# Patient Record
Sex: Male | Born: 1955 | Race: White | Hispanic: No | Marital: Married | Smoking: Former smoker
Health system: Southern US, Community
[De-identification: ages and names within clinical notes are randomized; demographics above are authoritative.]

## PROBLEM LIST (undated history)

## (undated) DIAGNOSIS — G4730 Sleep apnea, unspecified: Secondary | ICD-10-CM

## (undated) DIAGNOSIS — I10 Essential (primary) hypertension: Secondary | ICD-10-CM

## (undated) DIAGNOSIS — I2699 Other pulmonary embolism without acute cor pulmonale: Secondary | ICD-10-CM

## (undated) DIAGNOSIS — N289 Disorder of kidney and ureter, unspecified: Secondary | ICD-10-CM

## (undated) DIAGNOSIS — E785 Hyperlipidemia, unspecified: Secondary | ICD-10-CM

## (undated) DIAGNOSIS — K635 Polyp of colon: Secondary | ICD-10-CM

## (undated) DIAGNOSIS — I639 Cerebral infarction, unspecified: Secondary | ICD-10-CM

## (undated) DIAGNOSIS — I314 Cardiac tamponade: Secondary | ICD-10-CM

## (undated) DIAGNOSIS — I4510 Unspecified right bundle-branch block: Secondary | ICD-10-CM

## (undated) DIAGNOSIS — I7100 Dissection of unspecified site of aorta: Secondary | ICD-10-CM

## (undated) DIAGNOSIS — R739 Hyperglycemia, unspecified: Secondary | ICD-10-CM

## (undated) HISTORY — DX: Polyp of colon: K63.5

## (undated) HISTORY — PX: KNEE SURGERY: SHX244

## (undated) HISTORY — PX: COLONOSCOPY: SHX174

## (undated) HISTORY — DX: Cardiac tamponade: I31.4

## (undated) HISTORY — DX: Sleep apnea, unspecified: G47.30

## (undated) HISTORY — DX: Disorder of kidney and ureter, unspecified: N28.9

## (undated) HISTORY — DX: Essential (primary) hypertension: I10

## (undated) HISTORY — PX: POLYPECTOMY: SHX149

## (undated) HISTORY — PX: WISDOM TOOTH EXTRACTION: SHX21

## (undated) HISTORY — PX: CARPAL TUNNEL RELEASE: SHX101

## (undated) HISTORY — DX: Hyperglycemia, unspecified: R73.9

## (undated) HISTORY — DX: Hyperlipidemia, unspecified: E78.5

## (undated) HISTORY — DX: Cerebral infarction, unspecified: I63.9

---

## 1979-02-06 HISTORY — PX: LIGAMENT REPAIR: SHX5444

## 1998-04-29 ENCOUNTER — Other Ambulatory Visit: Admission: RE | Admit: 1998-04-29 | Discharge: 1998-04-29 | Payer: Self-pay | Admitting: Urology

## 1998-10-25 ENCOUNTER — Encounter: Payer: Self-pay | Admitting: Family Medicine

## 1998-10-25 LAB — CONVERTED CEMR LAB: Blood Glucose, Fasting: 104 mg/dL

## 2001-02-14 ENCOUNTER — Encounter: Payer: Self-pay | Admitting: Family Medicine

## 2001-02-14 LAB — CONVERTED CEMR LAB: TSH: 1.16 microintl units/mL

## 2002-02-05 HISTORY — PX: CARPAL TUNNEL RELEASE: SHX101

## 2002-08-14 ENCOUNTER — Encounter: Payer: Self-pay | Admitting: Family Medicine

## 2003-08-20 ENCOUNTER — Encounter: Payer: Self-pay | Admitting: Family Medicine

## 2003-08-21 ENCOUNTER — Encounter: Payer: Self-pay | Admitting: Family Medicine

## 2005-02-06 ENCOUNTER — Ambulatory Visit: Payer: Self-pay | Admitting: Family Medicine

## 2005-02-06 LAB — CONVERTED CEMR LAB
Blood Glucose, Fasting: 119 mg/dL
RBC count: 4.95 10*6/uL
WBC, blood: 7.9 10*3/uL

## 2005-02-09 ENCOUNTER — Ambulatory Visit: Payer: Self-pay | Admitting: Family Medicine

## 2007-02-03 ENCOUNTER — Ambulatory Visit: Payer: Self-pay | Admitting: Family Medicine

## 2007-03-28 ENCOUNTER — Ambulatory Visit: Payer: Self-pay | Admitting: Family Medicine

## 2007-03-28 LAB — CONVERTED CEMR LAB
Calcium: 9.5 mg/dL (ref 8.4–10.5)
Chloride: 104 meq/L (ref 96–112)
Creatinine, Ser: 1.2 mg/dL (ref 0.4–1.5)
GFR calc non Af Amer: 68 mL/min
Glucose, Bld: 98 mg/dL (ref 70–99)

## 2007-03-31 LAB — CONVERTED CEMR LAB
BUN: 13 mg/dL (ref 6–23)
CO2: 31 meq/L (ref 19–32)
GFR calc Af Amer: 83 mL/min
GFR calc non Af Amer: 68 mL/min
Glucose, Bld: 98 mg/dL (ref 70–99)
LDL Cholesterol: 102 mg/dL — ABNORMAL HIGH (ref 0–99)
Potassium: 4.6 meq/L (ref 3.5–5.1)
Total CHOL/HDL Ratio: 3.4
Triglycerides: 95 mg/dL (ref 0–149)

## 2007-04-01 ENCOUNTER — Ambulatory Visit: Payer: Self-pay | Admitting: Family Medicine

## 2007-05-01 ENCOUNTER — Telehealth: Payer: Self-pay | Admitting: Family Medicine

## 2007-06-09 ENCOUNTER — Ambulatory Visit: Payer: Self-pay | Admitting: Family Medicine

## 2007-06-09 LAB — CONVERTED CEMR LAB
ALT: 38 units/L (ref 0–53)
AST: 35 units/L (ref 0–37)
Bilirubin, Direct: 0.1 mg/dL (ref 0.0–0.3)
CO2: 30 meq/L (ref 19–32)
Chloride: 105 meq/L (ref 96–112)
Sodium: 139 meq/L (ref 135–145)
Total Bilirubin: 1.2 mg/dL (ref 0.3–1.2)
Total CHOL/HDL Ratio: 3.5
Triglycerides: 77 mg/dL (ref 0–149)

## 2007-06-11 ENCOUNTER — Ambulatory Visit: Payer: Self-pay | Admitting: Family Medicine

## 2007-06-11 ENCOUNTER — Encounter: Payer: Self-pay | Admitting: Family Medicine

## 2007-06-11 DIAGNOSIS — E785 Hyperlipidemia, unspecified: Secondary | ICD-10-CM

## 2007-06-11 DIAGNOSIS — L408 Other psoriasis: Secondary | ICD-10-CM | POA: Insufficient documentation

## 2007-06-11 DIAGNOSIS — R739 Hyperglycemia, unspecified: Secondary | ICD-10-CM

## 2007-06-11 DIAGNOSIS — I119 Hypertensive heart disease without heart failure: Secondary | ICD-10-CM

## 2008-03-29 ENCOUNTER — Ambulatory Visit: Payer: Self-pay | Admitting: Family Medicine

## 2008-05-11 ENCOUNTER — Ambulatory Visit: Payer: Self-pay | Admitting: Family Medicine

## 2008-07-06 ENCOUNTER — Ambulatory Visit: Payer: Self-pay | Admitting: Family Medicine

## 2008-07-06 LAB — CONVERTED CEMR LAB
BUN: 11 mg/dL (ref 6–23)
CO2: 30 meq/L (ref 19–32)
Chloride: 108 meq/L (ref 96–112)
Glucose, Bld: 118 mg/dL — ABNORMAL HIGH (ref 70–99)
Hgb A1c MFr Bld: 5.6 % (ref 4.6–6.5)
Potassium: 4.2 meq/L (ref 3.5–5.1)

## 2008-07-08 ENCOUNTER — Ambulatory Visit: Payer: Self-pay | Admitting: Family Medicine

## 2008-07-08 ENCOUNTER — Telehealth: Payer: Self-pay | Admitting: Family Medicine

## 2008-10-06 ENCOUNTER — Ambulatory Visit: Payer: Self-pay | Admitting: Family Medicine

## 2008-10-06 LAB — CONVERTED CEMR LAB
BUN: 13 mg/dL (ref 6–23)
Bilirubin, Direct: 0.1 mg/dL (ref 0.0–0.3)
Calcium: 9.3 mg/dL (ref 8.4–10.5)
Creatinine, Ser: 1.3 mg/dL (ref 0.4–1.5)
HDL: 43.1 mg/dL (ref >39.00)
LDL Cholesterol: 124 mg/dL — ABNORMAL HIGH (ref 0–99)
Microalb Creat Ratio: 8.5 mg/g (ref 0.0–30.0)
Microalb, Ur: 1.2 mg/dL (ref 0.0–1.9)
PSA: 0.74 ng/mL (ref 0.10–4.00)
TSH: 1.26 microintl units/mL (ref 0.35–5.50)
Total Bilirubin: 0.9 mg/dL (ref 0.3–1.2)
Total CHOL/HDL Ratio: 4
Total Protein: 7.1 g/dL (ref 6.0–8.3)
Triglycerides: 96 mg/dL (ref 0.0–149.0)

## 2008-10-12 ENCOUNTER — Ambulatory Visit: Payer: Self-pay | Admitting: Family Medicine

## 2009-04-12 ENCOUNTER — Ambulatory Visit: Payer: Self-pay | Admitting: Family Medicine

## 2009-09-13 ENCOUNTER — Encounter (INDEPENDENT_AMBULATORY_CARE_PROVIDER_SITE_OTHER): Payer: Self-pay | Admitting: *Deleted

## 2009-12-20 ENCOUNTER — Encounter: Payer: Self-pay | Admitting: Family Medicine

## 2010-03-07 NOTE — Letter (Signed)
Summary: Nadara Eaton letter  [facility]  [address] [place], [place] [address]   Phone: [phone]  Fax: [phone]       09/13/2009 MRN: [id number]  [facility] [address] [place], [place]  [address]  Dear Mr. Carnella [facility] [place], and [facility] announce the retirement of Arta Silence, M.D., from full-time practice at the [facility] office effective August 04, 2009 and his plans of returning part-time.  It is important to Dr. Hetty Ely and to our practice that you understand that [facility] has seven physicians in our office for your health care needs.  We will continue to offer the same exceptional care that you have today.    Dr. Hetty Ely has spoken to many of you about his plans for retirement and returning part-time in the fall.   We will continue to work with you through the transition to schedule appointments for you in the office and meet the high standards that [facility] is committed to.   Again, it is with great pleasure that we share the news that Dr. Hetty Ely will return to [facility] in October of 2011 with a reduced schedule.    If you have any questions, or would like to request an appointment with one of our physicians, please call us at [phone] and press the option for Scheduling an appointment.  We take pleasure in providing you with excellent patient care and look forward to seeing you at your next office visit.  Our [facility] Physicians are:  Tillman Abide, M.D. Laurita Quint, M.D. Roxy Manns, M.D. Kerby Nora, M.D. Hannah Beat, M.D. Ruthe Mannan, M.D. We proudly welcomed Raechel Ache, M.D. and Eustaquio Boyden, M.D. to the practice in July/August 2011.  Sincerely,  [facility]

## 2010-03-07 NOTE — Miscellaneous (Signed)
Summary: Flu vaccine   Clinical Lists Changes  Observations: Added new observation of FLU VAX: Historical (12/16/2009 8:48)      Influenza Immunization History:    Influenza # 1:  Historical (12/16/2009) Received form from [employer], [address] [place], [place]

## 2010-03-20 ENCOUNTER — Other Ambulatory Visit (INDEPENDENT_AMBULATORY_CARE_PROVIDER_SITE_OTHER): Payer: Self-pay

## 2010-03-20 ENCOUNTER — Encounter: Payer: Self-pay | Admitting: Family Medicine

## 2010-03-20 ENCOUNTER — Other Ambulatory Visit: Payer: Self-pay | Admitting: Family Medicine

## 2010-03-20 ENCOUNTER — Encounter (INDEPENDENT_AMBULATORY_CARE_PROVIDER_SITE_OTHER): Payer: Self-pay | Admitting: *Deleted

## 2010-03-20 DIAGNOSIS — Z125 Encounter for screening for malignant neoplasm of prostate: Secondary | ICD-10-CM

## 2010-03-20 DIAGNOSIS — R7309 Other abnormal glucose: Secondary | ICD-10-CM

## 2010-03-20 DIAGNOSIS — E785 Hyperlipidemia, unspecified: Secondary | ICD-10-CM

## 2010-03-20 DIAGNOSIS — I10 Essential (primary) hypertension: Secondary | ICD-10-CM

## 2010-03-20 DIAGNOSIS — M1710 Unilateral primary osteoarthritis, unspecified knee: Secondary | ICD-10-CM

## 2010-03-20 DIAGNOSIS — L408 Other psoriasis: Secondary | ICD-10-CM

## 2010-03-20 DIAGNOSIS — Z0000 Encounter for general adult medical examination without abnormal findings: Secondary | ICD-10-CM

## 2010-03-20 LAB — BASIC METABOLIC PANEL
Calcium: 9.7 mg/dL (ref 8.4–10.5)
Creatinine, Ser: 1.2 mg/dL (ref 0.4–1.5)
GFR: 68.78 mL/min (ref >60.00)
Glucose, Bld: 113 mg/dL — ABNORMAL HIGH (ref 70–99)
Sodium: 139 mEq/L (ref 135–145)

## 2010-03-20 LAB — MICROALBUMIN / CREATININE URINE RATIO: Microalb, Ur: 8 mg/dL — ABNORMAL HIGH (ref 0.0–1.9)

## 2010-03-20 LAB — LIPID PANEL
HDL: 50.1 mg/dL (ref >39.00)
LDL Cholesterol: 127 mg/dL — ABNORMAL HIGH (ref 0–99)
Total CHOL/HDL Ratio: 4
Triglycerides: 106 mg/dL (ref 0.0–149.0)
VLDL: 21.2 mg/dL (ref 0.0–40.0)

## 2010-03-20 LAB — CBC WITH DIFFERENTIAL/PLATELET
Basophils Absolute: 0 10*3/uL (ref 0.0–0.1)
Eosinophils Absolute: 0.2 10*3/uL (ref 0.0–0.7)
Hemoglobin: 14.6 g/dL (ref 13.0–17.0)
Lymphocytes Relative: 24 % (ref 12.0–46.0)
MCHC: 34.7 g/dL (ref 30.0–36.0)
Monocytes Relative: 7.2 % (ref 3.0–12.0)
Neutro Abs: 4.3 10*3/uL (ref 1.4–7.7)
Neutrophils Relative %: 64.8 % (ref 43.0–77.0)
Platelets: 191 10*3/uL (ref 150.0–400.0)
RDW: 13.1 % (ref 11.5–14.6)

## 2010-03-20 LAB — HEPATIC FUNCTION PANEL
AST: 22 U/L (ref 0–37)
Alkaline Phosphatase: 54 U/L (ref 39–117)
Bilirubin, Direct: 0.1 mg/dL (ref 0.0–0.3)
Total Bilirubin: 0.7 mg/dL (ref 0.3–1.2)

## 2010-03-21 LAB — CONVERTED CEMR LAB: Vit D, 25-Hydroxy: 34 ng/mL (ref 30–89)

## 2010-03-22 ENCOUNTER — Encounter: Payer: Self-pay | Admitting: Family Medicine

## 2010-03-22 ENCOUNTER — Encounter (INDEPENDENT_AMBULATORY_CARE_PROVIDER_SITE_OTHER): Payer: Managed Care, Other (non HMO) | Admitting: Family Medicine

## 2010-03-22 ENCOUNTER — Encounter (INDEPENDENT_AMBULATORY_CARE_PROVIDER_SITE_OTHER): Payer: Self-pay | Admitting: *Deleted

## 2010-03-22 DIAGNOSIS — Z0000 Encounter for general adult medical examination without abnormal findings: Secondary | ICD-10-CM

## 2010-03-29 NOTE — Assessment & Plan Note (Signed)
Summary: CPX/ CLE   Vital Signs:  Patient profile:   55 year old male Weight:      230.75 pounds BMI:     36.00 Temp:     98.8 degrees F oral Pulse rate:   64 / minute Pulse rhythm:   regular BP sitting:   144 / 84  (left arm) Cuff size:   large  Vitals Entered By: Sydell Axon LPN (March 22, 2010 11:11 AM) CC: 30 minute checkup, wants to discuss getting a colonoscopy, Headache   History of Present Illness: Pt here for Comp Exam, feels well with no complaint.  Libido dropping off. He is good in the AM, not so good at night. his brother was recently diagnosed with polyps of the colon. He started weight watcher's at the bgiining of the year and had lost weight but gained it back. He also has heartburn once or twice a week, especially with bending over. He takes Co Q 10, Fishoil and red yeast rice. The day before his bloodwork, he had had poirk 2 days in a row.    Preventive Screening-Counseling & Management  Alcohol-Tobacco     Alcohol drinks/day: 1 average but typically every 2-3 days.     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 1 Tablet  Twice A Day By Mouth 2)  Amlodipine Besylate 10 Mg Tabs (Amlodipine Besylate) .... Take 1/2 Tablet By Mouth At Night 3)  Diprolene 0.05 % Oint (Betamethasone Dipropionate Aug) .... Apply To Area Two Times A Day As Needed For Psoriasis  Current Medications (verified): 1)  Metoprolol Tartrate 100 Mg Tabs (Metoprolol Tartrate) .Marland Kitchen.. 1 Tablet Twice A Day By Mouth 2)  Amlodipine Besylate 10 Mg Tabs (Amlodipine Besylate) .... Take 1/2 Tablet By Mouth At Night 3)  Diprolene 0.05 % Oint (Betamethasone Dipropionate Aug) .... Apply To Area Two Times A Day As Needed For Psoriasis 4)  Cialis 10 Mg Tabs (Tadalafil) .... One Tab By Mouth Max of Once A Day For Erectile Dyfunction  Allergies: No Known Drug Allergies  Family History: Father dec 67 Leukemia AML CABG HTN Mother A 58  Parkinsons Brother A 54 HTN CABG Polyps Brother A 51  HTN Brother A 48 HTN Sister A 46 Meniere's CV: +PGF DECEASED FROM MI // FATHER CABG ANGIOPLASTY HBP: + FATHER, BROTHER, SELF DM : NEGATIVE GOUT/ARTHRITIS  PROSTATE CANCER: +LEUKEMIA FATHER BREAST/OVARIAN/UTERINE CANCER: NEGATIVE COLON CANCER: None, polyps in brother DEPRESSION:NEGATIVE ETOH/DRUG ABUSE: NEGATIVE OTHER : +STROKE PGM  Social History: Reviewed history from 06/11/2007 and no changes required.  Occupation:[employer] Married lives with wife  2 stepchildren Does Patient Exercise:  no  Review of Systems General:  Denies chills, fatigue, fever, sweats, weakness, and weight loss. Eyes:  Denies blurring, discharge, and eye pain. ENT:  Denies decreased hearing, ear discharge, earache, and ringing in ears. CV:  Complains of swelling of feet; denies chest pain or discomfort, fainting, fatigue, palpitations, shortness of breath with exertion, and swelling of hands; occas, better since cutting back on Amlodipine.Marland Kitchen Resp:  Denies cough, shortness of breath, and wheezing. GI:  Complains of indigestion; denies abdominal pain, bloody stools, change in bowel  habits, constipation, dark tarry stools, diarrhea, loss of appetite, nausea, vomiting, vomiting blood, and yellowish skin color; occas, per HPI. GU:  Denies discharge, dysuria, nocturia, and urinary frequency. MS:  Complains of joint pain; denies low back pain, muscle aches, cramps, and stiffness. Derm:  Denies dryness, itching, and rash; psoriasis better.. Neuro:  Denies numbness, poor balance, tingling, and tremors.  Physical Exam  General:  Well-developed,well-nourished,in no acute distress; alert,appropriate and cooperative throughout examination, mildly obese. Head:  Normocephalic and atraumatic without obvious abnormalities. No apparent alopecia, but has male pattern  balding. Eyes:  Conjunctiva clear bilaterally.  Ears:  External ear exam shows no significant lesions or deformities.  Otoscopic examination reveals clear canals, tympanic membranes are intact bilaterally without bulging, retraction, inflammation or discharge. Hearing is grossly normal bilaterally. Nose:  External nasal examination shows no deformity or inflammation. Nasal mucosa are pink and moist without lesions or exudates. Mouth:  Oral mucosa and oropharynx without lesions or exudates.  Teeth in good repair. Neck:  No deformities, masses, or tenderness noted. Chest Wall:  No deformities, masses, tenderness or gynecomastia noted. Breasts:  No masses or gynecomastia noted Lungs:  Normal respiratory effort, chest expands symmetrically. Lungs are clear to auscultation, no crackles or wheezes. Heart:  Normal rate and regular rhythm. S1 and S2 normal without gallop, murmur, click, rub or other extra sounds. Abdomen:  Bowel sounds positive,abdomen soft and non-tender without masses, organomegaly or hernias noted. Rectal:  No external abnormalities noted. Normal sphincter tone. No rectal masses or tenderness. G neg. Genitalia:  Testes bilaterally descended without nodularity, tenderness or masses. No scrotal masses or lesions. No  penis lesions or urethral discharge. Prostate:  Prostate gland firm and smooth, no enlargement, nodularity, tenderness, mass, asymmetry or induration. 10 gms. Msk:  No deformity or scoliosis noted of thoracic or lumbar spine.   Pulses:  R and L carotid,radial,femoral,dorsalis pedis and posterior tibial pulses are full and equal bilaterally Extremities:  No clubbing, cyanosis, edema, or deformity noted with normal full range of motion of all joints. Neurologic:  No cranial nerve deficits noted. Station and gait are normal. Sensory, motor and coordinative functions appear intact. Skin:  Intact without suspicious lesions or rashes Cervical Nodes:  No lymphadenopathy noted Inguinal Nodes:  No significant adenopathy Psych:  Cognition and judgment appear intact. Alert and cooperative with normal attention span and concentration. No apparent delusions, illusions, hallucinations   Impression & Recommendations:  Problem # 1:  HEALTH MAINTENANCE EXAM (ICD-V70.0) Assessment Comment Only  Orders: Gastroenterology Referral (GI)  Reviewed preventive care protocols, scheduled due services, and updated immunizations.  Problem # 2:  SPECIAL SCREENING MALIGNANT NEOPLASM OF PROSTATE (ICD-V76.44) Assessment: Unchanged Stable PSA and exam.  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  Problem # 4:  HYPERGLYCEMIA (ICD-790.29) Assessment: Unchanged Slightly higher but still controlled. Discussed avoiding sweets and carbs to avoid DM.  Weight loss will prob normalize if enough lost. Labs Reviewed: Creat: 1.2 (03/20/2010)     Problem # 5:  HYPERLIPIDEMIA (ICD-272.4) Assessment: Unchanged Stable. Labs Reviewed: SGOT: 22 (03/20/2010)   SGPT: 38 (03/20/2010)   HDL:50.10 (03/20/2010), 43.10 (10/06/2008)  LDL:127 (03/20/2010), 124 (10/06/2008)  Chol:198 (03/20/2010), 186 (10/06/2008)  Trig:106.0 (03/20/2010), 96.0 (10/06/2008)  Problem # 6:  PSORIASIS  (ICD-696.1) Assessment: Improved Better with Fish Oil.  Problem # 7:  ESSENTIAL HYPERTENSION, BENIGN (ICD-401.1) Assessment: Deteriorated Slightly high today....weight loss will normalize. See me in 3 mos if not successful at losing weight for BP check. His updated medication list for this problem includes:    Metoprolol Tartrate 100 Mg Tabs (Metoprolol tartrate) .Marland Kitchen... 1 tablet twice a day by mouth    Amlodipine Besylate 10 Mg Tabs (Amlodipine besylate) .Marland Kitchen... Take 1/2 tablet by mouth at night  BP today: 144/84 Prior BP: 130/78 (10/12/2008)  Labs Reviewed: K+: 4.4 (03/20/2010) Creat: : 1.2 (03/20/2010)   Chol: 198 (03/20/2010)   HDL: 50.10 (03/20/2010)   LDL: 127 (03/20/2010)   TG: 106.0 (03/20/2010)  Complete Medication List: 1)  Metoprolol Tartrate 100 Mg Tabs (Metoprolol tartrate) .Marland Kitchen.. 1 tablet twice a day by mouth 2)  Amlodipine Besylate 10 Mg Tabs (Amlodipine besylate) .... Take 1/2 tablet by mouth at night 3)  Diprolene 0.05 % Oint (Betamethasone dipropionate aug) .... Apply to area two times a day as needed for psoriasis 4)  Cialis 10 Mg Tabs (Tadalafil) .... One tab by mouth max of once a day for erectile dyfunction  Patient Instructions: 1)  Refer for colonoscopy. 2)  Use jogging in a jug.  3)  RTC for BP check if not successful at losing weight. Prescriptions: CIALIS 10 MG TABS (TADALAFIL) one tab by mouth max of once a day for erectile dyfunction  #30 x 0   Entered and Authorized by:   Shaune Leeks MD   Signed by:   Shaune Leeks MD on 03/22/2010   Method used:   Electronically to        [facility]  [address] # [id number]* (retail)       [address]       [place], [place]  [address]       Ph: [phone] or [phone]       Fax: [phone]   RxID:   [id number]    Orders Added: 1)  Gastroenterology Referral [GI] 2)  Est. Patient 40-64 years [id number]

## 2010-03-29 NOTE — Letter (Signed)
Summary: Pre Visit Letter Revised  [facility]  [address] [place], [place] [address]   Phone: [phone]  Fax: [phone]        03/22/2010 MRN: [id number] [facility] [address] [place], [place]  [address]  [place]             Procedure Date:  May 04, 2010   dir col-Dr Dalene Carrow to the Gastroenterology Division at [facility].    You are scheduled to see a nurse for your pre-procedure visit on April 21, 2010 at 3:30pm on the 3rd floor at [facility], [address]  We ask that you try to arrive at our office 15 minutes prior to your appointment time to allow for check-in.  Please take a minute to review the attached form.  If you answer "Yes" to one or more of the questions on the first page, we ask that you call the person listed at your earliest opportunity.  If you answer "No" to all of the questions, please complete the rest of the form and bring it to your appointment.    Your nurse visit will consist of discussing your medical and surgical history, your immediate family medical history, and your medications.   If you are unable to list all of your medications on the form, please bring the medication bottles to your appointment and we will list them.  We will need to be aware of both prescribed and over the counter drugs.  We will need to know exact dosage information as well.    Please be prepared to read and sign documents such as consent forms, a financial agreement, and acknowledgement forms.  If necessary, and with your consent, a friend or relative is welcome to sit-in on the nurse visit with you.  Please bring your insurance card so that we may make a copy of it.  If your insurance requires a referral to see a specialist, please bring your referral form from your primary care physician.  No co-pay is required for this nurse visit.     If you cannot keep your appointment, please call [phone] to cancel or reschedule  prior to your appointment date.  This allows [place] the opportunity to schedule an appointment for another patient in need of care.    Thank you for choosing [facility] for your medical needs.  We appreciate the opportunity to care for you.  Please visit [place] at our website  to learn more about our practice.  Sincerely, The Gastroenterology Division

## 2010-04-19 ENCOUNTER — Encounter: Payer: Self-pay | Admitting: *Deleted

## 2010-04-21 ENCOUNTER — Encounter: Payer: Self-pay | Admitting: Internal Medicine

## 2010-04-25 NOTE — Miscellaneous (Signed)
Summary: [facility]  Clinical Lists Changes  Medications: Added new medication of MOVIPREP 100 GM  SOLR (PEG-KCL-NACL-NASULF-NA ASC-C) As per prep instructions. - Signed Rx of MOVIPREP 100 GM  SOLR (PEG-KCL-NACL-NASULF-NA ASC-C) As per prep instructions.;  #1 x 0;  Signed;  Entered by: Ezra Sites RN;  Authorized by: Hart Carwin MD;  Method used: Electronically to [facility]  [address] # [id number]*, [address] [place], [place], [place]  [address], Ph: [phone] or [phone], Fax: [phone] Observations: Added new observation of NKA: T (04/21/2010 15:20)    Prescriptions: MOVIPREP 100 GM  SOLR (PEG-KCL-NACL-NASULF-NA ASC-C) As per prep instructions.  #1 x 0   Entered by:   Ezra Sites RN   Authorized by:   Hart Carwin MD   Signed by:   Ezra Sites RN on 04/21/2010   Method used:   Electronically to        [facility] [address] # [id number]* (retail)       [address]       [place], [place]  [address]       Ph: [phone] or [phone]       Fax: [phone]   RxID:   [id number]

## 2010-04-25 NOTE — Letter (Signed)
Summary: [facility] Instructions  [facility]  [address] [place], [place] [address]   Phone: [phone]  Fax: [phone]       Gabriel Tran    1955/08/11    MRN: [id number]        Procedure Day /Date: Thursday 05/04/10     Arrival Time: 10:00 am      Procedure Time:  11:00 am     Location of Procedure:                    _X_  [facility] (4th Floor)  PREPARATION FOR COLONOSCOPY WITH MOVIPREP   Starting 5 days prior to your procedure _Saturday 04/29/10 _ do not eat nuts, seeds, popcorn, corn, beans, peas,  salads, or any raw vegetables.  Do not take any fiber supplements (e.g. Metamucil, Citrucel, and Benefiber).  THE DAY BEFORE YOUR PROCEDURE         DATE: _3/28/12  DAY: _Wednesday  _  1.  Drink clear liquids the entire day-NO SOLID FOOD  2.  Do not drink anything colored red or purple.  Avoid juices with pulp.  No orange juice.  3.  Drink at least 64 oz. (8 glasses) of fluid/clear liquids during the day to prevent dehydration and help the prep work efficiently.  CLEAR LIQUIDS INCLUDE: Water Jello Ice Popsicles Tea (sugar ok, no milk/cream) Powdered fruit flavored drinks Coffee (sugar ok, no milk/cream) Gatorade Juice: apple, white grape, white cranberry  Lemonade Clear bullion, consomm, broth Carbonated beverages (any kind) Strained chicken noodle soup Hard Candy                             4.  In the morning, mix first dose of MoviPrep solution:    Empty 1 Pouch A and 1 Pouch B into the disposable container    Add lukewarm drinking water to the top line of the container. Mix to dissolve    Refrigerate (mixed solution should be used within 24 hrs)  5.  Begin drinking the prep at 5:00 p.m. The MoviPrep container is divided by 4 marks.   Every 15 minutes drink the solution down to the next mark (approximately 8 oz) until the full liter is complete.   6.  Follow completed prep with 16 oz of clear liquid of your choice  (Nothing red or purple).  Continue to drink clear liquids until bedtime.  7.  Before going to bed, mix second dose of MoviPrep solution:    Empty 1 Pouch A and 1 Pouch B into the disposable container    Add lukewarm drinking water to the top line of the container. Mix to dissolve    Refrigerate  THE DAY OF YOUR PROCEDURE      DATE: _3/29/12  _ DAY: _Thursday  _  Beginning at _6:00  a.m. (5 hours before procedure):         1. Every 15 minutes, drink the solution down to the next mark (approx 8 oz) until the full liter is complete.  2. Follow completed prep with 16 oz. of clear liquid of your choice.    3. You may drink clear liquids until 9:00 am  (2 HOURS BEFORE PROCEDURE).   MEDICATION INSTRUCTIONS  Unless otherwise instructed, you should take regular prescription medications with a small sip of water   as early as possible the morning of your procedure.  OTHER INSTRUCTIONS  You will need a responsible adult at least 53 years of age to accompany you and drive you home.   This person must remain in the waiting room during your procedure.  Wear loose fitting clothing that is easily removed.  Leave jewelry and other valuables at home.  However, you may wish to bring a book to read or  an iPod/MP3 player to listen to music as you wait for your procedure to start.  Remove all body piercing jewelry and leave at home.  Total time from sign-in until discharge is approximately 2-3 hours.  You should go home directly after your procedure and rest.  You can resume normal activities the  day after your procedure.  The day of your procedure you should not:   Drive   Make legal decisions   Operate machinery   Drink alcohol   Return to work  You will receive specific instructions about eating, activities and medications before you leave.    The above instructions have been reviewed and explained to me by   Ezra Sites RN  April 21, 2010 3:43 PM     I  fully understand and can verbalize these instructions _____________________________ Date _________

## 2010-05-03 ENCOUNTER — Encounter: Payer: Self-pay | Admitting: Internal Medicine

## 2010-05-04 ENCOUNTER — Ambulatory Visit (AMBULATORY_SURGERY_CENTER): Payer: Managed Care, Other (non HMO) | Admitting: Internal Medicine

## 2010-05-04 ENCOUNTER — Encounter: Payer: Self-pay | Admitting: Internal Medicine

## 2010-05-04 VITALS — BP 148/96 | HR 56 | Temp 98.0°F | Resp 18 | Ht 67.0 in | Wt 222.0 lb

## 2010-05-04 DIAGNOSIS — K5730 Diverticulosis of large intestine without perforation or abscess without bleeding: Secondary | ICD-10-CM

## 2010-05-04 DIAGNOSIS — Z1211 Encounter for screening for malignant neoplasm of colon: Secondary | ICD-10-CM

## 2010-05-04 DIAGNOSIS — D126 Benign neoplasm of colon, unspecified: Secondary | ICD-10-CM

## 2010-05-04 NOTE — Progress Notes (Signed)
Pressure applied to abdomen to reach cecum.  

## 2010-05-04 NOTE — Patient Instructions (Addendum)
Please review Patient Discharge instruction Sheet and Patient teaching(green sheet). Dr.Brodie removed 8 polyps,you will receive results in mail in about 2-3 weeks. Please read over patient education sheet given on colon polyps. Mild diverticulosis seen, read over education sheet given and also High fiber diet handout. Resume your daily medications. No aspirin,advil or arthritis medications for 2 weeks per Dr.Brodie.

## 2010-05-05 ENCOUNTER — Telehealth: Payer: Self-pay | Admitting: *Deleted

## 2010-05-05 NOTE — Telephone Encounter (Signed)

## 2010-05-09 ENCOUNTER — Encounter: Payer: Self-pay | Admitting: Internal Medicine

## 2010-05-09 NOTE — Procedures (Signed)
Summary: Colonoscopy  Patient: [name] Note: All result statuses are Final unless otherwise noted.  Tests: (1) Colonoscopy (COL)   COL Colonoscopy           DONE     [facility]     [address]     [place], [place]  [address]          COLONOSCOPY PROCEDURE REPORT          PATIENT:  Gabriel Tran, Gabriel Tran  MR#:  [id number]     BIRTHDATE:  1955-07-09, 54 yrs. old  GENDER:  male     ENDOSCOPIST:  Hedwig Morton. Juanda Chance, MD     REF. BY:  Laurita Quint, M.D.     PROCEDURE DATE:  05/04/2010     PROCEDURE:  Colonoscopy 19147     ASA CLASS:  Class I     INDICATIONS:  Routine Risk Screening     MEDICATIONS:   Versed 8 mg, Fentanyl 75 mcg          DESCRIPTION OF PROCEDURE:   After the risks benefits and     alternatives of the procedure were thoroughly explained, informed     consent was obtained.  Digital rectal exam was performed and     revealed no rectal masses.   The [id number] endoscope was     introduced through the anus and advanced to the cecum, which was     identified by both the appendix and ileocecal valve, without     limitations.  The quality of the prep was excellent, using     MoviPrep.  The instrument was then slowly withdrawn as the colon     was fully examined.     <<PROCEDUREIMAGES>>          FINDINGS:  There were multiple polyps identified and removed.     throughout the colon. about 8 polyps ranges 3mm to 8 mm The polyps     were removed using cold biopsy forceps. Polyps were snared without     cautery. Retrieval was successful. snare polyp Polyp was snared,     then cauterized with monopolar cautery. Retrieval was successful     (see image3, image4, image5, image8, image10, image11, image12,     image15, image14, and image13). snare polyp  Mild diverticulosis     was found in the descending colon (see image2 and image1).  This     was otherwise a normal examination of the colon (see image6 and     image7).   Retroflexed views in the rectum  revealed no     abnormalities.    The scope was then withdrawn from the patient     and the procedure completed.          COMPLICATIONS:  None     ENDOSCOPIC IMPRESSION:     1) Polyps, multiple throughout the colon     2) Mild diverticulosis in the descending colon     3) Otherwise normal examination     s/p multiple polypectomies     RECOMMENDATIONS:     1) Await pathology results     2) High fiber diet.     REPEAT EXAM:  In 3 - 5 year(s) for.  recall will depend on polyp     pathology          ______________________________     Hedwig Morton. Juanda Chance, MD          CC:  n.     eSIGNED:   Hedwig Morton. Mckayla Mulcahey at 05/04/2010 11:46 AM          Jacqlyn Larsen, [id number]  Note: An exclamation mark (!) indicates a result that was not dispersed into the flowsheet. Document Creation Date: 05/04/2010 11:47 AM _______________________________________________________________________  (1) Order result status: Final Collection or observation date-time: 05/04/2010 11:33 Requested date-time:  Receipt date-time:  Reported date-time:  Referring Physician:   Ordering Physician: Lina Sar [phone]) Specimen Source:  Source: Launa Grill Order Number: [id number] Lab site:

## 2011-02-05 ENCOUNTER — Other Ambulatory Visit: Payer: Self-pay | Admitting: Family Medicine

## 2011-02-06 DIAGNOSIS — I314 Cardiac tamponade: Secondary | ICD-10-CM

## 2011-02-06 HISTORY — DX: Cardiac tamponade: I31.4

## 2011-05-11 ENCOUNTER — Other Ambulatory Visit: Payer: Self-pay | Admitting: Family Medicine

## 2011-05-14 ENCOUNTER — Other Ambulatory Visit: Payer: Self-pay | Admitting: *Deleted

## 2011-05-14 NOTE — Telephone Encounter (Signed)
Pt is needing Amlodipine he is completely out of those and he is needing Metroplalol. He uses [facility] on [address]

## 2011-05-14 NOTE — Telephone Encounter (Signed)
Patient has been warned with the last two refills that he needs to make an appointment.  Please advise.

## 2011-05-15 MED ORDER — AMLODIPINE BESYLATE 10 MG PO TABS: 5.0000 mg | ORAL_TABLET | Freq: Every day | ORAL | Status: DC

## 2011-05-15 MED ORDER — METOPROLOL TARTRATE 100 MG PO TABS: ORAL_TABLET | ORAL | Status: DC

## 2011-05-15 NOTE — Telephone Encounter (Signed)
Sent. Needs to keep OV next month.

## 2011-06-18 ENCOUNTER — Other Ambulatory Visit: Payer: Managed Care, Other (non HMO)

## 2011-06-20 ENCOUNTER — Encounter: Payer: Managed Care, Other (non HMO) | Admitting: Family Medicine

## 2011-08-10 ENCOUNTER — Other Ambulatory Visit: Payer: Self-pay

## 2011-08-10 ENCOUNTER — Other Ambulatory Visit: Payer: Self-pay | Admitting: Family Medicine

## 2011-08-10 NOTE — Telephone Encounter (Signed)
Pt left v/m verifying metoprolol and amlodipine were refilled to [facility] [address]. Left pt v.m had been done. Spoke with [facility] and pt has already picked up med.

## 2011-08-27 ENCOUNTER — Other Ambulatory Visit: Payer: Self-pay | Admitting: Family Medicine

## 2011-08-27 DIAGNOSIS — R739 Hyperglycemia, unspecified: Secondary | ICD-10-CM

## 2011-08-27 DIAGNOSIS — Z125 Encounter for screening for malignant neoplasm of prostate: Secondary | ICD-10-CM

## 2011-08-31 ENCOUNTER — Other Ambulatory Visit (INDEPENDENT_AMBULATORY_CARE_PROVIDER_SITE_OTHER): Payer: Managed Care, Other (non HMO)

## 2011-08-31 DIAGNOSIS — R739 Hyperglycemia, unspecified: Secondary | ICD-10-CM

## 2011-08-31 DIAGNOSIS — Z125 Encounter for screening for malignant neoplasm of prostate: Secondary | ICD-10-CM

## 2011-08-31 DIAGNOSIS — R7309 Other abnormal glucose: Secondary | ICD-10-CM

## 2011-08-31 LAB — COMPREHENSIVE METABOLIC PANEL
ALT: 29 U/L (ref 0–53)
AST: 26 U/L (ref 0–37)
Creatinine, Ser: 1.2 mg/dL (ref 0.4–1.5)
Sodium: 137 mEq/L (ref 135–145)
Total Bilirubin: 0.8 mg/dL (ref 0.3–1.2)
Total Protein: 7 g/dL (ref 6.0–8.3)

## 2011-08-31 LAB — LIPID PANEL
Total CHOL/HDL Ratio: 4
VLDL: 15.4 mg/dL (ref 0.0–40.0)

## 2011-08-31 LAB — MICROALBUMIN / CREATININE URINE RATIO
Creatinine,U: 152.5 mg/dL
Microalb Creat Ratio: 1.8 mg/g (ref 0.0–30.0)

## 2011-08-31 LAB — LDL CHOLESTEROL, DIRECT: Direct LDL: 142.8 mg/dL

## 2011-09-06 ENCOUNTER — Encounter: Payer: Self-pay | Admitting: Family Medicine

## 2011-09-06 ENCOUNTER — Ambulatory Visit (INDEPENDENT_AMBULATORY_CARE_PROVIDER_SITE_OTHER): Payer: Managed Care, Other (non HMO) | Admitting: Family Medicine

## 2011-09-06 VITALS — BP 130/86 | HR 56 | Temp 99.0°F | Wt 237.0 lb

## 2011-09-06 DIAGNOSIS — I10 Essential (primary) hypertension: Secondary | ICD-10-CM

## 2011-09-06 DIAGNOSIS — Z0000 Encounter for general adult medical examination without abnormal findings: Secondary | ICD-10-CM

## 2011-09-06 DIAGNOSIS — R7309 Other abnormal glucose: Secondary | ICD-10-CM

## 2011-09-06 DIAGNOSIS — L408 Other psoriasis: Secondary | ICD-10-CM

## 2011-09-06 DIAGNOSIS — E785 Hyperlipidemia, unspecified: Secondary | ICD-10-CM

## 2011-09-06 DIAGNOSIS — M549 Dorsalgia, unspecified: Secondary | ICD-10-CM

## 2011-09-06 MED ORDER — METOPROLOL TARTRATE 100 MG PO TABS: 100.0000 mg | ORAL_TABLET | Freq: Two times a day (BID) | ORAL | Status: DC

## 2011-09-06 MED ORDER — IBUPROFEN 800 MG PO TABS: 800.0000 mg | ORAL_TABLET | Freq: Three times a day (TID) | ORAL | Status: AC | PRN

## 2011-09-06 MED ORDER — AMLODIPINE BESYLATE 5 MG PO TABS: 5.0000 mg | ORAL_TABLET | Freq: Every day | ORAL | Status: DC

## 2011-09-06 MED ORDER — BETAMETHASONE DIPROPIONATE AUG 0.05 % EX OINT: TOPICAL_OINTMENT | Freq: Two times a day (BID) | CUTANEOUS | Status: DC

## 2011-09-06 NOTE — Progress Notes (Signed)
CPE- See plan.  Routine anticipatory guidance given to patient.  See health maintenance. Flu shot done yearly.   Tetanus 2007 Colonoscopy done in 2012 per Dr. Juanda Chance.   PSA wnl.   Advance directive.  D/w pt.  He doesn't have a living will.  Encouraged.    Occ back pain after yard work, had used ibuprofen prev, would liek refill.   Hypertension:    Using medication without problems or lightheadedness: yes Chest pain with exertion: no Edema:no Short of breath:no  Psoriasis, controlled with diprolene ointment.   Hyperglycemia.  126 on recheck check.  Drinks beer.  Weight is up from last CPE.  Sedentary job with irregular schedule.  D/w pt about DM2 education through ADA website.    PMH and SH reviewed  Meds, vitals, and allergies reviewed.   ROS: See HPI.  Otherwise negative.    GEN: nad, alert and oriented HEENT: mucous membranes moist NECK: supple w/o LA CV: rrr. PULM: ctab, no inc wob ABD: soft, +bs EXT: no edema SKIN: no acute rash but mild psoriatic changes noted on the shins

## 2011-09-06 NOTE — Patient Instructions (Addendum)
Check your sugar for a few mornings, fasting.  Send me the results.   Look at the diabetes.org and start reading about type 2 diabetes.   I would get a flu shot each fall.   Cut back on beer and exercise more.  Take care.

## 2011-09-07 DIAGNOSIS — M549 Dorsalgia, unspecified: Secondary | ICD-10-CM | POA: Insufficient documentation

## 2011-09-07 DIAGNOSIS — Z0000 Encounter for general adult medical examination without abnormal findings: Secondary | ICD-10-CM | POA: Insufficient documentation

## 2011-09-07 NOTE — Assessment & Plan Note (Signed)
Needs to lose weight, exercise, cut out beer. This is up to him to proceed with daily effort. He is aware of that.

## 2011-09-07 NOTE — Assessment & Plan Note (Signed)
Routine anticipatory guidance given to patient. See health maintenance.  Flu shot done yearly.  Tetanus 2007  Colonoscopy done in 2012 per Dr. Juanda Chance.  PSA wnl.  Advance directive. D/w pt. He doesn't have a living will. Encouraged.

## 2011-09-07 NOTE — Assessment & Plan Note (Signed)
None today, can use ibuprofen sparingly with NSAID cautions.

## 2011-09-07 NOTE — Assessment & Plan Note (Signed)
Continue current meds.  Needs to lose weight, exercise, cut out beer. This is up to him to proceed with daily effort. He is aware of that.

## 2011-09-07 NOTE — Assessment & Plan Note (Signed)
He likely has DM2 or is very close to developing DM2.  D/w pt in detail.  Needs to lose weight, exercise, cut out beer.  This is up to him to proceed with daily effort.  He is aware of that.  Recheck sugar at home, wife has meter.  Notify me of readings.

## 2011-09-07 NOTE — Assessment & Plan Note (Signed)
Continue diprolene prn with topical steroid cautions.

## 2011-10-29 ENCOUNTER — Inpatient Hospital Stay (HOSPITAL_COMMUNITY)
Admission: EM | Admit: 2011-10-29 | Discharge: 2011-11-16 | DRG: 216 | Disposition: A | Discharge status: Rehabilitation Facility | Payer: PRIVATE HEALTH INSURANCE | Attending: Cardiothoracic Surgery | Admitting: Cardiothoracic Surgery

## 2011-10-29 ENCOUNTER — Inpatient Hospital Stay (HOSPITAL_COMMUNITY): Payer: PRIVATE HEALTH INSURANCE

## 2011-10-29 ENCOUNTER — Other Ambulatory Visit: Payer: Self-pay

## 2011-10-29 ENCOUNTER — Emergency Department (HOSPITAL_COMMUNITY): Payer: PRIVATE HEALTH INSURANCE

## 2011-10-29 ENCOUNTER — Encounter (HOSPITAL_COMMUNITY): Payer: Self-pay | Admitting: *Deleted

## 2011-10-29 DIAGNOSIS — E119 Type 2 diabetes mellitus without complications: Secondary | ICD-10-CM | POA: Diagnosis present

## 2011-10-29 DIAGNOSIS — I10 Essential (primary) hypertension: Secondary | ICD-10-CM | POA: Diagnosis present

## 2011-10-29 DIAGNOSIS — R570 Cardiogenic shock: Secondary | ICD-10-CM | POA: Diagnosis not present

## 2011-10-29 DIAGNOSIS — I7101 Dissection of thoracic aorta: Principal | ICD-10-CM | POA: Diagnosis present

## 2011-10-29 DIAGNOSIS — I2699 Other pulmonary embolism without acute cor pulmonale: Secondary | ICD-10-CM | POA: Diagnosis not present

## 2011-10-29 DIAGNOSIS — J90 Pleural effusion, not elsewhere classified: Secondary | ICD-10-CM | POA: Diagnosis not present

## 2011-10-29 DIAGNOSIS — D696 Thrombocytopenia, unspecified: Secondary | ICD-10-CM | POA: Diagnosis present

## 2011-10-29 DIAGNOSIS — I4891 Unspecified atrial fibrillation: Secondary | ICD-10-CM

## 2011-10-29 DIAGNOSIS — I129 Hypertensive chronic kidney disease with stage 1 through stage 4 chronic kidney disease, or unspecified chronic kidney disease: Secondary | ICD-10-CM | POA: Diagnosis present

## 2011-10-29 DIAGNOSIS — Z6837 Body mass index (BMI) 37.0-37.9, adult: Secondary | ICD-10-CM

## 2011-10-29 DIAGNOSIS — E872 Acidosis, unspecified: Secondary | ICD-10-CM | POA: Diagnosis not present

## 2011-10-29 DIAGNOSIS — T50995A Adverse effect of other drugs, medicaments and biological substances, initial encounter: Secondary | ICD-10-CM | POA: Diagnosis not present

## 2011-10-29 DIAGNOSIS — N170 Acute kidney failure with tubular necrosis: Secondary | ICD-10-CM | POA: Diagnosis not present

## 2011-10-29 DIAGNOSIS — Z8249 Family history of ischemic heart disease and other diseases of the circulatory system: Secondary | ICD-10-CM

## 2011-10-29 DIAGNOSIS — J155 Pneumonia due to Escherichia coli: Secondary | ICD-10-CM | POA: Diagnosis not present

## 2011-10-29 DIAGNOSIS — E669 Obesity, unspecified: Secondary | ICD-10-CM | POA: Diagnosis present

## 2011-10-29 DIAGNOSIS — R51 Headache: Secondary | ICD-10-CM

## 2011-10-29 DIAGNOSIS — J9819 Other pulmonary collapse: Secondary | ICD-10-CM | POA: Diagnosis not present

## 2011-10-29 DIAGNOSIS — I498 Other specified cardiac arrhythmias: Secondary | ICD-10-CM | POA: Diagnosis present

## 2011-10-29 DIAGNOSIS — Z87891 Personal history of nicotine dependence: Secondary | ICD-10-CM

## 2011-10-29 DIAGNOSIS — I469 Cardiac arrest, cause unspecified: Secondary | ICD-10-CM | POA: Diagnosis not present

## 2011-10-29 DIAGNOSIS — E8779 Other fluid overload: Secondary | ICD-10-CM | POA: Diagnosis not present

## 2011-10-29 DIAGNOSIS — Z952 Presence of prosthetic heart valve: Secondary | ICD-10-CM

## 2011-10-29 DIAGNOSIS — I7100 Dissection of unspecified site of aorta: Secondary | ICD-10-CM

## 2011-10-29 DIAGNOSIS — R0902 Hypoxemia: Secondary | ICD-10-CM

## 2011-10-29 DIAGNOSIS — I4510 Unspecified right bundle-branch block: Secondary | ICD-10-CM | POA: Diagnosis present

## 2011-10-29 DIAGNOSIS — I519 Heart disease, unspecified: Secondary | ICD-10-CM | POA: Diagnosis not present

## 2011-10-29 DIAGNOSIS — N182 Chronic kidney disease, stage 2 (mild): Secondary | ICD-10-CM | POA: Diagnosis present

## 2011-10-29 DIAGNOSIS — J988 Other specified respiratory disorders: Secondary | ICD-10-CM | POA: Diagnosis not present

## 2011-10-29 DIAGNOSIS — I71019 Dissection of thoracic aorta, unspecified: Principal | ICD-10-CM | POA: Diagnosis present

## 2011-10-29 DIAGNOSIS — I517 Cardiomegaly: Secondary | ICD-10-CM | POA: Diagnosis present

## 2011-10-29 DIAGNOSIS — I314 Cardiac tamponade: Secondary | ICD-10-CM | POA: Diagnosis not present

## 2011-10-29 DIAGNOSIS — D72829 Elevated white blood cell count, unspecified: Secondary | ICD-10-CM

## 2011-10-29 DIAGNOSIS — I4892 Unspecified atrial flutter: Secondary | ICD-10-CM | POA: Diagnosis not present

## 2011-10-29 DIAGNOSIS — Z79899 Other long term (current) drug therapy: Secondary | ICD-10-CM

## 2011-10-29 DIAGNOSIS — E785 Hyperlipidemia, unspecified: Secondary | ICD-10-CM | POA: Diagnosis present

## 2011-10-29 DIAGNOSIS — J9600 Acute respiratory failure, unspecified whether with hypoxia or hypercapnia: Secondary | ICD-10-CM

## 2011-10-29 DIAGNOSIS — S2220XA Unspecified fracture of sternum, initial encounter for closed fracture: Secondary | ICD-10-CM | POA: Diagnosis not present

## 2011-10-29 DIAGNOSIS — R079 Chest pain, unspecified: Secondary | ICD-10-CM

## 2011-10-29 DIAGNOSIS — R578 Other shock: Secondary | ICD-10-CM | POA: Diagnosis not present

## 2011-10-29 DIAGNOSIS — D638 Anemia in other chronic diseases classified elsewhere: Secondary | ICD-10-CM | POA: Diagnosis present

## 2011-10-29 DIAGNOSIS — Y832 Surgical operation with anastomosis, bypass or graft as the cause of abnormal reaction of the patient, or of later complication, without mention of misadventure at the time of the procedure: Secondary | ICD-10-CM | POA: Diagnosis not present

## 2011-10-29 DIAGNOSIS — D62 Acute posthemorrhagic anemia: Secondary | ICD-10-CM | POA: Diagnosis not present

## 2011-10-29 DIAGNOSIS — N058 Unspecified nephritic syndrome with other morphologic changes: Secondary | ICD-10-CM | POA: Diagnosis not present

## 2011-10-29 HISTORY — DX: Unspecified right bundle-branch block: I45.10

## 2011-10-29 LAB — URINALYSIS, ROUTINE W REFLEX MICROSCOPIC
Bilirubin Urine: NEGATIVE
Ketones, ur: NEGATIVE mg/dL
Leukocytes, UA: NEGATIVE
Nitrite: NEGATIVE
Protein, ur: NEGATIVE mg/dL
pH: 5.5 (ref 5.0–8.0)

## 2011-10-29 LAB — BASIC METABOLIC PANEL
BUN: 14 mg/dL (ref 6–23)
Calcium: 10.9 mg/dL — ABNORMAL HIGH (ref 8.4–10.5)
Chloride: 99 mEq/L (ref 96–112)
Creatinine, Ser: 1.13 mg/dL (ref 0.50–1.35)
GFR calc Af Amer: 83 mL/min — ABNORMAL LOW (ref >90)
GFR calc non Af Amer: 72 mL/min — ABNORMAL LOW (ref >90)

## 2011-10-29 LAB — CBC WITH DIFFERENTIAL/PLATELET
Basophils Absolute: 0 10*3/uL (ref 0.0–0.1)
Basophils Relative: 0 % (ref 0–1)
Eosinophils Absolute: 0.1 10*3/uL (ref 0.0–0.7)
HCT: 43.6 % (ref 39.0–52.0)
MCH: 31 pg (ref 26.0–34.0)
MCHC: 34.9 g/dL (ref 30.0–36.0)
Monocytes Absolute: 0.7 10*3/uL (ref 0.1–1.0)
Neutro Abs: 10.7 10*3/uL — ABNORMAL HIGH (ref 1.7–7.7)
RDW: 12.8 % (ref 11.5–15.5)

## 2011-10-29 LAB — RAPID URINE DRUG SCREEN, HOSP PERFORMED
Amphetamines: NOT DETECTED
Barbiturates: POSITIVE — AB
Benzodiazepines: NOT DETECTED
Cocaine: NOT DETECTED

## 2011-10-29 LAB — PROTIME-INR: INR: 0.96 (ref 0.00–1.49)

## 2011-10-29 LAB — TROPONIN I
Troponin I: <0.3 ng/mL (ref <0.30)
Troponin I: <0.3 ng/mL (ref <0.30)

## 2011-10-29 MED ORDER — ONDANSETRON HCL 4 MG/2ML IJ SOLN: 4.0000 mg | Freq: Four times a day (QID) | INTRAMUSCULAR | Status: DC | PRN

## 2011-10-29 MED ORDER — OXYCODONE HCL 5 MG PO TABS
5.0000 mg | ORAL_TABLET | ORAL | Status: DC | PRN
  Administered 2011-10-29 – 2011-10-30 (×3): 5 mg via ORAL
  Filled 2011-10-29 (×3): qty 1

## 2011-10-29 MED ORDER — HYDRALAZINE HCL 20 MG/ML IJ SOLN
10.0000 mg | INTRAMUSCULAR | Status: DC | PRN
  Filled 2011-10-29: qty 0.5
  Filled 2011-10-29: qty 1

## 2011-10-29 MED ORDER — NITROGLYCERIN 0.4 MG SL SUBL
0.4000 mg | SUBLINGUAL_TABLET | SUBLINGUAL | Status: DC | PRN
  Administered 2011-10-29 (×2): 0.4 mg via SUBLINGUAL

## 2011-10-29 MED ORDER — DOCUSATE SODIUM 100 MG PO CAPS
100.0000 mg | ORAL_CAPSULE | Freq: Two times a day (BID) | ORAL | Status: DC
  Filled 2011-10-29 (×2): qty 1

## 2011-10-29 MED ORDER — ASPIRIN EC 81 MG PO TBEC
81.0000 mg | DELAYED_RELEASE_TABLET | Freq: Every day | ORAL | Status: DC
  Filled 2011-10-29 (×2): qty 1

## 2011-10-29 MED ORDER — GI COCKTAIL ~~LOC~~
30.0000 mL | Freq: Once | ORAL | Status: DC
  Filled 2011-10-29: qty 30

## 2011-10-29 MED ORDER — ONDANSETRON HCL 4 MG PO TABS: 4.0000 mg | ORAL_TABLET | Freq: Four times a day (QID) | ORAL | Status: DC | PRN

## 2011-10-29 MED ORDER — ACETAMINOPHEN 325 MG PO TABS
650.0000 mg | ORAL_TABLET | Freq: Once | ORAL | Status: AC
  Administered 2011-10-29: 650 mg via ORAL
  Filled 2011-10-29: qty 2

## 2011-10-29 MED ORDER — ENOXAPARIN SODIUM 40 MG/0.4ML ~~LOC~~ SOLN
40.0000 mg | SUBCUTANEOUS | Status: DC
  Administered 2011-10-29: 40 mg via SUBCUTANEOUS
  Filled 2011-10-29 (×2): qty 0.4

## 2011-10-29 MED ORDER — GI COCKTAIL ~~LOC~~
30.0000 mL | Freq: Once | ORAL | Status: AC
  Administered 2011-10-29: 30 mL via ORAL

## 2011-10-29 MED ORDER — ACETAMINOPHEN 325 MG PO TABS
650.0000 mg | ORAL_TABLET | Freq: Four times a day (QID) | ORAL | Status: DC | PRN
  Administered 2011-10-29: 650 mg via ORAL
  Filled 2011-10-29: qty 2

## 2011-10-29 MED ORDER — ACETAMINOPHEN 650 MG RE SUPP
650.0000 mg | Freq: Four times a day (QID) | RECTAL | Status: DC | PRN
  Administered 2011-10-31: 650 mg via RECTAL

## 2011-10-29 NOTE — H&P (Signed)
[facility] History and Physical  Gabriel Tran ZOX:[id number] DOB: 24-Aug-1955 DOA: 10/29/2011   PCP: Crawford Givens, MD   Chief Complaint: Chest pain  HPI:  56 year old male woke up out of a nap around noontime this morning and experienced chest discomfort radiating to the neck. The patient denied any dizziness or shortness of breath. 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 His blood pressure was checked at that time and it was noted to be 230/110. He was chest port by ambulance to the emergency department. In route, the patient received aspirin and nitroglycerin with some relief of his chest discomfort. In the emergency department, the patient received some antacids which gave him pain relief. He is currently pain-free. Without any further intervention, his blood pressure in the emergency department was 163/68. However, his systolic blood pressure has been as high as 186 in the emergency department. He denies any shortness of breath, dizziness, palpitations, nausea, vomiting, fevers, chills, orthopnea, PND, dysuria, hematuria. Patient states that he did have some diaphoresis.  Of note, the patient states that his father had coronary disease in his early 29s. His brother had a coronary artery bypass graft at age 10. The patient does not take aspirin everyday. He states that he has been faithful to his metoprolol tartrate and amlodipine. The patient had a stress test in 2001 which was negative. He states that he last saw his primary care provider in August 2013 at which time his blood pressure is 130/90. He drinks 10-12 beers per week. He denies any illegal drug use. He quit smoking approximately 10 years ago after smoking 20 pack years. Assessment/Plan: Hypertensive urgency -Check a.m. renin, aldosterone -Continue metoprolol and amlodipine -Patient does not want increased dose of amlodipine due to peripheral edema -Add ACE inhibitor if renal function remains stable -Hydralazine when  necessary systolic blood pressure >185 -Urine toxicology -TSH Atypical chest pain -Occurred at rest when he woke up -Serial troponins, EKG -Consult cardiology in the morning if negative -Last stress test in 2001 negative Right bundle branch block -Patient states that he has known about this since 1996 CKD stage II Dyslipidemia -Check morning lipids Leukocytosis -Check UA  Active Problems:  * No active hospital problems. *        Past Medical History  Diagnosis Date  . Hypertension   . Hyperglycemia    Past Surgical History  Procedure Date  . Knee surgery     LEFT 1981  . Carpal tunnel release     2004 RIGHT  . Wisdom tooth extraction    Social History:  reports that he quit smoking about 21 years ago. His smoking use included Cigarettes. He does not have any smokeless tobacco history on file. He reports that he drinks alcohol. He reports that he does not use illicit drugs.  No Known Allergies  Family History  Problem Relation Age of Onset  . Parkinsonism Mother   . Leukemia Father   . Cancer Father     AML at 37  . Colon polyps Brother   . Kidney disease Brother   . Heart disease Brother   . Prostate cancer Neg Hx   . Colon cancer Neg Hx     Prior to Admission medications   Medication Sig Start Date End Date Taking? Authorizing Provider  amLODipine (NORVASC) 5 MG tablet Take 1 tablet (5 mg total) by mouth daily. 09/06/11  Yes Joaquim Nam, MD  metoprolol (LOPRESSOR) 100 MG tablet Take 1 tablet (100 mg total) by mouth  2 (two) times daily. 09/06/11  Yes Joaquim Nam, MD    Review of Systems:  Constitutional:  No weight loss, night sweats, Fevers, chills, fatigue.  Head&Eyes: No headache.  No vision loss.  No eye pain or scotoma ENT:  No Difficulty swallowing,Tooth/dental problems,Sore throat,  No ear ache, post nasal drip,  Cardio-vascular:  Orthopnea, PND, swelling in lower extremities,  dizziness, palpitations  GI:  No heartburn, indigestion,  abdominal pain, nausea, vomiting, diarrhea,loss of appetite, hematochezia, melena Resp:  No shortness of breath with exertion or at rest. No excess mucus, no productive cough, No non-productive cough, No coughing up of blood.No change in color of mucus.No wheezing.No chest wall deformity  Skin:  no rash or lesions.  GU:  no dysuria, change in color of urine, no urgency or frequency. No flank pain.  Musculoskeletal:  No joint pain or swelling. No decreased range of motion. No back pain.  Psych:  No change in mood or affect. No depression or anxiety. Neurologic: No headache, no dysesthesia, no focal weakness, no vision loss. No syncope  Physical Exam: Filed Vitals:   10/29/11 1409 10/29/11 1901  BP: 171/78 170/74  Pulse: 49 54  Temp: 98.9 F (37.2 C)   TempSrc: Oral   Resp: 14 18  SpO2: 99% 100%   General:  A&O x 3, NAD, nontoxic, pleasant/cooperative Head/Eye: No conjunctival hemorrhage, no icterus, [place]/AT, No nystagmus ENT:  No icterus,  No thrush, good dentition, no pharyngeal exudate Neck:  No masses, no lymphadenpathy, no bruits CV:  RRR, no rub, no gallop, no S3 Lung:  CTAB, good air movement, no wheeze, no rhonchi Abdomen: soft/NT, +BS, nondistended, no peritoneal signs Ext: No cyanosis, No rashes, No petechiae, No lymphangitis, No edema Neuro: CNII-XII intact, strength 4/5 in bilateral upper and lower extremities, no dysmetria  Labs on Admission:  Basic Metabolic Panel:  Lab 10/29/11 1610  NA 137  K 4.7  CL 99  CO2 29  GLUCOSE 129*  BUN 14  CREATININE 1.13  CALCIUM 10.9*  MG --  PHOS --   Liver Function Tests: No results found for this basename: AST:5,ALT:5,ALKPHOS:5,BILITOT:5,PROT:5,ALBUMIN:5 in the last 168 hours No results found for this basename: LIPASE:5,AMYLASE:5 in the last 168 hours No results found for this basename: AMMONIA:5 in the last 168 hours CBC:  Lab 10/29/11 1601  WBC 13.1*  NEUTROABS 10.7*  HGB 15.2  HCT 43.6  MCV 88.8  PLT 189    Cardiac Enzymes:  Lab 10/29/11 1601  CKTOTAL --  CKMB --  CKMBINDEX --  TROPONINI <0.30   BNP: No components found with this basename: POCBNP:5 CBG: No results found for this basename: GLUCAP:5 in the last 168 hours  Radiological Exams on Admission: Dg Chest 2 View  10/29/2011  *RADIOLOGY REPORT*  Clinical Data: Chest pain.  CHEST - 2 VIEW  Comparison: None.  Findings: Trachea is midline.  Heart is at the upper limits of normal in size.  Lungs are low in volume but clear.  Pleural thickening bilaterally may be due to extrapleural fat.  No pleural fluid.  Lower thoracic or upper lumbar compression fracture is age determinate.  IMPRESSION:  1.  No acute findings the chest. 2.  Lower thoracic or upper lumbar compression fracture is age indeterminate.   Original Report Authenticated By: Reyes Ivan, M.D.     EKG: Independently reviewed. RBBB    Time spend:70 minutes Code Status: full Family Communication: Wife at bedside   Konor Noren, DO  [facility] Pager  [phone]  If 7PM-7AM, please contact night-coverage [email or web address] Password [facility] 10/29/2011, 7:08 PM

## 2011-10-29 NOTE — ED Notes (Signed)
Ordered diet tray 

## 2011-10-29 NOTE — ED Provider Notes (Signed)
History     CSN: [id number]  Arrival date & time 10/29/11  1403   First MD Initiated Contact with Patient 10/29/11 1405      Chief Complaint  Patient presents with  . Chest Pain   In addition to what is listed below. Patient is a 56 year old male with past medical history significant for high blood pressure who presents to emergency department with chest pain. Patient states that around noon he woke up from a nap and stood up from the couch. After doing so he had sudden onset of pain in his chest radiating up into his head. Patient denies associated symptoms. Pain was described as 6/10 in severity and dull in quality. Patient reports taking combination of aspirin, TUMS, and nitroglycerin. Status post treatment patient now only complaints with minor headache.    (Consider location/radiation/quality/duration/timing/severity/associated sxs/prior treatment) Patient is a 56 y.o. male presenting with chest pain. The history is provided by the patient and medical records. No language interpreter was used.  Chest Pain The chest pain began 3 - 5 hours ago. Chest pain occurs constantly. The chest pain is improving. At its most intense, the pain is at 6/10. The pain is currently at 1/10. The severity of the pain is mild. The quality of the pain is described as dull. The pain radiates to the left neck and right neck. Pertinent negatives for primary symptoms include no fever, no syncope, no shortness of breath, no palpitations, no abdominal pain, no nausea, no vomiting and no dizziness.  Pertinent negatives for associated symptoms include no claudication and no diaphoresis. He tried nitroglycerin, aspirin and antacids for the symptoms. Risk factors include obesity, alcohol intake, male gender and smoking/tobacco exposure.  His past medical history is significant for hypertension.  Pertinent negatives for past medical history include no CAD.  His family medical history is significant for CAD in family.    Procedure history is positive for echocardiogram (>10 years ago) and exercise treadmill test (>10 years ago).     Past Medical History  Diagnosis Date  . Hypertension   . Hyperglycemia     Past Surgical History  Procedure Date  . Knee surgery     LEFT 1981  . Carpal tunnel release     2004 RIGHT  . Wisdom tooth extraction     Family History  Problem Relation Age of Onset  . Parkinsonism Mother   . Leukemia Father   . Cancer Father     AML at 44  . Colon polyps Brother   . Kidney disease Brother   . Heart disease Brother   . Prostate cancer Neg Hx   . Colon cancer Neg Hx     History  Substance Use Topics  . Smoking status: Former Smoker    Types: Cigarettes    Quit date: 02/05/1990  . Smokeless tobacco: Not on file  . Alcohol Use: 0.0 oz/week     10-12 beer a week on average      Review of Systems  Constitutional: Negative for fever and diaphoresis.  Respiratory: Negative for shortness of breath.   Cardiovascular: Positive for chest pain. Negative for palpitations, claudication and syncope.  Gastrointestinal: Negative for nausea, vomiting and abdominal pain.  Neurological: Negative for dizziness.  All other systems reviewed and are negative.    Allergies  Review of patient's allergies indicates no known allergies.  Home Medications   Current Outpatient Rx  Name Route Sig Dispense Refill  . AMLODIPINE BESYLATE 5 MG PO TABS Oral Take  1 tablet (5 mg total) by mouth daily. 90 tablet 3  . METOPROLOL TARTRATE 100 MG PO TABS Oral Take 1 tablet (100 mg total) by mouth 2 (two) times daily. 180 tablet 3    BP 171/78  Pulse 49  Temp 98.9 F (37.2 C) (Oral)  Resp 14  SpO2 99%  Physical Exam  Nursing note and vitals reviewed. Constitutional: He is oriented to person, place, and time. He appears well-developed and well-nourished. No distress.  HENT:  Head: Normocephalic and atraumatic.  Right Ear: External ear normal.  Left Ear: External ear normal.   Nose: Nose normal.  Mouth/Throat: Oropharynx is clear and moist. No oropharyngeal exudate.  Eyes: Conjunctivae normal and EOM are normal.  Neck: Normal range of motion. Neck supple.  Cardiovascular: Regular rhythm and intact distal pulses.  Bradycardia present.   Pulmonary/Chest: Effort normal and breath sounds normal. No respiratory distress. He has no wheezes. He has no rales. He exhibits no tenderness.  Abdominal: Soft. Bowel sounds are normal.  Musculoskeletal: Normal range of motion. He exhibits no edema and no tenderness.  Neurological: He is alert and oriented to person, place, and time. He has normal reflexes. He displays normal reflexes. No cranial nerve deficit. He exhibits normal muscle tone. Coordination normal.  Skin: Skin is warm and dry.  Psychiatric: He has a normal mood and affect.    ED Course  Procedures (including critical care time)  Labs Reviewed  CBC WITH DIFFERENTIAL - Abnormal; Notable for the following:    WBC 13.1 (*)     Neutrophils Relative 82 (*)     Neutro Abs 10.7 (*)     All other components within normal limits  BASIC METABOLIC PANEL - Abnormal; Notable for the following:    Glucose, Bld 129 (*)     Calcium 10.9 (*)     GFR calc non Af Amer 72 (*)     GFR calc Af Amer 83 (*)     All other components within normal limits  TROPONIN I   Dg Chest 2 View  10/29/2011  *RADIOLOGY REPORT*  Clinical Data: Chest pain.  CHEST - 2 VIEW  Comparison: None.  Findings: Trachea is midline.  Heart is at the upper limits of normal in size.  Lungs are low in volume but clear.  Pleural thickening bilaterally may be due to extrapleural fat.  No pleural fluid.  Lower thoracic or upper lumbar compression fracture is age determinate.  IMPRESSION:  1.  No acute findings the chest. 2.  Lower thoracic or upper lumbar compression fracture is age indeterminate.   Original Report Authenticated By: Reyes Ivan, M.D.      Date: 10/29/2011  Rate: 54  Rhythm: sinus  bradycardia  QRS Axis: normal  Intervals: normal  ST/T Wave abnormalities: nonspecific ST changes  Conduction Disutrbances:right bundle branch block  Narrative Interpretation:   Old EKG Reviewed: none available     1. Chest pain   2. Headache   3. Hypertension   4. Family history of heart attack       MDM    Patient is a 56 year old male with past medical history significant for hypertension, hyperglycemia, as well as positive family history for heart disease who presents emergency department with complaints of chest pain. Prior to arrival blood pressure obtained by EMS personnel showed reading of 230/110. Upon arrival in the emergency department patient was noted to be afebrile and vital signs remarkable for initial blood pressure of 171/78. On exam patient with  normal heart-lung sounds, no peripheral edema, no JVD and otherwise as above. EKG showed sinus bradycardia, right bundle branch block, and nonspecific ST changes. No prior EKGs available. Due to concern for possible ACS, chest x-ray and labs were obtained. Chest x-ray and troponin within normal limits.  For additional rule out of ACS, patient felt to need admission.  Patient admitted without acute events.          Johnney Ou, MD 10/29/11 [phone]

## 2011-10-29 NOTE — ED Notes (Signed)
Paged hospitalist regarding chest/neck pain

## 2011-10-29 NOTE — ED Notes (Signed)
Patient transported to X-ray 

## 2011-10-29 NOTE — ED Notes (Signed)
Pt is here after waking up from nap and having chest pain to throat, neck, and back.  Pt with bp 230/110.  Pt complained of headache.  Pt had one nitro by ems and then took his own asa 650mg .  Pt is now pain free.

## 2011-10-30 ENCOUNTER — Inpatient Hospital Stay (HOSPITAL_COMMUNITY): Payer: PRIVATE HEALTH INSURANCE

## 2011-10-30 ENCOUNTER — Encounter (HOSPITAL_COMMUNITY): Payer: Self-pay | Admitting: Anesthesiology

## 2011-10-30 ENCOUNTER — Encounter (HOSPITAL_COMMUNITY)
Admission: EM | Disposition: A | Discharge status: Other Acute Inpatient Hospital | Payer: Self-pay | Source: Home / Self Care | Attending: Cardiothoracic Surgery

## 2011-10-30 ENCOUNTER — Other Ambulatory Visit: Payer: Self-pay | Admitting: *Deleted

## 2011-10-30 ENCOUNTER — Encounter (HOSPITAL_COMMUNITY): Payer: Self-pay | Admitting: Physician Assistant

## 2011-10-30 ENCOUNTER — Inpatient Hospital Stay (HOSPITAL_COMMUNITY): Payer: PRIVATE HEALTH INSURANCE | Admitting: Anesthesiology

## 2011-10-30 DIAGNOSIS — I7100 Dissection of unspecified site of aorta: Secondary | ICD-10-CM

## 2011-10-30 DIAGNOSIS — I2510 Atherosclerotic heart disease of native coronary artery without angina pectoris: Secondary | ICD-10-CM

## 2011-10-30 DIAGNOSIS — R079 Chest pain, unspecified: Secondary | ICD-10-CM

## 2011-10-30 DIAGNOSIS — I4510 Unspecified right bundle-branch block: Secondary | ICD-10-CM

## 2011-10-30 HISTORY — PX: THORACIC AORTIC ANEURYSM REPAIR: SHX799

## 2011-10-30 HISTORY — PX: LEFT HEART CATHETERIZATION WITH CORONARY ANGIOGRAM: SHX5451

## 2011-10-30 LAB — POCT I-STAT 3, ART BLOOD GAS (G3+)
O2 Saturation: 100 %
O2 Saturation: 100 %
O2 Saturation: 100 %
O2 Saturation: 92 %
Patient temperature: 35.4
TCO2: 27 mmol/L (ref 0–100)
TCO2: 28 mmol/L (ref 0–100)
pCO2 arterial: 44.6 mmHg (ref 35.0–45.0)
pCO2 arterial: 70.3 mmHg (ref 35.0–45.0)
pCO2 arterial: 44.1 mmHg (ref 35.0–45.0)
pCO2 arterial: 47.1 mmHg — ABNORMAL HIGH (ref 35.0–45.0)
pH, Arterial: 7.382 (ref 7.350–7.450)
pH, Arterial: 7.126 — CL (ref 7.350–7.450)
pH, Arterial: 7.316 — ABNORMAL LOW (ref 7.350–7.450)
pO2, Arterial: 432 mmHg — ABNORMAL HIGH (ref 80.0–100.0)
pO2, Arterial: 61 mmHg — ABNORMAL LOW (ref 80.0–100.0)

## 2011-10-30 LAB — POCT I-STAT 4, (NA,K, GLUC, HGB,HCT)
Glucose, Bld: 118 mg/dL — ABNORMAL HIGH (ref 70–99)
Glucose, Bld: 116 mg/dL — ABNORMAL HIGH (ref 70–99)
Glucose, Bld: 194 mg/dL — ABNORMAL HIGH (ref 70–99)
Glucose, Bld: 197 mg/dL — ABNORMAL HIGH (ref 70–99)
Glucose, Bld: 162 mg/dL — ABNORMAL HIGH (ref 70–99)
HCT: 26 % — ABNORMAL LOW (ref 39.0–52.0)
HCT: 27 % — ABNORMAL LOW (ref 39.0–52.0)
HCT: 28 % — ABNORMAL LOW (ref 39.0–52.0)
HCT: 28 % — ABNORMAL LOW (ref 39.0–52.0)
HCT: 31 % — ABNORMAL LOW (ref 39.0–52.0)
HCT: 34 % — ABNORMAL LOW (ref 39.0–52.0)
Hemoglobin: 10.9 g/dL — ABNORMAL LOW (ref 13.0–17.0)
Hemoglobin: 10.9 g/dL — ABNORMAL LOW (ref 13.0–17.0)
Hemoglobin: 9.5 g/dL — ABNORMAL LOW (ref 13.0–17.0)
Hemoglobin: 10.5 g/dL — ABNORMAL LOW (ref 13.0–17.0)
Potassium: 4 mEq/L (ref 3.5–5.1)
Potassium: 4.9 mEq/L (ref 3.5–5.1)
Potassium: 3.3 mEq/L — ABNORMAL LOW (ref 3.5–5.1)
Potassium: 3.4 mEq/L — ABNORMAL LOW (ref 3.5–5.1)
Sodium: 135 mEq/L (ref 135–145)
Sodium: 132 mEq/L — ABNORMAL LOW (ref 135–145)
Sodium: 132 mEq/L — ABNORMAL LOW (ref 135–145)
Sodium: 141 mEq/L (ref 135–145)

## 2011-10-30 LAB — PROTIME-INR: Prothrombin Time: 17.4 seconds — ABNORMAL HIGH (ref 11.6–15.2)

## 2011-10-30 LAB — CBC
MCH: 31 pg (ref 26.0–34.0)
MCHC: 35.3 g/dL (ref 30.0–36.0)
MCV: 87.9 fL (ref 78.0–100.0)
WBC: 20.5 10*3/uL — ABNORMAL HIGH (ref 4.0–10.5)

## 2011-10-30 LAB — LIPID PANEL
HDL: 53 mg/dL (ref >39)
LDL Cholesterol: 117 mg/dL — ABNORMAL HIGH (ref 0–99)
Total CHOL/HDL Ratio: 3.6 RATIO

## 2011-10-30 LAB — COMPREHENSIVE METABOLIC PANEL
BUN: 13 mg/dL (ref 6–23)
CO2: 28 mEq/L (ref 19–32)
Calcium: 9.7 mg/dL (ref 8.4–10.5)
Chloride: 100 mEq/L (ref 96–112)
Creatinine, Ser: 1.12 mg/dL (ref 0.50–1.35)
GFR calc Af Amer: 84 mL/min — ABNORMAL LOW (ref >90)
GFR calc non Af Amer: 73 mL/min — ABNORMAL LOW (ref >90)
Glucose, Bld: 111 mg/dL — ABNORMAL HIGH (ref 70–99)
Total Bilirubin: 0.7 mg/dL (ref 0.3–1.2)

## 2011-10-30 LAB — HEMOGLOBIN AND HEMATOCRIT, BLOOD
HCT: 28.1 % — ABNORMAL LOW (ref 39.0–52.0)
Hemoglobin: 9.9 g/dL — ABNORMAL LOW (ref 13.0–17.0)

## 2011-10-30 LAB — HEMOGLOBIN A1C: Mean Plasma Glucose: 123 mg/dL — ABNORMAL HIGH (ref <117)

## 2011-10-30 LAB — PLATELET COUNT: Platelets: 86 10*3/uL — ABNORMAL LOW (ref 150–400)

## 2011-10-30 SURGERY — REPAIR, ANEURYSM, AORTA, THORACIC, ASCENDING
Anesthesia: General | Site: Chest | Wound class: Clean

## 2011-10-30 SURGERY — LEFT HEART CATHETERIZATION WITH CORONARY ANGIOGRAM
Anesthesia: LOCAL

## 2011-10-30 MED ORDER — PANTOPRAZOLE SODIUM 40 MG PO TBEC
40.0000 mg | DELAYED_RELEASE_TABLET | Freq: Every day | ORAL | Status: DC
  Administered 2011-11-01 – 2011-11-03 (×3): 40 mg via ORAL
  Filled 2011-10-30 (×3): qty 1

## 2011-10-30 MED ORDER — METOPROLOL TARTRATE 12.5 MG HALF TABLET
12.5000 mg | ORAL_TABLET | Freq: Two times a day (BID) | ORAL | Status: DC
  Filled 2011-10-30 (×3): qty 1

## 2011-10-30 MED ORDER — SODIUM CHLORIDE 0.9 % IV SOLN
INTRAVENOUS | Status: AC
  Administered 2011-10-30: 1 [IU]/h via INTRAVENOUS
  Filled 2011-10-30: qty 1

## 2011-10-30 MED ORDER — VANCOMYCIN HCL 1000 MG IV SOLR
1000.0000 mg | INTRAVENOUS | Status: DC
  Filled 2011-10-30: qty 1000

## 2011-10-30 MED ORDER — SUCCINYLCHOLINE CHLORIDE 20 MG/ML IJ SOLN
INTRAMUSCULAR | Status: DC | PRN
  Administered 2011-10-30: 100 mg via INTRAVENOUS

## 2011-10-30 MED ORDER — EPINEPHRINE HCL 1 MG/ML IJ SOLN
0.5000 ug/min | INTRAVENOUS | Status: DC
  Filled 2011-10-30: qty 4

## 2011-10-30 MED ORDER — DOPAMINE-DEXTROSE 3.2-5 MG/ML-% IV SOLN
2.0000 ug/kg/min | INTRAVENOUS | Status: DC
  Filled 2011-10-30: qty 250

## 2011-10-30 MED ORDER — LIDOCAINE HCL (CARDIAC) 20 MG/ML IV SOLN
INTRAVENOUS | Status: DC | PRN
  Administered 2011-10-30: 80 mg via INTRAVENOUS

## 2011-10-30 MED ORDER — LACTATED RINGERS IV SOLN: 500.0000 mL | Freq: Once | INTRAVENOUS | Status: AC | PRN

## 2011-10-30 MED ORDER — HEPARIN SODIUM (PORCINE) 1000 UNIT/ML IJ SOLN
INTRAMUSCULAR | Status: DC | PRN
  Administered 2011-10-30: 10000 [IU] via INTRAVENOUS
  Administered 2011-10-30: 23000 [IU] via INTRAVENOUS
  Administered 2011-10-30: 5000 [IU] via INTRAVENOUS

## 2011-10-30 MED ORDER — NITROGLYCERIN IN D5W 200-5 MCG/ML-% IV SOLN: 0.0000 ug/min | INTRAVENOUS | Status: DC

## 2011-10-30 MED ORDER — AMLODIPINE BESYLATE 5 MG PO TABS
5.0000 mg | ORAL_TABLET | Freq: Every day | ORAL | Status: DC
  Administered 2011-11-01 – 2011-11-02 (×2): 5 mg via ORAL
  Filled 2011-10-30 (×5): qty 1

## 2011-10-30 MED ORDER — DEXMEDETOMIDINE HCL IN NACL 400 MCG/100ML IV SOLN
0.1000 ug/kg/h | INTRAVENOUS | Status: AC
  Administered 2011-10-30: 0.2 ug/kg/h via INTRAVENOUS
  Filled 2011-10-30: qty 100

## 2011-10-30 MED ORDER — METOPROLOL TARTRATE 1 MG/ML IV SOLN: 2.5000 mg | INTRAVENOUS | Status: DC | PRN

## 2011-10-30 MED ORDER — ETOMIDATE 2 MG/ML IV SOLN
INTRAVENOUS | Status: DC | PRN
  Administered 2011-10-30: 20 mg via INTRAVENOUS

## 2011-10-30 MED ORDER — CHLORHEXIDINE GLUCONATE 4 % EX LIQD
60.0000 mL | Freq: Once | CUTANEOUS | Status: DC
  Filled 2011-10-30: qty 60

## 2011-10-30 MED ORDER — ASPIRIN EC 325 MG PO TBEC
325.0000 mg | DELAYED_RELEASE_TABLET | Freq: Every day | ORAL | Status: DC
  Administered 2011-10-31 – 2011-11-02 (×3): 325 mg via ORAL
  Filled 2011-10-30 (×4): qty 1

## 2011-10-30 MED ORDER — POTASSIUM CHLORIDE 10 MEQ/50ML IV SOLN
10.0000 meq | INTRAVENOUS | Status: AC
  Administered 2011-10-31 (×3): 10 meq via INTRAVENOUS

## 2011-10-30 MED ORDER — SODIUM CHLORIDE 0.9 % IV SOLN
INTRAVENOUS | Status: DC
  Filled 2011-10-30 (×2): qty 40

## 2011-10-30 MED ORDER — PHENYLEPHRINE HCL 10 MG/ML IJ SOLN
0.0000 ug/min | INTRAVENOUS | Status: DC
  Administered 2011-11-04: 15 ug/min via INTRAVENOUS
  Filled 2011-10-30: qty 2

## 2011-10-30 MED ORDER — IOHEXOL 350 MG/ML SOLN
100.0000 mL | Freq: Once | INTRAVENOUS | Status: AC | PRN
  Administered 2011-10-30: 100 mL via INTRAVENOUS

## 2011-10-30 MED ORDER — METOPROLOL TARTRATE 25 MG PO TABS
25.0000 mg | ORAL_TABLET | Freq: Two times a day (BID) | ORAL | Status: DC
  Filled 2011-10-30 (×2): qty 1

## 2011-10-30 MED ORDER — LIDOCAINE HCL (PF) 1 % IJ SOLN
INTRAMUSCULAR | Status: AC
  Filled 2011-10-30: qty 30

## 2011-10-30 MED ORDER — SODIUM CHLORIDE 0.9 % IV SOLN: 250.0000 mL | INTRAVENOUS | Status: DC | PRN

## 2011-10-30 MED ORDER — MIDAZOLAM HCL 5 MG/5ML IJ SOLN
INTRAMUSCULAR | Status: DC | PRN
  Administered 2011-10-30: 4 mg via INTRAVENOUS
  Administered 2011-10-30: 6 mg via INTRAVENOUS

## 2011-10-30 MED ORDER — METOPROLOL TARTRATE 12.5 MG HALF TABLET
12.5000 mg | ORAL_TABLET | Freq: Once | ORAL | Status: DC
  Filled 2011-10-30: qty 1

## 2011-10-30 MED ORDER — SODIUM CHLORIDE 0.9 % IJ SOLN: 3.0000 mL | INTRAMUSCULAR | Status: DC | PRN

## 2011-10-30 MED ORDER — METOPROLOL TARTRATE 25 MG/10 ML ORAL SUSPENSION
12.5000 mg | Freq: Two times a day (BID) | ORAL | Status: DC
  Filled 2011-10-30 (×3): qty 5

## 2011-10-30 MED ORDER — FENTANYL CITRATE 0.05 MG/ML IJ SOLN
INTRAMUSCULAR | Status: AC
  Filled 2011-10-30: qty 2

## 2011-10-30 MED ORDER — ASPIRIN 81 MG PO CHEW: 324.0000 mg | CHEWABLE_TABLET | Freq: Every day | ORAL | Status: DC

## 2011-10-30 MED ORDER — LACTATED RINGERS IV SOLN
INTRAVENOUS | Status: DC | PRN
  Administered 2011-10-30 (×2): via INTRAVENOUS

## 2011-10-30 MED ORDER — HEMOSTATIC AGENTS (NO CHARGE) OPTIME
TOPICAL | Status: DC | PRN
  Administered 2011-10-30 (×3): 1 via TOPICAL

## 2011-10-30 MED ORDER — ACETAMINOPHEN 160 MG/5ML PO SOLN: 975.0000 mg | Freq: Four times a day (QID) | ORAL | Status: DC

## 2011-10-30 MED ORDER — SODIUM CHLORIDE 0.9 % IV SOLN: 250.0000 mL | INTRAVENOUS | Status: DC

## 2011-10-30 MED ORDER — ASPIRIN 81 MG PO CHEW
CHEWABLE_TABLET | ORAL | Status: AC
  Filled 2011-10-30: qty 4

## 2011-10-30 MED ORDER — ACETAMINOPHEN 650 MG RE SUPP: 650.0000 mg | RECTAL | Status: AC

## 2011-10-30 MED ORDER — FENTANYL CITRATE 0.05 MG/ML IJ SOLN
INTRAMUSCULAR | Status: DC | PRN
  Administered 2011-10-30: 150 ug via INTRAVENOUS
  Administered 2011-10-30: 250 ug via INTRAVENOUS
  Administered 2011-10-30: 50 ug via INTRAVENOUS
  Administered 2011-10-30: 250 ug via INTRAVENOUS
  Administered 2011-10-30: 50 ug via INTRAVENOUS
  Administered 2011-10-30: 150 ug via INTRAVENOUS
  Administered 2011-10-30: 100 ug via INTRAVENOUS
  Administered 2011-10-30 (×5): 250 ug via INTRAVENOUS

## 2011-10-30 MED ORDER — VECURONIUM BROMIDE 10 MG IV SOLR
INTRAVENOUS | Status: DC | PRN
  Administered 2011-10-30 (×3): 10 mg via INTRAVENOUS

## 2011-10-30 MED ORDER — SODIUM CHLORIDE 0.9 % IV SOLN
INTRAVENOUS | Status: AC
  Administered 2011-10-30: 70 mL/h via INTRAVENOUS
  Filled 2011-10-30: qty 40

## 2011-10-30 MED ORDER — DEXMEDETOMIDINE HCL IN NACL 200 MCG/50ML IV SOLN
0.1000 ug/kg/h | INTRAVENOUS | Status: DC
  Administered 2011-10-31 (×3): 0.7 ug/kg/h via INTRAVENOUS
  Filled 2011-10-30 (×3): qty 50

## 2011-10-30 MED ORDER — METHYLPREDNISOLONE SODIUM SUCC 125 MG IJ SOLR
INTRAMUSCULAR | Status: DC | PRN
  Administered 2011-10-30: 125 mg via INTRAVENOUS

## 2011-10-30 MED ORDER — LACTATED RINGERS IV SOLN
INTRAVENOUS | Status: DC | PRN
  Administered 2011-10-30: 13:00:00 via INTRAVENOUS

## 2011-10-30 MED ORDER — BISACODYL 5 MG PO TBEC
10.0000 mg | DELAYED_RELEASE_TABLET | Freq: Every day | ORAL | Status: DC
  Administered 2011-10-31 – 2011-11-03 (×4): 10 mg via ORAL
  Filled 2011-10-30 (×4): qty 2

## 2011-10-30 MED ORDER — HEPARIN SODIUM (PORCINE) 1000 UNIT/ML IJ SOLN
INTRAMUSCULAR | Status: AC
  Filled 2011-10-30: qty 1

## 2011-10-30 MED ORDER — DOPAMINE-DEXTROSE 3.2-5 MG/ML-% IV SOLN
INTRAVENOUS | Status: DC | PRN
  Administered 2011-10-30: 2.5 ug/kg/min via INTRAVENOUS

## 2011-10-30 MED ORDER — ASPIRIN EC 81 MG PO TBEC: 81.0000 mg | DELAYED_RELEASE_TABLET | Freq: Every day | ORAL | Status: DC

## 2011-10-30 MED ORDER — HEMOSTATIC AGENTS (NO CHARGE) OPTIME
TOPICAL | Status: DC | PRN
  Administered 2011-10-30: 1 via TOPICAL

## 2011-10-30 MED ORDER — ACETAMINOPHEN 500 MG PO TABS
1000.0000 mg | ORAL_TABLET | Freq: Four times a day (QID) | ORAL | Status: DC
  Filled 2011-10-30 (×4): qty 2

## 2011-10-30 MED ORDER — PLASMA-LYTE 148 IV SOLN
INTRAVENOUS | Status: DC
  Filled 2011-10-30: qty 2.5

## 2011-10-30 MED ORDER — VERAPAMIL HCL 2.5 MG/ML IV SOLN
INTRAVENOUS | Status: AC
  Filled 2011-10-30: qty 2

## 2011-10-30 MED ORDER — DOCUSATE SODIUM 100 MG PO CAPS
200.0000 mg | ORAL_CAPSULE | Freq: Every day | ORAL | Status: DC
  Administered 2011-10-31 – 2011-11-15 (×9): 200 mg via ORAL
  Filled 2011-10-30 (×9): qty 2

## 2011-10-30 MED ORDER — LACTATED RINGERS IV SOLN
INTRAVENOUS | Status: DC | PRN
  Administered 2011-10-30 (×2): via INTRAVENOUS

## 2011-10-30 MED ORDER — VANCOMYCIN HCL IN DEXTROSE 1-5 GM/200ML-% IV SOLN
1000.0000 mg | Freq: Two times a day (BID) | INTRAVENOUS | Status: AC
  Administered 2011-10-31 – 2011-11-01 (×3): 1000 mg via INTRAVENOUS
  Filled 2011-10-30 (×3): qty 200

## 2011-10-30 MED ORDER — NITROGLYCERIN IN D5W 200-5 MCG/ML-% IV SOLN
2.0000 ug/min | INTRAVENOUS | Status: AC
  Administered 2011-10-30: 3 ug/min via INTRAVENOUS
  Filled 2011-10-30: qty 250

## 2011-10-30 MED ORDER — MORPHINE SULFATE 2 MG/ML IJ SOLN: 1.0000 mg | INTRAMUSCULAR | Status: DC | PRN

## 2011-10-30 MED ORDER — LISINOPRIL 10 MG PO TABS
10.0000 mg | ORAL_TABLET | Freq: Every day | ORAL | Status: DC
  Administered 2011-11-01 – 2011-11-02 (×2): 10 mg via ORAL
  Filled 2011-10-30 (×5): qty 1

## 2011-10-30 MED ORDER — ALBUMIN HUMAN 5 % IV SOLN
INTRAVENOUS | Status: DC | PRN
  Administered 2011-10-30 (×2): via INTRAVENOUS

## 2011-10-30 MED ORDER — SODIUM CHLORIDE 0.9 % IR SOLN
Status: DC | PRN
  Administered 2011-10-30: 16:00:00

## 2011-10-30 MED ORDER — ALBUMIN HUMAN 5 % IV SOLN: 250.0000 mL | INTRAVENOUS | Status: AC | PRN

## 2011-10-30 MED ORDER — ONDANSETRON HCL 4 MG/2ML IJ SOLN
4.0000 mg | Freq: Four times a day (QID) | INTRAMUSCULAR | Status: DC | PRN
  Administered 2011-10-31 – 2011-11-09 (×2): 4 mg via INTRAVENOUS
  Filled 2011-10-30 (×2): qty 2

## 2011-10-30 MED ORDER — MORPHINE SULFATE 2 MG/ML IJ SOLN
INTRAMUSCULAR | Status: AC
  Filled 2011-10-30: qty 1

## 2011-10-30 MED ORDER — PHENYLEPHRINE HCL 10 MG/ML IJ SOLN
30.0000 ug/min | INTRAMUSCULAR | Status: DC
  Filled 2011-10-30: qty 2

## 2011-10-30 MED ORDER — FUROSEMIDE 10 MG/ML IJ SOLN
10.0000 mg | Freq: Once | INTRAMUSCULAR | Status: DC
  Filled 2011-10-30: qty 1

## 2011-10-30 MED ORDER — LACTATED RINGERS IV SOLN: INTRAVENOUS | Status: DC

## 2011-10-30 MED ORDER — SODIUM CHLORIDE 0.9 % IV SOLN
INTRAVENOUS | Status: DC
  Administered 2011-11-01 – 2011-11-06 (×3): via INTRAVENOUS

## 2011-10-30 MED ORDER — TEMAZEPAM 15 MG PO CAPS: 15.0000 mg | ORAL_CAPSULE | Freq: Once | ORAL | Status: DC | PRN

## 2011-10-30 MED ORDER — MAGNESIUM SULFATE 50 % IJ SOLN
40.0000 meq | INTRAMUSCULAR | Status: DC
  Filled 2011-10-30: qty 10

## 2011-10-30 MED ORDER — MORPHINE SULFATE 2 MG/ML IJ SOLN
2.0000 mg | INTRAMUSCULAR | Status: DC | PRN
  Administered 2011-10-31 (×3): 4 mg via INTRAVENOUS
  Filled 2011-10-30 (×3): qty 2

## 2011-10-30 MED ORDER — POTASSIUM CHLORIDE 2 MEQ/ML IV SOLN
80.0000 meq | INTRAVENOUS | Status: DC
  Filled 2011-10-30: qty 40

## 2011-10-30 MED ORDER — DEXTROSE 5 % IV SOLN
750.0000 mg | INTRAVENOUS | Status: DC
  Filled 2011-10-30 (×2): qty 750

## 2011-10-30 MED ORDER — BISACODYL 5 MG PO TBEC
5.0000 mg | DELAYED_RELEASE_TABLET | Freq: Once | ORAL | Status: DC
  Filled 2011-10-30: qty 1

## 2011-10-30 MED ORDER — INSULIN REGULAR BOLUS VIA INFUSION
0.0000 [IU] | Freq: Three times a day (TID) | INTRAVENOUS | Status: DC
  Filled 2011-10-30: qty 10

## 2011-10-30 MED ORDER — THROMBIN 5000 UNITS EX SOLR
CUTANEOUS | Status: DC | PRN
  Administered 2011-10-30: 5000 [IU] via TOPICAL

## 2011-10-30 MED ORDER — PROTAMINE SULFATE 10 MG/ML IV SOLN
INTRAVENOUS | Status: DC | PRN
  Administered 2011-10-30: 350 mg via INTRAVENOUS

## 2011-10-30 MED ORDER — FUROSEMIDE 10 MG/ML IJ SOLN
INTRAMUSCULAR | Status: DC | PRN
  Administered 2011-10-30: 10 mg via INTRAMUSCULAR

## 2011-10-30 MED ORDER — HEPARIN (PORCINE) IN NACL 2-0.9 UNIT/ML-% IJ SOLN
INTRAMUSCULAR | Status: AC
  Filled 2011-10-30: qty 1500

## 2011-10-30 MED ORDER — DEXMEDETOMIDINE HCL IN NACL 200 MCG/50ML IV SOLN
0.4000 ug/kg/h | INTRAVENOUS | Status: DC
  Filled 2011-10-30: qty 50

## 2011-10-30 MED ORDER — DEXTROSE 5 % IV SOLN
1.5000 g | INTRAVENOUS | Status: AC
  Administered 2011-10-30: 1.5 g via INTRAVENOUS
  Administered 2011-10-30: .75 g via INTRAVENOUS
  Filled 2011-10-30: qty 1.5

## 2011-10-30 MED ORDER — PHENYLEPHRINE HCL 10 MG/ML IJ SOLN
10.0000 mg | INTRAVENOUS | Status: DC | PRN
  Administered 2011-10-30: 10 ug/min via INTRAVENOUS

## 2011-10-30 MED ORDER — MIDAZOLAM HCL 2 MG/2ML IJ SOLN
2.0000 mg | INTRAMUSCULAR | Status: DC | PRN
  Administered 2011-10-31 (×3): 2 mg via INTRAVENOUS
  Filled 2011-10-30 (×3): qty 2

## 2011-10-30 MED ORDER — MAGNESIUM SULFATE 40 MG/ML IJ SOLN
4.0000 g | Freq: Once | INTRAMUSCULAR | Status: AC
  Administered 2011-10-31: 4 g via INTRAVENOUS
  Filled 2011-10-30: qty 100

## 2011-10-30 MED ORDER — BISACODYL 10 MG RE SUPP: 10.0000 mg | Freq: Every day | RECTAL | Status: DC

## 2011-10-30 MED ORDER — METOPROLOL TARTRATE 1 MG/ML IV SOLN
INTRAVENOUS | Status: AC
  Filled 2011-10-30: qty 5

## 2011-10-30 MED ORDER — SODIUM CHLORIDE 0.45 % IV SOLN
INTRAVENOUS | Status: DC
  Administered 2011-10-31 – 2011-11-03 (×2): via INTRAVENOUS
  Administered 2011-11-03: 20 mL/h via INTRAVENOUS

## 2011-10-30 MED ORDER — DOPAMINE-DEXTROSE 3.2-5 MG/ML-% IV SOLN
0.0000 ug/kg/min | INTRAVENOUS | Status: DC
  Administered 2011-11-01: 3 ug/kg/min via INTRAVENOUS
  Filled 2011-10-30: qty 250

## 2011-10-30 MED ORDER — SODIUM CHLORIDE 0.9 % IV SOLN: INTRAVENOUS | Status: DC

## 2011-10-30 MED ORDER — MIDAZOLAM HCL 2 MG/2ML IJ SOLN
INTRAMUSCULAR | Status: AC
  Filled 2011-10-30: qty 2

## 2011-10-30 MED ORDER — OXYCODONE HCL 5 MG PO TABS
5.0000 mg | ORAL_TABLET | ORAL | Status: DC | PRN
  Administered 2011-10-31 – 2011-11-03 (×10): 10 mg via ORAL
  Filled 2011-10-30 (×10): qty 2

## 2011-10-30 MED ORDER — MORPHINE SULFATE 2 MG/ML IJ SOLN
2.0000 mg | INTRAMUSCULAR | Status: DC | PRN
  Administered 2011-10-30: 2 mg via INTRAVENOUS

## 2011-10-30 MED ORDER — NITROGLYCERIN 0.2 MG/ML ON CALL CATH LAB
INTRAVENOUS | Status: AC
  Filled 2011-10-30: qty 1

## 2011-10-30 MED ORDER — SODIUM BICARBONATE 8.4 % IV SOLN
INTRAVENOUS | Status: DC | PRN
  Administered 2011-10-30: 25 mL via INTRAVENOUS

## 2011-10-30 MED ORDER — SODIUM CHLORIDE 0.9 % IV SOLN
INTRAVENOUS | Status: DC
  Administered 2011-10-31: 05:00:00 via INTRAVENOUS
  Filled 2011-10-30: qty 1

## 2011-10-30 MED ORDER — MICROFIBRILLAR COLL HEMOSTAT EX PADS
MEDICATED_PAD | CUTANEOUS | Status: DC | PRN
  Administered 2011-10-30: 1 via TOPICAL

## 2011-10-30 MED ORDER — VANCOMYCIN HCL IN DEXTROSE 1-5 GM/200ML-% IV SOLN
1000.0000 mg | Freq: Once | INTRAVENOUS | Status: DC
  Filled 2011-10-30: qty 200

## 2011-10-30 MED ORDER — DEXTROSE 5 % IV SOLN
1.5000 g | Freq: Two times a day (BID) | INTRAVENOUS | Status: AC
  Administered 2011-10-31 – 2011-11-01 (×4): 1.5 g via INTRAVENOUS
  Filled 2011-10-30 (×5): qty 1.5

## 2011-10-30 MED ORDER — THROMBIN 5000 UNITS EX SOLR
CUTANEOUS | Status: AC
  Filled 2011-10-30: qty 5000

## 2011-10-30 MED ORDER — SODIUM CHLORIDE 0.9 % IJ SOLN
OROMUCOSAL | Status: DC | PRN
  Administered 2011-10-30 (×3): via TOPICAL

## 2011-10-30 MED ORDER — FAMOTIDINE IN NACL 20-0.9 MG/50ML-% IV SOLN
20.0000 mg | Freq: Two times a day (BID) | INTRAVENOUS | Status: AC
  Administered 2011-10-31: 20 mg via INTRAVENOUS

## 2011-10-30 MED ORDER — ASPIRIN 81 MG PO CHEW
324.0000 mg | CHEWABLE_TABLET | Freq: Once | ORAL | Status: AC
  Administered 2011-10-30: 324 mg via ORAL

## 2011-10-30 MED ORDER — ACETAMINOPHEN 160 MG/5ML PO SOLN
650.0000 mg | ORAL | Status: AC
  Administered 2011-10-31: 650 mg

## 2011-10-30 MED ORDER — VANCOMYCIN HCL 1000 MG IV SOLR
1250.0000 mg | INTRAVENOUS | Status: AC
  Administered 2011-10-30: 1250 mg via INTRAVENOUS
  Filled 2011-10-30: qty 1250

## 2011-10-30 MED ORDER — ESMOLOL HCL 10 MG/ML IV SOLN
INTRAVENOUS | Status: DC | PRN
  Administered 2011-10-30: 10 mg via INTRAVENOUS

## 2011-10-30 MED ORDER — SODIUM CHLORIDE 0.9 % IJ SOLN
3.0000 mL | Freq: Two times a day (BID) | INTRAMUSCULAR | Status: DC
  Administered 2011-10-31 – 2011-11-02 (×6): 3 mL via INTRAVENOUS

## 2011-10-30 MED ORDER — SODIUM CHLORIDE 0.9 % IJ SOLN: 3.0000 mL | Freq: Two times a day (BID) | INTRAMUSCULAR | Status: DC

## 2011-10-30 SURGICAL SUPPLY — 126 items
ADAPTER CARDIO PERF ANTE/RETRO (ADAPTER) ×3 IMPLANT
ADH SRG 12 PREFL SYR 3 SPRDR (MISCELLANEOUS) ×2
ADPR PRFSN 84XANTGRD RTRGD (ADAPTER) ×1
APL SKNCLS STERI-STRIP NONHPOA (GAUZE/BANDAGES/DRESSINGS)
BAG DECANTER FOR FLEXI CONT (MISCELLANEOUS) ×3 IMPLANT
BENZOIN TINCTURE PRP APPL 2/3 (GAUZE/BANDAGES/DRESSINGS) IMPLANT
BLADE STERNUM SYSTEM 6 (BLADE) ×3 IMPLANT
BLADE SURG 15 STRL LF DISP TIS (BLADE) ×1 IMPLANT
BLADE SURG 15 STRL SS (BLADE) ×3
BLADE SURG ROTATE 9660 (MISCELLANEOUS) IMPLANT
CANISTER SUCTION 2500CC (MISCELLANEOUS) ×3 IMPLANT
CANN PRFSN .5XCNCT 15X34-48 (MISCELLANEOUS)
CANNULA ARTERIAL 007325 (MISCELLANEOUS) IMPLANT
CANNULA ARTERIAL 14F 007324 (MISCELLANEOUS) IMPLANT
CANNULA ARTERIAL 18F 007308 (MISCELLANEOUS) IMPLANT
CANNULA ARTERIAL 20F L7309 (MISCELLANEOUS) IMPLANT
CANNULA ARTERIAL 22F 007310 (MISCELLANEOUS) IMPLANT
CANNULA ARTERIAL 24F 007311 (MISCELLANEOUS) IMPLANT
CANNULA GUNDRY RCSP 15FR (MISCELLANEOUS) ×8 IMPLANT
CANNULA OPTISITE PERFUSION 18F (CANNULA) ×2 IMPLANT
CANNULA PRFSN .5XCNCT 15X34-48 (MISCELLANEOUS) IMPLANT
CANNULA VEN 2 STAGE (MISCELLANEOUS)
CATH FOLEY 2WAY SLVR 30CC 20FR (CATHETERS) ×2 IMPLANT
CATH FOLEY 2WAY SLVR 30CC 24FR (CATHETERS) ×2 IMPLANT
CATH RETROPLEGIA CORONARY 14FR (CATHETERS) ×3 IMPLANT
CATH THORACIC 28FR (CATHETERS) IMPLANT
CATH THORACIC 36FR RT ANG (CATHETERS) ×3 IMPLANT
CAUTERY EYE LOW TEMP 1300F FIN (OPHTHALMIC RELATED) ×5 IMPLANT
CLIP TI MEDIUM 6 (CLIP) ×3 IMPLANT
CLIP TI WIDE RED SMALL 24 (CLIP) IMPLANT
CLIP TI WIDE RED SMALL 6 (CLIP) IMPLANT
CLOSURE WOUND 1/2 X4 (GAUZE/BANDAGES/DRESSINGS)
CLOTH BEACON ORANGE TIMEOUT ST (SAFETY) ×3 IMPLANT
CONN 1/2X1/2X1/2  BEN (MISCELLANEOUS)
CONN 1/2X1/2X1/2 BEN (MISCELLANEOUS) IMPLANT
CONN 1/4X1/4 STERILE (MISCELLANEOUS) ×2 IMPLANT
CONN 3/8X3/8 GISH STERILE (MISCELLANEOUS) IMPLANT
CONN ST 1/4X3/8  BEN (MISCELLANEOUS) ×4
CONN ST 1/4X3/8 BEN (MISCELLANEOUS) IMPLANT
CONN Y 3/8X3/8X3/8  BEN (MISCELLANEOUS)
CONN Y 3/8X3/8X3/8 BEN (MISCELLANEOUS) IMPLANT
CONT SPECI 4OZ STER CLIK (MISCELLANEOUS) ×2 IMPLANT
CRADLE DONUT ADULT HEAD (MISCELLANEOUS) IMPLANT
DRSG COVADERM 4X14 (GAUZE/BANDAGES/DRESSINGS) ×3 IMPLANT
ELECT CAUTERY BLADE 6.4 (BLADE) ×2 IMPLANT
ELECT REM PT RETURN 9FT ADLT (ELECTROSURGICAL) ×6
ELECTRODE REM PT RTRN 9FT ADLT (ELECTROSURGICAL) ×2 IMPLANT
FELT TEFLON 6X6 (MISCELLANEOUS) ×2 IMPLANT
GAUZE XEROFORM 1X8 LF (GAUZE/BANDAGES/DRESSINGS) ×2 IMPLANT
GLOVE BIO SURGEON STRL SZ 6 (GLOVE) ×2 IMPLANT
GLOVE BIO SURGEON STRL SZ 6.5 (GLOVE) IMPLANT
GLOVE BIO SURGEON STRL SZ7 (GLOVE) IMPLANT
GLOVE BIO SURGEON STRL SZ7.5 (GLOVE) IMPLANT
GLOVE BIO SURGEONS STRL SZ 6.5 (GLOVE)
GLOVE ORTHO TXT STRL SZ7.5 (GLOVE) ×6 IMPLANT
GOWN STRL NON-REIN LRG LVL3 (GOWN DISPOSABLE) ×12 IMPLANT
GRAFT CV 30X10STRG TUBE (Vascular Products) IMPLANT
GRAFT HEMASHIELD 10MM (Vascular Products) ×3 IMPLANT
GRAFT HEMASHIELD 8MM (Vascular Products) ×3 IMPLANT
GRAFT VASC STRG 30X8KNIT (Vascular Products) IMPLANT
GRAFT WOVEN D/V 30DX30L (Vascular Products) ×2 IMPLANT
HEMOSTAT POWDER SURGIFOAM 1G (HEMOSTASIS) ×2 IMPLANT
HEMOSTAT SURGICEL 2X14 (HEMOSTASIS) IMPLANT
HEMOSTAT SURGICEL 2X4 FIBR (HEMOSTASIS) ×2 IMPLANT
INSERT FOGARTY SM (MISCELLANEOUS) ×3 IMPLANT
INSERT FOGARTY XLG (MISCELLANEOUS) ×2 IMPLANT
KIT BASIN OR (CUSTOM PROCEDURE TRAY) ×3 IMPLANT
KIT PAIN CUSTOM (MISCELLANEOUS) ×3 IMPLANT
KIT ROOM TURNOVER OR (KITS) ×3 IMPLANT
KIT SUCTION CATH 14FR (SUCTIONS) ×3 IMPLANT
LINE VENT (MISCELLANEOUS) ×2 IMPLANT
NEEDLE AORTIC AIR ASPIRATING (NEEDLE) IMPLANT
NS IRRIG 1000ML POUR BTL (IV SOLUTION) ×12 IMPLANT
PACK OPEN HEART (CUSTOM PROCEDURE TRAY) ×3 IMPLANT
PAD ARMBOARD 7.5X6 YLW CONV (MISCELLANEOUS) ×6 IMPLANT
PAD DEFIB STAT PADZ MULTI (MISCELLANEOUS) ×2 IMPLANT
PEDIATRIC SUCKERS (MISCELLANEOUS) ×2 IMPLANT
SEALANT SURG COSEAL 8ML (VASCULAR PRODUCTS) ×6 IMPLANT
SET CARDIOPLEGIA MPS 5001102 (MISCELLANEOUS) ×2 IMPLANT
SPONGE GAUZE 4X4 12PLY (GAUZE/BANDAGES/DRESSINGS) ×4 IMPLANT
SPONGE LAP 18X18 X RAY DECT (DISPOSABLE) IMPLANT
SPONGE LAP 4X18 X RAY DECT (DISPOSABLE) IMPLANT
STAPLER VISISTAT 35W (STAPLE) ×5 IMPLANT
STOPCOCK 4 WAY LG BORE MALE ST (IV SETS) IMPLANT
STRIP CLOSURE SKIN 1/2X4 (GAUZE/BANDAGES/DRESSINGS) IMPLANT
SUCKER INTRACARDIAC WEIGHTED (SUCKER) ×2 IMPLANT
SURGIFLO TRUKIT (HEMOSTASIS) ×2 IMPLANT
SUT MNCRL AB 3-0 PS2 18 (SUTURE) IMPLANT
SUT PROLENE 3 0 RB 1 (SUTURE) ×7 IMPLANT
SUT PROLENE 3 0 SH 1 (SUTURE) ×14 IMPLANT
SUT PROLENE 3 0 SH DA (SUTURE) ×3 IMPLANT
SUT PROLENE 4 0 RB 1 (SUTURE) ×51
SUT PROLENE 4 0 SH DA (SUTURE) ×24 IMPLANT
SUT PROLENE 4-0 RB1 .5 CRCL 36 (SUTURE) IMPLANT
SUT PROLENE 5 0 C 1 36 (SUTURE) ×24 IMPLANT
SUT PROLENE 6 0 C 1 30 (SUTURE) ×22 IMPLANT
SUT PROLENE 6 0 CC (SUTURE) IMPLANT
SUT PROLENE 7 0 BV 1 (SUTURE) IMPLANT
SUT PROLENE 7 0 BV1 MDA (SUTURE) IMPLANT
SUT SILK 1 TIES 10X30 (SUTURE) ×2 IMPLANT
SUT SILK 2 0 SH CR/8 (SUTURE) ×2 IMPLANT
SUT STEEL STERNAL CCS#1 18IN (SUTURE) IMPLANT
SUT STEEL SZ 6 DBL 3X14 BALL (SUTURE) IMPLANT
SUT VIC AB 1 CT1 18XCR BRD 8 (SUTURE) IMPLANT
SUT VIC AB 1 CT1 8-18 (SUTURE)
SUT VIC AB 1 CTX 18 (SUTURE) ×8 IMPLANT
SUT VIC AB 1 CTX 27 (SUTURE) ×8 IMPLANT
SUT VIC AB 2-0 CT1 27 (SUTURE)
SUT VIC AB 2-0 CT1 TAPERPNT 27 (SUTURE) IMPLANT
SUT VIC AB 2-0 CTX 36 (SUTURE) ×5 IMPLANT
SUT VIC AB 3-0 SH 27 (SUTURE)
SUT VIC AB 3-0 SH 27X BRD (SUTURE) IMPLANT
SUT VIC AB 3-0 X1 27 (SUTURE) ×6 IMPLANT
SUT VICRYL 4-0 PS2 18IN ABS (SUTURE) IMPLANT
SUTURE E-PAK OPEN HEART (SUTURE) ×3 IMPLANT
SYR 10ML KIT SKIN ADHESIVE (MISCELLANEOUS) ×5 IMPLANT
SYR 30ML LL (SYRINGE) ×2 IMPLANT
SYSTEM SAHARA CHEST DRAIN ATS (WOUND CARE) ×3 IMPLANT
TAPE CLOTH SURG 4X10 WHT LF (GAUZE/BANDAGES/DRESSINGS) ×2 IMPLANT
TOWEL OR 17X24 6PK STRL BLUE (TOWEL DISPOSABLE) ×3 IMPLANT
TOWEL OR 17X26 10 PK STRL BLUE (TOWEL DISPOSABLE) ×3 IMPLANT
TRAY CATH LUMEN 1 20CM STRL (SET/KITS/TRAYS/PACK) ×2 IMPLANT
TUBE CONNECTING 12'X1/4 (SUCTIONS) ×1
TUBE CONNECTING 12X1/4 (SUCTIONS) ×1 IMPLANT
WATER STERILE IRR 1000ML POUR (IV SOLUTION) ×6 IMPLANT
YANKAUER SUCT BULB TIP NO VENT (SUCTIONS) ×6 IMPLANT

## 2011-10-30 NOTE — Progress Notes (Signed)
TCTS  Pt examined in cath lab Type A dissection, chst pain yesterday Plan Emerg repair after CTA to confirm dx procedure d/w patient and family,.

## 2011-10-30 NOTE — Preoperative (Signed)
Beta Blockers   Reason not to administer Beta Blockers:Not Applicable 

## 2011-10-30 NOTE — Interval H&P Note (Signed)
History and Physical Interval Note:  10/30/2011 10:35 AM See full consult note this am by Dr. Patty Sermons.  Gabriel Tran  has presented today for cardiac cath with the diagnosis of chest pain/unstable angina.  The various methods of treatment have been discussed with the patient and family. After consideration of risks, benefits and other options for treatment, the patient has consented to  Procedure(s) (LRB) with comments: LEFT HEART CATHETERIZATION WITH CORONARY ANGIOGRAM (N/A) as a surgical intervention .  The patient's history has been reviewed, patient examined, no change in status, stable for surgery.  I have reviewed the patient's chart and labs.  Questions were answered to the patient's satisfaction.     MCALHANY,CHRISTOPHER

## 2011-10-30 NOTE — CV Procedure (Signed)
    Cardiac Catheterization Operative Report  Gabriel Tran [id number] 9/24/201311:18 AM Crawford Givens, MD  Procedure Performed:  1. Left Heart Catheterization 2. Selective Coronary Angiography 3. Aortic root angiogram  Operator: Verne Carrow, MD  Arterial access site:  Right radial artery.   Indication:  Chest pain in a 56 yo male with history of HTN, pre-diabetes, former tobacco abuse, strong family history of CAD.                                      Procedure Details: The risks, benefits, complications, treatment options, and expected outcomes were discussed with the patient. The patient and/or family concurred with the proposed plan, giving informed consent. The patient was brought to the cath lab after IV hydration was begun and oral premedication was given. The patient was further sedated with Versed and Fentanyl. The right wrist was assessed with an Allens test which was positive. The right wrist was prepped and draped in a sterile fashion. 1% lidocaine was used for local anesthesia. Using the modified Seldinger access technique, a 6 French sheath was placed in the right radial artery. 3 mg Verapamil was given through the sheath. 5000 units IV heparin was given. A JL3.5 catheter was used to engage the left main system. Selective angiography was performed. I was unable to engage the RCA. I then performed an aortic root angiogram. The patient appeared to have a dissection of the aortic root.  The sheath was removed from the right radial artery and a Terumo hemostasis band was applied at the arteriotomy site on the right wrist.    There were no immediate complications. The patient was taken to radiology for STAT CT scan to further assess the aortic root.   Hemodynamic Findings: Central aortic pressure: 153/77  Angiographic Findings:  Left main: No evidence of disease.   Left Anterior Descending Artery: Large caliber vessel that courses to the apex. There are two  moderate sized diagonal branches. There are no obstructive lesions noted.   Circumflex Artery: Large caliber vessel that terminates into a marginal branch. No obstructive disease noted.   Ramus Intermediate: Moderate sized vessel with no disease noted.   Right Coronary Artery: Unable to engage.  Aortic root Angiogram: Angiographic evidence of linear defect in aortic root c/w a dissection.   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 No disease noted in the left coronary system.  3. Unable to engage the RCA  Recommendations: Will obtain STAT CTA thoracic aorta to visualize the entire arch and make further plans regarding the possible dissection. CT surgery has been called for surgical opinion.        Complications:  None. The patient tolerated the procedure well.

## 2011-10-30 NOTE — Consult Note (Signed)
CARDIOLOGY CONSULT NOTE  Patient ID: Gabriel Tran, MRN: [id number], DOB/AGE: [birth year] 56 y.o. Admit date: 10/29/2011   Date of Consult: 10/30/2011 Primary Physician: Gabriel Givens, MD Primary Cardiologist: Gabriel Tran  Chief Complaint: chest pain Reason for Consult: chest pain  HPI: Gabriel Tran is a 56 y/o M with no prior cardiac hx but a hx of HTN, pre-diabetes, prior tobacco use, and significant family hx of CAD who presented to [facility] with complaints of CP. He woke up from a nap yesterday and felt the need to burp, then noticed he had dull, achy throat pain that radiated to his jaw and down substernally to his chest, as well as his back. He took 2 ASA and 2 Tums that gave some relief but did not stop the pain. He went to the firehouse where his BP was 210/100 and he was sweating. EMS was called. He was given 1 SL NTG initially with no relief (but did bring BP down). Subsequent NTG in the ER did help bring the pain down, but gave him a headache. The relief from NTG only lasted about 20 minutes at a time. Oxycodone also helped as well as GI cocktail but the pain came back this morning. CE's are negative x 3. WBC was 13k yesterday, afebrile. CMET unremarkable except for glucose of 111. CXR shows heart size upper limits of normal, no acute findings, appears to be normal mediastinal contours.   EKG this AM shows RBBB (chronic for pt) with some diffuse TWI this AM that was not apparent on yesterday's tracing. He feels that the chest pain is aggravated by laying down, and finds some relief in pushing on his breastbone and getting up to move around. He still has pain this morning even with IV morphine. He denies increased pain with inspiration or particular movement. He denies any nausea, vomiting or syncope. This pain is different than his heartburn. He also endorses recent gradual increase in SOB with activity - he was able to chainsaw wood a few days ago but did so with very heavy  breathing. He is not tachycardic, hypoxic or tachypnic and denies any unilateral leg swelling or redness.   Past Medical History  Diagnosis Date  . Hypertension   . Hyperglycemia     a. A1C 5.9 in Sept 2013.  Marland Kitchen RBBB       Most Recent Cardiac Studies: None   Surgical History:  Past Surgical History  Procedure Date  . Knee surgery     LEFT 1981  . Carpal tunnel release     2004 RIGHT  . Wisdom tooth extraction      Home Meds: Prior to Admission medications   Medication Sig Start Date End Date Taking? Authorizing Provider  amLODipine (NORVASC) 5 MG tablet Take 1 tablet (5 mg total) by mouth daily. 09/06/11  Yes Gabriel Nam, MD  metoprolol (LOPRESSOR) 100 MG tablet Take 1 tablet (100 mg total) by mouth 2 (two) times daily. 09/06/11  Yes Gabriel Nam, MD    Inpatient Medications:    . acetaminophen  650 mg Oral Once  . amLODipine  5 mg Oral Daily  . aspirin EC  81 mg Oral Daily  . docusate sodium  100 mg Oral BID  . enoxaparin (LOVENOX) injection  40 mg Subcutaneous Q24H  . gi cocktail  30 mL Oral Once  . lisinopril  10 mg Oral Daily  . DISCONTD: gi cocktail  30 mL Oral Once    Allergies:  No Known Allergies  History   Social History  . Marital Status: Married    Spouse Name: N/A    Number of Children: N/A  . Years of Education: N/A   Occupational History  . Not on file.   Social History Main Topics  . Smoking status: Former Smoker    Types: Cigarettes    Quit date: 02/05/1990  . Smokeless tobacco: Not on file   Comment: Smoked for 20 years.  . Alcohol Use: 0.0 oz/week     10-12 beer a week on average  . Drug Use: No  . Sexually Active: Not on file   Other Topics Concern  . Not on file   Social History Narrative   Works at [employer], some overseas travel, some [employer] travelMarried 2000, 2 adult step kids      Family History  Problem Relation Age of Onset  . Parkinsonism Mother   . Leukemia Father     AML at 73  . Colon polyps Brother   .  Kidney disease Brother   . Heart disease Brother     CABG at age 25.  . Prostate cancer Neg Hx   . Colon cancer Neg Hx   . Heart disease Father     Angioplasty in his 70s, CABG age 36.     Review of Systems: General: negative for chills, fever, night sweats or weight changes.  Cardiovascular: see above Dermatological: negative for rash Respiratory: negative for cough or wheezing Urologic: negative for hematuria Abdominal: negative for nausea, vomiting, diarrhea, bright red blood per rectum, melena, or hematemesis Neurologic: negative for visual changes, syncope. Rare dizziness lasting less than a second. All other systems reviewed and are otherwise negative except as noted above.  Labs:  [facility] 10/30/11 0255 10/29/11 2117 10/29/11 1601  CKTOTAL -- -- --  CKMB -- -- --  TROPONINI <0.30 <0.30 <0.30   Lab Results  Component Value Date   WBC 13.1* 10/29/2011   HGB 15.2 10/29/2011   HCT 43.6 10/29/2011   MCV 88.8 10/29/2011   PLT 189 10/29/2011    Lab 10/30/11 0255  NA 138  K 4.0  CL 100  CO2 28  BUN 13  CREATININE 1.12  CALCIUM 9.7  PROT 7.1  BILITOT 0.7  ALKPHOS 56  ALT 28  AST 18  GLUCOSE 111*   Lab Results  Component Value Date   CHOL 191 10/30/2011   HDL 53 10/30/2011   LDLCALC 117* 10/30/2011   TRIG 106 10/30/2011    Radiology/Studies:  Dg Chest 2 View  10/29/2011  *RADIOLOGY REPORT*  Clinical Data: Chest pain.  CHEST - 2 VIEW  Comparison: None.  Findings: Trachea is midline.  Heart is at the upper limits of normal in size.  Lungs are low in volume but clear.  Pleural thickening bilaterally may be due to extrapleural fat.  No pleural fluid.  Lower thoracic or upper lumbar compression fracture is age determinate.  IMPRESSION:  1.  No acute findings the chest. 2.  Lower thoracic or upper lumbar compression fracture is age indeterminate.   Original Report Authenticated By: Gabriel Tran, M.D.    Ct Head Wo Contrast  10/29/2011  *RADIOLOGY REPORT*  Clinical Data:  56 year old male with headache, hypertension, systolic blood pressure 230 mmHg.  CT HEAD WITHOUT CONTRAST  Technique:  Contiguous axial images were obtained from the base of the skull through the vertex without contrast.  Comparison: None.  Findings: Visualized paranasal sinuses and mastoids are clear. Visualized  orbits and scalp soft tissues are within normal limits. No acute osseous abnormality identified.  Prominence of the lateral ventricles, but the temporal horns remain diminutive.  No evidence of transependymal edema.  Other ventricles are within normal limits. No midline shift, mass effect, or evidence of mass lesion.  No evidence of cortically based acute infarction identified.  Wallace Cullens- white matter differentiation is within normal limits throughout the brain.  No acute intracranial hemorrhage identified.  No suspicious intracranial vascular hyperdensity.  IMPRESSION: Negative noncontrast CT appearance of the brain; prominence of the lateral ventricles likely is normal anatomic variation.   Original Report Authenticated By: Harley Hallmark, M.D.    EKG:  9/23: sinus bradycardia 54bpm, 1st degree AV block, RBBB, LVH with QRS widening (148), inferior infarct age undetermined, TW I in III noted 9/24: sinus rhythm 68bpm, 1st degree AV block, RBBB, LVH with QRS widening (154), inferior infarct age undetermined, TWI inversion noted in III, avF, V2-V6  Physical Exam: Blood pressure 153/63, pulse 63, temperature 98 F (36.7 C), temperature source Oral, resp. rate 18, height 5\' 8"  (1.727 m), weight 238 lb 14.4 oz (108.364 kg), SpO2 96.00%. General: Well developed, well nourished WM in no acute distress. Head: Normocephalic, atraumatic, sclera non-icteric, no xanthomas, nares are without discharge.  Neck: L carotid bruit noted. R neck without bruit. JVD not elevated. Lungs: Clear bilaterally to auscultation without wheezes, rales, or rhonchi. Breathing is unlabored. Heart: RRR with S1 S2. +SEM RUSB. No rubs  or gallops appreciated. Abdomen: Soft, non-tender, non-distended with normoactive bowel sounds. No hepatomegaly. No rebound/guarding. No obvious abdominal masses. Msk:  Strength and tone appear normal for age. Extremities: No clubbing or cyanosis. No edema.  Distal pedal pulses are 2+ and equal bilaterally - upper and lower. Neuro: Alert and oriented X 3. No facial asymmetry. No focal deficit. Moves all extremities spontaneously. Psych:  Responds to questions appropriately with a normal affect.   Assessment and Plan:  Mr. Neuman is a 56 y/o M with hx of HTN, prior tobacco abuse, hyperglycemia, family hx of CAD who presents with complaints of CP and DOE. CE's negative, but EKG appears changed with ongoing pain.  1. Chest pain/DOE concerning for unstable angina. Less likely PE given that he is not tachycardic, hypoxic. EKG changes noted with diffuse TWI this AM. Will rx 324mg  ASA this AM, agree with 81mg  daily thereafter. Restart BB at lower dose given bradycardia. Plan cath urgently. Will hold off heparin given that he will be going to cath shortly. If no CAD, consider eval for dissection. 2. HTN, accelerated - BP improved but still high. Primary team has added lisinopril. Add back lower dose BB. 3. Systolic murmur ?AS - recommend 2D echocardiogram. 4. L carotid bruit - unsure if just from radiation from SEM. Will need carotid dopplers at some point. 5. EtOH use - [2-4 beers 2-3x/week] - educated regarding the importance on cutting down particularly with his comorbid HTN 6. Obesity with BMI 36.4  Signed, Dayna Dunn PA-C 10/30/2011, 9:12 AM  This middle-aged man with multiple risk factors for CAD including very strong family history presents with stuttering chest pain only transiently relieved by SL NTG but results in severe headache.  His story is concerning for unstable angina.  EKG today shows Gabriel lateral wall T wave inversions.  Enzymes negative. Exam shows soft basilar systolic ejection  murmur and soft left carotid bruit. Lungs clear and his peripheral pulses are equal and symmetrical. Will proceed with urgent cardiac cath because of continuing  chest pain. Agree with assessment and plan as noted above.

## 2011-10-30 NOTE — Consult Note (Signed)
[address]            [place] [address]          [phone]       Gabriel Tran [facility] Medical Record #[id number] Date of Birth: 10-18-1955  No ref. provider found Crawford Givens, MD  Chief Complaint:    Chief Complaint  Patient presents with  . Chest Pain    History of Present Illness:    56 year old obese hypertensive Caucasian male with past history of smoking presented to the emergency department 24 hours ago with chest pain, headache and hypertension with blood pressure 210 systolic. Head CT negative. Cardiac enzymes negative. Chest x-ray no active disease. Chest pain persisted overnight. Cardiac catheterization recommended by cardiology. This was performed via right radial artery and demonstrated patent left main and left system. Right coronary could not be engaged. Root shot demonstrated intimalFlap. 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   Current Activity/ Functional Status: Normal functional status   Past Medical History  Diagnosis Date  . Hypertension   . Hyperglycemia     a. A1C 5.9 in Sept 2013.  Marland Kitchen RBBB     Past Surgical History  Procedure Date  . Knee surgery     LEFT 1981  . Carpal tunnel release     2004 RIGHT  . Wisdom tooth extraction     History  Smoking status  . Former Smoker  . Types: Cigarettes  . Quit date: 02/05/1990  Smokeless tobacco  . Not on file  Comment: Smoked for 20 years.    History  Alcohol Use  . 0.0 oz/week    10-12 beer a week on average    History   Social History  . Marital Status: Married    Spouse Name: N/A    Number of Children: N/A  . Years of Education: N/A   Occupational History  . Not on file.   Social History Main Topics  . Smoking status: Former Smoker    Types: Cigarettes    Quit date: 02/05/1990  . Smokeless tobacco: Not on file   Comment: Smoked for 20 years.  . Alcohol Use:  0.0 oz/week     10-12 beer a week on average  . Drug Use: No  . Sexually Active: Not on file   Other Topics Concern  . Not on file   Social History Narrative   Works at [employer], some overseas travel, some [employer] travelMarried 2000, 2 adult step kids     No Known Allergies  Current Facility-Administered Medications  Medication Dose Route Frequency Provider Last Rate Last Dose  . 0.9 %  sodium chloride infusion  250 mL Intravenous PRN Dayna N Dunn, PA      . acetaminophen (TYLENOL) tablet 650 mg  650 mg Oral Q6H PRN Catarina Hartshorn, MD   650 mg at 10/29/11 2243   Or  . acetaminophen (TYLENOL) suppository 650 mg  650 mg Rectal Q6H PRN Catarina Hartshorn, MD      . acetaminophen (TYLENOL) tablet 650 mg  650 mg Oral Once Gwyneth Sprout, MD   650 mg at 10/29/11 1558  . aminocaproic acid (AMICAR) 10 g in sodium chloride 0.9 % 100 mL infusion   Intravenous To OR Kerin Perna, MD      . amLODipine (NORVASC) tablet 5 mg  5 mg Oral Daily Catarina Hartshorn, MD      . aspirin 81 MG chewable tablet           . aspirin chewable tablet 324 mg  324 mg Oral Once [employer], PA   324 mg at 10/30/11 0951  . aspirin EC tablet 81 mg  81 mg Oral Daily Dayna N Dunn, PA      . bisacodyl (DULCOLAX) EC tablet 5 mg  5 mg Oral Once Kerin Perna, MD      . cefUROXime (ZINACEF) 1.5 g in dextrose 5 % 50 mL IVPB  1.5 g Intravenous To OR Kerin Perna, MD      . cefUROXime (ZINACEF) 750 mg in dextrose 5 % 50 mL IVPB  750 mg Intravenous To OR Kerin Perna, MD      . chlorhexidine (HIBICLENS) 4 % liquid 4 application  60 mL Topical Once Kerin Perna, MD      . chlorhexidine (HIBICLENS) 4 % liquid 4 application  60 mL Topical Once Kerin Perna, MD      . dexmedetomidine (PRECEDEX) 400 mcg / 100 mL infusion  0.1-0.7 mcg/kg/hr Intravenous To OR Kerin Perna, MD      . docusate sodium (COLACE) capsule 100 mg  100 mg Oral BID Catarina Hartshorn, MD      . DOPamine (INTROPIN) 800 mg in dextrose 5 % 250 mL infusion  2-20  mcg/kg/min Intravenous To OR Kerin Perna, MD      . EPINEPHrine (ADRENALIN) 4,000 mcg in dextrose 5 % 250 mL infusion  0.5-20 mcg/min Intravenous To OR Kerin Perna, MD      . fentaNYL (SUBLIMAZE) 0.05 MG/ML injection           . fentaNYL (SUBLIMAZE) 0.05 MG/ML injection           . gi cocktail (Maalox,Lidocaine,Donnatal)  30 mL Oral Once Gwyneth Sprout, MD   30 mL at 10/29/11 1719  . heparin 1000 UNIT/ML injection           . heparin 2,500 Units, papaverine 30 mg in electrolyte-148 (PLASMALYTE-148) 500 mL irrigation   Irrigation To OR Kerin Perna, MD      . heparin 2-0.9 UNIT/ML-% infusion           . hydrALAZINE (APRESOLINE) injection 10 mg  10 mg Intravenous Q4H PRN Catarina Hartshorn, MD      . insulin regular (NOVOLIN R,HUMULIN R) 1 Units/mL in sodium chloride 0.9 % 100 mL infusion   Intravenous To OR Kerin Perna, MD      . iohexol (OMNIPAQUE) 350 MG/ML injection 100 mL  100 mL Intravenous Once PRN Medication Radiologist, MD   100 mL at 10/30/11 1200  . lidocaine (XYLOCAINE) 1 % injection           . lisinopril (PRINIVIL,ZESTRIL) tablet 10 mg  10 mg Oral Daily David Tat, MD      . magnesium sulfate (IV Push/IM) injection 40 mEq  40 mEq Other To OR Kerin Perna, MD      . metoprolol (LOPRESSOR) 1 MG/ML injection           . metoprolol tartrate (LOPRESSOR) tablet 12.5 mg  12.5 mg Oral Once Kerin Perna, MD      . metoprolol tartrate (LOPRESSOR) tablet 25 mg  25 mg Oral BID Dayna N Dunn, PA      . midazolam (VERSED) 2 MG/2ML injection           .  morphine 2 MG/ML injection 2 mg  2 mg Intravenous Q4H PRN Catarina Hartshorn, MD   2 mg at 10/30/11 0920  . morphine 2 MG/ML injection           . nitroGLYCERIN (NITROSTAT) SL tablet 0.4 mg  0.4 mg Sublingual Q5 Min x 3 PRN Catarina Hartshorn, MD   0.4 mg at 10/29/11 2015  . nitroGLYCERIN (NTG ON-CALL) 0.2 mg/mL injection           . nitroGLYCERIN 0.2 mg/mL in dextrose 5 % infusion  2-200 mcg/min Intravenous To OR Kerin Perna, MD      . ondansetron  [facility]) tablet 4 mg  4 mg Oral Q6H PRN Catarina Hartshorn, MD       Or  . ondansetron [facility]) injection 4 mg  4 mg Intravenous Q6H PRN Catarina Hartshorn, MD      . oxyCODONE (Oxy IR/ROXICODONE) immediate release tablet 5 mg  5 mg Oral Q4H PRN Catarina Hartshorn, MD   5 mg at 10/30/11 0836  . phenylephrine (NEO-SYNEPHRINE) 20,000 mcg in dextrose 5 % 250 mL infusion  30-200 mcg/min Intravenous To OR Kerin Perna, MD      . potassium chloride injection 80 mEq  80 mEq Other To OR Kerin Perna, MD      . sodium chloride 0.9 % injection 3 mL  3 mL Intravenous Q12H Dayna N Dunn, PA      . sodium chloride 0.9 % injection 3 mL  3 mL Intravenous PRN Dayna N Dunn, PA      . temazepam (RESTORIL) capsule 15 mg  15 mg Oral Once PRN Kerin Perna, MD      . vancomycin (VANCOCIN) 1,250 mg in sodium chloride 0.9 % 250 mL IVPB  1,250 mg Intravenous To OR Kerin Perna, MD      . verapamil (ISOPTIN) 2.5 MG/ML injection           . DISCONTD: aspirin EC tablet 81 mg  81 mg Oral Daily Catarina Hartshorn, MD      . DISCONTD: enoxaparin (LOVENOX) injection 40 mg  40 mg Subcutaneous Q24H Catarina Hartshorn, MD   40 mg at 10/29/11 2134  . DISCONTD: gi cocktail (Maalox,Lidocaine,Donnatal)  30 mL Oral Once Johnney Ou, MD         Family History  Problem Relation Age of Onset  . Parkinsonism Mother   . Leukemia Father     AML at 79  . Colon polyps Brother   . Kidney disease Brother   . Heart disease Brother     CABG at age 50.  . Prostate cancer Neg Hx   . Colon cancer Neg Hx   . Heart disease Father     Angioplasty in his 69s, CABG age 23.     Review of Systems:  history of hypertension and pre-diabetes    Cardiac Review of Systems: Y or N  Chest Pain [ Y.   ]  Resting SOB [n   ]n Exertional SOB  [n  ]  Orthopnea [ n ]   Pedal Edema [ n  ]    Palpitations [ n ] Syncope  [  ]n   Presyncope [n   ]  General Review of Systems: [Y] = yes [  ]=no Constitional: recent weight change [n  ]; anorexia [  ]; fatigue [  ]; nausea [  ]; night sweats [   ]; fever [  n]; or chills [  ];  Dental: poor dentition[  ]; Last Dentist visit: > 1 yr  Eye : blurred vision [  ]; diplopia [   ]; vision changes [  ];  Amaurosis fugax[  ]; Resp: cough [  ];  wheezing[  ];  hemoptysis[n  ]; shortness of breath[  ]; paroxysmal nocturnal dyspnea[  ]; dyspnea on exertion[  ]; or orthopnea[  ];  GI:  gallstones[  ], vomiting[  ];  dysphagia[  ]; melena[  ];  hematochezia [  ]; heartburn[  ];   Hx of  Colonoscopy[  ]; GU: kidney stones [  ]; hematuria[  ];   dysuria [  ];  nocturia[  ];  history of     obstruction [  ];                 Skin: rash, swelling[  ];, hair loss[  ];  peripheral edema[  ];  or itching[  ]; Musculosketetal: myalgias[  ];  joint swelling[  ];  joint erythema[  ];  joint pain[  ];  back pain[  ];  Heme/Lymph: bruising[  ];  bleeding[  ];  anemia[  ];  Neuro: TIA[  ];  headaches[y  ];  stroke[n  ];  vertigo[  ];  seizures[  ];   paresthesias[  ];  difficulty walking[  ];  Psych:depression[  ]; anxiety[  ];  Endocrine: diabetes[  ];  thyroid dysfunction[  ];  Immunizations: Flu [  ]; Pneumococcal[  ];  Other:  Physical Exam: BP 153/63  Pulse 72  Temp 98 F (36.7 C) (Oral)  Resp 18  Ht 5\' 8"  (1.727 m)  Wt 238 lb 14.4 oz (108.364 kg)  BMI 36.32 kg/m2  SpO2 96%  General-responsive but anxious in the cardiac cath lab  HEENT normocephalic dentition good   neck bilateral carotid pulses without mass or adenopathy Thorax no deformity or tenderness, breath sounds clear Cardiac regular rhythm without murmur Abdomen soft obese without pulsatile mass Extremities positive pulses no edema , right wrist with compressive band from radial artery cath site Neurologic oriented no focal motor deficit   Diagnostic Studies & Laboratory data:   Cardiac cath, head CT, CTA of the thoracic aorta reviewed  Recent  Radiology Findings:   Dg Chest 2 View  10/29/2011  *RADIOLOGY REPORT*  Clinical Data: Chest pain.  CHEST - 2 VIEW  Comparison: None.  Findings: Trachea is midline.  Heart is at the upper limits of normal in size.  Lungs are low in volume but clear.  Pleural thickening bilaterally may be due to extrapleural fat.  No pleural fluid.  Lower thoracic or upper lumbar compression fracture is age determinate.  IMPRESSION:  1.  No acute findings the chest. 2.  Lower thoracic or upper lumbar compression fracture is age indeterminate.   Original Report Authenticated By: Reyes Ivan, M.D.    Ct Head Wo Contrast  10/29/2011  *RADIOLOGY REPORT*  Clinical Data: 56 year old male with headache, hypertension, systolic blood pressure 230 mmHg.  CT HEAD WITHOUT CONTRAST  Technique:  Contiguous axial images were obtained from the base of the skull through the vertex without contrast.  Comparison: None.  Findings: Visualized paranasal sinuses and mastoids are clear. Visualized orbits and scalp soft tissues are within normal limits. No acute osseous abnormality identified.  Prominence of the lateral ventricles, but the temporal horns remain diminutive.  No evidence of transependymal edema.  Other ventricles are within normal limits. No midline shift, mass effect, or evidence  of mass lesion.  No evidence of cortically based acute infarction identified.  Wallace Cullens- white matter differentiation is within normal limits throughout the brain.  No acute intracranial hemorrhage identified.  No suspicious intracranial vascular hyperdensity.  IMPRESSION: Negative noncontrast CT appearance of the brain; prominence of the lateral ventricles likely is normal anatomic variation.   Original Report Authenticated By: Harley Hallmark, M.D.       Recent Lab Findings: Lab Results  Component Value Date   WBC 13.1* 10/29/2011   HGB 15.2 10/29/2011   HCT 43.6 10/29/2011   PLT 189 10/29/2011   GLUCOSE 111* 10/30/2011   CHOL 191 10/30/2011   TRIG 106  10/30/2011   HDL 53 10/30/2011   LDLDIRECT 142.8 08/31/2011   LDLCALC 117* 10/30/2011   ALT 28 10/30/2011   AST 18 10/30/2011   NA 138 10/30/2011   K 4.0 10/30/2011   CL 100 10/30/2011   CREATININE 1.12 10/30/2011   BUN 13 10/30/2011   CO2 28 10/30/2011   TSH 1.148 10/29/2011   INR 0.96 10/29/2011   HGBA1C 5.9* 10/29/2011      Assessment / Plan:      Ascending thoracic dissection- Type A Will proceed with emergency repair. Procedure risks and benefits discussed with patient and family. They understand the risk of stroke leading death MI with surgery in the extremely high mortality rate without surgery. They agree to proceed with emergency surgical repair.

## 2011-10-30 NOTE — Anesthesia Preprocedure Evaluation (Addendum)
Anesthesia Evaluation  Patient identified by MRN, date of birth, ID band Patient awake    Reviewed: Allergy & Precautions, H&P , NPO status , Patient's Chart, lab work & pertinent test results, reviewed documented beta blocker date and time   Airway Mallampati: II TM Distance: >3 FB Neck ROM: Full    Dental  (+) Teeth Intact and Dental Advisory Given   Pulmonary former smoker,          Cardiovascular hypertension, Pt. on medications and Pt. on home beta blockers + dysrhythmias     Neuro/Psych    GI/Hepatic   Endo/Other  Pre diabetes  Renal/GU      Musculoskeletal   Abdominal   Peds  Hematology   Anesthesia Other Findings   Reproductive/Obstetrics                          Anesthesia Physical Anesthesia Plan  ASA: IV and Emergent  Anesthesia Plan: General   Post-op Pain Management:    Induction: Intravenous and Rapid sequence  Airway Management Planned: Oral ETT  Additional Equipment: Arterial line, TEE, PA Cath, CVP, Ultrasound Guidance Line Placement and 3D TEE  Intra-op Plan:   Post-operative Plan: Post-operative intubation/ventilation  Informed Consent: I have reviewed the patients History and Physical, chart, labs and discussed the procedure including the risks, benefits and alternatives for the proposed anesthesia with the patient or authorized representative who has indicated his/her understanding and acceptance.   Dental advisory given  Plan Discussed with: CRNA, Anesthesiologist and Surgeon  Anesthesia Plan Comments:         Anesthesia Quick Evaluation

## 2011-10-30 NOTE — Transfer of Care (Signed)
Immediate Anesthesia Transfer of Care Note  Patient: Gabriel Tran  Procedure(s) Performed: Procedure(s) (LRB) with comments: THORACIC ASCENDING ANEURYSM REPAIR (AAA) (N/A) - Ascending aortic dissection repair, arch reconstruction, aortic valve repair  Patient Location: SICU  Anesthesia Type: General  Level of Consciousness: sedated and unresponsive  Airway & Oxygen Therapy: Patient remains intubated per anesthesia plan  Post-op Assessment: Report given to PACU RN and Post -op Vital signs reviewed and stable  Post vital signs: Reviewed  Complications: No apparent anesthesia complications

## 2011-10-30 NOTE — Progress Notes (Addendum)
[facility] PROGRESS NOTE  ALISTAR MCENERY MWU:[id number] DOB: Aug 02, 1955 DOA: 10/29/2011 PCP: Crawford Givens, MD  Assessment/Plan: Hypertensive urgency  -better controlled, but still not optimal -Hold metoprolol due to bradycardia -Continue amlodipine, and lisinopril -Patient does not want increased dose of amlodipine due to peripheral edema  -Hydralazine when necessary systolic blood pressure >180  -Urine toxicology negative -TSH unremarkable chest pain  -Occurred at rest when he woke up  -Still having intermittent chest discomfort overnight -Serial troponins negative -Last stress test in 2001 negative  -When necessary morphine -Consult cardiology -Brother had coronary artery bypass graft at age 64 -Father had coronary disease in his late 68s Right bundle branch block  -Patient states that he has known about this since 1996  -new ST changes on EKG CKD stage II  Dyslipidemia  -Check morning lipids  -LDL 117--as of now with current risk factors prior to further workup, he is at goal     Disposition Plan:   Home when medically stable    Procedures/Studies: Dg Chest 2 View  10/29/2011  *RADIOLOGY REPORT*  Clinical Data: Chest pain.  CHEST - 2 VIEW  Comparison: None.  Findings: Trachea is midline.  Heart is at the upper limits of normal in size.  Lungs are low in volume but clear.  Pleural thickening bilaterally may be due to extrapleural fat.  No pleural fluid.  Lower thoracic or upper lumbar compression fracture is age determinate.  IMPRESSION:  1.  No acute findings the chest. 2.  Lower thoracic or upper lumbar compression fracture is age indeterminate.   Original Report Authenticated By: Reyes Ivan, M.D.    Ct Head Wo Contrast  10/29/2011  *RADIOLOGY REPORT*  Clinical Data: 56 year old male with headache, hypertension, systolic blood pressure 230 mmHg.  CT HEAD WITHOUT CONTRAST  Technique:  Contiguous axial images were obtained from the base of the skull  through the vertex without contrast.  Comparison: None.  Findings: Visualized paranasal sinuses and mastoids are clear. Visualized orbits and scalp soft tissues are within normal limits. No acute osseous abnormality identified.  Prominence of the lateral ventricles, but the temporal horns remain diminutive.  No evidence of transependymal edema.  Other ventricles are within normal limits. No midline shift, mass effect, or evidence of mass lesion.  No evidence of cortically based acute infarction identified.  Wallace Cullens- white matter differentiation is within normal limits throughout the brain.  No acute intracranial hemorrhage identified.  No suspicious intracranial vascular hyperdensity.  IMPRESSION: Negative noncontrast CT appearance of the brain; prominence of the lateral ventricles likely is normal anatomic variation.   Original Report Authenticated By: Harley Hallmark, M.D.          Subjective: Patient continued to have intermittent chest discomfort overnight better relieved with GI cocktail tthan nitroglycerin. Denies shortness of breath, palpitations, nausea, vomiting, diarrhea, abdominal pain, dizziness, fevers, chills  Objective: Filed Vitals:   10/29/11 2000 10/29/11 2059 10/30/11 0300 10/30/11 0503  BP: 136/76 172/76 164/80 153/63  Pulse:  51  63  Temp:  98.1 F (36.7 C)  98 F (36.7 C)  TempSrc:  Oral  Oral  Resp:  16  18  Height:  5\' 8"  (1.727 m)    Weight:  108.364 kg (238 lb 14.4 oz)    SpO2:  99%  96%    Intake/Output Summary (Last 24 hours) at 10/30/11 0823 Last data filed at 10/30/11 0600  Gross per 24 hour  Intake    480 ml  Output  0 ml  Net    480 ml   Weight change:  Exam:   General:  Pt is alert, follows commands appropriately, not in acute distress  HEENT: No icterus, No thrush, [place]/AT  Cardiovascular: RRR, S1/S2, no rubs, no gallops  Respiratory: Clear to auscultation bilaterally, no wheezing, no crackles, no rhonchi  Abdomen: Soft/+BS, non tender, non  distended, no guarding  Extremities: No edema, No lymphangitis, No petechiae, No rashes, no synovitis  Data Reviewed: Basic Metabolic Panel:  Lab 10/30/11 1610 10/29/11 1601  NA 138 137  K 4.0 4.7  CL 100 99  CO2 28 29  GLUCOSE 111* 129*  BUN 13 14  CREATININE 1.12 1.13  CALCIUM 9.7 10.9*  MG -- --  PHOS -- --   Liver Function Tests:  Lab 10/30/11 0255  AST 18  ALT 28  ALKPHOS 56  BILITOT 0.7  PROT 7.1  ALBUMIN 3.7   No results found for this basename: LIPASE:5,AMYLASE:5 in the last 168 hours No results found for this basename: AMMONIA:5 in the last 168 hours CBC:  Lab 10/29/11 1601  WBC 13.1*  NEUTROABS 10.7*  HGB 15.2  HCT 43.6  MCV 88.8  PLT 189   Cardiac Enzymes:  Lab 10/30/11 0255 10/29/11 2117 10/29/11 1601  CKTOTAL -- -- --  CKMB -- -- --  CKMBINDEX -- -- --  TROPONINI <0.30 <0.30 <0.30   BNP: No components found with this basename: POCBNP:5 CBG: No results found for this basename: GLUCAP:5 in the last 168 hours  No results found for this or any previous visit (from the past 240 hour(s)).   Scheduled Meds:   . acetaminophen  650 mg Oral Once  . amLODipine  5 mg Oral Daily  . aspirin EC  81 mg Oral Daily  . docusate sodium  100 mg Oral BID  . enoxaparin (LOVENOX) injection  40 mg Subcutaneous Q24H  . gi cocktail  30 mL Oral Once  . lisinopril  10 mg Oral Daily  . DISCONTD: gi cocktail  30 mL Oral Once   Continuous Infusions:    Lalitha Ilyas, DO  [facility] Pager [phone]  If 7PM-7AM, please contact night-coverage [email or web address] Password [email or web address] 10/30/2011, 8:23 AM   LOS: 1 day

## 2011-10-30 NOTE — Anesthesia Postprocedure Evaluation (Signed)
  Anesthesia Post-op Note  Patient: Gabriel Tran  Procedure(s) Performed: Procedure(s) (LRB) with comments: THORACIC ASCENDING ANEURYSM REPAIR (AAA) (N/A) - Ascending aortic dissection repair, arch reconstruction, aortic valve repair  Patient Location: SICU  Anesthesia Type: General  Level of Consciousness: awake  Airway and Oxygen Therapy: Patient remains intubated per anesthesia plan and Patient placed on Ventilator (see vital sign flow sheet for setting)  Post-op Pain: none  Post-op Assessment: Post-op Vital signs reviewed  Post-op Vital Signs: Reviewed  Complications: No apparent anesthesia complications

## 2011-10-30 NOTE — Brief Op Note (Signed)
10/29/2011 - 10/30/2011  8:46 PM  PATIENT:  Gabriel Tran  56 y.o. male  PRE-OPERATIVE DIAGNOSIS:  type A  aortic dissection  POST-OPERATIVE DIAGNOSIS:  type A  aortic dissection  PROCEDURE:  EMERGENT REPAIR of THORACIC ASCENDING ANEURYSM REPAIR (AAA)  Using a Hemashield Straight Graft (30x30, valve sparing) and graft to the Innominate Artery  SURGEON:  Surgeon(s) and Role:    * Kerin Perna, MD - Primary    * Delight Ovens, MD - Assisting  PHYSICIAN ASSISTANT: Doree Fudge PA-C   ANESTHESIA:   general  EBL:  Total I/O In: 700 [I.V.:700] Out: 320 [Urine:320]  DRAINS:  Chest Tube(s) in the Mediastinal and Pleural spaces   COUNTS CORRECT:  YES  DICTATION: .Dragon Dictation  PLAN OF CARE: Admit to inpatient   PATIENT DISPOSITION:  ICU - intubated and hemodynamically stable.   Delay start of Pharmacological VTE agent (>24hrs) due to surgical blood loss or risk of bleeding: yes  PRE OP WEIGHT: 108 kg

## 2011-10-30 NOTE — ED Provider Notes (Signed)
I saw and evaluated the patient, reviewed the resident's note and I agree with the findings and plan. I saw and evaluated the EKG and agree with the resident's interpretation. Patient with a concerning history of chest pain today that radiated up into his neck causing shortness of breath and nausea. Patient has been evaluated with an echo in the past for palpitations years ago but no recent evaluation. He has a strong family history of coronary disease and given his story concern for ACS. Initial labs and EKG within normal limits. Will admit patient for further care. He had a normal exam  Gwyneth Sprout, MD 10/30/11 2359

## 2011-10-31 ENCOUNTER — Inpatient Hospital Stay (HOSPITAL_COMMUNITY): Payer: PRIVATE HEALTH INSURANCE

## 2011-10-31 ENCOUNTER — Encounter (HOSPITAL_COMMUNITY): Payer: Self-pay | Admitting: Cardiothoracic Surgery

## 2011-10-31 DIAGNOSIS — I7100 Dissection of unspecified site of aorta: Secondary | ICD-10-CM

## 2011-10-31 DIAGNOSIS — I10 Essential (primary) hypertension: Secondary | ICD-10-CM

## 2011-10-31 LAB — POCT I-STAT 3, ART BLOOD GAS (G3+)
Acid-Base Excess: 1 mmol/L (ref 0.0–2.0)
Acid-Base Excess: 2 mmol/L (ref 0.0–2.0)
Bicarbonate: 25.8 mEq/L — ABNORMAL HIGH (ref 20.0–24.0)
O2 Saturation: 96 %
O2 Saturation: 88 %
Patient temperature: 37.9
TCO2: 27 mmol/L (ref 0–100)
TCO2: 29 mmol/L (ref 0–100)
pCO2 arterial: 42 mmHg (ref 35.0–45.0)
pCO2 arterial: 44.5 mmHg (ref 35.0–45.0)
pH, Arterial: 7.427 (ref 7.350–7.450)
pO2, Arterial: 90 mmHg (ref 80.0–100.0)
pO2, Arterial: 55 mmHg — ABNORMAL LOW (ref 80.0–100.0)

## 2011-10-31 LAB — PREPARE PLATELET PHERESIS: Unit division: 0

## 2011-10-31 LAB — POCT I-STAT, CHEM 8
Creatinine, Ser: 1.2 mg/dL (ref 0.50–1.35)
Glucose, Bld: 185 mg/dL — ABNORMAL HIGH (ref 70–99)
HCT: 29 % — ABNORMAL LOW (ref 39.0–52.0)
Hemoglobin: 9.9 g/dL — ABNORMAL LOW (ref 13.0–17.0)
Potassium: 3.6 mEq/L (ref 3.5–5.1)
Sodium: 146 mEq/L — ABNORMAL HIGH (ref 135–145)
TCO2: 24 mmol/L (ref 0–100)

## 2011-10-31 LAB — BASIC METABOLIC PANEL
BUN: 12 mg/dL (ref 6–23)
CO2: 27 mEq/L (ref 19–32)
Calcium: 7.7 mg/dL — ABNORMAL LOW (ref 8.4–10.5)
Glucose, Bld: 146 mg/dL — ABNORMAL HIGH (ref 70–99)
Potassium: 4.4 mEq/L (ref 3.5–5.1)
Sodium: 140 mEq/L (ref 135–145)

## 2011-10-31 LAB — CBC
Hemoglobin: 10.9 g/dL — ABNORMAL LOW (ref 13.0–17.0)
Hemoglobin: 10.5 g/dL — ABNORMAL LOW (ref 13.0–17.0)
MCH: 30.6 pg (ref 26.0–34.0)
MCH: 31.3 pg (ref 26.0–34.0)
MCHC: 34.7 g/dL (ref 30.0–36.0)
MCHC: 34.8 g/dL (ref 30.0–36.0)
MCV: 88.2 fL (ref 78.0–100.0)
MCV: 90.1 fL (ref 78.0–100.0)
RBC: 3.56 MIL/uL — ABNORMAL LOW (ref 4.22–5.81)
RBC: 3.35 MIL/uL — ABNORMAL LOW (ref 4.22–5.81)

## 2011-10-31 LAB — PREPARE FRESH FROZEN PLASMA: Unit division: 0

## 2011-10-31 LAB — GLUCOSE, CAPILLARY
Glucose-Capillary: 137 mg/dL — ABNORMAL HIGH (ref 70–99)
Glucose-Capillary: 133 mg/dL — ABNORMAL HIGH (ref 70–99)
Glucose-Capillary: 143 mg/dL — ABNORMAL HIGH (ref 70–99)
Glucose-Capillary: 143 mg/dL — ABNORMAL HIGH (ref 70–99)
Glucose-Capillary: 111 mg/dL — ABNORMAL HIGH (ref 70–99)
Glucose-Capillary: 90 mg/dL (ref 70–99)
Glucose-Capillary: 153 mg/dL — ABNORMAL HIGH (ref 70–99)
Glucose-Capillary: 165 mg/dL — ABNORMAL HIGH (ref 70–99)

## 2011-10-31 LAB — MAGNESIUM: Magnesium: 3.1 mg/dL — ABNORMAL HIGH (ref 1.5–2.5)

## 2011-10-31 LAB — MRSA PCR SCREENING: MRSA by PCR: NEGATIVE

## 2011-10-31 MED ORDER — ACETAMINOPHEN 500 MG PO TABS
1000.0000 mg | ORAL_TABLET | Freq: Four times a day (QID) | ORAL | Status: DC
  Administered 2011-10-31 – 2011-11-03 (×11): 1000 mg via ORAL
  Filled 2011-10-31 (×13): qty 2

## 2011-10-31 MED ORDER — INSULIN ASPART 100 UNIT/ML ~~LOC~~ SOLN
0.0000 [IU] | SUBCUTANEOUS | Status: DC
  Administered 2011-10-31: 2 [IU] via SUBCUTANEOUS
  Administered 2011-10-31: 4 [IU] via SUBCUTANEOUS
  Administered 2011-10-31: 2 [IU] via SUBCUTANEOUS
  Administered 2011-11-01: 4 [IU] via SUBCUTANEOUS
  Administered 2011-11-01 – 2011-11-14 (×20): 2 [IU] via SUBCUTANEOUS

## 2011-10-31 MED ORDER — INSULIN GLARGINE 100 UNIT/ML ~~LOC~~ SOLN
14.0000 [IU] | SUBCUTANEOUS | Status: DC
  Administered 2011-10-31: 14 [IU] via SUBCUTANEOUS

## 2011-10-31 MED ORDER — FENTANYL CITRATE 0.05 MG/ML IJ SOLN
50.0000 ug | INTRAMUSCULAR | Status: DC | PRN
  Administered 2011-10-31 – 2011-11-03 (×7): 50 ug via INTRAVENOUS
  Filled 2011-10-31 (×4): qty 2

## 2011-10-31 MED ORDER — METOPROLOL TARTRATE 25 MG/10 ML ORAL SUSPENSION
25.0000 mg | Freq: Two times a day (BID) | ORAL | Status: DC
  Filled 2011-10-31 (×7): qty 10

## 2011-10-31 MED ORDER — INSULIN GLARGINE 100 UNIT/ML ~~LOC~~ SOLN
18.0000 [IU] | Freq: Two times a day (BID) | SUBCUTANEOUS | Status: DC
  Administered 2011-10-31 – 2011-11-01 (×3): 18 [IU] via SUBCUTANEOUS

## 2011-10-31 MED ORDER — ACETAMINOPHEN 160 MG/5ML PO SOLN: 975.0000 mg | Freq: Four times a day (QID) | ORAL | Status: DC

## 2011-10-31 MED ORDER — SODIUM CHLORIDE 0.9 % IV SOLN: INTRAVENOUS | Status: DC

## 2011-10-31 MED ORDER — FUROSEMIDE 10 MG/ML IJ SOLN
20.0000 mg | Freq: Once | INTRAMUSCULAR | Status: AC
  Administered 2011-10-31: 20 mg via INTRAVENOUS

## 2011-10-31 MED ORDER — POTASSIUM CHLORIDE 10 MEQ/50ML IV SOLN
10.0000 meq | Freq: Once | INTRAVENOUS | Status: AC
  Administered 2011-10-31: 10 meq via INTRAVENOUS

## 2011-10-31 MED ORDER — FUROSEMIDE 10 MG/ML IJ SOLN
8.0000 mg/h | INTRAVENOUS | Status: DC
  Administered 2011-10-31: 8 mg/h via INTRAVENOUS
  Filled 2011-10-31 (×4): qty 25

## 2011-10-31 MED ORDER — POTASSIUM CHLORIDE 10 MEQ/50ML IV SOLN
10.0000 meq | INTRAVENOUS | Status: AC
Start: 2011-10-31 — End: 2011-10-31
  Administered 2011-10-31 (×3): 10 meq via INTRAVENOUS
  Filled 2011-10-31: qty 50

## 2011-10-31 MED ORDER — METOPROLOL TARTRATE 25 MG PO TABS
25.0000 mg | ORAL_TABLET | Freq: Two times a day (BID) | ORAL | Status: DC
  Administered 2011-10-31 – 2011-11-03 (×6): 25 mg via ORAL
  Filled 2011-10-31 (×7): qty 1

## 2011-10-31 NOTE — Progress Notes (Signed)
S/p repair aortic dissection  Up in chair  Pain control OK  Requiring 50% VM  BP 122/63  Pulse 113  Temp 98 F (36.7 C) (Oral)  Resp 19  Ht 5\' 8"  (1.727 m)  Wt 252 lb 13.9 oz (114.7 kg)  BMI 38.45 kg/m2  SpO2 91%   Intake/Output Summary (Last 24 hours) at 10/31/11 1837 Last data filed at 10/31/11 1800  Gross per 24 hour  Intake 6848.38 ml  Output   6055 ml  Net 793.38 ml    A little tachycardic- will increase lopressor  BIPAP PRN

## 2011-10-31 NOTE — Progress Notes (Signed)
Per protocol, removed 3cc of air/15 minutes beginning at 0000 until air was completely removed from Radial Band.  Band removed at 0100.  Site unremarkable.  Will continue to monitor.

## 2011-10-31 NOTE — Progress Notes (Signed)
RT called to assess pt for drop in O2 sats. Pt currently on 50% VM and sats maintain around 90%. RT encouraged pt to do IS, which he did satisfactorily ( x 6). Pt's sats came up to around 93% but when pt is not stimulated, sats drop back down to around 89-90% on 50%. BS are mostly clear bilaterally, and pt appears to be in no distress. Pt's family did say, however, he does have sleep apnea but has not had a sleep study done. RT will continue to monitor.

## 2011-10-31 NOTE — Progress Notes (Signed)
Dr. Donata Clay at bedside post op.  He advised not to wean to extubate until early morning and not to place on CPAP/PS until day shift.  He also advised to  D/c femoral aline in 4-6 hours.

## 2011-10-31 NOTE — Progress Notes (Signed)
1 Day Post-Op Procedure(s) (LRB): THORACIC ASCENDING ANEURYSM REPAIR (AAA) (N/A) Subjective:Repair Type I dissection with aortic valve repair, arch reconstruction  with separate graft to innominate artery.                      Intimal tear above R cor ostium  And another in arch between Innominate and L carotid                     Hemodynamics stable  NSR                     Moves all extrem,  Pulses intact   CXR clear  Objective: Vital signs in last 24 hours: Temp:  [95.7 F (35.4 C)-102 F (38.9 C)] 101.1 F (38.4 C) (09/25 0700) Pulse Rate:  [72-85] 80  (09/25 0833) Cardiac Rhythm:  [-] Normal sinus rhythm;Atrial paced;Bundle branch block (09/25 0400) Resp:  [0-19] 19  (09/25 0833) BP: (87-117)/(53-68) 113/61 mmHg (09/25 0833) SpO2:  [95 %-100 %] 98 % (09/25 0833) Arterial Line BP: (100-160)/(54-82) 103/56 mmHg (09/25 0700) FiO2 (%):  [40 %-80 %] 40 % (09/25 0833) Weight:  [252 lb 13.9 oz (114.7 kg)] 252 lb 13.9 oz (114.7 kg) (09/25 0500)  Hemodynamic parameters for last 24 hours: PAP: (34-43)/(22-28) 40/28 mmHg CO:  [5.2 L/min] 5.2 L/min CI:  [2.4 L/min/m2] 2.4 L/min/m2  Intake/Output from previous day: 09/24 0701 - 09/25 0700 In: 7776.6 [I.V.:4457.6; Blood:2110; NG/GT:60; IV Piggyback:1149] Out: 6030 [Urine:3130; Emesis/NG output:100; Blood:2650; Chest Tube:150] Intake/Output this shift:  wt up 10 lbs  Coarse breath sounds  Lab Results:  Basename 10/31/11 0415 10/30/11 2325  WBC 14.1* 20.5*  HGB 10.9* 11.5*  HCT 31.4* 32.6*  PLT 152 147*   BMET:  Basename 10/31/11 0415 10/30/11 2320 10/30/11 0255  NA 140 141 --  K 4.4 3.0* --  CL 108 -- 100  CO2 27 -- 28  GLUCOSE 146* 162* --  BUN 12 -- 13  CREATININE 1.07 -- 1.12  CALCIUM 7.7* -- 9.7    PT/INR:  Basename 10/30/11 2325  LABPROT 17.4*  INR 1.47   ABG    Component Value Date/Time   PHART 7.365 10/31/2011 0420   HCO3 25.8* 10/31/2011 0420   TCO2 27 10/31/2011 0420   ACIDBASEDEF 4.0* 10/30/2011 2107   O2SAT 96.0 10/31/2011 0420   CBG (last 3)   Basename 10/31/11 0627 10/31/11 0419 10/31/11 0325  GLUCAP 143* 133* 143*    Assessment/Plan: S/P Procedure(s) (LRB): THORACIC ASCENDING ANEURYSM REPAIR (AAA) (N/A) See progression orders   LOS: 2 days    Gabriel Tran,Gabriel Tran 10/31/2011

## 2011-10-31 NOTE — Progress Notes (Signed)
D/Cd left femoral a-line.  Catheter intact.  Pt tolerated well.  Pressure held for 15 minutes.  Site unremarkable with exception to small bruising noted.  Pressure dressing placed.  Will continue to monitor site.

## 2011-10-31 NOTE — Care Management Note (Signed)
    Page 1 of 2   11/16/2011     4:14:53 PM   CARE MANAGEMENT NOTE 11/16/2011  Patient:  Gabriel Tran, Gabriel Tran   Account Number:  [id number]  Date Initiated:  10/31/2011  Documentation initiated by:  Jonerik Sliker  Subjective/Objective Assessment:   PT S/P THORACIC ANEURYSM REPAIR ON 10/30/11.  PTA, PT INDEPENDENT, LIVES WITH SPOUSE.     Action/Plan:   MET WITH PT AND WIFE TO DISCUSS DC PLANS.  WIFE TO PROVIDE 24HR CARE AT DISCHARGE.  WILL FOLLOW FOR HOME NEEDS AS PT PROGRESSES.   Anticipated DC Date:  11/16/2011   Anticipated DC Plan:  IP REHAB FACILITY      DC Planning Services  CM consult      Choice offered to / List presented to:             Status of service:  Completed, signed off Medicare Important Message given?   (If response is "NO", the following Medicare IM given date fields will be blank) Date Medicare IM given:   Date Additional Medicare IM given:    Discharge Disposition:  IP REHAB FACILITY  Per UR Regulation:  Reviewed for med. necessity/level of care/duration of stay  If discussed at Long Length of Stay Meetings, dates discussed:   11/06/2011  11/15/2011  11/13/2011    Comments:  11/16/11 Robt Okuda,RN,BSN [phone] PT FOR DISCHARGE TO IP REHAB AFTER HD AND AFTER EP WIRES DC'D.  11-14-11 2:40pm Avie Arenas, RNBSN tolerating HD - cath placed.  Plan for CIR - have beds. Aware of dialyisis situation - started insurance approval process.  11-12-11 8:50am Avie Arenas, RNBSN [phone] Remains on cardizem and amio - now extubated.  11/06/11 Shuan Statzer,RN,BSN [phone] PT S/P PEA ARREST AND EMERGENCY STERNOTOMY ON 11/03/11 FOR CARDIAC TAMPONADE.   TO OR TODAY FOR CHEST CLOSURE, REMOVAL OF WOUND VAC.  PT REMAINS INTUBATED CURRENTLY.  WILL FOLLOW PROGRESS. WILL NEED P.T. AND O.T CONSULTS WHEN MEDICALLY ABLE TO TOLERATE THERAPY.

## 2011-10-31 NOTE — Progress Notes (Signed)
MD Donata Clay made aware of BS 191.  Lantus increased to BID.  Will continue to monitor.

## 2011-10-31 NOTE — Progress Notes (Signed)
SUBJECTIVE: Intubated. Awake.   BP 113/64  Pulse 80  Temp 101.1 F (38.4 C) (Oral)  Resp 17  Ht 5\' 8"  (1.727 m)  Wt 252 lb 13.9 oz (114.7 kg)  BMI 38.45 kg/m2  SpO2 99%  Intake/Output Summary (Last 24 hours) at 10/31/11 0825 Last data filed at 10/31/11 0700  Gross per 24 hour  Intake 7776.55 ml  Output   6030 ml  Net 1746.55 ml    PHYSICAL EXAM General: Intubated, awake Neck: No masses noted.  Lungs: Coarse mechanical breath sounds bilaterally.   Heart: RRR with no murmurs noted. Abdomen: Bowel sounds are present. Soft, non-tender.  Extremities: Trace bilateral lower extremity edema.   LABS: Basic Metabolic Panel:  Basename 10/31/11 0415 10/30/11 2320 10/30/11 0255  NA 140 141 --  K 4.4 3.0* --  CL 108 -- 100  CO2 27 -- 28  GLUCOSE 146* 162* --  BUN 12 -- 13  CREATININE 1.07 -- 1.12  CALCIUM 7.7* -- 9.7  MG 3.1* -- --  PHOS -- -- --   CBC:  Basename 10/31/11 0415 10/30/11 2325 10/29/11 1601  WBC 14.1* 20.5* --  NEUTROABS -- -- 10.7*  HGB 10.9* 11.5* --  HCT 31.4* 32.6* --  MCV 88.2 87.9 --  PLT 152 147* --   Cardiac Enzymes:  Basename 10/30/11 0255 10/29/11 2117 10/29/11 1601  CKTOTAL -- -- --  CKMB -- -- --  CKMBINDEX -- -- --  TROPONINI <0.30 <0.30 <0.30   Fasting Lipid Panel:  Basename 10/30/11 0255  CHOL 191  HDL 53  LDLCALC 117*  TRIG 106  CHOLHDL 3.6  LDLDIRECT --    Current Meds:    . acetaminophen (TYLENOL) oral liquid 160 mg/5 mL  650 mg Per Tube NOW   Or  . acetaminophen  650 mg Rectal NOW  . acetaminophen  1,000 mg Oral Q6H   Or  . acetaminophen (TYLENOL) oral liquid 160 mg/5 mL  975 mg Per Tube Q6H  . aminocaproic acid (AMICAR) for OHS   Intravenous To OR  . amLODipine  5 mg Oral Daily  . aspirin      . aspirin  324 mg Oral Once  . aspirin EC  325 mg Oral Daily   Or  . aspirin  324 mg Per Tube Daily  . bisacodyl  10 mg Oral Daily   Or  . bisacodyl  10 mg Rectal Daily  . cefUROXime (ZINACEF)  IV  1.5 g  Intravenous To OR  . cefUROXime (ZINACEF)  IV  1.5 g Intravenous Q12H  . dexmedetomidine  0.1-0.7 mcg/kg/hr Intravenous To OR  . docusate sodium  200 mg Oral Daily  . famotidine (PEPCID) IV  20 mg Intravenous Q12H  . fentaNYL      . fentaNYL      . heparin      . heparin      . insulin (NOVOLIN-R) infusion   Intravenous To OR  . insulin regular  0-10 Units Intravenous TID WC  . lidocaine      . lisinopril  10 mg Oral Daily  . magnesium sulfate  4 g Intravenous Once  . metoprolol      . metoprolol tartrate  12.5 mg Oral BID   Or  . metoprolol tartrate  12.5 mg Per Tube BID  . midazolam      . morphine      . nitroGLYCERIN      . nitroGLYCERIN  2-200 mcg/min Intravenous To OR  .  pantoprazole  40 mg Oral Q1200  . potassium chloride  10 mEq Intravenous Q1 Hr x 3  . potassium chloride  10 mEq Intravenous Once  . sodium chloride  3 mL Intravenous Q12H  . vancomycin  1,250 mg Intravenous To OR  . vancomycin  1,000 mg Intravenous Q12H  . verapamil      . DISCONTD: acetaminophen (TYLENOL) oral liquid 160 mg/5 mL  975 mg Per Tube Q6H  . DISCONTD: acetaminophen  1,000 mg Oral Q6H  . DISCONTD: aminocaproic acid (AMICAR) for OHS   Intravenous To OR  . DISCONTD: aminocaproic acid (AMICAR) for OHS   Intravenous To OR  . DISCONTD: aspirin EC  81 mg Oral Daily  . DISCONTD: aspirin EC  81 mg Oral Daily  . DISCONTD: bisacodyl  5 mg Oral Once  . DISCONTD: cefUROXime (ZINACEF)  IV  750 mg Intravenous To OR  . DISCONTD: chlorhexidine  60 mL Topical Once  . DISCONTD: chlorhexidine  60 mL Topical Once  . DISCONTD: dexmedetomidine  0.4-1.2 mcg/kg/hr Intravenous To OR  . DISCONTD: docusate sodium  100 mg Oral BID  . DISCONTD: DOPamine  2-20 mcg/kg/min Intravenous To OR  . DISCONTD: enoxaparin (LOVENOX) injection  40 mg Subcutaneous Q24H  . DISCONTD: epinephrine  0.5-20 mcg/min Intravenous To OR  . DISCONTD: furosemide  10 mg Intravenous Once  . DISCONTD: heparin-papaverine-plasmalyte irrigation    Irrigation To OR  . DISCONTD: magnesium sulfate  40 mEq Other To OR  . DISCONTD: metoprolol tartrate  12.5 mg Oral Once  . DISCONTD: metoprolol tartrate  25 mg Oral BID  . DISCONTD: phenylephrine (NEO-SYNEPHRINE) Adult infusion  30-200 mcg/min Intravenous To OR  . DISCONTD: potassium chloride  80 mEq Other To OR  . DISCONTD: sodium chloride  3 mL Intravenous Q12H  . DISCONTD: vancomycin  1,000 mg Intravenous Once  . DISCONTD: vancomycin  1,000 mg Other To OR     ASSESSMENT AND PLAN:  1. Type A aortic dissection: POD#1 s/p emergent valve sparing graft repair of aortic root with graft to inominate artery. Hemodynamically stable. Atrial paced. Still intubated.    MCALHANY,CHRISTOPHER  9/25/20138:25 AM

## 2011-10-31 NOTE — Progress Notes (Signed)
1 Day Post-Op Procedure(s) (LRB): THORACIC ASCENDING ANEURYSM REPAIR (AAA) (N/A) Subjective: Dissection repair,arch reconstruction Low O2 sats, LLL atelect NSR Neuro intact Elev CBGs w/ lantus BID  Objective: Vital signs in last 24 hours: Temp:  [95.7 F (35.4 C)-102 F (38.9 C)] 101.1 F (38.4 C) (09/25 0700) Pulse Rate:  [72-85] 80  (09/25 0833) Cardiac Rhythm:  [-] Normal sinus rhythm;Atrial paced;Bundle branch block (09/25 0400) Resp:  [0-19] 19  (09/25 0833) BP: (87-117)/(53-68) 113/61 mmHg (09/25 0833) SpO2:  [95 %-100 %] 98 % (09/25 0833) Arterial Line BP: (100-160)/(54-82) 103/56 mmHg (09/25 0700) FiO2 (%):  [40 %-80 %] 40 % (09/25 0833) Weight:  [252 lb 13.9 oz (114.7 kg)] 252 lb 13.9 oz (114.7 kg) (09/25 0500)  Hemodynamic parameters for last 24 hours: PAP: (34-43)/(22-28) 40/28 mmHg CO:  [5.2 L/min] 5.2 L/min CI:  [2.4 L/min/m2] 2.4 L/min/m2  Intake/Output from previous day: 09/24 0701 - 09/25 0700 In: 7776.6 [I.V.:4457.6; Blood:2110; NG/GT:60; IV Piggyback:1149] Out: 6030 [Urine:3130; Emesis/NG output:100; Blood:2650; Chest Tube:150] Intake/Output this shift:    EXAM Good pulses Dec bs L base  Lab Results:  Basename 10/31/11 0415 10/30/11 2325  WBC 14.1* 20.5*  HGB 10.9* 11.5*  HCT 31.4* 32.6*  PLT 152 147*   BMET:  Basename 10/31/11 0415 10/30/11 2320 10/30/11 0255  NA 140 141 --  K 4.4 3.0* --  CL 108 -- 100  CO2 27 -- 28  GLUCOSE 146* 162* --  BUN 12 -- 13  CREATININE 1.07 -- 1.12  CALCIUM 7.7* -- 9.7    PT/INR:  Basename 10/30/11 2325  LABPROT 17.4*  INR 1.47   ABG    Component Value Date/Time   PHART 7.365 10/31/2011 0420   HCO3 25.8* 10/31/2011 0420   TCO2 27 10/31/2011 0420   ACIDBASEDEF 4.0* 10/30/2011 2107   O2SAT 96.0 10/31/2011 0420   CBG (last 3)   Basename 10/31/11 0627 10/31/11 0419 10/31/11 0325  GLUCAP 143* 133* 143*    Assessment/Plan: S/P Procedure(s) (LRB): THORACIC ASCENDING ANEURYSM REPAIR (AAA) (N/A) Elev  WBC Cont lasix drip, leave foley Adv diet   LOS: 2 days    VAN TRIGT III,PETER 10/31/2011

## 2011-10-31 NOTE — Procedures (Signed)
Extubation Procedure Note  Patient Details:   Name: Gabriel Tran DOB: October 15, 1955 MRN: [id number]   Airway Documentation:     Evaluation  O2 sats: stable throughout Complications: No apparent complications Patient did tolerate procedure well. Bilateral Breath Sounds: Clear   Yes  Pt tolerated rapid wean protocol very well. Pt achieved 1.1L VC, -20 NIF and was positive for cuff leak. Pt extubated to 5lpm [place] and was oriented to name and place. Pt demonstrated Incentive Spirometer x3 achieving x2 and x1. Pt resting comfortably at this time, and vitals are within normal limits. RT will continue to monitor.   Parke Poisson 10/31/2011, 9:48 AM

## 2011-11-01 ENCOUNTER — Inpatient Hospital Stay (HOSPITAL_COMMUNITY): Payer: PRIVATE HEALTH INSURANCE

## 2011-11-01 LAB — CBC
HCT: 28.4 % — ABNORMAL LOW (ref 39.0–52.0)
Hemoglobin: 9.7 g/dL — ABNORMAL LOW (ref 13.0–17.0)
MCH: 31 pg (ref 26.0–34.0)
MCHC: 34.2 g/dL (ref 30.0–36.0)
MCV: 90.7 fL (ref 78.0–100.0)
Platelets: 129 10*3/uL — ABNORMAL LOW (ref 150–400)
RBC: 3.13 MIL/uL — ABNORMAL LOW (ref 4.22–5.81)
RDW: 13.9 % (ref 11.5–15.5)
WBC: 21.5 10*3/uL — ABNORMAL HIGH (ref 4.0–10.5)

## 2011-11-01 LAB — BASIC METABOLIC PANEL
BUN: 18 mg/dL (ref 6–23)
BUN: 22 mg/dL (ref 6–23)
CO2: 30 mEq/L (ref 19–32)
CO2: 29 mEq/L (ref 19–32)
Calcium: 7.9 mg/dL — ABNORMAL LOW (ref 8.4–10.5)
Calcium: 8 mg/dL — ABNORMAL LOW (ref 8.4–10.5)
Chloride: 105 mEq/L (ref 96–112)
Chloride: 103 mEq/L (ref 96–112)
Creatinine, Ser: 1.29 mg/dL (ref 0.50–1.35)
Creatinine, Ser: 1.37 mg/dL — ABNORMAL HIGH (ref 0.50–1.35)
GFR calc Af Amer: 71 mL/min — ABNORMAL LOW (ref >90)
GFR calc Af Amer: 66 mL/min — ABNORMAL LOW (ref >90)
GFR calc non Af Amer: 61 mL/min — ABNORMAL LOW (ref >90)
GFR calc non Af Amer: 57 mL/min — ABNORMAL LOW (ref >90)
Glucose, Bld: 152 mg/dL — ABNORMAL HIGH (ref 70–99)
Glucose, Bld: 153 mg/dL — ABNORMAL HIGH (ref 70–99)
Potassium: 4.1 mEq/L (ref 3.5–5.1)
Potassium: 4 mEq/L (ref 3.5–5.1)
Sodium: 142 mEq/L (ref 135–145)
Sodium: 140 mEq/L (ref 135–145)

## 2011-11-01 LAB — POCT I-STAT 3, ART BLOOD GAS (G3+)
O2 Saturation: 91 %
TCO2: 27 mmol/L (ref 0–100)
pCO2 arterial: 42.6 mmHg (ref 35.0–45.0)
pO2, Arterial: 64 mmHg — ABNORMAL LOW (ref 80.0–100.0)

## 2011-11-01 LAB — GLUCOSE, CAPILLARY
Glucose-Capillary: 162 mg/dL — ABNORMAL HIGH (ref 70–99)
Glucose-Capillary: 112 mg/dL — ABNORMAL HIGH (ref 70–99)
Glucose-Capillary: 138 mg/dL — ABNORMAL HIGH (ref 70–99)

## 2011-11-01 MED ORDER — TRAMADOL HCL 50 MG PO TABS
50.0000 mg | ORAL_TABLET | Freq: Four times a day (QID) | ORAL | Status: DC | PRN
  Administered 2011-11-01: 50 mg via ORAL
  Filled 2011-11-01: qty 1

## 2011-11-01 MED ORDER — TEMAZEPAM 15 MG PO CAPS
15.0000 mg | ORAL_CAPSULE | Freq: Every evening | ORAL | Status: DC | PRN
  Administered 2011-11-01: 15 mg via ORAL
  Filled 2011-11-01: qty 1

## 2011-11-01 MED ORDER — FUROSEMIDE 10 MG/ML IJ SOLN
10.0000 mg/h | INTRAVENOUS | Status: DC
  Administered 2011-11-01: 10 mg/h via INTRAVENOUS
  Filled 2011-11-01 (×2): qty 25

## 2011-11-01 MED ORDER — POTASSIUM CHLORIDE CRYS ER 20 MEQ PO TBCR
20.0000 meq | EXTENDED_RELEASE_TABLET | Freq: Two times a day (BID) | ORAL | Status: DC
  Administered 2011-11-01 – 2011-11-02 (×3): 20 meq via ORAL
  Filled 2011-11-01 (×6): qty 1

## 2011-11-01 MED ORDER — FUROSEMIDE 10 MG/ML IJ SOLN
40.0000 mg | Freq: Once | INTRAMUSCULAR | Status: AC
  Administered 2011-11-01: 40 mg via INTRAVENOUS

## 2011-11-01 MED FILL — Heparin Sodium (Porcine) Inj 1000 Unit/ML: INTRAMUSCULAR | Qty: 20 | Status: AC

## 2011-11-01 MED FILL — Lidocaine HCl IV Inj 20 MG/ML: INTRAVENOUS | Qty: 5 | Status: AC

## 2011-11-01 MED FILL — Heparin Sodium (Porcine) Inj 1000 Unit/ML: INTRAMUSCULAR | Qty: 30 | Status: AC

## 2011-11-01 MED FILL — Mannitol IV Soln 20%: INTRAVENOUS | Qty: 500 | Status: AC

## 2011-11-01 MED FILL — Electrolyte-R (PH 7.4) Solution: INTRAVENOUS | Qty: 6000 | Status: AC

## 2011-11-01 MED FILL — Sodium Chloride IV Soln 0.9%: INTRAVENOUS | Qty: 2000 | Status: AC

## 2011-11-01 MED FILL — Sodium Chloride Irrigation Soln 0.9%: Qty: 3000 | Status: AC

## 2011-11-01 MED FILL — Sodium Bicarbonate IV Soln 8.4%: INTRAVENOUS | Qty: 100 | Status: AC

## 2011-11-01 NOTE — Progress Notes (Signed)
Patient ID: Gabriel Tran, male   DOB: 1955-10-21, 56 y.o.   MRN: [id number]   SICU Evening Rounds:  Hemodynamically stable  Urine output adequate on lasix drip. Creat up slightly. BMET    Component Value Date/Time   NA 140 11/01/2011 1630   K 4.0 11/01/2011 1630   CL 103 11/01/2011 1630   CO2 29 11/01/2011 1630   GLUCOSE 153* 11/01/2011 1630   BUN 22 11/01/2011 1630   CREATININE 1.37* 11/01/2011 1630   CALCIUM 8.0* 11/01/2011 1630   GFRNONAA 57* 11/01/2011 1630   GFRAA 66* 11/01/2011 1630

## 2011-11-01 NOTE — Evaluation (Signed)
Physical Therapy Evaluation Patient Details Name: Gabriel Tran MRN: [id number] DOB: 18-Jan-1956 Today's Date: 11/01/2011 Time: 4098-1191 PT Time Calculation (min): 21 min  PT Assessment / Plan / Recommendation Clinical Impression  Pt adm for emergent repair of dissection of thoracic aortic artery.  Pt with good mobility.  Will require only brief course of PT to become independent and compliant with sternal precautions.    PT Assessment  Patient needs continued PT services    Follow Up Recommendations  No PT follow up    Barriers to Discharge        Equipment Recommendations  None recommended by PT    Recommendations for Other Services     Frequency Min 3X/week    Precautions / Restrictions Precautions Precautions: Fall   Pertinent Vitals/Pain SaO2 91% on 6L of O2      Mobility  Bed Mobility Bed Mobility: Supine to Sit;Sitting - Scoot to Edge of Bed Supine to Sit: 3: Mod assist;HOB elevated Sitting - Scoot to Edge of Bed: 4: Min assist Transfers Transfers: Sit to Stand;Stand to Sit Sit to Stand: 4: Min guard;Without upper extremity assist;From bed Stand to Sit: 4: Min guard;Without upper extremity assist;To chair/3-in-1 Details for Transfer Assistance: verbal cues for sternal precautions. Ambulation/Gait Ambulation/Gait Assistance: 4: Min guard Ambulation Distance (Feet): 200 Feet Assistive device: Other (Comment) (pushing w/c) Gait Pattern: Step-through pattern;Decreased stride length    Shoulder Instructions     Exercises     PT Diagnosis: Difficulty walking  PT Problem List: Decreased activity tolerance;Decreased mobility;Decreased knowledge of precautions PT Treatment Interventions: Gait training;Stair training;Functional mobility training;Patient/family education;Therapeutic activities   PT Goals Acute Rehab PT Goals PT Goal Formulation: With patient Time For Goal Achievement: 11/08/11 Potential to Achieve Goals: Good Pt will go Supine/Side to Sit:  with modified independence PT Goal: Supine/Side to Sit - Progress: Goal set today Pt will go Sit to Supine/Side: with modified independence PT Goal: Sit to Supine/Side - Progress: Goal set today Pt will go Sit to Stand: with modified independence PT Goal: Sit to Stand - Progress: Goal set today Pt will go Stand to Sit: with modified independence PT Goal: Stand to Sit - Progress: Goal set today Pt will Ambulate: >150 feet;with modified independence PT Goal: Ambulate - Progress: Goal set today Pt will Go Up / Down Stairs: 3-5 stairs;with supervision PT Goal: Up/Down Stairs - Progress: Goal set today  Visit Information  Last PT Received On: 11/01/11 Assistance Needed: +1 Reason Eval/Treat Not Completed: Patient at procedure or test/unavailable    Subjective Data  Subjective: Pt states he is doing okay. Patient Stated Goal: Return home   Prior Functioning  Home Living Lives With: Spouse Available Help at Discharge: Family Type of Home: House Home Access: Stairs to enter Secretary/administrator of Steps: 4 Entrance Stairs-Rails: Right Home Layout: One level Home Adaptive Equipment: None Prior Function Level of Independence: Independent Able to Take Stairs?: Reciprically Driving: Yes Vocation: Full time employment Communication Communication: No difficulties    Cognition  Overall Cognitive Status: Appears within functional limits for tasks assessed/performed Arousal/Alertness: Awake/alert Orientation Level: Appears intact for tasks assessed Behavior During Session: [facility] for tasks performed    Extremity/Trunk Assessment Right Lower Extremity Assessment RLE ROM/Strength/Tone: [facility] for tasks assessed Left Lower Extremity Assessment LLE ROM/Strength/Tone: [facility] for tasks assessed   Balance Static Standing Balance Static Standing - Balance Support: No upper extremity supported Static Standing - Level of Assistance: 6: Modified independent (Device/Increase time)  End of Session PT  - End  of Session Equipment Utilized During Treatment: Oxygen Activity Tolerance: Patient tolerated treatment well Patient left: in chair;with call bell/phone within reach;with family/visitor present  GP     [facility] 11/01/2011, 12:29 PM  [facility] PT [phone]

## 2011-11-01 NOTE — Progress Notes (Signed)
Inpatient Diabetes Program Recommendations  AACE/ADA: New Consensus Statement on Inpatient Glycemic Control (2013)  Target Ranges:  Prepandial:   less than 140 mg/dL      Peak postprandial:   less than 180 mg/dL (1-2 hours)      Critically ill patients:  140 - 180 mg/dL   Reason for Visit: Results for ADEM, GAMM (MRN [id number]) as of 11/01/2011 14:06  Ref. Range 10/31/2011 19:29 10/31/2011 23:57 11/01/2011 04:31 11/01/2011 07:34 11/01/2011 11:54  Glucose-Capillary Latest Range: 70-99 mg/dL 782 (H) 956 (H) 213 (H) 162 (H) 112 (H)   Note A1C=5.9%.  No history of diabetes.  Will likely not need Lantus.  May consider discontinuation of basal insulin (Lantus).  CBG down to 112 mg/dL.  Will follow.

## 2011-11-01 NOTE — Progress Notes (Signed)
PT has been refusing CPAP. PT currently on venti-mask with o2 sats 95%, resting comfortably. RT will continue to monitor.

## 2011-11-01 NOTE — Progress Notes (Signed)
SUBJECTIVE: No complaints this am.   BP 126/64  Pulse 91  Temp 97.6 F (36.4 C) (Oral)  Resp 24  Ht 5\' 8"  (1.727 m)  Wt 241 lb 10 oz (109.6 kg)  BMI 36.74 kg/m2  SpO2 94%  Intake/Output Summary (Last 24 hours) at 11/01/11 0734 Last data filed at 11/01/11 0700  Gross per 24 hour  Intake 2125.13 ml  Output   3005 ml  Net -879.87 ml    PHYSICAL EXAM General: Well developed, well nourished, in no acute distress. Alert and oriented x 3.  Psych:  Good affect, responds appropriately Neck: No JVD. No masses noted.  Lungs: Clear bilaterally with no wheezes or rhonci noted.  Heart: RRR with no murmurs noted. Abdomen: Bowel sounds are present. Soft, non-tender.  Extremities: No lower extremity edema.   LABS: Basic Metabolic Panel:  Basename 11/01/11 0414 10/31/11 1716 10/31/11 1700 10/31/11 0415  NA 142 146* -- --  K 4.1 3.6 -- --  CL 105 107 -- --  CO2 30 -- -- 27  GLUCOSE 152* 185* -- --  BUN 18 14 -- --  CREATININE 1.29 1.20 -- --  CALCIUM 7.9* -- -- 7.7*  MG -- -- 2.6* 3.1*  PHOS -- -- -- --   CBC:  Basename 11/01/11 0414 10/31/11 1716 10/31/11 1700 10/29/11 1601  WBC 21.5* -- 19.0* --  NEUTROABS -- -- -- 10.7*  HGB 9.7* 9.9* -- --  HCT 28.4* 29.0* -- --  MCV 90.7 -- 90.1 --  PLT 129* -- 124* --   Cardiac Enzymes:  Basename 10/30/11 0255 10/29/11 2117 10/29/11 1601  CKTOTAL -- -- --  CKMB -- -- --  CKMBINDEX -- -- --  TROPONINI <0.30 <0.30 <0.30   Fasting Lipid Panel:  Basename 10/30/11 0255  CHOL 191  HDL 53  LDLCALC 117*  TRIG 106  CHOLHDL 3.6  LDLDIRECT --    Current Meds:    . acetaminophen  1,000 mg Oral Q6H   Or  . acetaminophen (TYLENOL) oral liquid 160 mg/5 mL  975 mg Per Tube Q6H  . amLODipine  5 mg Oral Daily  . aspirin EC  325 mg Oral Daily  . bisacodyl  10 mg Oral Daily   Or  . bisacodyl  10 mg Rectal Daily  . cefUROXime (ZINACEF)  IV  1.5 g Intravenous Q12H  . docusate sodium  200 mg Oral Daily  . famotidine (PEPCID) IV   20 mg Intravenous Q12H  . furosemide (LASIX) infusion  8 mg/hr Intravenous Q24H  . furosemide  20 mg Intravenous Once  . insulin aspart  0-24 Units Subcutaneous Q4H  . insulin glargine  18 Units Subcutaneous BID  . lisinopril  10 mg Oral Daily  . metoprolol tartrate  25 mg Oral BID   Or  . metoprolol tartrate  25 mg Per Tube BID  . pantoprazole  40 mg Oral Q1200  . potassium chloride  10 mEq Intravenous Q1 Hr x 3  . sodium chloride  3 mL Intravenous Q12H  . vancomycin  1,000 mg Intravenous Q12H  . DISCONTD: aspirin  324 mg Per Tube Daily  . DISCONTD: insulin glargine  14 Units Subcutaneous BH-q7a  . DISCONTD: insulin regular  0-10 Units Intravenous TID WC  . DISCONTD: metoprolol tartrate  12.5 mg Per Tube BID  . DISCONTD: metoprolol tartrate  12.5 mg Oral BID     ASSESSMENT AND PLAN:  1. Type A aortic dissection: POD #2 s/p emergent valve sparing graft  repair of aortic root with graft to inominate artery. He is now extubated. Hemodynamically stable with Dopamine gtt. Rhythm stable. Will need echo before discharge but no need to do that today.     MCALHANY,CHRISTOPHER  9/26/20137:34 AM

## 2011-11-01 NOTE — Op Note (Signed)
Gabriel Tran, Gabriel Tran NO.:  [id number]  MEDICAL RECORD NO.:  [id number]  LOCATION:  2311                         FACILITY:  [facility]  PHYSICIAN:  Kerin Perna, M.D.  DATE OF BIRTH:  1955/07/25  DATE OF PROCEDURE:  10/30/2011 DATE OF DISCHARGE:                              OPERATIVE REPORT   OPERATION: 1. Emergency repair of a type A ascending thoracic aortic dissection     with replacement of the ascending aorta with a 30 mm Hemashield     graft and reconstruction of the aortic arch with a hemiarch repair     to the left subclavian and left carotid and a separate 10 mm side     graft to the innominate artery. 2. Resuspension-repair of the aortic valve for aortic insufficiency. 3. Right axillary cannulation for antegrade cerebral perfusion during     hypothermic circulatory arrest. 4. Placement of left femoral A-line for blood pressure monitoring.  PREOPERATIVE DIAGNOSIS:  Acute type A ascending dissection with moderate aortic insufficiency.  POSTOPERATIVE DIAGNOSIS:  Acute type A ascending dissection with moderate aortic insufficiency.  SURGEON:  Kerin Perna, MD  ASSISTANT:  Sheliah Plane, MD and Doree Fudge, PA-C  ANESTHESIA:  General by Dr. Sheldon Silvan.  INDICATIONS:  The patient is a 56 year old obese male ex-smoker with hypertension, who presented to the emergency department with upper chest pain extending up into his head at which time his blood pressure was 220 systolic.  Cardiac enzymes were negative and he was admitted for observation after a head CT scan showed no evidence of stroke.  He had persistent chest pain.  Cardiac catheterization was performed showing normal left main ostium, normal left-sided coronary vessels.  The right coronary was not engaged and a root shot showed a intimal flap consistent with a type A dissection.  This was confirmed by emergency CT scan showing the dissection to arise just above the sinotubular  junction and extending into the iliac vessels.  The patient was then felt to be candidate for emergency surgical repair of his type A dissection.  Prior to surgery, I examined the patient in the cardiac cath lab at the end of his cath and discussed the situation with the patient and his family.  I discussed the indications and expected benefits of emergency repair of type A thoracic dissection.  I discussed the major aspects of surgery including location of the surgical incisions, the use of general anesthesia, and cardiopulmonary bypass, the expected use of hypothermic circulatory arrest, and the expected postoperative recovery.  I reviewed the risks of this operation including risks of death, stroke, MI, bleeding, blood transfusion requirement, and renal failure.  He understood and agreed to proceed with surgery under what I felt was an informed consent.  OPERATIVE FINDINGS: 1. Large intimal tear in the aortic root just above the ostium of the     right coronary artery, which caused detachment of the aortic valve. 2. Separate intimal tear in the aortic arch between the innominate     artery and the left carotid. 3. Significant left ventricular hypertrophy from hypertension. 4. Successful reconstruction of the ascending aorta and arch with a 30     mm straight  graft from the sinotubular junction to the distal arch     with the innominate artery reconstructed with a interposition graft     of 10 mm Hemashield to the side of the 30 mm graft. 5. Successful repair of aortic insufficiency by resuspending the     aortic valve.  DESCRIPTION OF THE OPERATIVE PROCEDURE:  The patient was brought directly to the operating room from the cardiac cath lab for emergency repair of type A dissection.  General anesthesia was induced and a transesophageal echo probe was placed by the anesthesiologist.  Theone Murdoch catheter and radial arterial catheters were placed for monitoring. The patient remained  hemodynamically stable.  The echo showed significant aortic insufficiency.  The patient was prepped and draped as a sterile field.  A proper time- out was performed.  A sternal incision was made and the sternum was retracted.  The pericardium was opened and there was some bloody fluid. The ascending aorta had a obvious hematoma and was enlarged 5 cm.  Attention was then directed to the right axillary artery cannulation. An incision was made in the deltopectoral groove.  The patient's exposure was difficult due to his obese body habitus.  An incision was made through the pectoralis major and pectoralis minor and the axillary artery was identified and encircled with vessel loop.  Heparin was administered and the vessel was clamped.  An arteriotomy was made and an 8 mm graft was sewn end-to-side using running 5-0 Prolene and the clamps were removed.  The arterial inflow line from cardiopulmonary bypass circuit was then connected to 8 mm Hemashield graft and secured and de- aired.  Attention was then directed to the sternotomy.  A pursestring was placed in the right atrium and heparin was fully administered and the ACT was documented as being therapeutic.  The patient was then placed on cardiopulmonary bypass.  A left ventricular vent was placed via the right superior pulmonary vein.  The patient was then cooled to 18 degrees core temperature.  A retrograde coronary sinus catheter was placed.  The aorta was carefully dissected from the pulmonary artery.  A crossclamp was then placed beneath the innominate artery and cardioplegia was delivered via the retrograde catheter to produce hypothermic cardioplegic arrest of the heart as the rest of the body was being cooled.  Septal temperature dropped less than 12 degrees.  The aorta was transected beneath the clamp.  There was an obvious false lumen.  Exam of the aorta back to the root showed the entry point to be just above the right coronary  ostium.  The left coronary was not affected.  The aorta was trimmed at the sinotubular junction just above the coronaries.  The false lumen was obliterated by using biologic glue to glue the intima to the adventitia with a piece of Teflon felt on the right lateral aspect of the tear to provide structural support.  After the glue was set, the valve was carefully inspected and found to be without leaflet abnormalities and was felt the insufficiency was due to unhinging of the valve support apparatus from the dissection.  The valve was resuspended to the aortic root using 3 mattress sutures of 4-0 Prolene with pledgets all tied on the outside of the aorta to resuspend the valve.  The valve was tested with cold saline and appeared to be perfectly competent.  Next the size of the aorta was sized to a 30 mm graft.  The 30 mm Hemashield graft was selected and then sewn  end-to-end to the aorta at the sinotubular junction.  This was performed with a running 4-0 Prolene, which was reinforced with interrupted 4-0 Prolene pledgeted sutures on the inside of the back wall anastomosis.  The anastomosis was then finished around the anterior aspect and tied.  The anterior anastomosis was reinforced with interrupted 4-0 Prolene pledgeted sutures placed on the outside of the anastomosis and all tied.  Exam showed widely patent coronary vessels inside the graft.  It should be noted there was enough aorta above the right coronary ostium where the dissection was started to allow secure anastomosis of the graft.  At this point, the core temperature is less than 18 degrees and the patient was prepared for circulatory arrest by the anesthesiologist and the perfusionist.  Blood was drained from the pump.  The patient was placed in a steep Trendelenburg position and the crossclamp was then removed.  Careful dissection of the innominate artery off the arch allowed placement of a clamp at the origin of innominate  from the aorta and then antegrade cerebral perfusion through the axillary cannula was then started at 500 mL/minute.  Pulse oximeter cerebral oximetry was monitored during the operation showed stable oxygenation and perfusion of both brain hemispheres.  The torn aorta was dissected back to the innominate artery and then the arch was carefully examined.  It was apparent there was a second entry intimal tear between the innominate artery and the left carotid.  The arch was then further resected back just proximal to the left carotid artery and the innominate artery was detached.  The 30 mm graft was then divided and beveled appropriately for placement to the distal arch. This anastomosis was performed with a running 4-0 Prolene.  The posterior suture line was reinforced with interrupted 4-0 interrupted pledgeted sutures on the inside of the graft.  The anastomosis was then completed anteriorly and this was again reinforced with several interrupted 4-0 pledgeted Prolene on the outside of the anastomosis.  At this point, a pursestring was placed in the graft and a 19-French femoral cannula was placed in the graft to provide antegrade perfusion and the inflow circuit from the pump was detached from the axillary artery, cannula to the aorta, and a vent was placed in the graft as well and perfusion of the body was re-established.  There was excellent backflow bleeding to the innominate artery and antegrade cerebral perfusion was used initially to the axillary artery in a separate circuit but then discontinued.  Cerebral oximetry showed adequate perfusion to both hemispheres.  Next the innominate artery needed to be reconnected to the main graft using an interposition graft of 10 mm Hemashield.  This was sewn end-to- end to the proximal innominate artery using running 5-0 Prolene.  The graft was then cut to the appropriate length and beveled and then sewn end-to-side to the 30 mm graft using a  partial occluding clamp on the graft and running 5-0 Prolene.  The partial occluding clamp was removed and then blood was re-established through the innominate artery antegrade.  At this point, the suture lines were checked and there were some additional interrupted sutures placed at the proximal and distal graft anastomoses.  Overall bleeding seem to be appropriate.  The patient was then rewarmed and this took 2 hours due to the patient's large BMI.  We placed temporary pacing wires and started ventilating the lungs while the patient was being rewarmed.  When the patient was adequately rewarmed, low-dose dopamine was started and the patient  was then weaned off cardiopulmonary bypass in a sinus rhythm.  The echo showed no aortic insufficiency with good LV and RV function.  Protamine was administered. There was still some bleeding which required an additional pledgeted suture on the anastomosis of the graft to the distal arch.  Platelet count was 70,000.  The patient was given platelets and FFP with improved coagulation function.  The mediastinum was irrigated warm irrigation. The right axillary artery incision was then irrigated.  The graft was doubly ligated and oversewn just above the anastomosis of the axillary artery and divided.  That wound was then closed in layers using interrupted Vicryl and staples for the skin.  The mediastinal bleeding was controlled.  The superior mediastinal fat was covered over the aorta.  The mediastinum was drained with an anterior and posterior mediastinal tubes, brought out through separate incisions.  The sternum was closed with wire.  The patient remained hemodynamically stable.  The pectoralis fascia was closed.  The subcutaneous and skin layers were closed in running Vicryl and sterile dressings were applied.  Total cardiopulmonary bypass time was 300 minutes with antegrade cerebral perfusion during circulatory arrest of 60 minutes.     Kerin Perna, M.D.     PV/MEDQ  D:  10/31/2011  T:  11/01/2011  Job:  [id number]

## 2011-11-01 NOTE — Progress Notes (Signed)
T[facility] DAILY PROGRESS NOTE                   [address]            [place] [address]          [phone]      2 Days Post-Op Procedure(s) (LRB): THORACIC ASCENDING ANEURYSM REPAIR (AAA) (N/A)  Total Length of Stay:  LOS: 3 days   Subjective: On Venti mask this am. He states his breathing is "comfortable". His only complaint is being hungry.  Objective: Vital signs in last 24 hours: Temp:  [97.3 F (36.3 C)-100.8 F (38.2 C)] 98.7 F (37.1 C) (09/26 0736) Pulse Rate:  [78-127] 91  (09/26 0700) Cardiac Rhythm:  [-] Bundle branch block (09/26 0600) Resp:  [12-25] 24  (09/26 0700) BP: (101-130)/(47-96) 126/64 mmHg (09/26 0700) SpO2:  [89 %-100 %] 94 % (09/26 0700) Arterial Line BP: (82-130)/(48-101) 95/80 mmHg (09/26 0700) FiO2 (%):  [40 %-50 %] 50 % (09/26 0600) Weight:  [241 lb 10 oz (109.6 kg)] 241 lb 10 oz (109.6 kg) (09/26 0500)  Filed Weights   10/29/11 2059 10/31/11 0500 11/01/11 0500  Weight: 238 lb 14.4 oz (108.364 kg) 252 lb 13.9 oz (114.7 kg) 241 lb 10 oz (109.6 kg)    Weight change: -11 lb 3.9 oz (-5.1 kg)   Hemodynamic parameters for last 24 hours: PAP: (34-36)/(23-25) 36/25 mmHg CO:  [3.9 L/min-5.2 L/min] 5.2 L/min CI:  [1.8 L/min/m2-2.4 L/min/m2] 2.4 L/min/m2  Intake/Output from previous day: 09/25 0701 - 09/26 0700 In: 2125.1 [P.O.:210; I.V.:1263.1; IV Piggyback:652] Out: 3005 [Urine:2650; Emesis/NG output:50; Chest Tube:305]     Current Meds: Scheduled Meds:   . acetaminophen  1,000 mg Oral Q6H   Or  . acetaminophen (TYLENOL) oral liquid 160 mg/5 mL  975 mg Per Tube Q6H  . amLODipine  5 mg Oral Daily  . aspirin EC  325 mg Oral Daily  . bisacodyl  10 mg Oral Daily   Or  . bisacodyl  10 mg Rectal Daily  . cefUROXime (ZINACEF)  IV  1.5 g Intravenous Q12H  . docusate sodium  200 mg Oral Daily  . famotidine (PEPCID) IV  20 mg Intravenous Q12H  . furosemide (LASIX) infusion  8 mg/hr Intravenous Q24H  . furosemide  20 mg  Intravenous Once  . insulin aspart  0-24 Units Subcutaneous Q4H  . insulin glargine  18 Units Subcutaneous BID  . lisinopril  10 mg Oral Daily  . metoprolol tartrate  25 mg Oral BID   Or  . metoprolol tartrate  25 mg Per Tube BID  . pantoprazole  40 mg Oral Q1200  . potassium chloride  10 mEq Intravenous Q1 Hr x 3  . sodium chloride  3 mL Intravenous Q12H  . vancomycin  1,000 mg Intravenous Q12H   Continuous Infusions:   . sodium chloride 20 mL/hr at 10/31/11 2152  . sodium chloride 20 mL/hr (10/30/11 2330)  . sodium chloride    . DOPamine 3 mcg/kg/min (10/31/11 2000)   PRN Meds:.acetaminophen, acetaminophen, albumin human, fentaNYL, hydrALAZINE, metoprolol, midazolam, nitroGLYCERIN, ondansetron (ZOFRAN) IV, ondansetron, oxyCODONE, sodium chloride, DISCONTD:  morphine injection, DISCONTD:  morphine injection  General appearance: alert, cooperative and no distress Neurologic: intact Heart: regular rate and rhythm Lungs: Mostly clear Abdomen: soft, non-tender; bowel sounds normal; no masses,  no organomegaly Extremities: Mild bilateral LE edema Wound: Dressings clean and dry  Lab Results: CBC: Basename 11/01/11 0414 10/31/11 1716 10/31/11 1700  WBC 21.5* -- 19.0*  HGB 9.7* 9.9* --  HCT 28.4* 29.0* --  PLT 129* -- 124*   BMET:  Basename 11/01/11 0414 10/31/11 1716 10/31/11 0415  NA 142 146* --  K 4.1 3.6 --  CL 105 107 --  CO2 30 -- 27  GLUCOSE 152* 185* --  BUN 18 14 --  CREATININE 1.29 1.20 --  CALCIUM 7.9* -- 7.7*    PT/INR:  Basename 10/30/11 2325  LABPROT 17.4*  INR 1.47   Radiology: Dg Chest Port 1 View  10/31/2011  *RADIOLOGY REPORT*  Clinical Data: 56 year old male hypoxia.  PORTABLE CHEST - 1 VIEW  Comparison: 0655 hours the same day earlier.  Findings: Portable AP view 1900 hours.  Extubated.  Enteric tube removed.  Right IJ Swan-Ganz catheter removed.  Introducer sheath remains.  Chest tube removed.  Mediastinal drain remains.  Stable lung volumes.   Stable cardiac size and mediastinal contours. No pneumothorax or pulmonary edema.  Retrocardiac atelectasis.  IMPRESSION: 1.  Extubated and other lines and tubes removed as above. 2.  No pneumothorax.  Retrocardiac atelectasis.   Original Report Authenticated By: Harley Hallmark, M.D.    Dg Chest Portable 1 View In Am  10/31/2011  *RADIOLOGY REPORT*  Clinical Data: Post thoracic aneurysm repair.  PORTABLE CHEST - 1 VIEW  Comparison: 10/30/2011  Findings: Prior median sternotomy.  Support devices are in stable position.  Areas of left perihilar and lower lobe atelectasis.  Low lung volumes.  No confluent opacity on the right.  Stable cardiomegaly.  IMPRESSION: No significant change.   Original Report Authenticated By: Cyndie Chime, M.D.      Assessment/Plan: S/P Procedure(s) (LRB): THORACIC ASCENDING ANEURYSM REPAIR (AAA) (N/A)  1.CV-Wean Dopamine.Continue Lopressor 25 bid, Norvasc 5 daily, and Lisinopril 10 daily. 2.Pulmonary-Chest tubes with 305 cc of output previous 24 hours.Monitor output this am. Possibly remove today.CXR this am shows patient rotated to the right,increasing left base opacity (likely pleural effusion and atelectasis).Encourage incentive spirometer and flutter valve. 3.Volume overload-On Lasix gttp. 4.ABL anemia - H and H 9.7 and 28.4 this am. 5.DM-CBGs 191/137/137.Pre op HGA1C 5.9.Will continue insulin. Will need further surveillance as an outpatient. 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 7.Creatinine remains 1.29 8.GI-no abdominal pain, nausea, or emesis this am.Begin clear liquids and slowly advance diet as tolerates.  Doree Fudge PA-C 11/01/2011 7:41 AM

## 2011-11-02 ENCOUNTER — Inpatient Hospital Stay (HOSPITAL_COMMUNITY): Payer: PRIVATE HEALTH INSURANCE

## 2011-11-02 DIAGNOSIS — I319 Disease of pericardium, unspecified: Secondary | ICD-10-CM

## 2011-11-02 DIAGNOSIS — I10 Essential (primary) hypertension: Secondary | ICD-10-CM

## 2011-11-02 LAB — GLUCOSE, CAPILLARY
Glucose-Capillary: 127 mg/dL — ABNORMAL HIGH (ref 70–99)
Glucose-Capillary: 104 mg/dL — ABNORMAL HIGH (ref 70–99)
Glucose-Capillary: 135 mg/dL — ABNORMAL HIGH (ref 70–99)
Glucose-Capillary: 104 mg/dL — ABNORMAL HIGH (ref 70–99)

## 2011-11-02 LAB — CBC
HCT: 24.8 % — ABNORMAL LOW (ref 39.0–52.0)
Hemoglobin: 8.2 g/dL — ABNORMAL LOW (ref 13.0–17.0)
MCH: 29.9 pg (ref 26.0–34.0)
MCHC: 33.1 g/dL (ref 30.0–36.0)
MCV: 90.5 fL (ref 78.0–100.0)
Platelets: 124 10*3/uL — ABNORMAL LOW (ref 150–400)
RBC: 2.74 MIL/uL — ABNORMAL LOW (ref 4.22–5.81)
RDW: 13.7 % (ref 11.5–15.5)
WBC: 17.3 10*3/uL — ABNORMAL HIGH (ref 4.0–10.5)

## 2011-11-02 LAB — COMPREHENSIVE METABOLIC PANEL
ALT: 147 U/L — ABNORMAL HIGH (ref 0–53)
AST: 120 U/L — ABNORMAL HIGH (ref 0–37)
Albumin: 2.7 g/dL — ABNORMAL LOW (ref 3.5–5.2)
Alkaline Phosphatase: 33 U/L — ABNORMAL LOW (ref 39–117)
BUN: 25 mg/dL — ABNORMAL HIGH (ref 6–23)
CO2: 30 mEq/L (ref 19–32)
Calcium: 8.1 mg/dL — ABNORMAL LOW (ref 8.4–10.5)
Chloride: 102 mEq/L (ref 96–112)
Creatinine, Ser: 1.3 mg/dL (ref 0.50–1.35)
GFR calc Af Amer: 70 mL/min — ABNORMAL LOW (ref >90)
GFR calc non Af Amer: 61 mL/min — ABNORMAL LOW (ref >90)
Glucose, Bld: 102 mg/dL — ABNORMAL HIGH (ref 70–99)
Potassium: 4.2 mEq/L (ref 3.5–5.1)
Sodium: 138 mEq/L (ref 135–145)
Total Bilirubin: 0.4 mg/dL (ref 0.3–1.2)
Total Protein: 5.7 g/dL — ABNORMAL LOW (ref 6.0–8.3)

## 2011-11-02 MED ORDER — DOPAMINE-DEXTROSE 3.2-5 MG/ML-% IV SOLN: 0.0000 ug/kg/min | INTRAVENOUS | Status: DC

## 2011-11-02 MED ORDER — INSULIN GLARGINE 100 UNIT/ML ~~LOC~~ SOLN: 14.0000 [IU] | Freq: Every day | SUBCUTANEOUS | Status: DC

## 2011-11-02 MED ORDER — INSULIN GLARGINE 100 UNIT/ML ~~LOC~~ SOLN: 10.0000 [IU] | Freq: Every day | SUBCUTANEOUS | Status: DC

## 2011-11-02 MED ORDER — DM-GUAIFENESIN ER 30-600 MG PO TB12
1.0000 | ORAL_TABLET | Freq: Two times a day (BID) | ORAL | Status: DC | PRN
  Filled 2011-11-02: qty 1

## 2011-11-02 MED ORDER — FUROSEMIDE 10 MG/ML IJ SOLN
40.0000 mg | Freq: Once | INTRAMUSCULAR | Status: AC
  Administered 2011-11-02: 40 mg via INTRAVENOUS

## 2011-11-02 MED FILL — Magnesium Sulfate Inj 50%: INTRAMUSCULAR | Qty: 10 | Status: AC

## 2011-11-02 MED FILL — Potassium Chloride Inj 2 mEq/ML: INTRAVENOUS | Qty: 40 | Status: AC

## 2011-11-02 NOTE — Progress Notes (Signed)
Pt's chest dressing noted to be saturated in blood.  Dressing removed.  Blood oozes from incision when patient coughs.  Incision cleansed with betadine and covered with gauze and abdominal pads.  Patient resting with vital signs WDL.  Will continue to monitor closely.  Will alert MD of increased drainage during morning rounds.    Ulis Rias, RN

## 2011-11-02 NOTE — Progress Notes (Signed)
Pt is wearing CPAP 2250. Auto-titrate minimum 6 cmH2O and maximum 20 cmH2O. Vitals are HR 81. RR 16. Oxygen saturation is 94%. BP 108/79. 15 LPM bleed in to keep oxygen saturation up. Breath sounds are diminished. Will continue to monitor.

## 2011-11-02 NOTE — Progress Notes (Signed)
  Echocardiogram 2D Echocardiogram has been performed.  Georgian Co 11/02/2011, 4:18 PM

## 2011-11-02 NOTE — Progress Notes (Signed)
    SUBJECTIVE: Required non-rebreather overnight. Feels better this am. Minimal SOB.   BP 103/48  Pulse 63  Temp 97.6 F (36.4 C) (Oral)  Resp 18  Ht 5\' 8"  (1.727 m)  Wt 246 lb 7.6 oz (111.8 kg)  BMI 37.48 kg/m2  SpO2 96%  Intake/Output Summary (Last 24 hours) at 11/02/11 1610 Last data filed at 11/02/11 0700  Gross per 24 hour  Intake 1367.4 ml  Output   1320 ml  Net   47.4 ml    PHYSICAL EXAM General: Well developed, well nourished, in no acute distress. Alert and oriented x 3.  Psych:  Good affect, responds appropriately Neck: No JVD. No masses noted.  Lungs: Clear bilaterally with poor air movement with no wheezes or rhonci noted.  Heart: RRR with no murmurs noted. Abdomen: Bowel sounds are present. Soft, non-tender.  Extremities: Trace bilateral  lower extremity edema.   LABS: Basic Metabolic Panel:  Basename 11/02/11 0400 11/01/11 1630 10/31/11 1700 10/31/11 0415  NA 138 140 -- --  K 4.2 4.0 -- --  CL 102 103 -- --  CO2 30 29 -- --  GLUCOSE 102* 153* -- --  BUN 25* 22 -- --  CREATININE 1.30 1.37* -- --  CALCIUM 8.1* 8.0* -- --  MG -- -- 2.6* 3.1*  PHOS -- -- -- --   CBC:  Basename 11/02/11 0400 11/01/11 0414  WBC 17.3* 21.5*  NEUTROABS -- --  HGB 8.2* 9.7*  HCT 24.8* 28.4*  MCV 90.5 90.7  PLT 124* 129*    Current Meds:    . acetaminophen  1,000 mg Oral Q6H   Or  . acetaminophen (TYLENOL) oral liquid 160 mg/5 mL  975 mg Per Tube Q6H  . amLODipine  5 mg Oral Daily  . aspirin EC  325 mg Oral Daily  . bisacodyl  10 mg Oral Daily   Or  . bisacodyl  10 mg Rectal Daily  . cefUROXime (ZINACEF)  IV  1.5 g Intravenous Q12H  . docusate sodium  200 mg Oral Daily  . furosemide  40 mg Intravenous Once  . insulin aspart  0-24 Units Subcutaneous Q4H  . insulin glargine  18 Units Subcutaneous BID  . lisinopril  10 mg Oral Daily  . metoprolol tartrate  25 mg Oral BID   Or  . metoprolol tartrate  25 mg Per Tube BID  . pantoprazole  40 mg Oral Q1200  .  potassium chloride  20 mEq Oral BID  . sodium chloride  3 mL Intravenous Q12H  . DISCONTD: furosemide (LASIX) infusion  8 mg/hr Intravenous Q24H  . DISCONTD: furosemide (LASIX) infusion  10 mg/hr Intravenous Q24H     ASSESSMENT AND PLAN:  1. Type A aortic dissection: POD #3 s/p emergent valve sparing graft repair of aortic root with graft to inominate artery. Hemodynamically stable, still on Dopamine gtt. Rhythm stable. Will need echo before discharge.   2. Post-op anemia  3. DM  4. HTN: Stable.     Gabriel Tran  9/27/20137:28 AM

## 2011-11-02 NOTE — Progress Notes (Signed)
Physical Therapy Treatment Patient Details Name: Gabriel Tran MRN: [id number] DOB: 08/17/1955 Today's Date: 11/02/2011 Time: 4098-1191 PT Time Calculation (min): 30 min  PT Assessment / Plan / Recommendation Comments on Treatment Session  Patient continues to improve.    Follow Up Recommendations  No PT follow up    Barriers to Discharge        Equipment Recommendations  None recommended by PT    Recommendations for Other Services    Frequency Min 3X/week   Plan Discharge plan remains appropriate;Frequency remains appropriate    Precautions / Restrictions Precautions Precautions: Sternal;Fall Restrictions Weight Bearing Restrictions: Yes (sternal precautions)   Pertinent Vitals/Pain     Mobility  Bed Mobility Bed Mobility: Supine to Sit;Sit to Supine Supine to Sit: 3: Mod assist;HOB elevated Sitting - Scoot to Edge of Bed: 4: Min assist Sit to Supine: 3: Mod assist;HOB elevated Details for Bed Mobility Assistance: Reviewed sternal precautions and technique.  Assist to lift trunk from bed and lower trunk to bed with transitions. Transfers Transfers: Sit to Stand;Stand to Sit Sit to Stand: 4: Min guard;Without upper extremity assist;From bed Stand to Sit: 4: Min guard;Without upper extremity assist;To bed Details for Transfer Assistance: Verbal cues for technique Ambulation/Gait Ambulation/Gait Assistance: 4: Min guard Ambulation Distance (Feet): 144 Feet Assistive device: Rolling walker Ambulation/Gait Assistance Details: Patient limited by fatigue this pm.  Safe use of RW - maintaining precautions with use of RW for balance only. Gait Pattern: Step-through pattern;Decreased stride length    PT Goals Acute Rehab PT Goals PT Goal: Supine/Side to Sit - Progress: Progressing toward goal PT Goal: Sit to Supine/Side - Progress: Progressing toward goal PT Goal: Sit to Stand - Progress: Progressing toward goal PT Goal: Stand to Sit - Progress: Progressing toward  goal PT Goal: Ambulate - Progress: Progressing toward goal  Visit Information  Last PT Received On: 11/02/11 Assistance Needed: +1    Subjective Data  Subjective: "This is my third walk today"   Cognition  Overall Cognitive Status: Appears within functional limits for tasks assessed/performed Arousal/Alertness: Awake/alert Orientation Level: Appears intact for tasks assessed Behavior During Session: [facility] for tasks performed    Balance     End of Session PT - End of Session Equipment Utilized During Treatment: Oxygen Activity Tolerance: Patient limited by fatigue (Third walk of the day) Patient left: in bed;with call bell/phone within reach;with family/visitor present Nurse Communication: Mobility status   GP     Vena Austria 11/02/2011, 4:42 PM Durenda Hurt. Renaldo Fiddler, [facility] Pager [phone]

## 2011-11-03 ENCOUNTER — Inpatient Hospital Stay (HOSPITAL_COMMUNITY): Payer: PRIVATE HEALTH INSURANCE | Admitting: Anesthesiology

## 2011-11-03 ENCOUNTER — Encounter (HOSPITAL_COMMUNITY)
Admission: EM | Disposition: A | Discharge status: Other Acute Inpatient Hospital | Payer: Self-pay | Source: Home / Self Care | Attending: Cardiothoracic Surgery

## 2011-11-03 ENCOUNTER — Inpatient Hospital Stay (HOSPITAL_COMMUNITY): Payer: PRIVATE HEALTH INSURANCE

## 2011-11-03 ENCOUNTER — Encounter (HOSPITAL_COMMUNITY): Payer: Self-pay | Admitting: Anesthesiology

## 2011-11-03 DIAGNOSIS — N19 Unspecified kidney failure: Secondary | ICD-10-CM

## 2011-11-03 DIAGNOSIS — I314 Cardiac tamponade: Secondary | ICD-10-CM

## 2011-11-03 LAB — URINALYSIS, ROUTINE W REFLEX MICROSCOPIC
Glucose, UA: NEGATIVE mg/dL
Ketones, ur: 15 mg/dL — AB
Leukocytes, UA: NEGATIVE
Nitrite: NEGATIVE
Protein, ur: NEGATIVE mg/dL
Specific Gravity, Urine: 1.025 (ref 1.005–1.030)
Urobilinogen, UA: 1 mg/dL (ref 0.0–1.0)
pH: 5 (ref 5.0–8.0)

## 2011-11-03 LAB — TYPE AND SCREEN
Antibody Screen: NEGATIVE
Unit division: 0
Unit division: 0
Unit division: 0
Unit division: 0
Unit division: 0

## 2011-11-03 LAB — POCT I-STAT 3, ART BLOOD GAS (G3+)
Acid-base deficit: 4 mmol/L — ABNORMAL HIGH (ref 0.0–2.0)
Acid-base deficit: 4 mmol/L — ABNORMAL HIGH (ref 0.0–2.0)
Acid-base deficit: 8 mmol/L — ABNORMAL HIGH (ref 0.0–2.0)
Bicarbonate: 19.1 mEq/L — ABNORMAL LOW (ref 20.0–24.0)
O2 Saturation: 88 %
O2 Saturation: 89 %
O2 Saturation: 84 %
O2 Saturation: 90 %
Patient temperature: 35.9
TCO2: 20 mmol/L (ref 0–100)
pCO2 arterial: 36.5 mmHg (ref 35.0–45.0)
pCO2 arterial: 41.9 mmHg (ref 35.0–45.0)
pO2, Arterial: 53 mmHg — ABNORMAL LOW (ref 80.0–100.0)

## 2011-11-03 LAB — CBC
HCT: 23.9 % — ABNORMAL LOW (ref 39.0–52.0)
Hemoglobin: 8.1 g/dL — ABNORMAL LOW (ref 13.0–17.0)
MCH: 31.2 pg (ref 26.0–34.0)
MCHC: 33.9 g/dL (ref 30.0–36.0)
MCHC: 34.3 g/dL (ref 30.0–36.0)
MCV: 91.9 fL (ref 78.0–100.0)
Platelets: 163 10*3/uL (ref 150–400)
RBC: 2.6 MIL/uL — ABNORMAL LOW (ref 4.22–5.81)
RDW: 13.7 % (ref 11.5–15.5)
RDW: 13.6 % (ref 11.5–15.5)
WBC: 17.5 10*3/uL — ABNORMAL HIGH (ref 4.0–10.5)

## 2011-11-03 LAB — COMPREHENSIVE METABOLIC PANEL
ALT: 920 U/L — ABNORMAL HIGH (ref 0–53)
AST: 948 U/L — ABNORMAL HIGH (ref 0–37)
Albumin: 2.5 g/dL — ABNORMAL LOW (ref 3.5–5.2)
Alkaline Phosphatase: 52 U/L (ref 39–117)
BUN: 47 mg/dL — ABNORMAL HIGH (ref 6–23)
CO2: 24 mEq/L (ref 19–32)
Calcium: 8.3 mg/dL — ABNORMAL LOW (ref 8.4–10.5)
Chloride: 96 mEq/L (ref 96–112)
Creatinine, Ser: 3.09 mg/dL — ABNORMAL HIGH (ref 0.50–1.35)
GFR calc Af Amer: 25 mL/min — ABNORMAL LOW (ref >90)
GFR calc non Af Amer: 21 mL/min — ABNORMAL LOW (ref >90)
Glucose, Bld: 112 mg/dL — ABNORMAL HIGH (ref 70–99)
Potassium: 4.9 mEq/L (ref 3.5–5.1)
Sodium: 132 mEq/L — ABNORMAL LOW (ref 135–145)
Total Bilirubin: 1 mg/dL (ref 0.3–1.2)
Total Protein: 5.6 g/dL — ABNORMAL LOW (ref 6.0–8.3)

## 2011-11-03 LAB — POCT I-STAT, CHEM 8
BUN: 69 mg/dL — ABNORMAL HIGH (ref 6–23)
Creatinine, Ser: 4.1 mg/dL — ABNORMAL HIGH (ref 0.50–1.35)
Glucose, Bld: 131 mg/dL — ABNORMAL HIGH (ref 70–99)
Hemoglobin: 7.1 g/dL — ABNORMAL LOW (ref 13.0–17.0)
Potassium: 5.4 mEq/L — ABNORMAL HIGH (ref 3.5–5.1)

## 2011-11-03 LAB — POCT I-STAT 7, (LYTES, BLD GAS, ICA,H+H)
Acid-base deficit: 17 mmol/L — ABNORMAL HIGH (ref 0.0–2.0)
Bicarbonate: 13.4 mEq/L — ABNORMAL LOW (ref 20.0–24.0)
O2 Saturation: 85 %
Sodium: 135 mEq/L (ref 135–145)
TCO2: 15 mmol/L (ref 0–100)

## 2011-11-03 LAB — GLUCOSE, CAPILLARY: Glucose-Capillary: 118 mg/dL — ABNORMAL HIGH (ref 70–99)

## 2011-11-03 LAB — PROTIME-INR
INR: 1.89 — ABNORMAL HIGH (ref 0.00–1.49)
INR: 2.59 — ABNORMAL HIGH (ref 0.00–1.49)
Prothrombin Time: 21 seconds — ABNORMAL HIGH (ref 11.6–15.2)
Prothrombin Time: 26.5 seconds — ABNORMAL HIGH (ref 11.6–15.2)

## 2011-11-03 LAB — URINE MICROSCOPIC-ADD ON

## 2011-11-03 LAB — APTT: aPTT: 33 seconds (ref 24–37)

## 2011-11-03 LAB — PREPARE RBC (CROSSMATCH)

## 2011-11-03 LAB — CARBOXYHEMOGLOBIN
Carboxyhemoglobin: 1.2 % (ref 0.5–1.5)
Methemoglobin: 1.3 % (ref 0.0–1.5)
O2 Saturation: 37.2 %
Total hemoglobin: 8.7 g/dL — ABNORMAL LOW (ref 13.5–18.0)

## 2011-11-03 LAB — CREATININE, URINE, RANDOM: Creatinine, Urine: 200.05 mg/dL

## 2011-11-03 LAB — SODIUM, URINE, RANDOM: Sodium, Ur: 15 mEq/L

## 2011-11-03 SURGERY — EXPLORATION POST OPERATIVE OPEN HEART
Anesthesia: General | Site: Chest | Wound class: Clean

## 2011-11-03 MED ORDER — ALBUMIN HUMAN 25 % IV SOLN
50.0000 g | Freq: Once | INTRAVENOUS | Status: DC
  Filled 2011-11-03: qty 200

## 2011-11-03 MED ORDER — SODIUM CHLORIDE 0.9 % IJ SOLN: 3.0000 mL | INTRAMUSCULAR | Status: DC | PRN

## 2011-11-03 MED ORDER — ALBUMIN HUMAN 5 % IV SOLN
250.0000 mL | INTRAVENOUS | Status: AC | PRN
  Administered 2011-11-04 (×2): 250 mL via INTRAVENOUS
  Filled 2011-11-03 (×2): qty 250

## 2011-11-03 MED ORDER — MIDAZOLAM HCL 5 MG/5ML IJ SOLN
INTRAMUSCULAR | Status: DC | PRN
  Administered 2011-11-03 (×2): 5 mg via INTRAVENOUS

## 2011-11-03 MED ORDER — TECHNETIUM TO 99M ALBUMIN AGGREGATED
3.0000 | Freq: Once | INTRAVENOUS | Status: AC | PRN
  Administered 2011-11-03: 3 via INTRAVENOUS

## 2011-11-03 MED ORDER — SODIUM CHLORIDE 0.9 % IV SOLN
INTRAVENOUS | Status: DC | PRN
  Administered 2011-11-03 (×2): via INTRAVENOUS

## 2011-11-03 MED ORDER — SODIUM CHLORIDE 0.9 % IJ SOLN: 3.0000 mL | Freq: Two times a day (BID) | INTRAMUSCULAR | Status: DC

## 2011-11-03 MED ORDER — FAMOTIDINE IN NACL 20-0.9 MG/50ML-% IV SOLN
20.0000 mg | Freq: Two times a day (BID) | INTRAVENOUS | Status: AC
  Administered 2011-11-04: 20 mg via INTRAVENOUS
  Filled 2011-11-03: qty 50

## 2011-11-03 MED ORDER — POTASSIUM CHLORIDE 2 MEQ/ML IV SOLN
80.0000 meq | INTRAVENOUS | Status: DC
  Filled 2011-11-03: qty 40

## 2011-11-03 MED ORDER — DOPAMINE-DEXTROSE 1.6-5 MG/ML-% IV SOLN
INTRAVENOUS | Status: DC | PRN
  Administered 2011-11-03: 15 ug/kg/min via INTRAVENOUS

## 2011-11-03 MED ORDER — SODIUM CHLORIDE 0.9 % IV SOLN
INTRAVENOUS | Status: DC
  Filled 2011-11-03: qty 1

## 2011-11-03 MED ORDER — NITROGLYCERIN IN D5W 200-5 MCG/ML-% IV SOLN
2.0000 ug/min | INTRAVENOUS | Status: DC
  Filled 2011-11-03: qty 250

## 2011-11-03 MED ORDER — PHENYLEPHRINE HCL 10 MG/ML IJ SOLN
30.0000 ug/min | INTRAVENOUS | Status: AC
  Administered 2011-11-03: 10 ug/min via INTRAVENOUS
  Filled 2011-11-03: qty 2

## 2011-11-03 MED ORDER — LORAZEPAM 2 MG/ML IJ SOLN
INTRAMUSCULAR | Status: AC
  Administered 2011-11-03: 0.5 mg via INTRAVENOUS
  Filled 2011-11-03: qty 1

## 2011-11-03 MED ORDER — ALBUMIN HUMAN 5 % IV SOLN
12.5000 g | Freq: Once | INTRAVENOUS | Status: AC
  Administered 2011-11-03: 12.5 g via INTRAVENOUS

## 2011-11-03 MED ORDER — SODIUM BICARBONATE 8.4 % IV SOLN
50.0000 meq | Freq: Once | INTRAVENOUS | Status: AC
  Administered 2011-11-03: 50 meq via INTRAVENOUS

## 2011-11-03 MED ORDER — DEXTROSE 5 % IV SOLN
INTRAVENOUS | Status: DC | PRN
  Administered 2011-11-03: 20:00:00 via INTRAVENOUS

## 2011-11-03 MED ORDER — LORAZEPAM 2 MG/ML IJ SOLN
0.5000 mg | INTRAMUSCULAR | Status: DC | PRN
  Administered 2011-11-03 (×2): 0.5 mg via INTRAVENOUS

## 2011-11-03 MED ORDER — CALCIUM CHLORIDE 10 % IV SOLN
1.0000 g | Freq: Once | INTRAVENOUS | Status: AC
  Administered 2011-11-03: 1 g via INTRAVENOUS

## 2011-11-03 MED ORDER — FENTANYL CITRATE 0.05 MG/ML IJ SOLN
INTRAMUSCULAR | Status: DC | PRN
  Administered 2011-11-03: 250 ug via INTRAVENOUS
  Administered 2011-11-03: 500 ug via INTRAVENOUS
  Administered 2011-11-03 (×2): 250 ug via INTRAVENOUS

## 2011-11-03 MED ORDER — METOPROLOL TARTRATE 25 MG/10 ML ORAL SUSPENSION
12.5000 mg | Freq: Two times a day (BID) | ORAL | Status: DC
  Filled 2011-11-03 (×7): qty 5

## 2011-11-03 MED ORDER — SODIUM BICARBONATE 8.4 % IV SOLN
INTRAVENOUS | Status: AC
  Administered 2011-11-03: 50 meq
  Filled 2011-11-03: qty 50

## 2011-11-03 MED ORDER — DEXMEDETOMIDINE HCL IN NACL 200 MCG/50ML IV SOLN
0.1000 ug/kg/h | INTRAVENOUS | Status: DC
  Administered 2011-11-04 (×2): 0.7 ug/kg/h via INTRAVENOUS
  Filled 2011-11-03 (×2): qty 50

## 2011-11-03 MED ORDER — ALBUMIN HUMAN 5 % IV SOLN
INTRAVENOUS | Status: AC
  Administered 2011-11-03: 12.5 g via INTRAVENOUS
  Filled 2011-11-03: qty 250

## 2011-11-03 MED ORDER — PHENYLEPHRINE HCL 10 MG/ML IJ SOLN
0.0000 ug/min | INTRAVENOUS | Status: DC
  Filled 2011-11-03 (×2): qty 2

## 2011-11-03 MED ORDER — VANCOMYCIN HCL 1000 MG IV SOLR
1500.0000 mg | INTRAVENOUS | Status: AC
  Administered 2011-11-03: 1500 mg via INTRAVENOUS
  Filled 2011-11-03: qty 1500

## 2011-11-03 MED ORDER — SODIUM BICARBONATE 8.4 % IV SOLN
50.0000 meq | Freq: Once | INTRAVENOUS | Status: AC
  Filled 2011-11-03: qty 50

## 2011-11-03 MED ORDER — DOBUTAMINE IN D5W 4-5 MG/ML-% IV SOLN
2.5000 ug/kg/min | INTRAVENOUS | Status: DC
  Filled 2011-11-03: qty 250

## 2011-11-03 MED ORDER — OXYCODONE HCL 5 MG PO TABS: 5.0000 mg | ORAL_TABLET | ORAL | Status: DC | PRN

## 2011-11-03 MED ORDER — MORPHINE SULFATE 2 MG/ML IJ SOLN
2.0000 mg | INTRAMUSCULAR | Status: DC | PRN
  Administered 2011-11-04 (×5): 2 mg via INTRAVENOUS
  Administered 2011-11-06: 4 mg via INTRAVENOUS
  Administered 2011-11-06 – 2011-11-07 (×2): 2 mg via INTRAVENOUS
  Administered 2011-11-07: 4 mg via INTRAVENOUS
  Administered 2011-11-07 – 2011-11-08 (×11): 2 mg via INTRAVENOUS
  Administered 2011-11-08 (×2): 4 mg via INTRAVENOUS
  Administered 2011-11-08: 2 mg via INTRAVENOUS
  Administered 2011-11-09 (×2): 4 mg via INTRAVENOUS
  Administered 2011-11-09: 2 mg via INTRAVENOUS
  Administered 2011-11-09 (×2): 4 mg via INTRAVENOUS
  Administered 2011-11-09: 2 mg via INTRAVENOUS
  Filled 2011-11-03: qty 1
  Filled 2011-11-03 (×2): qty 2
  Filled 2011-11-03 (×2): qty 1
  Filled 2011-11-03: qty 2
  Filled 2011-11-03 (×6): qty 1
  Filled 2011-11-03 (×2): qty 2
  Filled 2011-11-03 (×6): qty 1
  Filled 2011-11-03: qty 2
  Filled 2011-11-03: qty 1
  Filled 2011-11-03: qty 2
  Filled 2011-11-03 (×5): qty 1
  Filled 2011-11-03: qty 2

## 2011-11-03 MED ORDER — DOPAMINE-DEXTROSE 3.2-5 MG/ML-% IV SOLN
2.0000 ug/kg/min | INTRAVENOUS | Status: DC
  Filled 2011-11-03: qty 250

## 2011-11-03 MED ORDER — DOCUSATE SODIUM 100 MG PO CAPS: 200.0000 mg | ORAL_CAPSULE | Freq: Every day | ORAL | Status: DC

## 2011-11-03 MED ORDER — LACTATED RINGERS IV SOLN
INTRAVENOUS | Status: DC | PRN
  Administered 2011-11-03: 19:00:00 via INTRAVENOUS

## 2011-11-03 MED ORDER — METOPROLOL TARTRATE 12.5 MG HALF TABLET
12.5000 mg | ORAL_TABLET | Freq: Two times a day (BID) | ORAL | Status: DC
  Filled 2011-11-03 (×7): qty 1

## 2011-11-03 MED ORDER — PLASMA-LYTE 148 IV SOLN
INTRAVENOUS | Status: DC
  Filled 2011-11-03: qty 2.5

## 2011-11-03 MED ORDER — MIDAZOLAM HCL 2 MG/2ML IJ SOLN
2.0000 mg | INTRAMUSCULAR | Status: DC | PRN
  Administered 2011-11-04 – 2011-11-08 (×27): 2 mg via INTRAVENOUS
  Administered 2011-11-08: 1 mg via INTRAVENOUS
  Administered 2011-11-09 (×3): 2 mg via INTRAVENOUS
  Filled 2011-11-03 (×28): qty 2
  Filled 2011-11-03: qty 4
  Filled 2011-11-03 (×7): qty 2

## 2011-11-03 MED ORDER — ALBUMIN HUMAN 5 % IV SOLN
INTRAVENOUS | Status: DC | PRN
  Administered 2011-11-03 (×2): via INTRAVENOUS

## 2011-11-03 MED ORDER — XENON XE 133 GAS
10.0000 | GAS_FOR_INHALATION | Freq: Once | RESPIRATORY_TRACT | Status: AC | PRN
  Administered 2011-11-03: 10 via RESPIRATORY_TRACT

## 2011-11-03 MED ORDER — AMIODARONE HCL IN DEXTROSE 360-4.14 MG/200ML-% IV SOLN
INTRAVENOUS | Status: DC | PRN
  Administered 2011-11-03: 60 mg/h via INTRAVENOUS

## 2011-11-03 MED ORDER — SODIUM CHLORIDE 0.9 % IV BOLUS (SEPSIS): 500.0000 mL | Freq: Once | INTRAVENOUS | Status: DC

## 2011-11-03 MED ORDER — LACTATED RINGERS IV SOLN
INTRAVENOUS | Status: DC
  Administered 2011-11-03: 20 mL/h via INTRAVENOUS

## 2011-11-03 MED ORDER — LACTATED RINGERS IV SOLN: 500.0000 mL | Freq: Once | INTRAVENOUS | Status: AC | PRN

## 2011-11-03 MED ORDER — ETOMIDATE 2 MG/ML IV SOLN
INTRAVENOUS | Status: DC | PRN
  Administered 2011-11-03: 20 mg via INTRAVENOUS

## 2011-11-03 MED ORDER — ACETAMINOPHEN 160 MG/5ML PO SOLN
975.0000 mg | Freq: Four times a day (QID) | ORAL | Status: DC
  Administered 2011-11-04 – 2011-11-05 (×4): 975 mg
  Filled 2011-11-03 (×2): qty 40.6

## 2011-11-03 MED ORDER — INSULIN REGULAR BOLUS VIA INFUSION
0.0000 [IU] | Freq: Three times a day (TID) | INTRAVENOUS | Status: DC
  Filled 2011-11-03: qty 10

## 2011-11-03 MED ORDER — SODIUM CHLORIDE 0.9 % IJ SOLN
OROMUCOSAL | Status: DC | PRN
  Administered 2011-11-03: 21:00:00 via TOPICAL

## 2011-11-03 MED ORDER — HEPARIN (PORCINE) IN NACL 100-0.45 UNIT/ML-% IJ SOLN
1200.0000 [IU]/h | INTRAMUSCULAR | Status: DC
  Administered 2011-11-03: 1000 [IU]/h via INTRAVENOUS
  Filled 2011-11-03 (×2): qty 250

## 2011-11-03 MED ORDER — ALBUTEROL SULFATE HFA 108 (90 BASE) MCG/ACT IN AERS
INHALATION_SPRAY | RESPIRATORY_TRACT | Status: DC | PRN
  Administered 2011-11-03: 4 via RESPIRATORY_TRACT
  Administered 2011-11-03: 2 via RESPIRATORY_TRACT
  Administered 2011-11-03: 4 via RESPIRATORY_TRACT

## 2011-11-03 MED ORDER — ASPIRIN EC 325 MG PO TBEC
325.0000 mg | DELAYED_RELEASE_TABLET | Freq: Every day | ORAL | Status: DC
  Filled 2011-11-03 (×3): qty 1

## 2011-11-03 MED ORDER — VECURONIUM BROMIDE 10 MG IV SOLR
INTRAVENOUS | Status: DC | PRN
  Administered 2011-11-03 (×2): 10 mg via INTRAVENOUS

## 2011-11-03 MED ORDER — DEXTROSE 5 % IV SOLN
1.5000 g | INTRAVENOUS | Status: AC
  Administered 2011-11-03: 1.5 g via INTRAVENOUS
  Filled 2011-11-03: qty 1.5

## 2011-11-03 MED ORDER — SODIUM CHLORIDE 0.9 % IV SOLN
10.0000 g | INTRAVENOUS | Status: DC | PRN
  Administered 2011-11-03: 5 g/h via INTRAVENOUS

## 2011-11-03 MED ORDER — POTASSIUM CHLORIDE 10 MEQ/50ML IV SOLN: 10.0000 meq | INTRAVENOUS | Status: AC

## 2011-11-03 MED ORDER — ALBUMIN HUMAN 5 % IV SOLN
INTRAVENOUS | Status: AC
  Administered 2011-11-03: 12.5 g
  Filled 2011-11-03: qty 1000

## 2011-11-03 MED ORDER — SODIUM CHLORIDE 0.9 % IV SOLN: 250.0000 mL | INTRAVENOUS | Status: DC

## 2011-11-03 MED ORDER — ASPIRIN 81 MG PO CHEW
324.0000 mg | CHEWABLE_TABLET | Freq: Every day | ORAL | Status: DC
  Administered 2011-11-05: 324 mg
  Filled 2011-11-03: qty 4

## 2011-11-03 MED ORDER — BISACODYL 5 MG PO TBEC: 10.0000 mg | DELAYED_RELEASE_TABLET | Freq: Every day | ORAL | Status: DC

## 2011-11-03 MED ORDER — DOPAMINE-DEXTROSE 3.2-5 MG/ML-% IV SOLN
2.5000 ug/kg/min | INTRAVENOUS | Status: DC
  Administered 2011-11-03: 2.5 ug/kg/min via INTRAVENOUS
  Administered 2011-11-04: 10 ug/kg/min via INTRAVENOUS
  Administered 2011-11-07: 2.5 ug/kg/min via INTRAVENOUS
  Filled 2011-11-03 (×5): qty 250

## 2011-11-03 MED ORDER — SODIUM CHLORIDE 0.9 % IV SOLN
INTRAVENOUS | Status: DC
  Administered 2011-11-04: 15 mL/h via INTRAVENOUS
  Administered 2011-11-04: 08:00:00 via INTRAVENOUS
  Administered 2011-11-04: 20 mL via INTRAVENOUS
  Administered 2011-11-06 – 2011-11-13 (×8): via INTRAVENOUS

## 2011-11-03 MED ORDER — VANCOMYCIN HCL 1000 MG IV SOLR
INTRAVENOUS | Status: DC | PRN
  Administered 2011-11-03: 1000 mg

## 2011-11-03 MED ORDER — MORPHINE SULFATE 2 MG/ML IJ SOLN
1.0000 mg | INTRAMUSCULAR | Status: AC | PRN
  Administered 2011-11-04 (×3): 2 mg via INTRAVENOUS
  Filled 2011-11-03 (×5): qty 1

## 2011-11-03 MED ORDER — EPINEPHRINE HCL 1 MG/ML IJ SOLN
1000.0000 ug | INTRAVENOUS | Status: DC | PRN
  Administered 2011-11-03: 15 ug/min via INTRAVENOUS

## 2011-11-03 MED ORDER — EPINEPHRINE HCL 1 MG/ML IJ SOLN
0.5000 ug/min | INTRAMUSCULAR | Status: DC
  Filled 2011-11-03: qty 4

## 2011-11-03 MED ORDER — ACETAMINOPHEN 160 MG/5ML PO SOLN
650.0000 mg | ORAL | Status: AC
  Filled 2011-11-03 (×2): qty 40.6

## 2011-11-03 MED ORDER — VANCOMYCIN HCL 1000 MG IV SOLR
INTRAVENOUS | Status: AC
  Filled 2011-11-03: qty 1000

## 2011-11-03 MED ORDER — MAGNESIUM SULFATE 40 MG/ML IJ SOLN
4.0000 g | Freq: Once | INTRAMUSCULAR | Status: AC
  Administered 2011-11-04: 4 g via INTRAVENOUS
  Filled 2011-11-03: qty 100

## 2011-11-03 MED ORDER — ACETAMINOPHEN 500 MG PO TABS
1000.0000 mg | ORAL_TABLET | Freq: Four times a day (QID) | ORAL | Status: DC
  Filled 2011-11-03 (×8): qty 2

## 2011-11-03 MED ORDER — VANCOMYCIN HCL IN DEXTROSE 1-5 GM/200ML-% IV SOLN: 1000.0000 mg | Freq: Once | INTRAVENOUS | Status: DC

## 2011-11-03 MED ORDER — MIDAZOLAM HCL 2 MG/2ML IJ SOLN
INTRAMUSCULAR | Status: AC
  Filled 2011-11-03: qty 4

## 2011-11-03 MED ORDER — MAGNESIUM SULFATE 50 % IJ SOLN
40.0000 meq | INTRAMUSCULAR | Status: AC
  Filled 2011-11-03: qty 10

## 2011-11-03 MED ORDER — AMINOCAPROIC ACID 250 MG/ML IV SOLN
INTRAVENOUS | Status: DC
  Filled 2011-11-03: qty 40

## 2011-11-03 MED ORDER — ONDANSETRON HCL 4 MG/2ML IJ SOLN: 4.0000 mg | Freq: Four times a day (QID) | INTRAMUSCULAR | Status: DC | PRN

## 2011-11-03 MED ORDER — PANTOPRAZOLE SODIUM 40 MG PO TBEC: 40.0000 mg | DELAYED_RELEASE_TABLET | Freq: Every day | ORAL | Status: DC

## 2011-11-03 MED ORDER — ACETAMINOPHEN 650 MG RE SUPP: 650.0000 mg | RECTAL | Status: AC

## 2011-11-03 MED ORDER — DEXMEDETOMIDINE HCL IN NACL 400 MCG/100ML IV SOLN
0.1000 ug/kg/h | INTRAVENOUS | Status: AC
  Administered 2011-11-03: 0.7 ug/kg/h via INTRAVENOUS
  Filled 2011-11-03: qty 100

## 2011-11-03 MED ORDER — VANCOMYCIN HCL IN DEXTROSE 1-5 GM/200ML-% IV SOLN
1000.0000 mg | INTRAVENOUS | Status: DC
  Filled 2011-11-03: qty 200

## 2011-11-03 MED ORDER — SODIUM CHLORIDE 0.45 % IV SOLN: INTRAVENOUS | Status: DC

## 2011-11-03 MED ORDER — METOPROLOL TARTRATE 1 MG/ML IV SOLN
2.5000 mg | INTRAVENOUS | Status: DC | PRN
  Administered 2011-11-05: 5 mg via INTRAVENOUS
  Administered 2011-11-09: 2.5 mg via INTRAVENOUS
  Administered 2011-11-09: 3 mg via INTRAVENOUS
  Administered 2011-11-10 (×2): 2.5 mg via INTRAVENOUS
  Filled 2011-11-03: qty 5

## 2011-11-03 MED ORDER — BISACODYL 10 MG RE SUPP: 10.0000 mg | Freq: Every day | RECTAL | Status: DC

## 2011-11-03 MED ORDER — NITROGLYCERIN IN D5W 200-5 MCG/ML-% IV SOLN
0.0000 ug/min | INTRAVENOUS | Status: DC
  Administered 2011-11-06: 25 ug/min via INTRAVENOUS
  Filled 2011-11-03: qty 250

## 2011-11-03 MED ORDER — CEFUROXIME SODIUM 1.5 G IJ SOLR
1.5000 g | Freq: Two times a day (BID) | INTRAMUSCULAR | Status: DC
  Administered 2011-11-04: 1.5 g via INTRAVENOUS
  Filled 2011-11-03 (×3): qty 1.5

## 2011-11-03 MED ORDER — LORAZEPAM BOLUS VIA INFUSION
0.5000 mg | INTRAVENOUS | Status: DC | PRN
  Filled 2011-11-03: qty 1

## 2011-11-03 MED ORDER — AMIODARONE HCL IN DEXTROSE 360-4.14 MG/200ML-% IV SOLN
60.0000 mg/h | INTRAVENOUS | Status: AC
  Administered 2011-11-03: 33.33 mL/h via INTRAVENOUS
  Filled 2011-11-03: qty 200

## 2011-11-03 MED ORDER — DEXTROSE 5 % IV SOLN
750.0000 mg | INTRAVENOUS | Status: DC
  Filled 2011-11-03: qty 750

## 2011-11-03 MED ORDER — SODIUM BICARBONATE 8.4 % IV SOLN
INTRAVENOUS | Status: DC | PRN
  Administered 2011-11-03 (×2): 100 meq via INTRAVENOUS

## 2011-11-03 MED ORDER — MORPHINE SULFATE 4 MG/ML IJ SOLN
INTRAMUSCULAR | Status: AC
  Administered 2011-11-03: 4 mg
  Filled 2011-11-03: qty 1

## 2011-11-03 MED ORDER — 0.9 % SODIUM CHLORIDE (POUR BTL) OPTIME
TOPICAL | Status: DC | PRN
  Administered 2011-11-03: 5000 mL

## 2011-11-03 SURGICAL SUPPLY — 102 items
ADAPTER CARDIO PERF ANTE/RETRO (ADAPTER) ×3 IMPLANT
ADPR PRFSN 84XANTGRD RTRGD (ADAPTER) ×1
ATTRACTOMAT 16X20 MAGNETIC DRP (DRAPES) ×3 IMPLANT
BAG DECANTER FOR FLEXI CONT (MISCELLANEOUS) ×3 IMPLANT
BANDAGE ELASTIC 4 VELCRO ST LF (GAUZE/BANDAGES/DRESSINGS) ×3 IMPLANT
BANDAGE ELASTIC 6 VELCRO ST LF (GAUZE/BANDAGES/DRESSINGS) ×3 IMPLANT
BANDAGE GAUZE ELAST BULKY 4 IN (GAUZE/BANDAGES/DRESSINGS) ×3 IMPLANT
BASKET HEART  (ORDER IN 25'S) (MISCELLANEOUS) ×1
BASKET HEART (ORDER IN 25'S) (MISCELLANEOUS) ×1
BASKET HEART (ORDER IN 25S) (MISCELLANEOUS) ×1 IMPLANT
BLADE STERNUM SYSTEM 6 (BLADE) ×3 IMPLANT
BLADE SURG 12 STRL SS (BLADE) ×3 IMPLANT
BLADE SURG ROTATE 9660 (MISCELLANEOUS) IMPLANT
CANISTER SUCTION 2500CC (MISCELLANEOUS) ×3 IMPLANT
CANNULA AORTIC HI-FLOW 6.5M20F (CANNULA) ×3 IMPLANT
CANNULA GUNDRY RCSP 15FR (MISCELLANEOUS) ×3 IMPLANT
CANNULA VENOUS MAL SGL STG 40 (MISCELLANEOUS) IMPLANT
CANNULAE VENOUS MAL SGL STG 40 (MISCELLANEOUS)
CATH CPB KIT VANTRIGT (MISCELLANEOUS) ×3 IMPLANT
CATH ROBINSON RED A/P 18FR (CATHETERS) ×9 IMPLANT
CATH THORACIC 28FR (CATHETERS) IMPLANT
CATH THORACIC 28FR RT ANG (CATHETERS) IMPLANT
CATH THORACIC 36FR (CATHETERS) IMPLANT
CATH THORACIC 36FR RT ANG (CATHETERS) ×6 IMPLANT
CLIP TI WIDE RED SMALL 24 (CLIP) IMPLANT
CLOTH BEACON ORANGE TIMEOUT ST (SAFETY) ×3 IMPLANT
CONN ST 1/4X3/8  BEN (MISCELLANEOUS) ×4
CONN ST 1/4X3/8 BEN (MISCELLANEOUS) IMPLANT
COVER SURGICAL LIGHT HANDLE (MISCELLANEOUS) ×3 IMPLANT
CRADLE DONUT ADULT HEAD (MISCELLANEOUS) ×3 IMPLANT
DRAIN CHANNEL 32F RND 10.7 FF (WOUND CARE) ×3 IMPLANT
DRAPE CARDIOVASCULAR INCISE (DRAPES) ×3
DRAPE SLUSH MACHINE 52X66 (DRAPES) IMPLANT
DRAPE SLUSH/WARMER DISC (DRAPES) IMPLANT
DRAPE SRG 135X102X78XABS (DRAPES) ×1 IMPLANT
DRSG COVADERM 4X14 (GAUZE/BANDAGES/DRESSINGS) ×3 IMPLANT
DRSG VAC ATS LRG SENSATRAC (GAUZE/BANDAGES/DRESSINGS) ×4 IMPLANT
DRSG VERSA FOAM LRG 10X15 (GAUZE/BANDAGES/DRESSINGS) ×2 IMPLANT
ELECT BLADE 4.0 EZ CLEAN MEGAD (MISCELLANEOUS) ×3
ELECT BLADE 6.5 EXT (BLADE) ×3 IMPLANT
ELECT CAUTERY BLADE 6.4 (BLADE) ×3 IMPLANT
ELECT REM PT RETURN 9FT ADLT (ELECTROSURGICAL) ×6
ELECTRODE BLDE 4.0 EZ CLN MEGD (MISCELLANEOUS) ×1 IMPLANT
ELECTRODE REM PT RTRN 9FT ADLT (ELECTROSURGICAL) ×2 IMPLANT
GLOVE BIO SURGEON STRL SZ7.5 (GLOVE) ×6 IMPLANT
GOWN STRL NON-REIN LRG LVL3 (GOWN DISPOSABLE) ×12 IMPLANT
HEMOSTAT POWDER SURGIFOAM 1G (HEMOSTASIS) ×9 IMPLANT
HEMOSTAT SURGICEL 2X14 (HEMOSTASIS) ×3 IMPLANT
INSERT FOGARTY XLG (MISCELLANEOUS) IMPLANT
KIT BASIN OR (CUSTOM PROCEDURE TRAY) ×3 IMPLANT
KIT ROOM TURNOVER OR (KITS) ×3 IMPLANT
KIT SUCTION CATH 14FR (SUCTIONS) ×3 IMPLANT
KIT VASOVIEW W/TROCAR VH 2000 (KITS) ×3 IMPLANT
LEAD PACING MYOCARDI (MISCELLANEOUS) ×3 IMPLANT
MARKER GRAFT CORONARY BYPASS (MISCELLANEOUS) ×9 IMPLANT
NS IRRIG 1000ML POUR BTL (IV SOLUTION) ×15 IMPLANT
PACK OPEN HEART (CUSTOM PROCEDURE TRAY) ×3 IMPLANT
PAD ARMBOARD 7.5X6 YLW CONV (MISCELLANEOUS) ×6 IMPLANT
PENCIL BUTTON HOLSTER BLD 10FT (ELECTRODE) ×3 IMPLANT
PUNCH AORTIC ROTATE 4.0MM (MISCELLANEOUS) IMPLANT
PUNCH AORTIC ROTATE 4.5MM 8IN (MISCELLANEOUS) IMPLANT
PUNCH AORTIC ROTATE 5MM 8IN (MISCELLANEOUS) IMPLANT
SPONGE GAUZE 4X4 12PLY (GAUZE/BANDAGES/DRESSINGS) ×6 IMPLANT
STOPCOCK 4 WAY LG BORE MALE ST (IV SETS) ×2 IMPLANT
SUT BONE WAX W31G (SUTURE) ×3 IMPLANT
SUT MNCRL AB 4-0 PS2 18 (SUTURE) IMPLANT
SUT PROLENE 3 0 SH DA (SUTURE) IMPLANT
SUT PROLENE 3 0 SH1 36 (SUTURE) IMPLANT
SUT PROLENE 4 0 RB 1 (SUTURE) ×3
SUT PROLENE 4 0 SH DA (SUTURE) ×3 IMPLANT
SUT PROLENE 4-0 RB1 .5 CRCL 36 (SUTURE) ×1 IMPLANT
SUT PROLENE 5 0 C 1 36 (SUTURE) ×2 IMPLANT
SUT PROLENE 6 0 C 1 30 (SUTURE) IMPLANT
SUT PROLENE 7 0 DA (SUTURE) IMPLANT
SUT PROLENE 7.0 RB 3 (SUTURE) ×9 IMPLANT
SUT PROLENE 8 0 BV175 6 (SUTURE) IMPLANT
SUT PROLENE BLUE 7 0 (SUTURE) ×3 IMPLANT
SUT SILK  1 MH (SUTURE)
SUT SILK 1 MH (SUTURE) IMPLANT
SUT SILK 2 0 SH CR/8 (SUTURE) IMPLANT
SUT SILK 3 0 SH CR/8 (SUTURE) IMPLANT
SUT STEEL 6MS V (SUTURE) ×6 IMPLANT
SUT STEEL STERNAL CCS#1 18IN (SUTURE) IMPLANT
SUT STEEL SZ 6 DBL 3X14 BALL (SUTURE) ×3 IMPLANT
SUT VIC AB 1 CTX 36 (SUTURE) ×6
SUT VIC AB 1 CTX36XBRD ANBCTR (SUTURE) ×2 IMPLANT
SUT VIC AB 2-0 CT1 27 (SUTURE)
SUT VIC AB 2-0 CT1 TAPERPNT 27 (SUTURE) IMPLANT
SUT VIC AB 2-0 CTX 27 (SUTURE) IMPLANT
SUT VIC AB 3-0 SH 27 (SUTURE)
SUT VIC AB 3-0 SH 27X BRD (SUTURE) IMPLANT
SUT VIC AB 3-0 X1 27 (SUTURE) IMPLANT
SUT VICRYL 4-0 PS2 18IN ABS (SUTURE) IMPLANT
SUTURE E-PAK OPEN HEART (SUTURE) ×3 IMPLANT
SYSTEM SAHARA CHEST DRAIN ATS (WOUND CARE) ×3 IMPLANT
TOWEL OR 17X24 6PK STRL BLUE (TOWEL DISPOSABLE) ×6 IMPLANT
TOWEL OR 17X26 10 PK STRL BLUE (TOWEL DISPOSABLE) ×6 IMPLANT
TRAY FOLEY IC TEMP SENS 14FR (CATHETERS) ×3 IMPLANT
TUBING ART PRESS 48 MALE/FEM (TUBING) ×6 IMPLANT
TUBING INSUFFLATION 10FT LAP (TUBING) ×3 IMPLANT
UNDERPAD 30X30 INCONTINENT (UNDERPADS AND DIAPERS) ×3 IMPLANT
WATER STERILE IRR 1000ML POUR (IV SOLUTION) ×6 IMPLANT

## 2011-11-03 NOTE — Progress Notes (Signed)
4 Days Post-Op Procedure(s) (LRB): THORACIC ASCENDING ANEURYSM REPAIR (AAA) (N/A) Subjective: Status post repair of Type A ascending thoracic dissection requiring resuspension of aVR, straight graft replacement ascending aorta, arch reconstruction with side branch graft to the innominate artery Postoperative hypoxia with left lower lobe atelectasis requiring supplemental oxygen Blood pressure stable sinus rhythm 2-D echocardiogram performed yesterday shows good LV function small pericardial effusion no significant AI Acute increase in creatinine 1.4-3.0 associated with significant increase in LFTs--. We'll stop lisinopril, transfuse one unit of blood for hemoglobin 8.1 and monitor CVP-keep in ICU Objective: Vital signs in last 24 hours: Temp:  [97.5 F (36.4 C)-98.3 F (36.8 C)] 97.9 F (36.6 C) (09/28 0753) Pulse Rate:  [57-113] 76  (09/28 0700) Cardiac Rhythm:  [-] Normal sinus rhythm;Bundle branch block (09/28 0600) Resp:  [11-25] 17  (09/28 0700) BP: (90-139)/(46-93) 112/73 mmHg (09/28 0700) SpO2:  [89 %-96 %] 92 % (09/28 0700) FiO2 (%):  [50 %] 50 % (09/28 0600)  Hemodynamic parameters for last 24 hours:    Intake/Output from previous day: 09/27 0701 - 09/28 0700 In: 1272.2 [P.O.:780; I.V.:492.2] Out: 490 [Urine:490] Intake/Output this shift:    Exam Neuro intact slight weakness right-hand stable Breath sounds clear No cardiac murmur Abdomen soft nontender Extremities warm with positive pulses  Lab Results:  Basename 11/03/11 0345 11/02/11 0400  WBC 17.5* 17.3*  HGB 8.1* 8.2*  HCT 23.9* 24.8*  PLT 163 124*   BMET:  Basename 11/03/11 0345 11/02/11 0400  NA 132* 138  K 4.9 4.2  CL 96 102  CO2 24 30  GLUCOSE 112* 102*  BUN 47* 25*  CREATININE 3.09* 1.30  CALCIUM 8.3* 8.1*    PT/INR: No results found for this basename: LABPROT,INR in the last 72 hours ABG    Component Value Date/Time   PHART 7.400 10/31/2011 1722   HCO3 27.5* 10/31/2011 1722   TCO2 29  10/31/2011 1722   ACIDBASEDEF 4.0* 10/30/2011 2107   O2SAT 88.0 10/31/2011 1722   CBG (last 3)   Basename 11/03/11 0751 11/03/11 0351 11/03/11 0001  GLUCAP 112* 104* 118*    Assessment/Plan: S/P Procedure(s) (LRB): THORACIC ASCENDING ANEURYSM REPAIR (AAA) (N/A) Acute postoperative ATN from probable hypoperfusion, relative hypotension and ACE inhibitor-will transfuse one unit of packed cells stop ACE inhibitor and monitor CVP, reinsert Foley catheter to monitor urine output   LOS: 5 days    VAN TRIGT III,PETER 11/03/2011

## 2011-11-03 NOTE — Progress Notes (Signed)
I was summoned by a family member into pt's room because pt had removed the bipap mask.  Found pt very agitated, attempting to pull lines and tubes, thrashing limbs, and diaphoretic.  Attempted to calm pt and get assistance to reposition him in the bed and replace the bipap mask.  Within one minute of me entering pt's room, he went unresponsive and apneic, and SB in the 30's.  Immediately called a code and began ventilating pt with ambu bag.  Dr. Donata Clay paged and quickly arrived at bedside.  Decision made by Dr. Donata Clay to reopen the chest in pt's room, and then to proceed back to the OR.  Accompanied pt to the OR along with anesthesia and surgeon.  Updated pt's wife Arline Asp by phone.

## 2011-11-03 NOTE — Code Documentation (Addendum)
Respond to code blue at 0650 pm.  Patient had Emergency repair of a type A ascending thoracic aortic dissection on 10/30/11 y Dr. Donata Clay, and he was diagnosed with right lung PE on V/Q scan today. He was found to be unresponsive and apnea. Heart rhythm was noted to be asystole. Code team arrived and ACLS code protocol initiated. Dr. Donata Clay arrived on the scene at 0655 pm and will run the Code after discussing with him.

## 2011-11-03 NOTE — Transfer of Care (Signed)
Immediate Anesthesia Transfer of Care Note  Patient: Gabriel Tran  Procedure(s) Performed: Procedure(s) (LRB) with comments: EXPLORATION POST OPERATIVE OPEN HEART (N/A)  Patient Location: SICU  Anesthesia Type: General  Level of Consciousness: sedated and Patient remains intubated per anesthesia plan  Airway & Oxygen Therapy: Patient remains intubated per anesthesia plan and Patient placed on Ventilator (see vital sign flow sheet for setting)  Post-op Assessment: Report given to SICU RN and Post -op Vital signs reviewed and stable  Post vital signs: Reviewed and stable  Complications: No apparent anesthesia complications

## 2011-11-03 NOTE — Transfer of Care (Incomplete)
Immediate Anesthesia Transfer of Care Note  Patient: Gabriel Tran  Procedure(s) Performed: Procedure(s) (LRB) with comments: EXPLORATION POST OPERATIVE OPEN HEART (N/A)  Patient Location: {PLACES; ANE POST:19477::"PACU"}  Anesthesia Type: {PROCEDURES; ANE POST ANESTHESIA TYPE:19480}  Level of Consciousness: {FINDINGS; ANE POST LEVEL OF CONSCIOUSNESS:19484}  Airway & Oxygen Therapy: {Exam; oxygen device:30095}  Post-op Assessment: {ASSESSMENT;POST-OP ZOXWRU:04540}  Post vital signs: {DESC; ANE POST JWJXBJ:47829}  Complications: {FINDINGS; ANE POST COMPLICATIONS:19485}

## 2011-11-03 NOTE — Anesthesia Postprocedure Evaluation (Signed)
Anesthesia Post Note  Patient: Gabriel Tran  Procedure(s) Performed: Procedure(s) (LRB): EXPLORATION POST OPERATIVE OPEN HEART (N/A)  Anesthesia type: General  Patient location: ICU  Post pain: Pain level controlled  Post assessment: Post-op Vital signs reviewed  Last Vitals:  Filed Vitals:   11/03/11 2330  BP:   Pulse: 99  Temp: 35.3 C  Resp: 6    Post vital signs: stable  Level of consciousness: Patient remains intubated per anesthesia plan  Complications: No apparent anesthesia complications

## 2011-11-03 NOTE — Progress Notes (Signed)
VASCULAR LAB PRELIMINARY  PRELIMINARY  PRELIMINARY  PRELIMINARY  Renal artery duplex completed.    Preliminary report:  Bilateral renal arteries appear patent with no obvious stenosis.  Trystian Crisanto, RVT 11/03/2011, 4:41 PM

## 2011-11-03 NOTE — Progress Notes (Signed)
Subjective: Patient complains of chest sorness.  Breathing is Ok Objective: Filed Vitals:   11/03/11 0535 11/03/11 0600 11/03/11 0700 11/03/11 0753  BP:  110/64 112/73   Pulse: 69 67 76   Temp:    97.9 F (36.6 C)  TempSrc:    Oral  Resp: 16 15 17    Height:      Weight:      SpO2: 96% 96% 92%    Weight change:   Intake/Output Summary (Last 24 hours) at 11/03/11 1003 Last data filed at 11/03/11 0700  Gross per 24 hour  Intake    720 ml  Output    250 ml  Net    470 ml   Total I/O:  + 2.1 L  General: Alert, awake, oriented x3, in no acute distress Neck:  JVP is normal Heart: Regular rate and rhythm, without murmurs, rubs, gallops.  Lungs: Clear to auscultation.  No rales or wheezes. Exemities:  No edema.   Neuro: Grossly intact, nonfocal.  Tele:  SR  1 3 sec pause at 4:30 AM. Lab Results: Results for orders placed during the hospital encounter of 10/29/11 (from the past 24 hour(s))  GLUCOSE, CAPILLARY     Status: Abnormal   Collection Time   11/02/11 12:08 PM      Component Value Range   Glucose-Capillary 135 (*) 70 - 99 mg/dL   Comment 1 Documented in Chart     Comment 2 Notify RN    GLUCOSE, CAPILLARY     Status: Abnormal   Collection Time   11/02/11  4:07 PM      Component Value Range   Glucose-Capillary 129 (*) 70 - 99 mg/dL   Comment 1 Documented in Chart     Comment 2 Notify RN    GLUCOSE, CAPILLARY     Status: Abnormal   Collection Time   11/02/11  8:11 PM      Component Value Range   Glucose-Capillary 104 (*) 70 - 99 mg/dL  GLUCOSE, CAPILLARY     Status: Abnormal   Collection Time   11/03/11 12:01 AM      Component Value Range   Glucose-Capillary 118 (*) 70 - 99 mg/dL  CBC     Status: Abnormal   Collection Time   11/03/11  3:45 AM      Component Value Range   WBC 17.5 (*) 4.0 - 10.5 K/uL   RBC 2.60 (*) 4.22 - 5.81 MIL/uL   Hemoglobin 8.1 (*) 13.0 - 17.0 g/dL   HCT 16.1 (*) 09.6 - 04.5 %   MCV 91.9  78.0 - 100.0 fL   MCH 31.2  26.0 - 34.0 pg   MCHC 33.9  30.0 - 36.0 g/dL   RDW 40.9  81.1 - 91.4 %   Platelets 163  150 - 400 K/uL  COMPREHENSIVE METABOLIC PANEL     Status: Abnormal   Collection Time   11/03/11  3:45 AM      Component Value Range   Sodium 132 (*) 135 - 145 mEq/L   Potassium 4.9  3.5 - 5.1 mEq/L   Chloride 96  96 - 112 mEq/L   CO2 24  19 - 32 mEq/L   Glucose, Bld 112 (*) 70 - 99 mg/dL   BUN 47 (*) 6 - 23 mg/dL   Creatinine, Ser 7.82 (*) 0.50 - 1.35 mg/dL   Calcium 8.3 (*) 8.4 - 10.5 mg/dL   Total Protein 5.6 (*) 6.0 - 8.3 g/dL   Albumin 2.5 (*)  3.5 - 5.2 g/dL   AST 161 (*) 0 - 37 U/L   ALT 920 (*) 0 - 53 U/L   Alkaline Phosphatase 52  39 - 117 U/L   Total Bilirubin 1.0  0.3 - 1.2 mg/dL   GFR calc non Af Amer 21 (*) >90 mL/min   GFR calc Af Amer 25 (*) >90 mL/min  GLUCOSE, CAPILLARY     Status: Abnormal   Collection Time   11/03/11  3:51 AM      Component Value Range   Glucose-Capillary 104 (*) 70 - 99 mg/dL  GLUCOSE, CAPILLARY     Status: Abnormal   Collection Time   11/03/11  7:51 AM      Component Value Range   Glucose-Capillary 112 (*) 70 - 99 mg/dL   Comment 1 Notify RN      Studies/Results: @[id number] @  Medications: Reviewed.   Patient Active Hospital Problem List:  S/p repair Type A Aortic Dissection.  Remains in SR. Note plans for renal dose DA,  PRBC with post op ATN.  Follow I/O closely.  LOS: 5 days   Dietrich Pates 11/03/2011, 10:03 AM

## 2011-11-03 NOTE — Brief Op Note (Signed)
10/29/2011 - 11/03/2011  9:27 PM  PATIENT:  Gabriel Tran  56 y.o. male  PRE-OPERATIVE DIAGNOSIS:  cardiac tamponade  POST-OPERATIVE DIAGNOSIS:  cardiac tamponade  PROCEDURE:  Procedure(s) (LRB) with comments: EXPLORATION POST OPERATIVE OPEN HEART (N/A)  SURGEON:  Surgeon(s) and Role:    * Kerin Perna, MD - Primary  PHYSICIAN ASSISTANT:   ASSISTANTS: none   ANESTHESIA:   general  EBL:   70cc  BLOOD ADMINISTERED:0  DRAINS: VAC     SPECIMEN:  0  DISPOSITION OF SPECIMEN:  0  COUNTS:  correct  TOURNIQUET:  0  DICTATION: .done # [id number]  PLAN OF CARE: Admit to inpatient   PATIENT DISPOSITION:  ICU - intubated and hemodynamically stable.   Delay start of Pharmacological VTE aget tyes

## 2011-11-03 NOTE — Progress Notes (Signed)
ARF and hypotension possibly secondary to PE, VQ scan with intermediate probability and perfusion defects.   Discussed with Dr. Donata Clay. We will see the patient and assess for need for intubation; For hypotension I ordered a bolus of saline and albumin.

## 2011-11-03 NOTE — Progress Notes (Signed)
ANTICOAGULATION CONSULT NOTE - Initial Consult  Pharmacy Consult for Heparin Indication: pulmonary embolus  No Known Allergies  Patient Measurements: Height: 5\' 8"  (172.7 cm) Weight: 246 lb 7.6 oz (111.8 kg) (bed weight) IBW/kg (Calculated) : 68.4  Heparin Dosing Weight: 93kg  Vital Signs: Temp: 97.7 F (36.5 C) (09/28 1201) Temp src: Oral (09/28 1201) BP: 83/37 mmHg (09/28 1600) Pulse Rate: 79  (09/28 1600)  Labs:  Basename 11/03/11 0345 11/02/11 0400 11/01/11 1630 11/01/11 0414  HGB 8.1* 8.2* -- --  HCT 23.9* 24.8* -- 28.4*  PLT 163 124* -- 129*  APTT -- -- -- --  LABPROT -- -- -- --  INR -- -- -- --  HEPARINUNFRC -- -- -- --  CREATININE 3.09* 1.30 1.37* --  CKTOTAL -- -- -- --  CKMB -- -- -- --  TROPONINI -- -- -- --    Estimated Creatinine Clearance: 33.2 ml/min (by C-G formula based on Cr of 3.09).   Medical History: Past Medical History  Diagnosis Date  . Hypertension   . Hyperglycemia     a. A1C 5.9 in Sept 2013.  Marland Kitchen RBBB     Assessment: Patient is a 56 y.o M s/p AAA repair on 9/24.  V/Q scan today with "intermediate probability for PE" with heparin started at 3:30 PM today at 1000 unit/hr by Dr. Donata Clay.  OK with Dr. Donata Clay for pharmacy to assist with adjusting heparin therapy for patient.    Goal of Therapy:  Heparin level 0.3-0.5 units/ml (per Dr. Donata Clay) Monitor platelets by anticoagulation protocol: Yes   Plan:  1) Will increase heparin drip to 1200 unit/hr 2) check 8 hr heparin level   Melis Trochez P 11/03/2011,4:54 PM

## 2011-11-03 NOTE — Anesthesia Preprocedure Evaluation (Signed)
Anesthesia Evaluation  Patient identified by MRN, date of birth, ID band Patient unresponsive    Reviewed: Unable to perform ROS - Chart review only  Airway      Comment: Intubated Dental   Pulmonary  + rhonchi         Cardiovascular hypertension, Pt. on medications + dysrhythmias Rhythm:Regular Rate:Tachycardia     Neuro/Psych    GI/Hepatic   Endo/Other    Renal/GU      Musculoskeletal   Abdominal   Peds  Hematology   Anesthesia Other Findings   Reproductive/Obstetrics                           Anesthesia Physical Anesthesia Plan  ASA: IV and Emergent  Anesthesia Plan: General   Post-op Pain Management:    Induction: Intravenous  Airway Management Planned: Oral ETT  Additional Equipment: 3D TEE  Intra-op Plan:   Post-operative Plan: Post-operative intubation/ventilation  Informed Consent: I have reviewed the patients History and Physical, chart, labs and discussed the procedure including the risks, benefits and alternatives for the proposed anesthesia with the patient or authorized representative who has indicated his/her understanding and acceptance.     Plan Discussed with: CRNA, Anesthesiologist and Surgeon  Anesthesia Plan Comments:         Anesthesia Quick Evaluation

## 2011-11-03 NOTE — Consult Note (Signed)
Gabriel Tran 11/03/2011 Gabriel Tran D Requesting Physician:  Dr. Donata Clay  Reason for Consult:  Acute renal failure HPI: The patient is a 56 y.o. year-old with hx of HTN who presented with chest pain radiating to the neck.  BP was checked at 230/110 by local fire dept. He was given ASA and SL NTG with improvement en route by EMS. Admitted then on 9/23 by IM w dx of HTN'sive emergency and chest pain. Cardiology consulted and noted diffuse TWI the next day by EKG. He was still having CP and was taken for heart catheterization on 9/24. 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 That night pt underwent repair of the dissection with replacement of the ascending aorta, and reconstruction of the arch. Bypass time was 5 hours. He rec'd IV contrast with the heart cath and CT angio on 9/24. Creatinine on 9/26 was 1.29, on 9/27 1.30 and today 9/28 is up to 3.09.  UOP post op was good for about 24 hours then dropped to about 40-75 cc/hr the night of 9/26 and has progressively down since in spite of getting IV lasix periodically. BP's were good initially post op, did get pressors initially then came off of phenylephrine continuing on low dose dopamine. BP's today dipped into 80-90's for the first time. UOP today has been minimal. Today also pt's resp status was worsening and a V/Q scan showed intermediate probability for PE. Also lisinopril was stopped today.   ROS  some dyspnea, occ cp  no abd pain, no n/v  no focal weakness  Past Medical History:  Past Medical History  Diagnosis Date  . Hypertension   . Hyperglycemia     a. A1C 5.9 in Sept 2013.  Marland Kitchen RBBB     Past Surgical History:  Past Surgical History  Procedure Date  . Knee surgery     LEFT 1981  . Carpal tunnel release     2004 RIGHT   . Wisdom tooth extraction   . Thoracic aortic aneurysm repair 10/30/2011    Procedure: THORACIC ASCENDING ANEURYSM REPAIR (AAA);  Surgeon: Kerin Perna, MD;  Location: [facility] OR;  Service: Open Heart Surgery;  Laterality: N/A;  Ascending aortic dissection repair, arch reconstruction, aortic valve repair    Family History:  Family History  Problem Relation Age of Onset  . Parkinsonism Mother   . Leukemia Father     AML at 14  . Colon polyps Brother   . Kidney disease Brother   . Heart disease Brother     CABG at age 9.  . Prostate cancer Neg Hx   . Colon cancer Neg Hx   . Heart disease Father     Angioplasty in his 42s, CABG age 29.   Social History:  reports that he quit smoking about 21 years ago. His smoking use included Cigarettes. He does not have any smokeless tobacco history on file. He reports that he drinks alcohol. He reports that he does not use illicit drugs.  Allergies: No Known Allergies  Home medications: Prior to Admission medications   Medication Sig Start Date End Date Taking? Authorizing Provider  amLODipine (NORVASC) 5 MG tablet Take 1 tablet (5 mg total) by mouth daily. 09/06/11  Yes Joaquim Nam, MD  metoprolol (LOPRESSOR) 100 MG tablet Take 1 tablet (  100 mg total) by mouth 2 (two) times daily. 09/06/11  Yes Joaquim Nam, MD    Inpatient medications:    . albumin human  12.5 g Intravenous Once  . albumin human      . aminocaproic acid (AMICAR) for OHS   Intravenous To OR  . amiodarone (NEXTERONE PREMIX) 360 mg/200 mL dextrose  60 mg/hr Intravenous To OR  . bisacodyl  10 mg Oral Daily   Or  . bisacodyl  10 mg Rectal Daily  . calcium chloride  1 g Intravenous Once  . cefUROXime (ZINACEF)  IV  1.5 g Intravenous To OR  . cefUROXime (ZINACEF)  IV  750 mg Intravenous To OR  . dexmedetomidine  0.1-0.7 mcg/kg/hr Intravenous To OR  . docusate sodium  200 mg Oral Daily  . DOPamine  2-20 mcg/kg/min Intravenous To OR  . epinephrine  0.5-20 mcg/min  Intravenous To OR  . heparin-papaverine-plasmalyte irrigation   Irrigation To OR  . insulin aspart  0-24 Units Subcutaneous Q4H  . insulin (NOVOLIN-R) infusion   Intravenous To OR  . magnesium sulfate  40 mEq Other To OR  . morphine      . nitroGLYCERIN  2-200 mcg/min Intravenous To OR  . pantoprazole  40 mg Oral Q1200  . phenylephrine (NEO-SYNEPHRINE) Adult infusion  30-200 mcg/min Intravenous To OR  . potassium chloride  80 mEq Other To OR  . sodium bicarbonate  50 mEq Intravenous Once  . sodium chloride  3 mL Intravenous Q12H  . vancomycin  1,500 mg Intravenous To OR  . DISCONTD: acetaminophen (TYLENOL) oral liquid 160 mg/5 mL  975 mg Per Tube Q6H  . DISCONTD: acetaminophen  1,000 mg Oral Q6H  . DISCONTD: albumin human  50 g Intravenous Once  . DISCONTD: amLODipine  5 mg Oral Daily  . DISCONTD: aspirin EC  325 mg Oral Daily  . DISCONTD: insulin glargine  10 Units Subcutaneous QHS  . DISCONTD: lisinopril  10 mg Oral Daily  . DISCONTD: metoprolol tartrate  25 mg Per Tube BID  . DISCONTD: metoprolol tartrate  25 mg Oral BID  . DISCONTD: potassium chloride  20 mEq Oral BID  . DISCONTD: sodium chloride  500 mL Intravenous Once    Labs: Basic Metabolic Panel:  Lab 11/03/11 1610 11/03/11 0345 11/02/11 0400 11/01/11 1630 11/01/11 0414 10/31/11 1716 10/31/11 1700 10/31/11 0415 10/30/11 2320 10/30/11 0255 10/29/11 1601  NA 135 132* 138 140 142 146* -- 140 -- -- --  K 4.2 4.9 4.2 4.0 4.1 3.6 -- 4.4 -- -- --  CL -- 96 102 103 105 107 -- 108 -- 100 --  CO2 -- 24 30 29 30  -- -- 27 -- 28 29  GLUCOSE -- 112* 102* 153* 152* 185* -- 146* 162* -- --  BUN -- 47* 25* 22 18 14  -- 12 -- 13 --  CREATININE -- 3.09* 1.30 1.37* 1.29 1.20 1.17 1.07 -- -- --  ALB -- -- -- -- -- -- -- -- -- -- --  CALCIUM -- 8.3* 8.1* 8.0* 7.9* -- -- 7.7* -- 9.7 10.9*  PHOS -- -- -- -- -- -- -- -- -- -- --   Liver Function Tests:  Lab 11/03/11 0345 11/02/11 0400 10/30/11 0255  AST 948* 120* 18  ALT 920* 147* 28    ALKPHOS 52 33* 56  BILITOT 1.0 0.4 0.7  PROT 5.6* 5.7* 7.1  ALBUMIN 2.5* 2.7* 3.7   No results found for this basename: LIPASE:3,AMYLASE:3 in the last 168  hours No results found for this basename: AMMONIA:3 in the last 168 hours CBC:  Lab 11/03/11 1947 11/03/11 0345 11/02/11 0400 11/01/11 0414 10/31/11 1700 10/29/11 1601  WBC -- 17.5* 17.3* 21.5* 19.0* --  NEUTROABS -- -- -- -- -- 10.7*  HGB 7.5* 8.1* 8.2* 9.7* -- --  HCT 22.0* 23.9* 24.8* 28.4* -- --  MCV -- 91.9 90.5 90.7 90.1 --  PLT -- 163 124* 129* 124* --   PT/INR: @labrcntip (inr:5) Cardiac Enzymes:  Lab 10/30/11 0255 10/29/11 2117 10/29/11 1601  CKTOTAL -- -- --  CKMB -- -- --  CKMBINDEX -- -- --  TROPONINI <0.30 <0.30 <0.30   CBG:  Lab 11/03/11 1549 11/03/11 1201 11/03/11 0751 11/03/11 0351 11/03/11 0001  GLUCAP 118* 118* 112* 104* 118*    Iron Studies: No results found for this basename: IRON:30,TIBC:30,TRANSFERRIN:30,FERRITIN:30 in the last 168 hours  Xrays/Other Studies: Nm Pulmonary Perf And Vent  11/03/2011  *RADIOLOGY REPORT*  Clinical Data:  Rule out pulmonary embolism.  Shortness of breath.  NUCLEAR MEDICINE VENTILATION - PERFUSION LUNG SCAN  Technique:  Wash-in, equilibrium, and wash-out phase ventilation images were obtained using Xe-133 gas.  Perfusion images were obtained in multiple projections after intravenous injection of Tc- 38m MAA.  Radiopharmaceuticals:  10.0 mCi Xe-133 gas and 3.0 mCi Tc-47m MAA.  Comparison:  Plain film chest of 1 day prior.  Findings: Ventilation images are nondiagnostic.  Per technologist report, the patient could not hold his breath.  Perfusion images demonstrate a moderate sized defect involving the right inferior lateral hemithorax.  A probable separate moderate sized anterior defect within the right hemithorax on the lateral image.  IMPRESSION: Intermediate probability for pulmonary embolism.  The perfusion study demonstrates 1 and likely 2 moderate size defects.  However,  the ventilation study is nondiagnostic, secondary to technique related factors.   Original Report Authenticated By: Consuello Bossier, M.D.    Dg Chest Port 1 View  11/03/2011  *RADIOLOGY REPORT*  Clinical Data:  Status post open cardiac surgery  PORTABLE CHEST - 1 VIEW  Comparison: Prior chest x-ray yesterday, 11/02/2011  Findings: No significant interval change in the appearance of the chest.  Persistent dense left basilar opacity with obscuration of the diaphragm.  Likely small bilateral pleural effusions. Unchanged cardiomegaly.  Surgical changes include median sternotomy wires and surgical clips over the right chest suggesting thoracotomy.  Right IJ vascular sheath in unchanged position.  IMPRESSION: No significant interval change.  Persistent bilateral left greater than right pleural effusions and associated dense left basilar opacity likely reflecting a combination of pleural fluid with atelectasis.   Original Report Authenticated By: Alvino Blood Chest Port 1 View  11/02/2011  *RADIOLOGY REPORT*  Clinical Data:  Status post bursa.  Ascending thoracic aorta  PORTABLE CHEST - 1 VIEW  Comparison: Most recent prior chest x-ray 11/01/2011  Findings: Interval removal of mediastinal drain.  Right IJ vascular sheath remains in unchanged position with the tip at the confluence of the internal jugular and subclavian veins.  Status post right thoracotomy.  Unchanged cardiomegaly, and small bilateral pleural effusions.  Slightly increased aeration in the left base. Persistent left greater than right basilar opacities.  IMPRESSION:  1.  Interval removal of mediastinal drain. 2.  Slightly improved aeration in the left base may reflect improving atelectasis. 3.  Small bilateral pleural effusions and left worse than right basilar opacities likely reflecting a combination of pleural fluid and atelectasis.   Original Report Authenticated By: Vilma Prader  Physical Exam:  Blood pressure 97/58, pulse 81, temperature 97.7 F  (36.5 C), temperature source Axillary, resp. rate 17, height 5\' 8"  (1.727 m), weight 111.8 kg (246 lb 7.6 oz), SpO2 92.00%.  Gen: not in distress Skin: no rash, cyanosis HEENT:  EOMI, sclera anicteric, throat clear Neck: no JVD, no bruits or LAN Chest: clear bilat Heart: regular, no rub or gallop, no murmur Abdomen: soft, nontender, no HSM Ext: warm, + pulses, 1+ edema Neuro: alert, Ox3, no focal deficit  UA 9/28- no prot, 7-10 rbc, no wbc ABG- 7.30/36/53 Hb- 7.5   Bun/Cr- 47/3.09   CO2- 24  Impression 1. Acute kidney injury- this is likely ATN from dye exposure x 2 and hypoperfusion during TAA repair. Dissection of renal arteries is another possibility but less likely.  Pt is oliguric, mild-mod acidosis, Na/K are wnl. If continues to progress may require RRT.   2. 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 Met acidosis due to AKI most likely, mild-mod 4. Possible PE, by V/Q scan today  Rec- UNa, Ucreat, cont supportive care, will follow.  RRT as indicated, not required yet. Will follow.    Vinson Moselle  MD [facility] [phone] pgr    [phone] cell 11/03/2011, 8:35 PM

## 2011-11-04 ENCOUNTER — Encounter (HOSPITAL_COMMUNITY): Payer: Self-pay | Admitting: Anesthesiology

## 2011-11-04 ENCOUNTER — Inpatient Hospital Stay (HOSPITAL_COMMUNITY): Payer: PRIVATE HEALTH INSURANCE

## 2011-11-04 LAB — GLUCOSE, CAPILLARY
Glucose-Capillary: 131 mg/dL — ABNORMAL HIGH (ref 70–99)
Glucose-Capillary: 103 mg/dL — ABNORMAL HIGH (ref 70–99)
Glucose-Capillary: 119 mg/dL — ABNORMAL HIGH (ref 70–99)

## 2011-11-04 LAB — POCT I-STAT 3, ART BLOOD GAS (G3+)
Bicarbonate: 23.8 mEq/L (ref 20.0–24.0)
O2 Saturation: 95 %
Patient temperature: 36.1
TCO2: 24 mmol/L (ref 0–100)
TCO2: 25 mmol/L (ref 0–100)
pCO2 arterial: 46 mmHg — ABNORMAL HIGH (ref 35.0–45.0)
pCO2 arterial: 47.1 mmHg — ABNORMAL HIGH (ref 35.0–45.0)
pH, Arterial: 7.289 — ABNORMAL LOW (ref 7.350–7.450)
pH, Arterial: 7.312 — ABNORMAL LOW (ref 7.350–7.450)
pO2, Arterial: 85 mmHg (ref 80.0–100.0)

## 2011-11-04 LAB — APTT
aPTT: 41 seconds — ABNORMAL HIGH (ref 24–37)
aPTT: 98 seconds — ABNORMAL HIGH (ref 24–37)
aPTT: 84 seconds — ABNORMAL HIGH (ref 24–37)

## 2011-11-04 LAB — COMPREHENSIVE METABOLIC PANEL
ALT: 2643 U/L — ABNORMAL HIGH (ref 0–53)
AST: 3129 U/L — ABNORMAL HIGH (ref 0–37)
Albumin: 2.6 g/dL — ABNORMAL LOW (ref 3.5–5.2)
Alkaline Phosphatase: 54 U/L (ref 39–117)
BUN: 73 mg/dL — ABNORMAL HIGH (ref 6–23)
CO2: 21 mEq/L (ref 19–32)
Calcium: 8 mg/dL — ABNORMAL LOW (ref 8.4–10.5)
Chloride: 95 mEq/L — ABNORMAL LOW (ref 96–112)
Creatinine, Ser: 4.48 mg/dL — ABNORMAL HIGH (ref 0.50–1.35)
GFR calc Af Amer: 16 mL/min — ABNORMAL LOW (ref >90)
GFR calc non Af Amer: 14 mL/min — ABNORMAL LOW (ref >90)
Glucose, Bld: 162 mg/dL — ABNORMAL HIGH (ref 70–99)
Potassium: 5 mEq/L (ref 3.5–5.1)
Sodium: 136 mEq/L (ref 135–145)
Total Bilirubin: 1.7 mg/dL — ABNORMAL HIGH (ref 0.3–1.2)
Total Protein: 4.9 g/dL — ABNORMAL LOW (ref 6.0–8.3)

## 2011-11-04 LAB — CBC
HCT: 25.5 % — ABNORMAL LOW (ref 39.0–52.0)
Hemoglobin: 8.8 g/dL — ABNORMAL LOW (ref 13.0–17.0)
Hemoglobin: 8 g/dL — ABNORMAL LOW (ref 13.0–17.0)
MCH: 30.3 pg (ref 26.0–34.0)
MCHC: 34.5 g/dL (ref 30.0–36.0)
MCHC: 35.2 g/dL (ref 30.0–36.0)
MCV: 87.9 fL (ref 78.0–100.0)
Platelets: 97 10*3/uL — ABNORMAL LOW (ref 150–400)
Platelets: 78 10*3/uL — ABNORMAL LOW (ref 150–400)
RBC: 2.9 MIL/uL — ABNORMAL LOW (ref 4.22–5.81)
RBC: 2.62 MIL/uL — ABNORMAL LOW (ref 4.22–5.81)
RDW: 13.5 % (ref 11.5–15.5)
WBC: 9.8 10*3/uL (ref 4.0–10.5)

## 2011-11-04 LAB — POCT I-STAT, CHEM 8
BUN: 94 mg/dL — ABNORMAL HIGH (ref 6–23)
Creatinine, Ser: 4.8 mg/dL — ABNORMAL HIGH (ref 0.50–1.35)
Glucose, Bld: 135 mg/dL — ABNORMAL HIGH (ref 70–99)
Hemoglobin: 7.8 g/dL — ABNORMAL LOW (ref 13.0–17.0)
Potassium: 4.4 mEq/L (ref 3.5–5.1)
TCO2: 25 mmol/L (ref 0–100)

## 2011-11-04 LAB — CARBOXYHEMOGLOBIN
Carboxyhemoglobin: 0.7 % (ref 0.5–1.5)
Methemoglobin: 1.3 % (ref 0.0–1.5)
O2 Saturation: 59.3 %
Total hemoglobin: 9.3 g/dL — ABNORMAL LOW (ref 13.5–18.0)

## 2011-11-04 LAB — PROTIME-INR
INR: 2.59 — ABNORMAL HIGH (ref 0.00–1.49)
Prothrombin Time: 26.5 seconds — ABNORMAL HIGH (ref 11.6–15.2)

## 2011-11-04 LAB — BASIC METABOLIC PANEL
BUN: 90 mg/dL — ABNORMAL HIGH (ref 6–23)
CO2: 25 mEq/L (ref 19–32)
Calcium: 7.7 mg/dL — ABNORMAL LOW (ref 8.4–10.5)
Chloride: 94 mEq/L — ABNORMAL LOW (ref 96–112)
Creatinine, Ser: 5.4 mg/dL — ABNORMAL HIGH (ref 0.50–1.35)
GFR calc Af Amer: 13 mL/min — ABNORMAL LOW (ref >90)
GFR calc non Af Amer: 11 mL/min — ABNORMAL LOW (ref >90)
Glucose, Bld: 134 mg/dL — ABNORMAL HIGH (ref 70–99)
Potassium: 4.3 mEq/L (ref 3.5–5.1)
Sodium: 134 mEq/L — ABNORMAL LOW (ref 135–145)

## 2011-11-04 LAB — MAGNESIUM: Magnesium: 3.5 mg/dL — ABNORMAL HIGH (ref 1.5–2.5)

## 2011-11-04 MED ORDER — VANCOMYCIN HCL IN DEXTROSE 1-5 GM/200ML-% IV SOLN
1000.0000 mg | INTRAVENOUS | Status: DC
  Administered 2011-11-04: 1000 mg via INTRAVENOUS
  Filled 2011-11-04: qty 200

## 2011-11-04 MED ORDER — DEXMEDETOMIDINE HCL IN NACL 400 MCG/100ML IV SOLN
0.8000 ug/kg/h | INTRAVENOUS | Status: DC
  Administered 2011-11-04 (×4): 0.7 ug/kg/h via INTRAVENOUS
  Administered 2011-11-05: 0.8 ug/kg/h via INTRAVENOUS
  Administered 2011-11-05: 0.7 ug/kg/h via INTRAVENOUS
  Administered 2011-11-05 – 2011-11-08 (×12): 0.8 ug/kg/h via INTRAVENOUS
  Filled 2011-11-04 (×21): qty 100

## 2011-11-04 MED ORDER — EPINEPHRINE HCL 1 MG/ML IJ SOLN
0.5000 ug/min | INTRAVENOUS | Status: DC
  Administered 2011-11-06: 1 ug/min via INTRAVENOUS
  Filled 2011-11-04 (×2): qty 4

## 2011-11-04 MED ORDER — FENTANYL BOLUS VIA INFUSION
50.0000 ug | Freq: Four times a day (QID) | INTRAVENOUS | Status: DC | PRN
  Filled 2011-11-04: qty 100

## 2011-11-04 MED ORDER — CHLORHEXIDINE GLUCONATE 0.12 % MT SOLN
OROMUCOSAL | Status: AC
  Administered 2011-11-04: 15 mL via OROMUCOSAL
  Filled 2011-11-04: qty 15

## 2011-11-04 MED ORDER — CHLORHEXIDINE GLUCONATE 0.12 % MT SOLN
15.0000 mL | Freq: Two times a day (BID) | OROMUCOSAL | Status: DC
  Administered 2011-11-04 – 2011-11-11 (×14): 15 mL via OROMUCOSAL
  Filled 2011-11-04 (×13): qty 15

## 2011-11-04 MED ORDER — SODIUM CHLORIDE 0.9 % IV SOLN
0.0500 mg/kg/h | INTRAVENOUS | Status: DC
  Filled 2011-11-04: qty 250

## 2011-11-04 MED ORDER — SODIUM CHLORIDE 0.9 % IJ SOLN
10.0000 mL | Freq: Two times a day (BID) | INTRAMUSCULAR | Status: DC
  Administered 2011-11-04 (×2): 10 mL
  Administered 2011-11-05: 20 mL
  Administered 2011-11-05 – 2011-11-07 (×2): 40 mL
  Administered 2011-11-07 – 2011-11-08 (×3): 10 mL
  Administered 2011-11-09: 40 mL
  Administered 2011-11-09 – 2011-11-10 (×2): 10 mL
  Administered 2011-11-10: 20 mL
  Administered 2011-11-11: 30 mL
  Administered 2011-11-11 – 2011-11-12 (×3): 10 mL
  Administered 2011-11-13 – 2011-11-14 (×3): 30 mL
  Administered 2011-11-14 – 2011-11-15 (×2): 10 mL

## 2011-11-04 MED ORDER — AMIODARONE HCL IN DEXTROSE 360-4.14 MG/200ML-% IV SOLN
30.0000 mg/h | INTRAVENOUS | Status: DC
  Administered 2011-11-04 (×2): 30 mg/h via INTRAVENOUS
  Administered 2011-11-05 – 2011-11-08 (×5): 15 mg/h via INTRAVENOUS
  Administered 2011-11-09 – 2011-11-12 (×7): 30 mg/h via INTRAVENOUS
  Filled 2011-11-04 (×29): qty 200

## 2011-11-04 MED ORDER — BIVALIRUDIN 250 MG IV SOLR
0.1000 mg/kg/h | INTRAVENOUS | Status: DC
  Administered 2011-11-04: 0.1 mg/kg/h via INTRAVENOUS
  Filled 2011-11-04 (×2): qty 250

## 2011-11-04 MED ORDER — SODIUM CHLORIDE 0.9 % IV SOLN
0.0150 mg/kg/h | INTRAVENOUS | Status: DC
  Administered 2011-11-04: 0.025 mg/kg/h via INTRAVENOUS
  Administered 2011-11-05: 0.015 mg/kg/h via INTRAVENOUS
  Filled 2011-11-04 (×2): qty 250

## 2011-11-04 MED ORDER — BIOTENE DRY MOUTH MT LIQD
15.0000 mL | Freq: Four times a day (QID) | OROMUCOSAL | Status: DC
  Administered 2011-11-04 – 2011-11-12 (×28): 15 mL via OROMUCOSAL

## 2011-11-04 MED ORDER — SODIUM CHLORIDE 0.9 % IV SOLN
50.0000 ug/h | INTRAVENOUS | Status: DC
  Administered 2011-11-04: 50 ug/h via INTRAVENOUS
  Administered 2011-11-05: 100 ug/h via INTRAVENOUS
  Filled 2011-11-04 (×2): qty 50

## 2011-11-04 MED ORDER — SODIUM CHLORIDE 0.9 % IJ SOLN
10.0000 mL | INTRAMUSCULAR | Status: DC | PRN
  Administered 2011-11-04: 10 mL

## 2011-11-04 MED ORDER — DEXTROSE 5 % IV SOLN
1.0000 g | Freq: Two times a day (BID) | INTRAVENOUS | Status: DC
  Administered 2011-11-04 – 2011-11-05 (×3): 1 g via INTRAVENOUS
  Filled 2011-11-04 (×4): qty 1

## 2011-11-04 NOTE — Progress Notes (Signed)
Dr Donata Clay called, updated on Pt status, vitals, hemodynamics and lab results; orders received to start epinephrine drip @ 84mcg/min and cont' to monitor.

## 2011-11-04 NOTE — Progress Notes (Signed)
ANTICOAGULATION CONSULT NOTE - Initial Consult  Pharmacy Consult for Bivalirudin Indication: pulmonary embolus and rule-out HIT  No Known Allergies  Patient Measurements: Height: 5\' 8"  (172.7 cm) Weight: 261 lb 11 oz (118.7 kg) IBW/kg (Calculated) : 68.4   Vital Signs: Temp: 99.7 F (37.6 C) (09/29 1245) Temp src: Core (Comment) (09/29 1200) BP: 140/75 mmHg (09/29 1236) Pulse Rate: 75  (09/29 1245)  Labs:  Basename 11/04/11 1300 11/04/11 0330 11/03/11 2200 11/03/11 2157 11/03/11 1600 11/03/11 0345  HGB -- 8.8* 7.4* -- -- --  HCT -- 25.5* 21.6* 21.0* -- --  PLT -- 97* 95* -- -- 163  APTT 98* 41* 41* -- -- --  LABPROT -- 26.5* 26.5* -- 21.0* --  INR -- 2.59* 2.59* -- 1.89* --  HEPARINUNFRC -- -- -- -- -- --  CREATININE -- 4.48* -- 4.10* -- 3.09*  CKTOTAL -- -- -- -- -- --  CKMB -- -- -- -- -- --  TROPONINI -- -- -- -- -- --    Estimated Creatinine Clearance: 23.6 ml/min (by C-G formula based on Cr of 4.48).   Medical History: Past Medical History  Diagnosis Date  . Hypertension   . Hyperglycemia     a. A1C 5.9 in Sept 2013.  Marland Kitchen RBBB     Medications:  Infusions:    . sodium chloride 20 mL/hr (11/03/11 2358)  . sodium chloride 20 mL/hr at 11/04/11 0700  . sodium chloride 20 mL/hr at 11/01/11 1142  . sodium chloride    . sodium chloride 20 mL/hr at 11/04/11 0800  . sodium chloride    . amiodarone (NEXTERONE PREMIX) 360 mg/200 mL dextrose 30 mg/hr (11/04/11 0811)  . bivalirudin (ANGIOMAX) infusion 5 mg/mL (Cath Lab,ACS,PCI indication)    . dexmedetomidine 0.7 mcg/kg/hr (11/04/11 1000)  . DOBUTamine    . DOPamine 5 mcg/kg/min (11/04/11 0800)  . epinephrine 2 mcg/min (11/04/11 0800)  . lactated ringers Stopped (11/04/11 0800)  . nitroGLYCERIN    . phenylephrine (NEO-SYNEPHRINE) Adult infusion Stopped (11/04/11 1230)  . DISCONTD: bivalirudin (ANGIOMAX) infusion 5 mg/mL (Cath Lab,ACS,PCI indication) 0.1 mg/kg/hr (11/04/11 1100)  . DISCONTD: dexmedetomidine 0.7  mcg/kg/hr (11/04/11 0300)  . DISCONTD: heparin 1,200 Units/hr (11/03/11 1700)  . DISCONTD: insulin (NOVOLIN-R) infusion    . DISCONTD: phenylephrine (NEO-SYNEPHRINE) Adult infusion 15 mcg/min (11/04/11 0015)    Assessment: Pulmonary Embolus, rule-out HIT:  Patient was initiated on Bivalirudin at a rate of 0.1 mg/kg/hr for pulmonary embolus and rule-out HIT.  He is at a high risk of bleeding complications due to recent surgeries, cardiac tamponade, and renal insufficiency.  I spoke with Dr. Donata Clay to clarify dosing and goals of therapy. His initial PTT is 98 seconds, which is above the adjusted goal range.  Goal of Therapy:  aPTT 50-55 seconds Monitor platelets by anticoagulation protocol: Yes   Plan:  Hold Bivalirudin x 1 hour, then restart at a lower rate of 0.5 mg/kg/hr. Recheck PTT 4 hours after restarting Bivalirudin.  Estella Husk, Pharm.D., BCPS Clinical Pharmacist  Phone [phone] Pager [phone] 11/04/2011, 1:56 PM

## 2011-11-04 NOTE — Progress Notes (Signed)
ANTIBIOTIC CONSULT NOTE - INITIAL  Pharmacy Consult for Vancomycin Indication: s/p open sternotomy in ICU  No Known Allergies  Patient Measurements: Height: 5\' 8"  (172.7 cm) Weight: 261 lb 11 oz (118.7 kg) IBW/kg (Calculated) : 68.4   Vital Signs: Temp: 99.9 F (37.7 C) (09/29 1905) Temp src: Core (Comment) (09/29 1600) BP: 107/63 mmHg (09/29 1900) Pulse Rate: 80  (09/29 1905) Intake/Output from previous day: 09/28 0701 - 09/29 0700 In: 6659.5 [I.V.:5398.7; Blood:680.8; NG/GT:60; IV Piggyback:520] Out: 995 [Urine:320; Emesis/NG output:250; Drains:225; Blood:200] Intake/Output from this shift:    Labs:  Basename 11/04/11 1820 11/04/11 1634 11/04/11 0330 11/03/11 2217 11/03/11 2200 11/03/11 2157  WBC 10.0 -- 9.8 -- 13.8* --  HGB 8.0* 7.8* 8.8* -- -- --  PLT 78* -- 97* -- 95* --  LABCREA -- -- -- 200.05 -- --  CREATININE -- 4.80* 4.48* -- -- 4.10*   Estimated Creatinine Clearance: 22 ml/min (by C-G formula based on Cr of 4.8). No results found for this basename: VANCOTROUGH:2,VANCOPEAK:2,VANCORANDOM:2,GENTTROUGH:2,GENTPEAK:2,GENTRANDOM:2,TOBRATROUGH:2,TOBRAPEAK:2,TOBRARND:2,AMIKACINPEAK:2,AMIKACINTROU:2,AMIKACIN:2, in the last 72 hours   Microbiology: Recent Results (from the past 720 hour(s))  MRSA PCR SCREENING     Status: Normal   Collection Time   10/30/11 11:16 PM      Component Value Range Status Comment   MRSA by PCR NEGATIVE  NEGATIVE Final     Medical History: Past Medical History  Diagnosis Date  . Hypertension   . Hyperglycemia     a. A1C 5.9 in Sept 2013.  Marland Kitchen RBBB     Medications:  Anti-infectives     Start     Dose/Rate Route Frequency Ordered Stop   11/04/11 1900   vancomycin (VANCOCIN) IVPB 1000 mg/200 mL premix  Status:  Discontinued        1,000 mg 200 mL/hr over 60 Minutes Intravenous Every 24 hours 11/03/11 2116 11/04/11 1820   11/04/11 1900   vancomycin (VANCOCIN) IVPB 1000 mg/200 mL premix     Comments: Pharmacy to monitor vancomycin  levels and adjust dosing please      1,000 mg 200 mL/hr over 60 Minutes Intravenous Every 24 hours 11/04/11 1820 11/08/11 1859   11/04/11 1200   cefTAZidime (FORTAZ) 1 g in dextrose 5 % 50 mL IVPB        1 g 100 mL/hr over 30 Minutes Intravenous Every 12 hours 11/04/11 1055     11/04/11 0800   cefUROXime (ZINACEF) 1.5 g in dextrose 5 % 50 mL IVPB  Status:  Discontinued        1.5 g 100 mL/hr over 30 Minutes Intravenous Every 12 hours 11/03/11 2207 11/04/11 1400   11/04/11 0445   vancomycin (VANCOCIN) IVPB 1000 mg/200 mL premix  Status:  Discontinued        1,000 mg 200 mL/hr over 60 Minutes Intravenous  Once 11/03/11 2207 11/03/11 2209   11/04/11 0400   vancomycin (VANCOCIN) 1,500 mg in sodium chloride 0.9 % 250 mL IVPB        1,500 mg 125 mL/hr over 120 Minutes Intravenous To Surgery 11/03/11 1923 11/03/11 1950   11/04/11 0400   cefUROXime (ZINACEF) 1.5 g in dextrose 5 % 50 mL IVPB        1.5 g 100 mL/hr over 30 Minutes Intravenous To Surgery 11/03/11 1923 11/03/11 1950   11/04/11 0400   cefUROXime (ZINACEF) 750 mg in dextrose 5 % 50 mL IVPB  Status:  Discontinued        750 mg 100 mL/hr over 30 Minutes Intravenous  To Surgery 11/03/11 1913 11/04/11 0321   11/03/11 2031   vancomycin (VANCOCIN) powder  Status:  Discontinued          As needed 11/03/11 2032 11/03/11 2116   10/31/11 0600   vancomycin (VANCOCIN) IVPB 1000 mg/200 mL premix     Comments: 48 hour postop coverage emerg dissection with Ao valve repair      1,000 mg 200 mL/hr over 60 Minutes Intravenous Every 12 hours 10/30/11 2335 11/01/11 0708   10/31/11 0500   vancomycin (VANCOCIN) IVPB 1000 mg/200 mL premix  Status:  Discontinued        1,000 mg 200 mL/hr over 60 Minutes Intravenous  Once 10/30/11 2319 10/30/11 2335   10/31/11 0400   vancomycin (VANCOCIN) 1,250 mg in sodium chloride 0.9 % 250 mL IVPB        1,250 mg 166.7 mL/hr over 90 Minutes Intravenous To Surgery 10/30/11 1156 10/30/11 1325   10/31/11 0400    cefUROXime (ZINACEF) 1.5 g in dextrose 5 % 50 mL IVPB        1.5 g 100 mL/hr over 30 Minutes Intravenous To Surgery 10/30/11 1156 10/30/11 2055   10/31/11 0400   cefUROXime (ZINACEF) 750 mg in dextrose 5 % 50 mL IVPB  Status:  Discontinued        750 mg 100 mL/hr over 30 Minutes Intravenous To Surgery 10/30/11 1154 10/30/11 1345   10/30/11 2330   cefUROXime (ZINACEF) 1.5 g in dextrose 5 % 50 mL IVPB        1.5 g 100 mL/hr over 30 Minutes Intravenous Every 12 hours 10/30/11 2319 11/01/11 1212   10/30/11 1515   vancomycin (VANCOCIN) powder 1,000 mg  Status:  Discontinued        1,000 mg Other To Surgery 10/30/11 1509 10/30/11 2258         Assessment: Patient is s/p sternotomy in ICU on 9/28 and is requiring prolonged antimicrobial prophylaxis.  Request received to adjust Vancomycin dosing and monitor levels.  Based on pharmacokinetic estimates his current Vancomycin regimen is estimated to yield a therapeutic trough.  He is not at steady state at this point so Vancomycin trough monitoring is not warranted at this time.  Goal of Therapy:  Vancomycin trough level 15-20 mcg/ml  Plan:  Continue Vancomycin 1gm IV q24h x 4 days (last dose on 10/2). Will monitor renal function and urine output Will check Vancomycin trough if length of therapy is estimated to be >5 days or if renal function warrants.  Estella Husk, Pharm.D., BCPS Clinical Pharmacist  Phone [phone] Pager [phone] 11/04/2011, 7:22 PM

## 2011-11-04 NOTE — Progress Notes (Signed)
ANTICOAGULATION CONSULT NOTE - Initial Consult  Pharmacy Consult for Bivalirudin Indication: pulmonary embolus and rule-out HIT  No Known Allergies  Patient Measurements: Height: 5\' 8"  (172.7 cm) Weight: 261 lb 11 oz (118.7 kg) IBW/kg (Calculated) : 68.4   Vital Signs: Temp: 99.9 F (37.7 C) (09/29 1900) Temp src: Core (Comment) (09/29 1600) BP: 107/63 mmHg (09/29 1900) Pulse Rate: 78  (09/29 1900)  Labs:  Basename 11/04/11 1820 11/04/11 1634 11/04/11 1300 11/04/11 0330 11/03/11 2200 11/03/11 2157 11/03/11 1600  HGB 8.0* 7.8* -- -- -- -- --  HCT 22.7* 23.0* -- 25.5* -- -- --  PLT 78* -- -- 97* 95* -- --  APTT 84* -- 98* 41* -- -- --  LABPROT -- -- -- 26.5* 26.5* -- 21.0*  INR -- -- -- 2.59* 2.59* -- 1.89*  HEPARINUNFRC -- -- -- -- -- -- --  CREATININE -- 4.80* -- 4.48* -- 4.10* --  CKTOTAL -- -- -- -- -- -- --  CKMB -- -- -- -- -- -- --  TROPONINI -- -- -- -- -- -- --    Estimated Creatinine Clearance: 22 ml/min (by C-G formula based on Cr of 4.8).   Medical History: Past Medical History  Diagnosis Date  . Hypertension   . Hyperglycemia     a. A1C 5.9 in Sept 2013.  Marland Kitchen RBBB     Medications:  Infusions:     . sodium chloride 20 mL/hr (11/03/11 2358)  . sodium chloride 20 mL/hr at 11/04/11 0700  . sodium chloride 20 mL/hr at 11/01/11 1142  . sodium chloride    . sodium chloride 20 mL/hr at 11/04/11 0800  . sodium chloride    . amiodarone (NEXTERONE PREMIX) 360 mg/200 mL dextrose 15 mg/hr (11/04/11 1830)  . bivalirudin (ANGIOMAX) infusion 0.5 mg/mL (Non-ACS indications)    . dexmedetomidine 0.7 mcg/kg/hr (11/04/11 1534)  . DOPamine 3 mcg/kg/min (11/04/11 1500)  . epinephrine 1 mcg/min (11/04/11 1830)  . nitroGLYCERIN    . phenylephrine (NEO-SYNEPHRINE) Adult infusion Stopped (11/04/11 1230)  . DISCONTD: bivalirudin (ANGIOMAX) infusion 5 mg/mL (Cath Lab,ACS,PCI indication) Stopped (11/04/11 1330)  . DISCONTD: bivalirudin (ANGIOMAX) infusion 5 mg/mL (Cath  Lab,ACS,PCI indication) Stopped (11/04/11 1915)  . DISCONTD: dexmedetomidine 0.7 mcg/kg/hr (11/04/11 0300)  . DISCONTD: DOBUTamine    . DISCONTD: heparin 1,200 Units/hr (11/03/11 1700)  . DISCONTD: insulin (NOVOLIN-R) infusion    . DISCONTD: lactated ringers Stopped (11/04/11 0800)  . DISCONTD: phenylephrine (NEO-SYNEPHRINE) Adult infusion 15 mcg/min (11/04/11 0015)    Assessment: Pulmonary Embolus, rule-out HIT:  Patient was initiated on Bivalirudin at a rate of 0.1 mg/kg/hr for pulmonary embolus and rule-out HIT.  He is at a high risk of bleeding complications due to recent surgeries, cardiac tamponade, and renal insufficiency.  I spoke with Dr. Donata Clay to clarify dosing and goals of therapy. His PTT is 84 seconds on a reduced-dose of Bivalirudin, which is above the desired adjusted goal range.  Goal of Therapy:  aPTT 50-55 seconds Monitor platelets by anticoagulation protocol: Yes   Plan:  Hold Bivalirudin x 1 hour, then restart at a lower rate of 0.025 mg/kg/hr. Recheck PTT 4 hours after restarting Bivalirudin.  Estella Husk, Pharm.D., BCPS Clinical Pharmacist  Phone [phone] Pager [phone] 11/04/2011, 7:14 PM

## 2011-11-04 NOTE — Progress Notes (Signed)
Subjective: Patient intubated, sedated. Objective: Filed Vitals:   11/04/11 0830 11/04/11 0845 11/04/11 0900 11/04/11 0915  BP:   113/64   Pulse: 81 80 80 80  Temp: 100.2 F (37.9 C) 100.4 F (38 C) 100.4 F (38 C) 100.6 F (38.1 C)  TempSrc:      Resp: 15 12 15 15   Height:      Weight:      SpO2: 96% 97% 96% 97%   Weight change:   Intake/Output Summary (Last 24 hours) at 11/04/11 1031 Last data filed at 11/04/11 0811  Gross per 24 hour  Intake 6833.65 ml  Output   1110 ml  Net 5723.65 ml    General: INtubated, sedated. Neck:  Full Heart: Regular rate and rhythm, without murmurs, rubs, gallops.  Lungs: Clear anteriorly. Exemities:  No edema.     Tele:  Currently SR/ST.   Lab Results: Results for orders placed during the hospital encounter of 10/29/11 (from the past 24 hour(s))  POCT I-STAT 3, BLOOD GAS (G3+)     Status: Abnormal   Collection Time   11/03/11 10:54 AM      Component Value Range   pH, Arterial 7.350  7.350 - 7.450   pCO2 arterial 37.8  35.0 - 45.0 mmHg   pO2, Arterial 58.0 (*) 80.0 - 100.0 mmHg   Bicarbonate 20.9  20.0 - 24.0 mEq/L   TCO2 22  0 - 100 mmol/L   O2 Saturation 88.0     Acid-base deficit 4.0 (*) 0.0 - 2.0 mmol/L   Patient temperature 37.0 C     Collection site RADIAL, ALLEN'S TEST ACCEPTABLE     Drawn by RT     Sample type ARTERIAL    URINALYSIS, ROUTINE W REFLEX MICROSCOPIC     Status: Abnormal   Collection Time   11/03/11 11:20 AM      Component Value Range   Color, Urine AMBER (*) YELLOW   APPearance TURBID (*) CLEAR   Specific Gravity, Urine 1.025  1.005 - 1.030   pH 5.0  5.0 - 8.0   Glucose, UA NEGATIVE  NEGATIVE mg/dL   Hgb urine dipstick SMALL (*) NEGATIVE   Bilirubin Urine SMALL (*) NEGATIVE   Ketones, ur 15 (*) NEGATIVE mg/dL   Protein, ur NEGATIVE  NEGATIVE mg/dL   Urobilinogen, UA 1.0  0.0 - 1.0 mg/dL   Nitrite NEGATIVE  NEGATIVE   Leukocytes, UA NEGATIVE  NEGATIVE  URINE MICROSCOPIC-ADD ON     Status: Normal   Collection Time   11/03/11 11:20 AM      Component Value Range   RBC / HPF 7-10  <3 RBC/hpf   Urine-Other MUCOUS PRESENT    POCT I-STAT 3, BLOOD GAS (G3+)     Status: Abnormal   Collection Time   11/03/11 11:48 AM      Component Value Range   pH, Arterial 7.342 (*) 7.350 - 7.450   pCO2 arterial 38.5  35.0 - 45.0 mmHg   pO2, Arterial 59.0 (*) 80.0 - 100.0 mmHg   Bicarbonate 20.9  20.0 - 24.0 mEq/L   TCO2 22  0 - 100 mmol/L   O2 Saturation 89.0     Acid-base deficit 4.0 (*) 0.0 - 2.0 mmol/L   Patient temperature 37.0 C     Collection site RADIAL, ALLEN'S TEST ACCEPTABLE     Drawn by RT     Sample type ARTERIAL    GLUCOSE, CAPILLARY     Status: Abnormal   Collection Time  11/03/11 12:01 PM      Component Value Range   Glucose-Capillary 118 (*) 70 - 99 mg/dL   Comment 1 Notify RN    GLUCOSE, CAPILLARY     Status: Abnormal   Collection Time   11/03/11  3:49 PM      Component Value Range   Glucose-Capillary 118 (*) 70 - 99 mg/dL   Comment 1 Notify RN    PROTIME-INR     Status: Abnormal   Collection Time   11/03/11  4:00 PM      Component Value Range   Prothrombin Time 21.0 (*) 11.6 - 15.2 seconds   INR 1.89 (*) 0.00 - 1.49  APTT     Status: Normal   Collection Time   11/03/11  4:00 PM      Component Value Range   aPTT 33  24 - 37 seconds  POCT I-STAT 3, BLOOD GAS (G3+)     Status: Abnormal   Collection Time   11/03/11  4:43 PM      Component Value Range   pH, Arterial 7.300 (*) 7.350 - 7.450   pCO2 arterial 36.5  35.0 - 45.0 mmHg   pO2, Arterial 53.0 (*) 80.0 - 100.0 mmHg   Bicarbonate 17.9 (*) 20.0 - 24.0 mEq/L   TCO2 19  0 - 100 mmol/L   O2 Saturation 84.0     Acid-base deficit 8.0 (*) 0.0 - 2.0 mmol/L   Patient temperature 37.0 C     Collection site RADIAL, ALLEN'S TEST ACCEPTABLE     Drawn by RT     Sample type ARTERIAL    CARBOXYHEMOGLOBIN     Status: Abnormal   Collection Time   11/03/11  4:48 PM      Component Value Range   Total hemoglobin 8.7 (*) 13.5 - 18.0  g/dL   O2 Saturation 16.1     Carboxyhemoglobin 1.2  0.5 - 1.5 %   Methemoglobin 1.3  0.0 - 1.5 %  POCT I-STAT 7, (LYTES, BLD GAS, ICA,H+H)     Status: Abnormal   Collection Time   11/03/11  7:47 PM      Component Value Range   pH, Arterial 7.014 (*) 7.350 - 7.450   pCO2 arterial 52.5 (*) 35.0 - 45.0 mmHg   pO2, Arterial 73.0 (*) 80.0 - 100.0 mmHg   Bicarbonate 13.4 (*) 20.0 - 24.0 mEq/L   TCO2 15  0 - 100 mmol/L   O2 Saturation 85.0     Acid-base deficit 17.0 (*) 0.0 - 2.0 mmol/L   Sodium 135  135 - 145 mEq/L   Potassium 4.2  3.5 - 5.1 mEq/L   Calcium, Ion 1.17  1.12 - 1.23 mmol/L   HCT 22.0 (*) 39.0 - 52.0 %   Hemoglobin 7.5 (*) 13.0 - 17.0 g/dL   Sample type ARTERIAL    POCT I-STAT 3, BLOOD GAS (G3+)     Status: Abnormal   Collection Time   11/03/11  9:55 PM      Component Value Range   pH, Arterial 7.262 (*) 7.350 - 7.450   pCO2 arterial 41.9  35.0 - 45.0 mmHg   pO2, Arterial 62.0 (*) 80.0 - 100.0 mmHg   Bicarbonate 19.1 (*) 20.0 - 24.0 mEq/L   TCO2 20  0 - 100 mmol/L   O2 Saturation 90.0     Acid-base deficit 8.0 (*) 0.0 - 2.0 mmol/L   Patient temperature 35.9 C     Collection site ARTERIAL LINE  Drawn by Operator     Sample type ARTERIAL    POCT I-STAT, CHEM 8     Status: Abnormal   Collection Time   11/03/11  9:57 PM      Component Value Range   Sodium 134 (*) 135 - 145 mEq/L   Potassium 5.4 (*) 3.5 - 5.1 mEq/L   Chloride 98  96 - 112 mEq/L   BUN 69 (*) 6 - 23 mg/dL   Creatinine, Ser 6.38 (*) 0.50 - 1.35 mg/dL   Glucose, Bld 756 (*) 70 - 99 mg/dL   Calcium, Ion 4.33 (*) 1.12 - 1.23 mmol/L   TCO2 18  0 - 100 mmol/L   Hemoglobin 7.1 (*) 13.0 - 17.0 g/dL   HCT 29.5 (*) 18.8 - 41.6 %  CBC     Status: Abnormal   Collection Time   11/03/11 10:00 PM      Component Value Range   WBC 13.8 (*) 4.0 - 10.5 K/uL   RBC 2.41 (*) 4.22 - 5.81 MIL/uL   Hemoglobin 7.4 (*) 13.0 - 17.0 g/dL   HCT 60.6 (*) 30.1 - 60.1 %   MCV 89.6  78.0 - 100.0 fL   MCH 30.7  26.0 - 34.0 pg    MCHC 34.3  30.0 - 36.0 g/dL   RDW 09.3  23.5 - 57.3 %   Platelets 95 (*) 150 - 400 K/uL  PROTIME-INR     Status: Abnormal   Collection Time   11/03/11 10:00 PM      Component Value Range   Prothrombin Time 26.5 (*) 11.6 - 15.2 seconds   INR 2.59 (*) 0.00 - 1.49  APTT     Status: Abnormal   Collection Time   11/03/11 10:00 PM      Component Value Range   aPTT 41 (*) 24 - 37 seconds  SODIUM, URINE, RANDOM     Status: Normal   Collection Time   11/03/11 10:17 PM      Component Value Range   Sodium, Ur 15    CREATININE, URINE, RANDOM     Status: Normal   Collection Time   11/03/11 10:17 PM      Component Value Range   Creatinine, Urine 200.05    PREPARE RBC (CROSSMATCH)     Status: Normal   Collection Time   11/03/11 11:30 PM      Component Value Range   Order Confirmation ORDER PROCESSED BY BLOOD BANK    GLUCOSE, CAPILLARY     Status: Abnormal   Collection Time   11/03/11 11:31 PM      Component Value Range   Glucose-Capillary 153 (*) 70 - 99 mg/dL  POCT I-STAT 3, BLOOD GAS (G3+)     Status: Abnormal   Collection Time   11/04/11  1:00 AM      Component Value Range   pH, Arterial 7.289 (*) 7.350 - 7.450   pCO2 arterial 46.0 (*) 35.0 - 45.0 mmHg   pO2, Arterial 76.0 (*) 80.0 - 100.0 mmHg   Bicarbonate 22.3  20.0 - 24.0 mEq/L   TCO2 24  0 - 100 mmol/L   O2 Saturation 94.0     Acid-base deficit 5.0 (*) 0.0 - 2.0 mmol/L   Patient temperature 36.1 C     Collection site ARTERIAL LINE     Drawn by Nurse     Sample type ARTERIAL    COMPREHENSIVE METABOLIC PANEL     Status: Abnormal   Collection Time  11/04/11  3:30 AM      Component Value Range   Sodium 136  135 - 145 mEq/L   Potassium 5.0  3.5 - 5.1 mEq/L   Chloride 95 (*) 96 - 112 mEq/L   CO2 21  19 - 32 mEq/L   Glucose, Bld 162 (*) 70 - 99 mg/dL   BUN 73 (*) 6 - 23 mg/dL   Creatinine, Ser 1.61 (*) 0.50 - 1.35 mg/dL   Calcium 8.0 (*) 8.4 - 10.5 mg/dL   Total Protein 4.9 (*) 6.0 - 8.3 g/dL   Albumin 2.6 (*) 3.5 - 5.2  g/dL   AST 0960 (*) 0 - 37 U/L   ALT 2643 (*) 0 - 53 U/L   Alkaline Phosphatase 54  39 - 117 U/L   Total Bilirubin 1.7 (*) 0.3 - 1.2 mg/dL   GFR calc non Af Amer 14 (*) >90 mL/min   GFR calc Af Amer 16 (*) >90 mL/min  CBC     Status: Abnormal   Collection Time   11/04/11  3:30 AM      Component Value Range   WBC 9.8  4.0 - 10.5 K/uL   RBC 2.90 (*) 4.22 - 5.81 MIL/uL   Hemoglobin 8.8 (*) 13.0 - 17.0 g/dL   HCT 45.4 (*) 09.8 - 11.9 %   MCV 87.9  78.0 - 100.0 fL   MCH 30.3  26.0 - 34.0 pg   MCHC 34.5  30.0 - 36.0 g/dL   RDW 14.7  82.9 - 56.2 %   Platelets 97 (*) 150 - 400 K/uL  MAGNESIUM     Status: Abnormal   Collection Time   11/04/11  3:30 AM      Component Value Range   Magnesium 3.7 (*) 1.5 - 2.5 mg/dL  APTT     Status: Abnormal   Collection Time   11/04/11  3:30 AM      Component Value Range   aPTT 41 (*) 24 - 37 seconds  PROTIME-INR     Status: Abnormal   Collection Time   11/04/11  3:30 AM      Component Value Range   Prothrombin Time 26.5 (*) 11.6 - 15.2 seconds   INR 2.59 (*) 0.00 - 1.49  GLUCOSE, CAPILLARY     Status: Abnormal   Collection Time   11/04/11  3:36 AM      Component Value Range   Glucose-Capillary 131 (*) 70 - 99 mg/dL  POCT I-STAT 3, BLOOD GAS (G3+)     Status: Abnormal   Collection Time   11/04/11  3:41 AM      Component Value Range   pH, Arterial 7.312 (*) 7.350 - 7.450   pCO2 arterial 47.1 (*) 35.0 - 45.0 mmHg   pO2, Arterial 85.0  80.0 - 100.0 mmHg   Bicarbonate 23.8  20.0 - 24.0 mEq/L   TCO2 25  0 - 100 mmol/L   O2 Saturation 95.0     Acid-base deficit 2.0  0.0 - 2.0 mmol/L   Patient temperature 37.0 C     Collection site ARTERIAL LINE     Drawn by Nurse     Sample type ARTERIAL    CARBOXYHEMOGLOBIN     Status: Abnormal   Collection Time   11/04/11  6:15 AM      Component Value Range   Total hemoglobin 9.3 (*) 13.5 - 18.0 g/dL   O2 Saturation 13.0     Carboxyhemoglobin 0.7  0.5 - 1.5 %  Methemoglobin 1.3  0.0 - 1.5 %  GLUCOSE,  CAPILLARY     Status: Abnormal   Collection Time   11/04/11  7:53 AM      Component Value Range   Glucose-Capillary 103 (*) 70 - 99 mg/dL   Comment 1 Notify RN      Studies/Results: @[id number] @  Medications: Reviewed   Patient Active Hospital Problem List:  Events of yesterday noted.  Patient now on pressors, IV amiodarone.  Maintaining SR.   Will continue to follow. Will need repeat echo but would not do now unless hemodynamics change.  LOS: 6 days   Dietrich Pates 11/04/2011, 10:31 AM

## 2011-11-04 NOTE — Addendum Note (Signed)
Addendum  created 11/04/11 0028 by Mickey Hebel A Collier Monica, CRNA   Modules edited:Anesthesia Medication Administration    

## 2011-11-04 NOTE — Progress Notes (Addendum)
Subjective: Pt underwent sternotomy in ICU after resp distress and brief PEA arrest. O/d/new blood evacuated with prompt improvement in BP. Then taken to OR where new drains were placed and wound vac applied. Making minimal urine still, 320 cc out yesterday, was 490 the day before. K+ 5.0, CO2 21, creat up to 4.48.  ABG 7.31/47/85. AST/ALT are up which is new (2-3K range).  On low dose dopamine, neo and epi drips, BP 120's systolic, PA pressure 47/30. INR 2.59, same as yesterday. UNa- 15, Ucreat 200.   Objective Vital signs in last 24 hours: Filed Vitals:   11/04/11 0615 11/04/11 0630 11/04/11 0645 11/04/11 0700  BP:      Pulse: 84 80 80 81  Temp: 99.5 F (37.5 C) 99.7 F (37.6 C) 99.9 F (37.7 C) 99.9 F (37.7 C)  TempSrc:      Resp: 23 15 15 15   Height:      Weight:      SpO2: 100% 99% 99% 99%   Weight change:   Intake/Output Summary (Last 24 hours) at 11/04/11 0801 Last data filed at 11/04/11 0645  Gross per 24 hour  Intake 6639.48 ml  Output    995 ml  Net 5644.48 ml   Labs: Basic Metabolic Panel:  Lab 11/04/11 1610 11/03/11 2157 11/03/11 1947 11/03/11 0345 11/02/11 0400 11/01/11 1630 11/01/11 0414 10/31/11 1716 10/31/11 0415 10/30/11 0255  NA 136 134* 135 132* 138 140 142 -- -- --  K 5.0 5.4* 4.2 4.9 4.2 4.0 4.1 -- -- --  CL 95* 98 -- 96 102 103 105 107 -- --  CO2 21 -- -- 24 30 29 30  -- 27 28  GLUCOSE 162* 131* -- 112* 102* 153* 152* 185* -- --  BUN 73* 69* -- 47* 25* 22 18 14  -- --  CREATININE 4.48* 4.10* -- 3.09* 1.30 1.37* 1.29 1.20 -- --  ALB -- -- -- -- -- -- -- -- -- --  CALCIUM 8.0* -- -- 8.3* 8.1* 8.0* 7.9* -- 7.7* 9.7  PHOS -- -- -- -- -- -- -- -- -- --   Liver Function Tests:  Lab 11/04/11 0330 11/03/11 0345 11/02/11 0400  AST 3129* 948* 120*  ALT 2643* 920* 147*  ALKPHOS 54 52 33*  BILITOT 1.7* 1.0 0.4  PROT 4.9* 5.6* 5.7*  ALBUMIN 2.6* 2.5* 2.7*   No results found for this basename: LIPASE:3,AMYLASE:3 in the last 168 hours No results found for  this basename: AMMONIA:3 in the last 168 hours CBC:  Lab 11/04/11 0330 11/03/11 2200 11/03/11 2157 11/03/11 1947 11/03/11 0345 11/02/11 0400 10/29/11 1601  WBC 9.8 13.8* -- -- 17.5* 17.3* --  NEUTROABS -- -- -- -- -- -- 10.7*  HGB 8.8* 7.4* 7.1* 7.5* -- -- --  HCT 25.5* 21.6* 21.0* 22.0* -- -- --  MCV 87.9 89.6 -- -- 91.9 90.5 --  PLT 97* 95* -- -- 163 124* --   PT/INR: @labrcntip (inr:5) Cardiac Enzymes:  Lab 10/30/11 0255 10/29/11 2117 10/29/11 1601  CKTOTAL -- -- --  CKMB -- -- --  CKMBINDEX -- -- --  TROPONINI <0.30 <0.30 <0.30   CBG:  Lab 11/04/11 0336 11/03/11 2331 11/03/11 1549 11/03/11 1201 11/03/11 0751  GLUCAP 131* 153* 118* 118* 112*    Iron Studies: No results found for this basename: IRON:30,TIBC:30,TRANSFERRIN:30,FERRITIN:30 in the last 168 hours  Physical Exam:  Blood pressure 108/59, pulse 81, temperature 99.9 F (37.7 C), temperature source Axillary, resp. rate 15, height 5\' 8"  (1.727 m), weight 118.7  kg (261 lb 11 oz), SpO2 99.00%.  Gen: not in distress  Skin: no rash, cyanosis  HEENT: EOMI, sclera anicteric, throat clear  Neck: no JVD, no bruits or LAN  Chest: clear bilat  Heart: regular, no rub or gallop, no murmur  Abdomen: soft, nontender, no HSM  Ext: warm, + pulses, 1+ edema  Neuro: alert, Ox3, no focal deficit   UA 9/28- no prot, 7-10 rbc, no wbc   Impression  1. Acute kidney injury- likely ATN from dye exposure x 2 and hypoperfusion during surgery. Moderate acidosis and now with evidence of mild pulm edema with high filling pressures.  Not making urine and doubt lasix will help. Primary concern right now would be volume control and secondarily control of acidosis. Will d/w surgeon. 2. 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  3. Shock. on 3 pressors 4. VDRF w mild pulm edema 5. Met acidosis due to AKI, mild-mod 6. Suspected PE   Vinson Moselle  MD [facility]  [facility] [phone] pgr    [phone] cell 11/04/2011, 8:01 AM

## 2011-11-04 NOTE — Progress Notes (Signed)
Chaplain Note:  Chaplain responded to request from family.  Chaplain provided prayer for pt and family while pt was in surgery.  Chaplain will follow up as needed.  11/03/11 1925  Clinical Encounter Type  Visited With Family  Visit Type Spiritual support  Referral From Nurse;Family  Spiritual Encounters  Spiritual Needs Emotional;Prayer  Stress Factors  Patient Stress Factors Health changes;Major life changes  Family Stress Factors Major life changes   Verdie Shire, [place] [phone]

## 2011-11-04 NOTE — Progress Notes (Signed)
1 Day Post-Op Procedure(s) (LRB): EXPLORATION POST OPERATIVE OPEN HEART (N/A) Subjective: Stable day after emergency sternotomy for tamponade and PEA in ICU, cardiac index 2.2 pressors been weaned sinus rhythm Ventilation/perfusion scan yesterday with significant(official report indeterminate) risk of pulmonary embolus to right lung, oxygenation better today weaning FiO2 to 80% chest x-ray remains fairly clear Low-dose bivalirudin started for anticoagulation with dropping platelet counts and history of present illness TSH pending. INR ranging between 2.0 and 2.4 with elevated LFTs.  Objective: Vital signs in last 24 hours: Temp:  [94.3 F (34.6 C)-100.6 F (38.1 C)] 99.7 F (37.6 C) (09/29 1615) Pulse Rate:  [54-112] 68  (09/29 1615) Cardiac Rhythm:  [-] Normal sinus rhythm (09/29 1600) Resp:  [0-29] 12  (09/29 1615) BP: (97-140)/(45-75) 124/69 mmHg (09/29 1600) SpO2:  [92 %-100 %] 100 % (09/29 1615) Arterial Line BP: (89-161)/(56-85) 137/76 mmHg (09/29 1615) FiO2 (%):  [60 %-100 %] 60 % (09/29 1558) Weight:  [257 lb 11.5 oz (116.9 kg)-261 lb 11 oz (118.7 kg)] 261 lb 11 oz (118.7 kg) (09/29 0600)  Hemodynamic parameters for last 24 hours: PAP: (34-56)/(23-38) 51/35 mmHg CO:  [3.5 L/min-7.2 L/min] 5.1 L/min CI:  [1.5 L/min/m2-3.2 L/min/m2] 2.2 L/min/m2  Intake/Output from previous day: 09/28 0701 - 09/29 0700 In: 6659.5 [I.V.:5398.7; Blood:680.8; NG/GT:60; IV Piggyback:520] Out: 995 [Urine:320; Emesis/NG output:250; Drains:225; Blood:200] Intake/Output this shift: Total I/O In: 1216.3 [I.V.:1066.3; IV Piggyback:150] Out: 750 [Urine:150; Emesis/NG output:300; Drains:300]  Exam Sedated on vent Lungs with scattered rhonchi Extremities warm with good pulses Pupils small   Lab Results:  Basename 11/04/11 1634 11/04/11 0330 11/03/11 2200  WBC -- 9.8 13.8*  HGB 7.8* 8.8* --  HCT 23.0* 25.5* --  PLT -- 97* 95*   BMET:  Basename 11/04/11 1634 11/04/11 0330 11/03/11 0345  NA  137 136 --  K 4.4 5.0 --  CL 97 95* --  CO2 -- 21 24  GLUCOSE 135* 162* --  BUN 94* 73* --  CREATININE 4.80* 4.48* --  CALCIUM -- 8.0* 8.3*    PT/INR:  Basename 11/04/11 0330  LABPROT 26.5*  INR 2.59*   ABG    Component Value Date/Time   PHART 7.312* 11/04/2011 0341   HCO3 23.8 11/04/2011 0341   TCO2 25 11/04/2011 1634   ACIDBASEDEF 2.0 11/04/2011 0341   O2SAT 59.3 11/04/2011 0615   CBG (last 3)   Basename 11/04/11 1546 11/04/11 1205 11/04/11 0753  GLUCAP 111* 119* 103*    Assessment/Plan: S/P Procedure(s) (LRB): EXPLORATION POST OPERATIVE OPEN HEART (N/A) Continue current support with nitric oxide 4 RV dysfunction and pulmonary hypertension Continue bivalirudin infusion 4 evidence of pulmonary embolus Sternal wound open with wound VAC to reduce compression RV, plan chest closure October 1 Urine output 25 cc per hour creatinine rising slightly today potassium is improved   LOS: 6 days    VAN TRIGT III,PETER 11/04/2011

## 2011-11-04 NOTE — Op Note (Signed)
NAMEHAYZE, Gabriel Tran NO.:  [id number]  MEDICAL RECORD NO.:  [id number]  LOCATION:  2311                         FACILITY:  [facility]  PHYSICIAN:  Kerin Perna, M.D.  DATE OF BIRTH:  December 11, 1955  DATE OF PROCEDURE:  11/03/2011 DATE OF DISCHARGE:                              OPERATIVE REPORT   OPERATION: 1. Emergency sternotomy in the ICU for tamponade. 2. Transport to the OR for sternal-mediastinal exploration, irrigation     and closure.  PREOPERATIVE DIAGNOSIS:  Cardiac tamponade, status post repair of type A dissection three days previously.  POSTOPERATIVE DIAGNOSIS:  Cardiac tamponade, status post repair of type A dissection three days previously.  SURGEON:  Kerin Perna, MD  ASSISTANT:  Evon Slack, RNFA  ANESTHESIA:  General by Dr. Claybon Jabs.  PROCEDURE:  The patient had been critically ill in the ICU with recent evidence of a pulmonary embolus with clinical hypoxia and evidence of RV dysfunction.  The patient was cautiously started on low-dose heparin infusion without bolus and placed on BiPAP.  He initially appeared to be stable, but then became agitated and developed PEA (pulseless electrical activity) and became hypotensive and was immediately given CPR oxygen supplementation by mask and then intubation.  CPR continued without degeneration of significant pressure and  some old blood drained out of his chest tube sites.  For that reason, the patient was critically prepped and draped and a emergency sternotomy was performed removing the sternal wires.  Once we opened the chest, some old clotted and non- clotted blood drained and his blood pressure immediately improved.  His blood pressure on inotropes at that point was 140.  There was no massive bleeding and a sterile drape was used to cover the chest and the patient was quickly transported back to the operating room.  In the operating room, the patient was anesthetized and remained  with a stable blood pressure and his pressors were weaned down.  He was prepped and draped as a sterile field.  The mediastinum was irrigated with copious amounts of warm vancomycin irrigation and warm saline.  The graft for the dissection repair was intact and there was no bleeding from the surgical anastomosis.  There was some mild bleeding from the sternum and there appeared to be a partial fracture of the outer table of the sternum in the midportion from probable CPR.  This was controlled with topical measures and electrocautery.  I placed two new drains in the anterior and posterior mediastinum and brought them out through the old chest tube sites.  New pacing wires were placed on the right atrium. I decided to leave the chest open.  All the grafts were used to repair the dissection appeared to be intact and widely patent with good pulses. I used the wound VAC system using the white sponge on the layer closest to the heart and then the black sponge covered with the sterile Vi-Drape and connected to the pump.  Chest tubes were placed to underwater seal suction.  The patient was then prepared for a left subclavian triple- lumen catheter, which was placed using the Seldinger technique and a sterile dressing was applied.  Next, the Theone Murdoch catheter was  placed to the re-prepped right sleeve by the Anesthesia team.  At the end of the procedure, the left ventricular function appeared to be well preserved.  Right ventricular function at the time of open chest inspection was hypokinetic and appeared to be significantly different from the appearance of the right ventricle at the end of the operation three days ago.  This was probably due to the recent pulmonary embolism and pulmonary hypertension.  Plans were made to start nitric oxide in the ICU and support the patient.     Kerin Perna, M.D.     PV/MEDQ  D:  11/03/2011  T:  11/04/2011  Job:  [id number]

## 2011-11-04 NOTE — Addendum Note (Signed)
Addendum  created 11/04/11 0028 by Elisabeth Most, CRNA   Modules edited:Anesthesia Medication Administration

## 2011-11-05 ENCOUNTER — Inpatient Hospital Stay (HOSPITAL_COMMUNITY): Payer: PRIVATE HEALTH INSURANCE

## 2011-11-05 LAB — BLOOD GAS, ARTERIAL
Acid-base deficit: 2.9 mmol/L — ABNORMAL HIGH (ref 0.0–2.0)
Bicarbonate: 22 mEq/L (ref 20.0–24.0)
Drawn by: 330991
FIO2: 0.6 %
MECHVT: 600 mL
Nitric Oxide: 20
O2 Saturation: 99.1 %
PEEP: 8 cmH2O
Patient temperature: 100.4
RATE: 12 resp/min
TCO2: 23.3 mmol/L (ref 0–100)
pCO2 arterial: 44.7 mmHg (ref 35.0–45.0)
pH, Arterial: 7.32 — ABNORMAL LOW (ref 7.350–7.450)
pO2, Arterial: 127 mmHg — ABNORMAL HIGH (ref 80.0–100.0)

## 2011-11-05 LAB — COMPREHENSIVE METABOLIC PANEL
ALT: 2394 U/L — ABNORMAL HIGH (ref 0–53)
AST: 1361 U/L — ABNORMAL HIGH (ref 0–37)
Albumin: 2.4 g/dL — ABNORMAL LOW (ref 3.5–5.2)
Alkaline Phosphatase: 76 U/L (ref 39–117)
BUN: 97 mg/dL — ABNORMAL HIGH (ref 6–23)
CO2: 23 mEq/L (ref 19–32)
Calcium: 7.4 mg/dL — ABNORMAL LOW (ref 8.4–10.5)
Chloride: 99 mEq/L (ref 96–112)
Creatinine, Ser: 6.08 mg/dL — ABNORMAL HIGH (ref 0.50–1.35)
GFR calc Af Amer: 11 mL/min — ABNORMAL LOW (ref >90)
GFR calc non Af Amer: 9 mL/min — ABNORMAL LOW (ref >90)
Glucose, Bld: 116 mg/dL — ABNORMAL HIGH (ref 70–99)
Potassium: 4 mEq/L (ref 3.5–5.1)
Sodium: 137 mEq/L (ref 135–145)
Total Bilirubin: 2.5 mg/dL — ABNORMAL HIGH (ref 0.3–1.2)
Total Protein: 4.7 g/dL — ABNORMAL LOW (ref 6.0–8.3)

## 2011-11-05 LAB — URINE CULTURE
Colony Count: NO GROWTH
Culture: NO GROWTH

## 2011-11-05 LAB — RENAL FUNCTION PANEL
BUN: 95 mg/dL — ABNORMAL HIGH (ref 6–23)
CO2: 23 mEq/L (ref 19–32)
Calcium: 7.5 mg/dL — ABNORMAL LOW (ref 8.4–10.5)
GFR calc Af Amer: 12 mL/min — ABNORMAL LOW (ref >90)
Glucose, Bld: 115 mg/dL — ABNORMAL HIGH (ref 70–99)
Phosphorus: 7.1 mg/dL — ABNORMAL HIGH (ref 2.3–4.6)
Sodium: 136 mEq/L (ref 135–145)

## 2011-11-05 LAB — APTT
aPTT: 53 seconds — ABNORMAL HIGH (ref 24–37)
aPTT: 54 seconds — ABNORMAL HIGH (ref 24–37)
aPTT: 59 seconds — ABNORMAL HIGH (ref 24–37)
aPTT: 69 seconds — ABNORMAL HIGH (ref 24–37)

## 2011-11-05 LAB — GLUCOSE, CAPILLARY
Glucose-Capillary: 85 mg/dL (ref 70–99)
Glucose-Capillary: 80 mg/dL (ref 70–99)
Glucose-Capillary: 98 mg/dL (ref 70–99)
Glucose-Capillary: 101 mg/dL — ABNORMAL HIGH (ref 70–99)
Glucose-Capillary: 97 mg/dL (ref 70–99)

## 2011-11-05 LAB — CBC
Hemoglobin: 8.9 g/dL — ABNORMAL LOW (ref 13.0–17.0)
MCH: 29.7 pg (ref 26.0–34.0)
MCHC: 35.2 g/dL (ref 30.0–36.0)
MCV: 84.3 fL (ref 78.0–100.0)

## 2011-11-05 LAB — PROTIME-INR: INR: 2.55 — ABNORMAL HIGH (ref 0.00–1.49)

## 2011-11-05 LAB — POCT I-STAT 3, ART BLOOD GAS (G3+)
Acid-base deficit: 12 mmol/L — ABNORMAL HIGH (ref 0.0–2.0)
Bicarbonate: 15.7 mEq/L — ABNORMAL LOW (ref 20.0–24.0)
O2 Saturation: 85 %
TCO2: 17 mmol/L (ref 0–100)

## 2011-11-05 MED ORDER — CISATRACURIUM BESYLATE (PF) 10 MG/5ML IV SOLN
10.0000 mg | Freq: Once | INTRAVENOUS | Status: DC
  Filled 2011-11-05 (×2): qty 5

## 2011-11-05 MED ORDER — DEXTROSE 5 % IV SOLN
750.0000 mg | INTRAVENOUS | Status: DC
  Filled 2011-11-05: qty 750

## 2011-11-05 MED ORDER — NITROGLYCERIN IN D5W 200-5 MCG/ML-% IV SOLN
2.0000 ug/min | INTRAVENOUS | Status: AC
Start: 2011-11-06 — End: 2011-11-06
  Administered 2011-11-06: 40 ug/min

## 2011-11-05 MED ORDER — EPINEPHRINE HCL 1 MG/ML IJ SOLN
0.5000 ug/min | INTRAMUSCULAR | Status: AC
  Administered 2011-11-06: 0.5 ug/min
  Filled 2011-11-05: qty 4

## 2011-11-05 MED ORDER — PHENYLEPHRINE HCL 10 MG/ML IJ SOLN
30.0000 ug/min | INTRAVENOUS | Status: DC
  Filled 2011-11-05: qty 2

## 2011-11-05 MED ORDER — SODIUM CHLORIDE 0.9 % IV SOLN
INTRAVENOUS | Status: DC
  Filled 2011-11-05: qty 1

## 2011-11-05 MED ORDER — SODIUM CHLORIDE 0.9 % IV SOLN
2.0000 mg/h | INTRAVENOUS | Status: DC
  Administered 2011-11-05 – 2011-11-06 (×3): 2 mg/h via INTRAVENOUS
  Filled 2011-11-05 (×2): qty 10

## 2011-11-05 MED ORDER — DOPAMINE-DEXTROSE 3.2-5 MG/ML-% IV SOLN
2.0000 ug/kg/min | INTRAVENOUS | Status: AC
  Administered 2011-11-06: 3 ug/kg/min

## 2011-11-05 MED ORDER — CHLORHEXIDINE GLUCONATE 4 % EX LIQD
60.0000 mL | Freq: Once | CUTANEOUS | Status: AC
  Administered 2011-11-06: 4 via TOPICAL
  Filled 2011-11-05: qty 60

## 2011-11-05 MED ORDER — SODIUM CHLORIDE 0.9 % IV SOLN
INTRAVENOUS | Status: DC
  Filled 2011-11-05: qty 40

## 2011-11-05 MED ORDER — BISACODYL 5 MG PO TBEC: 5.0000 mg | DELAYED_RELEASE_TABLET | Freq: Once | ORAL | Status: DC

## 2011-11-05 MED ORDER — PLASMA-LYTE 148 IV SOLN
INTRAVENOUS | Status: DC
  Filled 2011-11-05: qty 1

## 2011-11-05 MED ORDER — ALBUMIN HUMAN 5 % IV SOLN
12.5000 g | Freq: Once | INTRAVENOUS | Status: AC
  Administered 2011-11-05: 12.5 g via INTRAVENOUS
  Filled 2011-11-05: qty 250

## 2011-11-05 MED ORDER — MAGNESIUM SULFATE 50 % IJ SOLN
40.0000 meq | INTRAMUSCULAR | Status: DC
  Filled 2011-11-05: qty 10

## 2011-11-05 MED ORDER — SODIUM BICARBONATE 8.4 % IV SOLN
INTRAVENOUS | Status: DC
  Filled 2011-11-05: qty 3.32

## 2011-11-05 MED ORDER — TEMAZEPAM 15 MG PO CAPS: 15.0000 mg | ORAL_CAPSULE | Freq: Once | ORAL | Status: AC | PRN

## 2011-11-05 MED ORDER — VANCOMYCIN HCL 1000 MG IV SOLR
1500.0000 mg | INTRAVENOUS | Status: DC
  Administered 2011-11-05: 1500 mg via INTRAVENOUS
  Filled 2011-11-05: qty 1500

## 2011-11-05 MED ORDER — CISATRACURIUM BOLUS VIA INFUSION
10.0000 mg | Freq: Once | INTRAVENOUS | Status: DC
  Filled 2011-11-05: qty 10

## 2011-11-05 MED ORDER — VERAPAMIL HCL 2.5 MG/ML IV SOLN
INTRAVENOUS | Status: DC
  Filled 2011-11-05: qty 3.3

## 2011-11-05 MED ORDER — POTASSIUM CHLORIDE 2 MEQ/ML IV SOLN
80.0000 meq | INTRAVENOUS | Status: DC
  Filled 2011-11-05: qty 40

## 2011-11-05 MED ORDER — DEXTROSE 5 % IV SOLN
2.0000 g | INTRAVENOUS | Status: DC
  Filled 2011-11-05: qty 2

## 2011-11-05 MED ORDER — DEXTROSE 5 % IV SOLN
1.5000 g | INTRAVENOUS | Status: AC
  Administered 2011-11-06: 1.5 g via INTRAVENOUS
  Filled 2011-11-05: qty 1.5

## 2011-11-05 MED ORDER — DEXMEDETOMIDINE HCL IN NACL 400 MCG/100ML IV SOLN: 0.1000 ug/kg/h | INTRAVENOUS | Status: DC

## 2011-11-05 NOTE — Progress Notes (Signed)
Patient ID: Gabriel Tran, male   DOB: Jun 03, 1955, 56 y.o.   MRN: [id number]                   [address]            [place] [address]          [phone]     2 Days Post-Op Procedure(s) (LRB): EXPLORATION POST OPERATIVE OPEN HEART (N/A)  Total Length of Stay:  LOS: 7 days  BP 144/77  Pulse 80  Temp 98.4 F (36.9 C) (Core (Comment))  Resp 13  Ht 5\' 8"  (1.727 m)  Wt 266 lb 5.1 oz (120.8 kg)  BMI 40.49 kg/m2  SpO2 97%     . sodium chloride 20 mL/hr (11/03/11 2358)  . sodium chloride 20 mL/hr at 11/04/11 0700  . sodium chloride 10 mL/hr at 11/05/11 1251  . sodium chloride    . sodium chloride 15 mL/hr (11/04/11 2216)  . sodium chloride    . amiodarone (NEXTERONE PREMIX) 360 mg/200 mL dextrose 15 mg/hr (11/05/11 1200)  . bivalirudin (ANGIOMAX) infusion 0.5 mg/mL (Non-ACS indications) 0.015 mg/kg/hr (11/05/11 1800)  . dexmedetomidine 0.8 mcg/kg/hr (11/05/11 1700)  . DOPamine 3 mcg/kg/min (11/05/11 1200)  . epinephrine 1 mcg/min (11/05/11 1200)  . fentaNYL infusion INTRAVENOUS 100 mcg/hr (11/05/11 1617)  . midazolam (VERSED) infusion 2 mg/hr (11/05/11 1216)  . nitroGLYCERIN 30 mcg/min (11/05/11 1900)  . phenylephrine (NEO-SYNEPHRINE) Adult infusion Stopped (11/04/11 1230)     Lab Results  Component Value Date   WBC 10.0 11/05/2011   HGB 8.9* 11/05/2011   HCT 25.3* 11/05/2011   PLT 83* 11/05/2011   GLUCOSE 116* 11/05/2011   CHOL 191 10/30/2011   TRIG 106 10/30/2011   HDL 53 10/30/2011   LDLDIRECT 142.8 08/31/2011   LDLCALC 117* 10/30/2011   ALT 2394* 11/05/2011   AST 1361* 11/05/2011   NA 137 11/05/2011   K 4.0 11/05/2011   CL 99 11/05/2011   CREATININE 6.08* 11/05/2011   BUN 97* 11/05/2011   CO2 23 11/05/2011   TSH 1.148 10/29/2011   PSA 0.72 08/31/2011   INR 2.55* 11/05/2011   HGBA1C 5.9* 10/29/2011   MICROALBUR 2.8* 08/31/2011   Making some urine , cr up to 6.08 For wound closure in am   Delight Ovens MD  Beeper [phone] Office  [phone] 11/05/2011 7:54 PM

## 2011-11-05 NOTE — Progress Notes (Signed)
ANTICOAGULATION CONSULT NOTE - Follow Up Consult  Pharmacy Consult for bivalirudin Indication: pulmonary embolus and r/o HIT  Labs:  Basename 11/05/11 0015 11/04/11 1820 11/04/11 1634 11/04/11 1300 11/04/11 0330 11/03/11 2200 11/03/11 1600  HGB -- 8.0* 7.8* -- -- -- --  HCT -- 22.7* 23.0* -- 25.5* -- --  PLT -- 78* -- -- 97* 95* --  APTT 69* 84* -- 98* -- -- --  LABPROT -- -- -- -- 26.5* 26.5* 21.0*  INR -- -- -- -- 2.59* 2.59* 1.89*  HEPARINUNFRC -- -- -- -- -- -- --  CREATININE -- 5.40* 4.80* -- 4.48* -- --  CKTOTAL -- -- -- -- -- -- --  CKMB -- -- -- -- -- -- --  TROPONINI -- -- -- -- -- -- --    Assessment: 56yo male remains supratherapeutic on bivalirudin with low tight goal though getting closer to goal.  Goal of Therapy:  aPTT 50-55 seconds   Plan:  Will decrease bival gtt by 25% to 0.019 mg/kg/hr and check PTT in 4hr.  Colleen Can PharmD BCPS 11/05/2011,1:32 AM

## 2011-11-05 NOTE — Progress Notes (Signed)
ANTICOAGULATION CONSULT NOTE - Follow Up Consult  Pharmacy Consult for bivalirudin Indication: pulmonary embolus and r/o HIT  Labs:  Basename 11/05/11 0554 11/05/11 0357 11/05/11 0353 11/05/11 0015 11/04/11 1820 11/04/11 1634 11/04/11 0330 11/03/11 2200  HGB -- -- 8.9* -- 8.0* -- -- --  HCT -- -- 25.3* -- 22.7* 23.0* -- --  PLT -- -- 83* -- 78* -- 97* --  APTT 59* -- -- 69* 84* -- -- --  LABPROT 26.2* -- -- -- -- -- 26.5* 26.5*  INR 2.55* -- -- -- -- -- 2.59* 2.59*  HEPARINUNFRC -- -- -- -- -- -- -- --  CREATININE -- 5.77* -- -- 5.40* 4.80* -- --  CKTOTAL -- -- -- -- -- -- -- --  CKMB -- -- -- -- -- -- -- --  TROPONINI -- -- -- -- -- -- -- --    Assessment: 56yo male remains supratherapeutic on bivalirudin with low tight goal though getting closer to goal. Infusing without problem at 0.019 mg/kg/hr, no bleeding noted.  Goal of Therapy:  aPTT 50-55 seconds   Plan:  Will decrease bival gtt by 20% to 0.015 mg/kg/hr and check PTT in 4hr.   Thank you for allowing pharmacy to be a part of this patients care team.  Lovenia Kim Pharm.D., BCPS Clinical Pharmacist 11/05/2011 6:44 AM Pager: [phone] Phone: [phone]

## 2011-11-05 NOTE — Progress Notes (Signed)
Pt appearing very agitated and very tired. Increased PIP and pt seems to really be forcing exhalation. RT placed pt back on full support at this time. Pt seams to be tolerating somewhat better. RT will continue to monitor.

## 2011-11-05 NOTE — Progress Notes (Signed)
INITIAL ADULT NUTRITION ASSESSMENT Date: 11/05/2011   Time: 9:43 AM  INTERVENTION:  If EN started, recommend Osmolite 1.5 formula at 25 ml/hr with Prostat liquid protein 30 ml 6 times daily via tube to provide 1500 kcals (65% of estimated kcal needs), 128 gm protein (98% of estimated protein needs), 457 ml of free water  Liquid MVI via tube daily RD to follow for nutrition care plan  Reason for Assessment: VDRF, Low Braden  ASSESSMENT: Male 56 y.o.  Dx: cardiac tamponade  Hx:  Past Medical History  Diagnosis Date  . Hypertension   . Hyperglycemia     a. A1C 5.9 in Sept 2013.  Marland Kitchen RBBB     Related Meds:     . acetaminophen (TYLENOL) oral liquid 160 mg/5 mL  650 mg Per Tube NOW   Or  . acetaminophen  650 mg Rectal NOW  . albumin human  12.5 g Intravenous Once  . antiseptic oral rinse  15 mL Mouth Rinse QID  . aspirin EC  325 mg Oral Daily   Or  . aspirin  324 mg Per Tube Daily  . bisacodyl  10 mg Oral Daily   Or  . bisacodyl  10 mg Rectal Daily  . cefTAZidime (FORTAZ)  IV  1 g Intravenous Q12H  . chlorhexidine  15 mL Mouth Rinse BID  . cisatracurium  10 mg Intravenous Once  . docusate sodium  200 mg Oral Daily  . famotidine (PEPCID) IV  20 mg Intravenous Q12H  . insulin aspart  0-24 Units Subcutaneous Q4H  . magnesium sulfate  40 mEq Other To OR  . metoprolol tartrate  12.5 mg Oral BID   Or  . metoprolol tartrate  12.5 mg Per Tube BID  . pantoprazole  40 mg Oral Q1200  . sodium chloride  10-40 mL Intracatheter Q12H  . sodium chloride  3 mL Intravenous Q12H  . vancomycin  1,000 mg Intravenous Q24H  . DISCONTD: acetaminophen (TYLENOL) oral liquid 160 mg/5 mL  975 mg Per Tube Q6H  . DISCONTD: acetaminophen  1,000 mg Oral Q6H  . DISCONTD: cefUROXime (ZINACEF)  IV  1.5 g Intravenous Q12H  . DISCONTD: insulin regular  0-10 Units Intravenous TID WC  . DISCONTD: vancomycin  1,000 mg Intravenous Q24H    Ht: 5\' 8"  (172.7 cm)  Wt: 266 lb 5.1 oz (120.8 kg)  Ideal Wt:  70 kg % Ideal Wt: 172%  Usual Wt: 237 lb -- per office visit records % Usual Wt: 112%  Body mass index is 40.49 kg/(m^2).  Food/Nutrition Related Hx: no triggers per admission nutrition screen  Labs:  CMP     Component Value Date/Time   NA 137 11/05/2011 1100   K 4.0 11/05/2011 1100   CL 99 11/05/2011 1100   CO2 23 11/05/2011 1100   GLUCOSE 116* 11/05/2011 1100   BUN 97* 11/05/2011 1100   CREATININE 6.08* 11/05/2011 1100   CALCIUM 7.4* 11/05/2011 1100   PROT 4.7* 11/05/2011 1100   ALBUMIN 2.4* 11/05/2011 1100   AST 1361* 11/05/2011 1100   ALT 2394* 11/05/2011 1100   ALKPHOS 76 11/05/2011 1100   BILITOT 2.5* 11/05/2011 1100   GFRNONAA 9* 11/05/2011 1100   GFRAA 11* 11/05/2011 1100    Phosphorus  Date Value Range Status  11/05/2011 7.1* 2.3 - 4.6 mg/dL Final    Magnesium  Date Value Range Status  11/04/2011 3.5* 1.5 - 2.5 mg/dL Final     Intake/Output Summary (Last 24 hours) at 11/05/11 1244 Last  data filed at 11/05/11 1200  Gross per 24 hour  Intake 3229.78 ml  Output   1640 ml  Net 1589.78 ml    CBG (last 3)   Basename 11/05/11 1222 11/05/11 0816 11/05/11 0401  GLUCAP 98 80 85    Diet Order: NPO  Supplements/Tube Feeding: N/A  IVF:    sodium chloride Last Rate: 20 mL/hr (11/03/11 2358)  sodium chloride Last Rate: 20 mL/hr at 11/04/11 0700  sodium chloride Last Rate: 20 mL/hr at 11/01/11 1142  sodium chloride   sodium chloride Last Rate: 15 mL/hr (11/04/11 2216)  sodium chloride   amiodarone (NEXTERONE PREMIX) 360 mg/200 mL dextrose Last Rate: 15 mg/hr (11/05/11 0900)  bivalirudin (ANGIOMAX) infusion 0.5 mg/mL (Non-ACS indications) Last Rate: 0.015 mg/kg/hr (11/05/11 0900)  dexmedetomidine Last Rate: 0.7 mcg/kg/hr (11/05/11 0900)  DOPamine Last Rate: 3 mcg/kg/min (11/05/11 0900)  epinephrine Last Rate: 1 mcg/min (11/05/11 0900)  fentaNYL infusion INTRAVENOUS Last Rate: 50 mcg/hr (11/05/11 0900)  nitroGLYCERIN   phenylephrine (NEO-SYNEPHRINE) Adult infusion  Last Rate: Stopped (11/04/11 1230)  DISCONTD: bivalirudin (ANGIOMAX) infusion 5 mg/mL (Cath Lab,ACS,PCI indication) Last Rate: Stopped (11/04/11 1330)  DISCONTD: bivalirudin (ANGIOMAX) infusion 5 mg/mL (Cath Lab,ACS,PCI indication) Last Rate: Stopped (11/04/11 1915)  DISCONTD: DOBUTamine   DISCONTD: lactated ringers Last Rate: Stopped (11/04/11 0800)    Estimated Nutritional Needs:   Kcal: 2300 Protein: 130-140 gm Fluid: per MD  Patient is currently intubated on ventilator support MV: 8.4 Temp: 37.6  Patient s/p sternotomy after respiratory distress and brief PEA arrest; taken to OR where new drains were placed and wound vac applied to sternum; OGT in place; bowel sounds normal; 1-2 + edema to lower extremities and back; low braden score places patient at risk for further skin breakdown; going back to OR for VAC change and possible sternal re-closure 10/1.  NUTRITION DIAGNOSIS: -Inadequate oral intake (NI-2.1).  Status: Ongoing  RELATED TO: inability to eat  AS EVIDENCE BY: NPO status  MONITORING/EVALUATION(Goals): Goal: Initiate EN support in next 24 hours if prolonged intubation expected Monitor: EN initiation, respiratory status, weight, labs, I/O's  EDUCATION NEEDS: -No education needs identified at this time  Dietitian [phone]  DOCUMENTATION CODES Per approved criteria  -Morbid Obesity    Alger Memos 11/05/2011, 9:43 AM

## 2011-11-05 NOTE — Progress Notes (Signed)
Subjective: Patient intubated, sedated. Rhythm is atrial paced. Objective: Filed Vitals:   11/05/11 0741 11/05/11 0800 11/05/11 0900 11/05/11 0920  BP: 135/70 153/72 122/69 117/56  Pulse: 80 84 80 80  Temp:  99.3 F (37.4 C) 99.7 F (37.6 C)   TempSrc:      Resp: 1 16 15 24   Height:      Weight:      SpO2: 94% 99% 97% 100%   Weight change: 8 lb 9.6 oz (3.9 kg)  Intake/Output Summary (Last 24 hours) at 11/05/11 0940 Last data filed at 11/05/11 0900  Gross per 24 hour  Intake 2805.42 ml  Output   1635 ml  Net 1170.42 ml    General: INtubated, sedated. Neck:  Full Heart: Regular rate and rhythm, without murmurs, rubs, gallops. No aortic insufficiency Lungs: Clear anteriorly. Exemities:  Trace edema.  Strong pedal pulses.   Tele:  Currently Atrial paced at 80/min   Lab Results: Results for orders placed during the hospital encounter of 10/29/11 (from the past 24 hour(s))  GLUCOSE, CAPILLARY     Status: Abnormal   Collection Time   11/04/11 12:05 PM      Component Value Range   Glucose-Capillary 119 (*) 70 - 99 mg/dL   Comment 1 Notify RN    CULTURE, BLOOD (ROUTINE X 2)     Status: Normal (Preliminary result)   Collection Time   11/04/11 12:50 PM      Component Value Range   Specimen Description BLOOD LEFT ARM     Special Requests BOTTLES DRAWN AEROBIC ONLY 5CC     Culture  Setup Time 11/04/2011 21:10     Culture       Value:        BLOOD CULTURE RECEIVED NO GROWTH TO DATE CULTURE WILL BE HELD FOR 5 DAYS BEFORE ISSUING A FINAL NEGATIVE REPORT   Report Status PENDING    APTT     Status: Abnormal   Collection Time   11/04/11  1:00 PM      Component Value Range   aPTT 98 (*) 24 - 37 seconds  CULTURE, BLOOD (ROUTINE X 2)     Status: Normal (Preliminary result)   Collection Time   11/04/11  1:15 PM      Component Value Range   Specimen Description BLOOD LEFT FOOT     Special Requests BOTTLES DRAWN AEROBIC ONLY 5CC     Culture  Setup Time 11/04/2011 21:10     Culture       Value:        BLOOD CULTURE RECEIVED NO GROWTH TO DATE CULTURE WILL BE HELD FOR 5 DAYS BEFORE ISSUING A FINAL NEGATIVE REPORT   Report Status PENDING    GLUCOSE, CAPILLARY     Status: Abnormal   Collection Time   11/04/11  3:46 PM      Component Value Range   Glucose-Capillary 111 (*) 70 - 99 mg/dL   Comment 1 Notify RN    POCT I-STAT, CHEM 8     Status: Abnormal   Collection Time   11/04/11  4:34 PM      Component Value Range   Sodium 137  135 - 145 mEq/L   Potassium 4.4  3.5 - 5.1 mEq/L   Chloride 97  96 - 112 mEq/L   BUN 94 (*) 6 - 23 mg/dL   Creatinine, Ser 1.61 (*) 0.50 - 1.35 mg/dL   Glucose, Bld 096 (*) 70 - 99 mg/dL   Calcium,  Ion 0.99 (*) 1.12 - 1.23 mmol/L   TCO2 25  0 - 100 mmol/L   Hemoglobin 7.8 (*) 13.0 - 17.0 g/dL   HCT 16.1 (*) 09.6 - 04.5 %  MAGNESIUM     Status: Abnormal   Collection Time   11/04/11  6:20 PM      Component Value Range   Magnesium 3.5 (*) 1.5 - 2.5 mg/dL  CBC     Status: Abnormal   Collection Time   11/04/11  6:20 PM      Component Value Range   WBC 10.0  4.0 - 10.5 K/uL   RBC 2.62 (*) 4.22 - 5.81 MIL/uL   Hemoglobin 8.0 (*) 13.0 - 17.0 g/dL   HCT 40.9 (*) 81.1 - 91.4 %   MCV 86.6  78.0 - 100.0 fL   MCH 30.5  26.0 - 34.0 pg   MCHC 35.2  30.0 - 36.0 g/dL   RDW 78.2  95.6 - 21.3 %   Platelets 78 (*) 150 - 400 K/uL  BASIC METABOLIC PANEL     Status: Abnormal   Collection Time   11/04/11  6:20 PM      Component Value Range   Sodium 134 (*) 135 - 145 mEq/L   Potassium 4.3  3.5 - 5.1 mEq/L   Chloride 94 (*) 96 - 112 mEq/L   CO2 25  19 - 32 mEq/L   Glucose, Bld 134 (*) 70 - 99 mg/dL   BUN 90 (*) 6 - 23 mg/dL   Creatinine, Ser 0.86 (*) 0.50 - 1.35 mg/dL   Calcium 7.7 (*) 8.4 - 10.5 mg/dL   GFR calc non Af Amer 11 (*) >90 mL/min   GFR calc Af Amer 13 (*) >90 mL/min  APTT     Status: Abnormal   Collection Time   11/04/11  6:20 PM      Component Value Range   aPTT 84 (*) 24 - 37 seconds  GLUCOSE, CAPILLARY     Status: Abnormal    Collection Time   11/04/11  9:10 PM      Component Value Range   Glucose-Capillary 112 (*) 70 - 99 mg/dL  APTT     Status: Abnormal   Collection Time   11/05/11 12:15 AM      Component Value Range   aPTT 69 (*) 24 - 37 seconds  GLUCOSE, CAPILLARY     Status: Normal   Collection Time   11/05/11 12:30 AM      Component Value Range   Glucose-Capillary 80  70 - 99 mg/dL  CBC     Status: Abnormal   Collection Time   11/05/11  3:53 AM      Component Value Range   WBC 10.0  4.0 - 10.5 K/uL   RBC 3.00 (*) 4.22 - 5.81 MIL/uL   Hemoglobin 8.9 (*) 13.0 - 17.0 g/dL   HCT 57.8 (*) 46.9 - 62.9 %   MCV 84.3  78.0 - 100.0 fL   MCH 29.7  26.0 - 34.0 pg   MCHC 35.2  30.0 - 36.0 g/dL   RDW 52.8  41.3 - 24.4 %   Platelets 83 (*) 150 - 400 K/uL  RENAL FUNCTION PANEL     Status: Abnormal   Collection Time   11/05/11  3:57 AM      Component Value Range   Sodium 136  135 - 145 mEq/L   Potassium 4.2  3.5 - 5.1 mEq/L   Chloride 97  96 -  112 mEq/L   CO2 23  19 - 32 mEq/L   Glucose, Bld 115 (*) 70 - 99 mg/dL   BUN 95 (*) 6 - 23 mg/dL   Creatinine, Ser 9.14 (*) 0.50 - 1.35 mg/dL   Calcium 7.5 (*) 8.4 - 10.5 mg/dL   Phosphorus 7.1 (*) 2.3 - 4.6 mg/dL   Albumin 2.4 (*) 3.5 - 5.2 g/dL   GFR calc non Af Amer 10 (*) >90 mL/min   GFR calc Af Amer 12 (*) >90 mL/min  GLUCOSE, CAPILLARY     Status: Normal   Collection Time   11/05/11  4:01 AM      Component Value Range   Glucose-Capillary 85  70 - 99 mg/dL  PROTIME-INR     Status: Abnormal   Collection Time   11/05/11  5:54 AM      Component Value Range   Prothrombin Time 26.2 (*) 11.6 - 15.2 seconds   INR 2.55 (*) 0.00 - 1.49  APTT     Status: Abnormal   Collection Time   11/05/11  5:54 AM      Component Value Range   aPTT 59 (*) 24 - 37 seconds  GLUCOSE, CAPILLARY     Status: Normal   Collection Time   11/05/11  8:16 AM      Component Value Range   Glucose-Capillary 80  70 - 99 mg/dL   Comment 1 Documented in Chart     Comment 2 Notify RN       Studies/Results: Chest xray reviewed.  Medications: Reviewed   Patient Active Hospital Problem List:  Stable after PEA arrest 11/03/11. Continues on bivalirudin for pulmonary emboli. Continues on pressors and on low lose IV amiodarone. Will continue to follow. Will need repeat echo but would not do now unless hemodynamics change.  LOS: 7 days   Cassell Clement 11/05/2011, 9:40 AM

## 2011-11-05 NOTE — Progress Notes (Signed)
Physical Therapy Discharge Patient Details Name: Gabriel Tran MRN: [id number] DOB: [birth year] Today's Date: 11/05/2011 Time:  -     Patient discharged from PT services secondary to medical decline - will need to re-order PT to resume therapy services.  Please see latest therapy progress note for current level of functioning and progress toward goals.    Progress and discharge plan discussed with patient and/or caregiver: Patient unable to participate in discharge planning and no caregivers available.  GP  [facility] PT [phone]      [facility] 11/05/2011, 11:56 AM

## 2011-11-05 NOTE — Progress Notes (Signed)
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 Maintaining sinus rhythm on IV amiodarone Urine output 30 cc per hour. FiO2 decreased from 100% to 55% Wound VAC drainage serosanguineous approximately 400 cc per shift hemoglobin stable 8.8 g  Objective: Vital signs in last 24 hours: Temp:  [99.3 F (37.4 C)-100.4 F (38 C)] 99.7 F (37.6 C) (09/30 0900) Pulse Rate:  [67-88] 80  (09/30 0920) Cardiac Rhythm:  [-] A-V Sequential paced (09/30 0800) Resp:  [1-29] 24  (09/30 0920) BP: (99-153)/(54-75) 117/56 mmHg (09/30 0920) SpO2:  [92 %-100 %] 100 % (09/30 0920) Arterial Line BP: (120-164)/(61-85) 120/61 mmHg (09/30 0900) FiO2 (%):  [60 %-80 %] 60 % (09/30 0920) Weight:  [266 lb 5.1 oz (120.8 kg)] 266 lb 5.1 oz (120.8 kg) (09/30 0500)  Hemodynamic parameters for last 24 hours: PAP: (32-56)/(15-38) 32/15 mmHg CO:  [5.1 L/min-6.3 L/min] 6.3 L/min CI:  [2.2 L/min/m2-2.8 L/min/m2] 2.8 L/min/m2  Intake/Output from previous day: 09/29 0701 - 09/30 0700 In: 3046.7 [I.V.:2418.3; Blood:358.3; NG/GT:70; IV Piggyback:200] Out: 1575 [Urine:510; Emesis/NG output:600; Drains:450; Chest Tube:15] Intake/Output this shift: Total I/O In: 130 [I.V.:110; NG/GT:20] Out: 175 [Urine:75; Drains:100]  Exam Sedated on vent but moves all extremities not to command Lungs with scattered rhonchi Extremities warm with good pulses  Lab Results:  [facility] 11/05/11 0353 11/04/11 1820  WBC 10.0 10.0  HGB 8.9* 8.0*  HCT 25.3* 22.7*  PLT 83*  78*   BMET:  Basename 11/05/11 0357 11/04/11 1820  NA 136 134*  K 4.2 4.3  CL 97 94*  CO2 23 25  GLUCOSE 115* 134*  BUN 95* 90*  CREATININE 5.77* 5.40*  CALCIUM 7.5* 7.7*    PT/INR:  Basename 11/05/11 0554  LABPROT 26.2*  INR 2.55*   ABG    Component Value Date/Time   PHART 7.312* 11/04/2011 0341   HCO3 23.8 11/04/2011 0341   TCO2 25 11/04/2011 1634   ACIDBASEDEF 2.0 11/04/2011 0341   O2SAT 59.3 11/04/2011 0615   CBG (last 3)   Basename 11/05/11 0816 11/05/11 0401 11/05/11 0030  GLUCAP 80 85 80    Assessment/Plan: S/P Procedure(s) (LRB): EXPLORATION POST OPERATIVE OPEN HEART (N/A) Multi-system dysfunction from postoperative delayed tamponade and pulmonary embolus. Chest open with wound VAC. Stable hemodynamics on inhaled nitric oxide and low-dose pressors We'll plan on return to or for mediastinal exploration, wound VAC change and possible sternal reclosure tomorrow. Patient will remain heavily sedated due to open sternum and high risk of bleeding if he starts moving or fighting the ventilator   LOS: 7 days    VAN TRIGT III,Gabriel Tran 11/05/2011

## 2011-11-05 NOTE — Progress Notes (Signed)
Anticoagulation Consult Note: Bivalirudin Indication: PE and r/o HIT  PTT = 54 on 0.015 mg/kg/hr Goal PTT=50-55  PTT is in desired range. Will continue same rate and f/u PTT in the AM.  Cardell Peach, PharmD

## 2011-11-05 NOTE — Progress Notes (Addendum)
ANTICOAGULATION CONSULT NOTE - Follow Up Consult  Pharmacy Consult for bivalirudin/vancomycin Indication: pulmonary embolus and r/o HIT, surgical prophylaxis  Labs:  Basename 11/05/11 1100 11/05/11 0554 11/05/11 0357 11/05/11 0353 11/05/11 0015 11/04/11 1820 11/04/11 1634 11/04/11 0330 11/03/11 2200  HGB -- -- -- 8.9* -- 8.0* -- -- --  HCT -- -- -- 25.3* -- 22.7* 23.0* -- --  PLT -- -- -- 83* -- 78* -- 97* --  APTT 53* 59* -- -- 69* -- -- -- --  LABPROT -- 26.2* -- -- -- -- -- 26.5* 26.5*  INR -- 2.55* -- -- -- -- -- 2.59* 2.59*  HEPARINUNFRC -- -- -- -- -- -- -- -- --  CREATININE 6.08* -- 5.77* -- -- 5.40* -- -- --  CKTOTAL -- -- -- -- -- -- -- -- --  CKMB -- -- -- -- -- -- -- -- --  TROPONINI -- -- -- -- -- -- -- -- --    Assessment: 56yo male now therapeutic on bivalirudin with low tight goal.  High risk of bleeding noted.His SrCr has increased.  No immediate plans for HD.  Goal of Therapy:  aPTT 50-55 seconds   Plan:  Cont bival gtt at  0.015 mg/kg/hr and check PTT in 4hr to assure therapeutic. Adjust vancomycin and fortaz for renal function.   Thank you for allowing pharmacy to be a part of this patients care team.  Talbert Cage Pharm.D. Clinical Pharmacist 11/05/2011 12:21 PM Pager: [phone] Phone: [phone]

## 2011-11-05 NOTE — Progress Notes (Signed)
Patient ID: Gabriel Tran, male   DOB: 1955/02/17, 56 y.o.   MRN: [id number]   [facility] Progress Note    Subjective:   No acute events noted overnight. Remains intubated.   Objective:   BP 128/71  Pulse 80  Temp 99.9 F (37.7 C) (Core (Comment))  Resp 19  Ht 5\' 8"  (1.727 m)  Wt 120.8 kg (266 lb 5.1 oz)  BMI 40.49 kg/m2  SpO2 100%  Intake/Output Summary (Last 24 hours) at 11/05/11 0801 Last data filed at 11/05/11 0700  Gross per 24 hour  Intake 2848.77 ml  Output   1460 ml  Net 1388.77 ml   Weight change: 3.9 kg (8 lb 9.6 oz)  Physical Exam: WUJ:[id number], opens eyes to stimulation of oral care NFA:OZHYQ RRR, distant S1 and S2 Resp:CTA bilaterally- sternotomy dressing with midline drain noted MVH:QION, obese, NT, BS normal Ext:1-2+ edema over LEs/back  Imaging: Nm Pulmonary Perf And Vent  11/03/2011  *RADIOLOGY REPORT*  Clinical Data:  Rule out pulmonary embolism.  Shortness of breath.  NUCLEAR MEDICINE VENTILATION - PERFUSION LUNG SCAN  Technique:  Wash-in, equilibrium, and wash-out phase ventilation images were obtained using Xe-133 gas.  Perfusion images were obtained in multiple projections after intravenous injection of Tc- 91m MAA.  Radiopharmaceuticals:  10.0 mCi Xe-133 gas and 3.0 mCi Tc-43m MAA.  Comparison:  Plain film chest of 1 day prior.  Findings: Ventilation images are nondiagnostic.  Per technologist report, the patient could not hold his breath.  Perfusion images demonstrate a moderate sized defect involving the right inferior lateral hemithorax.  A probable separate moderate sized anterior defect within the right hemithorax on the lateral image.  IMPRESSION: Intermediate probability for pulmonary embolism.  The perfusion study demonstrates 1 and likely 2 moderate size defects.  However, the ventilation study is nondiagnostic, secondary to technique related factors.   Original Report Authenticated By: Consuello Bossier, M.D.    Dg Chest Port 1  View  11/04/2011  *RADIOLOGY REPORT*  Clinical Data: Status post aortic valve replacement.  PORTABLE CHEST - 1 VIEW  Comparison: 1 day prior  Findings: Endotracheal tube remains appropriately positioned.  A Swan-Ganz catheter from a right IJ approach has tip within either the main or proximal descending right pulmonary artery branch.  Left-sided subclavian line is poorly visualized centrally likely similar.  Nasogastric tube extends beyond the  inferior aspect of the film.  Cardiomegaly accentuated by AP portable technique.  Superior mediastinal soft tissue fullness is likely due to AP portable technique.  Suspect small bilateral pleural effusions. No pneumothorax.  Lung volumes are diminished with similar moderate interstitial edema.  Slightly improved left greater than right lower lobe predominant atelectasis.  IMPRESSION: 1.  Slightly improved aeration with persistent interstitial edema and decreased bibasilar atelectasis. 2.  Probable small bilateral pleural effusions. 3.  Swan-Ganz catheter with tip in distal main main or proximal descending pulmonary artery branch.  Consider retraction 2 cm.   Original Report Authenticated By: Consuello Bossier, M.D.    Dg Chest Portable 1 View  11/03/2011  *RADIOLOGY REPORT*  Clinical Data: Postop cardiotomy  PORTABLE CHEST - 1 VIEW  Comparison: Chest radiograph 11/03/2011  Findings: Endotracheal tube is 3.4 cm from carina.  Swan-Ganz catheter in place with tip in the right main pulmonary artery. Left central venous line is in place with tip in the mid SVC.  NG tube extends below the margin of the film in the course of the esophagus.  There are low lung volumes.  Bilateral pleural effusions.  Mild pulmonary edema pattern. No pneumothorax.  IMPRESSION:  1.  Support apparatus appears in good position. 2.  Low lung volumes.  3.  Mild pulmonary edema pattern.   Original Report Authenticated By: Genevive Bi, M.D.     Labs: BMET  Lab 11/05/11 1610 11/04/11 1820 11/04/11  1634 11/04/11 0330 11/03/11 2157 11/03/11 1947 11/03/11 0345 11/02/11 0400 11/01/11 1630 11/01/11 0414  NA 136 134* 137 136 134* 135 132* -- -- --  K 4.2 4.3 4.4 5.0 5.4* 4.2 4.9 -- -- --  CL 97 94* 97 95* 98 -- 96 102 -- --  CO2 23 25 -- 21 -- -- 24 30 29 30   GLUCOSE 115* 134* 135* 162* 131* -- 112* 102* -- --  BUN 95* 90* 94* 73* 69* -- 47* 25* -- --  CREATININE 5.77* 5.40* 4.80* 4.48* 4.10* -- 3.09* 1.30 -- --  ALB -- -- -- -- -- -- -- -- -- --  CALCIUM 7.5* 7.7* -- 8.0* -- -- 8.3* 8.1* 8.0* 7.9*  PHOS 7.1* -- -- -- -- -- -- -- -- --   CBC  Lab 11/05/11 0353 11/04/11 1820 11/04/11 1634 11/04/11 0330 11/03/11 2200 10/29/11 1601  WBC 10.0 10.0 -- 9.8 13.8* --  NEUTROABS -- -- -- -- -- 10.7*  HGB 8.9* 8.0* 7.8* 8.8* -- --  HCT 25.3* 22.7* 23.0* 25.5* -- --  MCV 84.3 86.6 -- 87.9 89.6 --  PLT 83* 78* -- 97* 95* --    Medications:      . acetaminophen (TYLENOL) oral liquid 160 mg/5 mL  650 mg Per Tube NOW   Or  . acetaminophen  650 mg Rectal NOW  . acetaminophen  1,000 mg Oral Q6H   Or  . acetaminophen (TYLENOL) oral liquid 160 mg/5 mL  975 mg Per Tube Q6H  . antiseptic oral rinse  15 mL Mouth Rinse QID  . aspirin EC  325 mg Oral Daily   Or  . aspirin  324 mg Per Tube Daily  . bisacodyl  10 mg Oral Daily   Or  . bisacodyl  10 mg Rectal Daily  . cefTAZidime (FORTAZ)  IV  1 g Intravenous Q12H  . chlorhexidine  15 mL Mouth Rinse BID  . docusate sodium  200 mg Oral Daily  . famotidine (PEPCID) IV  20 mg Intravenous Q12H  . insulin aspart  0-24 Units Subcutaneous Q4H  . insulin regular  0-10 Units Intravenous TID WC  . magnesium sulfate  40 mEq Other To OR  . metoprolol tartrate  12.5 mg Oral BID   Or  . metoprolol tartrate  12.5 mg Per Tube BID  . pantoprazole  40 mg Oral Q1200  . sodium chloride  10-40 mL Intracatheter Q12H  . sodium chloride  3 mL Intravenous Q12H  . vancomycin  1,000 mg Intravenous Q24H  . DISCONTD: cefUROXime (ZINACEF)  IV  1.5 g Intravenous Q12H    . DISCONTD: vancomycin  1,000 mg Intravenous Q24H     Assessment/ Plan:   1. Acute kidney injury- likely ATN from IV contrast exposure and hypoperfusion/ischemic injury during and post-surgery. Renal function continues to worsen slowly but metabolic acidosis improved. Remains barely non-oliguric but without acute HD needs at present-will re-evaluate this PM for need to try diuretic bolus. 2. 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  3. Cardiogenic/hypovolemic (hemorrhagic) shock. Weaning pressors with  fairly tolerable BPs- on Epi/Dopamine 4. VDRF w mild pulm edema/central pulmonary vascular congestion appearing stable and probably improved since yesterday 5. Suspected PE: On bivalrudin due to HIT concerns  Zetta Bills, MD 11/05/2011, 8:01 AM

## 2011-11-06 ENCOUNTER — Encounter (HOSPITAL_COMMUNITY): Payer: Self-pay | Admitting: Anesthesiology

## 2011-11-06 ENCOUNTER — Inpatient Hospital Stay (HOSPITAL_COMMUNITY): Payer: PRIVATE HEALTH INSURANCE | Admitting: Anesthesiology

## 2011-11-06 ENCOUNTER — Inpatient Hospital Stay (HOSPITAL_COMMUNITY): Payer: PRIVATE HEALTH INSURANCE

## 2011-11-06 ENCOUNTER — Encounter (HOSPITAL_COMMUNITY)
Admission: EM | Disposition: A | Discharge status: Other Acute Inpatient Hospital | Payer: Self-pay | Source: Home / Self Care | Attending: Cardiothoracic Surgery

## 2011-11-06 DIAGNOSIS — I314 Cardiac tamponade: Secondary | ICD-10-CM

## 2011-11-06 DIAGNOSIS — N289 Disorder of kidney and ureter, unspecified: Secondary | ICD-10-CM

## 2011-11-06 HISTORY — PX: MEDIASTINAL EXPLORATION: SHX5065

## 2011-11-06 HISTORY — PX: INSERTION OF DIALYSIS CATHETER: SHX1324

## 2011-11-06 LAB — GLUCOSE, CAPILLARY
Glucose-Capillary: 106 mg/dL — ABNORMAL HIGH (ref 70–99)
Glucose-Capillary: 87 mg/dL (ref 70–99)
Glucose-Capillary: 93 mg/dL (ref 70–99)
Glucose-Capillary: 94 mg/dL (ref 70–99)
Glucose-Capillary: 97 mg/dL (ref 70–99)

## 2011-11-06 LAB — POCT I-STAT 3, ART BLOOD GAS (G3+)
Acid-base deficit: 2 mmol/L (ref 0.0–2.0)
Acid-base deficit: 4 mmol/L — ABNORMAL HIGH (ref 0.0–2.0)
O2 Saturation: 93 %
O2 Saturation: 98 %
TCO2: 25 mmol/L (ref 0–100)

## 2011-11-06 LAB — CBC
HCT: 23.6 % — ABNORMAL LOW (ref 39.0–52.0)
HCT: 23.5 % — ABNORMAL LOW (ref 39.0–52.0)
Hemoglobin: 8.4 g/dL — ABNORMAL LOW (ref 13.0–17.0)
MCH: 29.7 pg (ref 26.0–34.0)
MCH: 29.8 pg (ref 26.0–34.0)
MCHC: 35.2 g/dL (ref 30.0–36.0)
MCHC: 35.7 g/dL (ref 30.0–36.0)
MCV: 84.6 fL (ref 78.0–100.0)
MCV: 83.3 fL (ref 78.0–100.0)
Platelets: 103 10*3/uL — ABNORMAL LOW (ref 150–400)
Platelets: 110 10*3/uL — ABNORMAL LOW (ref 150–400)
RBC: 2.82 MIL/uL — ABNORMAL LOW (ref 4.22–5.81)
RDW: 14.6 % (ref 11.5–15.5)
RDW: 14 % (ref 11.5–15.5)
WBC: 13.2 10*3/uL — ABNORMAL HIGH (ref 4.0–10.5)

## 2011-11-06 LAB — COMPREHENSIVE METABOLIC PANEL
ALT: 1679 U/L — ABNORMAL HIGH (ref 0–53)
AST: 642 U/L — ABNORMAL HIGH (ref 0–37)
Albumin: 2.1 g/dL — ABNORMAL LOW (ref 3.5–5.2)
Alkaline Phosphatase: 72 U/L (ref 39–117)
BUN: 107 mg/dL — ABNORMAL HIGH (ref 6–23)
CO2: 22 mEq/L (ref 19–32)
Calcium: 7.7 mg/dL — ABNORMAL LOW (ref 8.4–10.5)
Chloride: 103 mEq/L (ref 96–112)
Creatinine, Ser: 6.59 mg/dL — ABNORMAL HIGH (ref 0.50–1.35)
GFR calc Af Amer: 10 mL/min — ABNORMAL LOW (ref >90)
GFR calc non Af Amer: 9 mL/min — ABNORMAL LOW (ref >90)
Glucose, Bld: 98 mg/dL (ref 70–99)
Potassium: 4 mEq/L (ref 3.5–5.1)
Sodium: 141 mEq/L (ref 135–145)
Total Bilirubin: 2.3 mg/dL — ABNORMAL HIGH (ref 0.3–1.2)
Total Protein: 4.5 g/dL — ABNORMAL LOW (ref 6.0–8.3)

## 2011-11-06 LAB — BASIC METABOLIC PANEL
BUN: 107 mg/dL — ABNORMAL HIGH (ref 6–23)
CO2: 20 mEq/L (ref 19–32)
Calcium: 7.7 mg/dL — ABNORMAL LOW (ref 8.4–10.5)
Chloride: 99 mEq/L (ref 96–112)
Creatinine, Ser: 6.72 mg/dL — ABNORMAL HIGH (ref 0.50–1.35)
GFR calc Af Amer: 10 mL/min — ABNORMAL LOW (ref >90)
GFR calc non Af Amer: 8 mL/min — ABNORMAL LOW (ref >90)
Glucose, Bld: 119 mg/dL — ABNORMAL HIGH (ref 70–99)
Potassium: 3.9 mEq/L (ref 3.5–5.1)
Sodium: 137 mEq/L (ref 135–145)

## 2011-11-06 LAB — APTT
aPTT: 54 seconds — ABNORMAL HIGH (ref 24–37)
aPTT: 40 seconds — ABNORMAL HIGH (ref 24–37)

## 2011-11-06 LAB — RENAL FUNCTION PANEL
CO2: 20 mEq/L (ref 19–32)
Calcium: 7.5 mg/dL — ABNORMAL LOW (ref 8.4–10.5)
Chloride: 101 mEq/L (ref 96–112)
GFR calc Af Amer: 10 mL/min — ABNORMAL LOW (ref >90)
GFR calc non Af Amer: 9 mL/min — ABNORMAL LOW (ref >90)
Sodium: 139 mEq/L (ref 135–145)

## 2011-11-06 LAB — PROTIME-INR: INR: 1.96 — ABNORMAL HIGH (ref 0.00–1.49)

## 2011-11-06 SURGERY — INSERTION OF DIALYSIS CATHETER
Anesthesia: General | Site: Groin | Laterality: Right | Wound class: Clean

## 2011-11-06 MED ORDER — PRISMASOL BGK 4/2.5 32-4-2.5 MEQ/L IV SOLN
INTRAVENOUS | Status: DC
  Administered 2011-11-06 – 2011-11-12 (×37): via INTRAVENOUS_CENTRAL
  Filled 2011-11-06 (×68): qty 5000

## 2011-11-06 MED ORDER — VANCOMYCIN HCL 1000 MG IV SOLR
INTRAVENOUS | Status: AC
  Administered 2011-11-06: 10:00:00
  Filled 2011-11-06: qty 500

## 2011-11-06 MED ORDER — SODIUM CHLORIDE 0.9 % IV SOLN: 2500.0000 ug | INTRAVENOUS | Status: DC | PRN

## 2011-11-06 MED ORDER — SODIUM CHLORIDE 0.9 % IJ SOLN
OROMUCOSAL | Status: DC | PRN
  Administered 2011-11-06 (×2): via TOPICAL

## 2011-11-06 MED ORDER — NITROGLYCERIN IN D5W 200-5 MCG/ML-% IV SOLN: INTRAVENOUS | Status: DC | PRN

## 2011-11-06 MED ORDER — SODIUM CHLORIDE 0.9 % IR SOLN
Status: DC | PRN
  Administered 2011-11-06: 1000 mL

## 2011-11-06 MED ORDER — DEXMEDETOMIDINE HCL IN NACL 200 MCG/50ML IV SOLN
INTRAVENOUS | Status: DC | PRN
  Administered 2011-11-06: 0.4 ug/kg/h via INTRAVENOUS

## 2011-11-06 MED ORDER — NEPRO/CARBSTEADY PO LIQD
1000.0000 mL | ORAL | Status: DC
  Administered 2011-11-06 – 2011-11-07 (×2): 1000 mL
  Filled 2011-11-06 (×3): qty 1000

## 2011-11-06 MED ORDER — SODIUM CHLORIDE 0.9 % IV SOLN
50.0000 mg | INTRAVENOUS | Status: DC | PRN
  Administered 2011-11-06: 2 mg/h via INTRAVENOUS

## 2011-11-06 MED ORDER — 0.9 % SODIUM CHLORIDE (POUR BTL) OPTIME
TOPICAL | Status: DC | PRN
  Administered 2011-11-06: 2000 mL

## 2011-11-06 MED ORDER — FENTANYL BOLUS VIA INFUSION
50.0000 ug | INTRAVENOUS | Status: DC | PRN
  Filled 2011-11-06: qty 100

## 2011-11-06 MED ORDER — VANCOMYCIN HCL IN DEXTROSE 1-5 GM/200ML-% IV SOLN
1000.0000 mg | Freq: Once | INTRAVENOUS | Status: AC
  Administered 2011-11-06: 1000 mg via INTRAVENOUS
  Filled 2011-11-06: qty 200

## 2011-11-06 MED ORDER — PRISMASOL BGK 4/2.5 32-4-2.5 MEQ/L IV SOLN
INTRAVENOUS | Status: DC
  Administered 2011-11-06 – 2011-11-11 (×8): via INTRAVENOUS_CENTRAL
  Filled 2011-11-06 (×14): qty 5000

## 2011-11-06 MED ORDER — PANTOPRAZOLE SODIUM 40 MG IV SOLR
40.0000 mg | INTRAVENOUS | Status: DC
  Administered 2011-11-06 – 2011-11-10 (×4): 40 mg via INTRAVENOUS
  Filled 2011-11-06 (×8): qty 40

## 2011-11-06 MED ORDER — SODIUM CHLORIDE 0.9 % FOR CRRT
INTRAVENOUS_CENTRAL | Status: DC | PRN
  Filled 2011-11-06: qty 1000

## 2011-11-06 MED ORDER — ROCURONIUM BROMIDE 100 MG/10ML IV SOLN
INTRAVENOUS | Status: DC | PRN
  Administered 2011-11-06 (×2): 50 mg via INTRAVENOUS

## 2011-11-06 MED ORDER — FENTANYL CITRATE 0.05 MG/ML IJ SOLN
2500.0000 ug | INTRAMUSCULAR | Status: DC | PRN
  Administered 2011-11-06: 100 ug/h via INTRAVENOUS

## 2011-11-06 MED ORDER — VANCOMYCIN HCL IN DEXTROSE 1-5 GM/200ML-% IV SOLN: 1000.0000 mg | INTRAVENOUS | Status: DC

## 2011-11-06 MED ORDER — VANCOMYCIN HCL 1000 MG IV SOLR
1000.0000 mg | INTRAVENOUS | Status: AC
  Administered 2011-11-06: 1000 mg
  Filled 2011-11-06: qty 1000

## 2011-11-06 MED ORDER — PRISMASOL BGK 4/2.5 32-4-2.5 MEQ/L IV SOLN
INTRAVENOUS | Status: DC
  Administered 2011-11-06 – 2011-11-12 (×10): via INTRAVENOUS_CENTRAL
  Filled 2011-11-06 (×14): qty 5000

## 2011-11-06 MED ORDER — SODIUM CHLORIDE 0.9 % IR SOLN
Status: DC | PRN
  Administered 2011-11-06: 10:00:00

## 2011-11-06 MED ORDER — DEXTROSE 5 % IV SOLN
2.0000 g | Freq: Two times a day (BID) | INTRAVENOUS | Status: DC
  Administered 2011-11-06 – 2011-11-13 (×14): 2 g via INTRAVENOUS
  Filled 2011-11-06 (×15): qty 2

## 2011-11-06 MED ORDER — VANCOMYCIN HCL 1000 MG IV SOLR
INTRAVENOUS | Status: DC
  Filled 2011-11-06: qty 500

## 2011-11-06 MED ORDER — DEXTROSE 5 % IV SOLN: 1.0000 g | Freq: Three times a day (TID) | INTRAVENOUS | Status: DC

## 2011-11-06 MED ORDER — SODIUM CHLORIDE 0.9 % IV SOLN
0.0150 mg/kg/h | INTRAVENOUS | Status: DC
  Administered 2011-11-06 – 2011-11-12 (×5): 0.015 mg/kg/h via INTRAVENOUS
  Filled 2011-11-06 (×6): qty 250

## 2011-11-06 MED ORDER — DEXTROSE 5 % IV SOLN
2.0000 g | INTRAVENOUS | Status: DC
  Filled 2011-11-06: qty 2

## 2011-11-06 SURGICAL SUPPLY — 71 items
APL SKNCLS STERI-STRIP NONHPOA (GAUZE/BANDAGES/DRESSINGS)
ATTRACTOMAT 16X20 MAGNETIC DRP (DRAPES) ×2 IMPLANT
BAG DECANTER FOR FLEXI CONT (MISCELLANEOUS) ×4 IMPLANT
BANDAGE GAUZE ELAST BULKY 4 IN (GAUZE/BANDAGES/DRESSINGS) IMPLANT
BENZOIN TINCTURE PRP APPL 2/3 (GAUZE/BANDAGES/DRESSINGS) IMPLANT
BLADE SURG 10 STRL SS (BLADE) ×2 IMPLANT
BLADE SURG 11 STRL SS (BLADE) ×2 IMPLANT
CANISTER SUCTION 2500CC (MISCELLANEOUS) ×8 IMPLANT
CATH FOLEY 2WAY SLVR  5CC 16FR (CATHETERS)
CATH FOLEY 2WAY SLVR 5CC 16FR (CATHETERS) IMPLANT
CATH THORACIC 28FR RT ANG (CATHETERS) IMPLANT
CATH THORACIC 36FR (CATHETERS) IMPLANT
CATH THORACIC 36FR RT ANG (CATHETERS) ×4 IMPLANT
CLIP TI WIDE RED SMALL 24 (CLIP) IMPLANT
CLOTH BEACON ORANGE TIMEOUT ST (SAFETY) ×4 IMPLANT
CONN Y 3/8X3/8X3/8  BEN (MISCELLANEOUS)
CONN Y 3/8X3/8X3/8 BEN (MISCELLANEOUS) IMPLANT
CONT SPEC 4OZ CLIKSEAL STRL BL (MISCELLANEOUS) IMPLANT
COVER SURGICAL LIGHT HANDLE (MISCELLANEOUS) ×6 IMPLANT
DRAPE LAPAROSCOPIC ABDOMINAL (DRAPES) ×4 IMPLANT
DRAPE SLUSH/WARMER DISC (DRAPES) ×2 IMPLANT
DRAPE WARM FLUID 44X44 (DRAPE) IMPLANT
DRSG COVADERM 4X14 (GAUZE/BANDAGES/DRESSINGS) ×4 IMPLANT
DRSG PAD ABDOMINAL 8X10 ST (GAUZE/BANDAGES/DRESSINGS) IMPLANT
ELECT REM PT RETURN 9FT ADLT (ELECTROSURGICAL) ×4
ELECTRODE REM PT RTRN 9FT ADLT (ELECTROSURGICAL) ×2 IMPLANT
GAUZE XEROFORM 5X9 LF (GAUZE/BANDAGES/DRESSINGS) ×2 IMPLANT
GLOVE BIO SURGEON STRL SZ 6.5 (GLOVE) ×1 IMPLANT
GLOVE BIO SURGEON STRL SZ7.5 (GLOVE) ×8 IMPLANT
GLOVE BIO SURGEONS STRL SZ 6.5 (GLOVE) ×1
GLOVE BIOGEL PI IND STRL 7.0 (GLOVE) IMPLANT
GLOVE BIOGEL PI INDICATOR 7.0 (GLOVE) ×6
GLOVE ORTHO TXT STRL SZ7.5 (GLOVE) ×4 IMPLANT
GOWN PREVENTION PLUS XLARGE (GOWN DISPOSABLE) ×2 IMPLANT
GOWN STRL NON-REIN LRG LVL3 (GOWN DISPOSABLE) ×14 IMPLANT
HANDPIECE INTERPULSE COAX TIP (DISPOSABLE) ×4
HEMOSTAT POWDER SURGIFOAM 1G (HEMOSTASIS) ×4 IMPLANT
HEMOSTAT SURGICEL 2X14 (HEMOSTASIS) IMPLANT
KIT BASIN OR (CUSTOM PROCEDURE TRAY) ×4 IMPLANT
KIT ROOM TURNOVER OR (KITS) ×4 IMPLANT
KIT SUCTION CATH 14FR (SUCTIONS) ×2 IMPLANT
MARKER SKIN DUAL TIP RULER LAB (MISCELLANEOUS) ×2 IMPLANT
NS IRRIG 1000ML POUR BTL (IV SOLUTION) ×6 IMPLANT
PACK CHEST (CUSTOM PROCEDURE TRAY) ×4 IMPLANT
PAD ARMBOARD 7.5X6 YLW CONV (MISCELLANEOUS) ×8 IMPLANT
POWER TRIALYSIS SHORT-TERM STRAIGHT DIALYSIS CATHETER KIT ×2 IMPLANT
SCRUB BETADINE 4OZ XXX (MISCELLANEOUS) ×2 IMPLANT
SET HNDPC FAN SPRY TIP SCT (DISPOSABLE) IMPLANT
SOLUTION BETADINE 4OZ (MISCELLANEOUS) ×2 IMPLANT
SPONGE GAUZE 4X4 12PLY (GAUZE/BANDAGES/DRESSINGS) ×6 IMPLANT
SPONGE LAP 18X18 X RAY DECT (DISPOSABLE) ×2 IMPLANT
STAPLER VISISTAT 35W (STAPLE) ×2 IMPLANT
STRAP MONTGOMERY 1.25X11-1/8 (MISCELLANEOUS) IMPLANT
SUT ETHILON 3 0 FSL (SUTURE) IMPLANT
SUT SILK  1 MH (SUTURE) ×2
SUT SILK 1 MH (SUTURE) IMPLANT
SUT STEEL 6MS V (SUTURE) IMPLANT
SUT STEEL STERNAL CCS#1 18IN (SUTURE) ×4 IMPLANT
SUT STEEL SZ 6 DBL 3X14 BALL (SUTURE) ×2 IMPLANT
SUT VIC AB 1 CTX 18 (SUTURE) ×10 IMPLANT
SUT VIC AB 1 CTX 36 (SUTURE) ×8
SUT VIC AB 1 CTX36XBRD ANBCTR (SUTURE) ×4 IMPLANT
SUT VIC AB 2-0 CTX 27 (SUTURE) ×8 IMPLANT
SUT VIC AB 3-0 X1 27 (SUTURE) ×8 IMPLANT
SWAB COLLECTION DEVICE MRSA (MISCELLANEOUS) IMPLANT
SYR 20CC LL (SYRINGE) ×2 IMPLANT
SYR 5ML LL (SYRINGE) IMPLANT
TOWEL OR 17X24 6PK STRL BLUE (TOWEL DISPOSABLE) ×4 IMPLANT
TOWEL OR 17X26 10 PK STRL BLUE (TOWEL DISPOSABLE) ×6 IMPLANT
TUBE ANAEROBIC SPECIMEN COL (MISCELLANEOUS) IMPLANT
WATER STERILE IRR 1000ML POUR (IV SOLUTION) ×4 IMPLANT

## 2011-11-06 NOTE — Transfer of Care (Signed)
Immediate Anesthesia Transfer of Care Note  Patient: Gabriel Tran  Procedure(s) Performed: Procedure(s) (LRB) with comments: INSERTION OF DIALYSIS CATHETER (Right) MEDIASTINAL EXPLORATION (N/A) - sternal closure; removal of wound vac  Patient Location: SICU  Anesthesia Type: General  Level of Consciousness: sedated  Airway & Oxygen Therapy: Patient remains intubated per anesthesia plan and Patient placed on Ventilator (see vital sign flow sheet for setting)  Post-op Assessment: Post -op Vital signs reviewed and stable and report to SICU RN  Post vital signs: Reviewed and stable  Complications: No apparent anesthesia complications

## 2011-11-06 NOTE — Anesthesia Postprocedure Evaluation (Signed)
Anesthesia Post Note  Patient: Gabriel Tran  Procedure(s) Performed: Procedure(s) (LRB): INSERTION OF DIALYSIS CATHETER (Right) MEDIASTINAL EXPLORATION (N/A)  Anesthesia type: General  Patient location: ICU  Post pain: Pain level controlled  Post assessment: Post-op Vital signs reviewed  Last Vitals:  Filed Vitals:   11/06/11 0800  BP:   Pulse: 80  Temp:   Resp: 9    Post vital signs: stable  Level of consciousness: Patient remains intubated per anesthesia plan  Complications: No apparent anesthesia complications

## 2011-11-06 NOTE — Progress Notes (Signed)
Patient transported to OR, report given to Clydie Braun, Scientist, clinical (histocompatibility and immunogenetics).

## 2011-11-06 NOTE — Progress Notes (Signed)
The patient was examined and preop studies reviewed. There has been no change from the prior exam and the patient is ready for surgery.  Plan sternal reclosure today

## 2011-11-06 NOTE — Preoperative (Signed)
Beta Blockers   Reason not to administer Beta Blockers:Hold beta blocker due to other, Held lopressor per ICU orders

## 2011-11-06 NOTE — Progress Notes (Signed)
ANTICOAGULATION CONSULT NOTE - Follow Up Consult  Pharmacy Consult for bivalirudin Indication: PE and r/o HIT  Labs:  Basename 11/06/11 0443 11/05/11 1811 11/05/11 1100 11/05/11 0554 11/05/11 0357 11/05/11 0353 11/04/11 1820 11/04/11 0330  HGB 8.3* -- -- -- -- 8.9* -- --  HCT 23.6* -- -- -- -- 25.3* 22.7* --  PLT 103* -- -- -- -- 83* 78* --  APTT 54* 54* 53* -- -- -- -- --  LABPROT 21.6* -- -- 26.2* -- -- -- 26.5*  INR 1.96* -- -- 2.55* -- -- -- 2.59*  HEPARINUNFRC -- -- -- -- -- -- -- --  CREATININE 6.59* -- 6.08* -- 5.77* -- -- --  CKTOTAL -- -- -- -- -- -- -- --  CKMB -- -- -- -- -- -- -- --  TROPONINI -- -- -- -- -- -- -- --    Assessment/Plan:  56yo male remains therapeutic on bivalirudin for PE.  HIT panel still in process.  Will continue gtt at current rate and monitor.  Colleen Can PharmD BCPS 11/06/2011,6:05 AM

## 2011-11-06 NOTE — Progress Notes (Signed)
Pt in OR .

## 2011-11-06 NOTE — Progress Notes (Signed)
Patient ID: Gabriel Tran, male   DOB: 02/23/1955, 56 y.o.   MRN: [id number]   [facility] Progress Note    Subjective:   Had secondary closure of his sternal wound earlier today with removal of wound vac and placement of a right IJ dialysis catheter. No acute events overnight.    Objective:   BP 94/47  Pulse 79  Temp 94.6 F (34.8 C) (Core (Comment))  Resp 19  Ht 5\' 8"  (1.727 m)  Wt 122.8 kg (270 lb 11.6 oz)  BMI 41.16 kg/m2  SpO2 100%  Intake/Output Summary (Last 24 hours) at 11/06/11 1318 Last data filed at 11/06/11 1200  Gross per 24 hour  Intake 2939.8 ml  Output   1460 ml  Net 1479.8 ml   Weight change: 2 kg (4 lb 6.6 oz)  Physical Exam: Gen: Intubated, sedated CVS: Pulse regular in rate and rhythm, heart sounds S1 and S2 are distant without any obvious murmurs rubs or gallops Resp: Mechanical/ventilated breath sounds bilaterally, no rales/rhonchi Abd: Soft, obese, nontender without any organomegaly and bowel sounds are normal Ext: 2+ edema over upper and lower extremities  Imaging: Dg Chest Port 1 View  11/06/2011  *RADIOLOGY REPORT*  Clinical Data: Aortic valve replacement.  Diatek catheter placement.  PORTABLE CHEST - 1 VIEW  Comparison: One-view chest 11/06/2011 at 06:45 a.m.  Findings: The patient is intubated.  Endotracheal tube is stable position.  A Swan-Ganz catheter enters via the right IJ sheath. The tip remains in the right lower pulmonary artery.  Bilateral chest tubes are now in place.  The patient is status post median sternotomy.  The mediastinal drain is in place.  The lung volumes are low.  There is no significant pneumothorax.  A left subclavian line is stable.  IMPRESSION:  1.  Status post median sternotomy. 2.  Interval placement bilateral chest tubes and mediastinal drain without evidence for pneumothorax. 3.  Similar appearance of Swan-Ganz catheter, endotracheal tube, and left subclavian line. 4.  Low lung volumes and bibasilar  atelectasis.   Original Report Authenticated By: Jamesetta Orleans. MATTERN, M.D.    Dg Chest Port 1 View  11/06/2011  *RADIOLOGY REPORT*  Clinical Data:  respiratory failure, ventilatory support  PORTABLE CHEST - 1 VIEW  Comparison: 11/04/2036  Findings: Endotracheal tube 4 cm above the carina.  Left subclavian central line tip in the upper SVC.  Right IJ approach Swan-Ganz catheter tip is in the right lower lobe pulmonary artery. Mediastinal drain and NG tube remain.  Cardiac silhouette is enlarged with stable diffuse edema pattern and basilar atelectasis / consolidation.  Bilateral pleural effusions remain without significant change.  No pneumothorax.  IMPRESSION: Stable support apparatus as described.  Stable cardiomegaly and pulmonary edema with bilateral effusions  Stable basilar atelectasis and dense left lower lobe consolidation   Original Report Authenticated By: Judie Petit. Ruel Favors, M.D.    Dg Chest Port 1 View  11/05/2011  *RADIOLOGY REPORT*  Clinical Data: Intubated.  CABG, PE, chest tube, Vent.  PORTABLE CHEST - 1 VIEW  Comparison: 11/04/2011  Findings: Nasogastric.  Endotracheal tube is in place, tip 5.0 cm above carina.  A Swan-Ganz catheter tip is in place, tip directed inferiorly in the region of the right lower lobe pulmonary artery. Nasogastric tube is in place, tip off the film but beyond the gastroesophageal junction.  A central venous line with inferior approach has its tip overlying the superior vena cava.  Heart is enlarged.  There is dense opacity in the  left lung base. There is pulmonary vascular congestion and mild pulmonary edema. Bilateral pleural effusions are present.  IMPRESSION:  1.  Swan-Ganz catheter tip likely directed to the right lower lobe pulmonary artery. 2.  Cardiomegaly and pulmonary edema. 3.  Dense left lower lobe consolidation and/or atelectasis.   Original Report Authenticated By: Patterson Hammersmith, M.D.     Labs: BMET  Lab 11/06/11 1147 11/06/11 0443 11/05/11 1100  11/05/11 0357 11/04/11 1820 11/04/11 1634 11/04/11 0330 11/03/11 0345  NA 137 141 137 136 134* 137 136 --  K 3.9 4.0 4.0 4.2 4.3 4.4 5.0 --  CL 99 103 99 97 94* 97 95* --  CO2 20 22 23 23 25  -- 21 24  GLUCOSE 119* 98 116* 115* 134* 135* 162* --  BUN 107* 107* 97* 95* 90* 94* 73* --  CREATININE 6.72* 6.59* 6.08* 5.77* 5.40* 4.80* 4.48* --  ALB -- -- -- -- -- -- -- --  CALCIUM 7.7* 7.7* 7.4* 7.5* 7.7* -- 8.0* 8.3*  PHOS -- -- -- 7.1* -- -- -- --   CBC  Lab 11/06/11 1147 11/06/11 0443 11/05/11 0353 11/04/11 1820  WBC 13.2* 11.5* 10.0 10.0  NEUTROABS -- -- -- --  HGB 8.4* 8.3* 8.9* 8.0*  HCT 23.5* 23.6* 25.3* 22.7*  MCV 83.3 84.6 84.3 86.6  PLT 110* 103* 83* 78*    Medications:       . antiseptic oral rinse  15 mL Mouth Rinse QID  . cefTAZidime (FORTAZ)  IV  1 g Intravenous Q8H  . cefUROXime (ZINACEF)  IV  1.5 g Intravenous To OR  . chlorhexidine  60 mL Topical Once  . chlorhexidine  15 mL Mouth Rinse BID  . docusate sodium  200 mg Oral Daily  . DOPamine  2-20 mcg/kg/min Intravenous To OR  . epinephrine  0.5-20 mcg/min Intravenous To OR  . insulin aspart  0-24 Units Subcutaneous Q4H  . nitroGLYCERIN  2-200 mcg/min Intravenous To OR  . sodium chloride 0.9 % 500 mL with vancomycin (VANCOCIN) 1 g infusion   Irrigation To OR  . sodium chloride  10-40 mL Intracatheter Q12H  . sodium chloride  3 mL Intravenous Q12H  . vancomycin  1,500 mg Intravenous Q48H  . vancomycin  1,000 mg Other To OR  . DISCONTD: aminocaproic acid (AMICAR) for OHS   Intravenous To OR  . DISCONTD: aspirin  324 mg Per Tube Daily  . DISCONTD: aspirin EC  325 mg Oral Daily  . DISCONTD: bisacodyl  10 mg Oral Daily  . DISCONTD: bisacodyl  5 mg Oral Once  . DISCONTD: bisacodyl  10 mg Rectal Daily  . DISCONTD: cefTAZidime (FORTAZ)  IV  2 g Intravenous Q24H  . DISCONTD: cefUROXime (ZINACEF)  IV  750 mg Intravenous To OR  . DISCONTD: cisatracurium  10 mg Intravenous Once  . DISCONTD: cisatracurium  10 mg  Intravenous Once  . DISCONTD: dexmedetomidine  0.1-0.7 mcg/kg/hr Intravenous To OR  . DISCONTD: insulin (NOVOLIN-R) infusion   Intravenous To OR  . DISCONTD: magnesium sulfate  40 mEq Other To OR  . DISCONTD: metoprolol tartrate  12.5 mg Per Tube BID  . DISCONTD: metoprolol tartrate  12.5 mg Oral BID  . DISCONTD: nitroglycerin-nicardipine-sodium bicarbonate irrigation for artery spasm   Irrigation To OR  . DISCONTD: nitroglycerin-verapamil-sodium bicarbonate irrigation for artery spasm   Irrigation To OR  . DISCONTD: pantoprazole  40 mg Oral Q1200  . DISCONTD: papaverine-plasmalyte irrigation   Irrigation To OR  . DISCONTD: phenylephrine (  NEO-SYNEPHRINE) Adult infusion  30-200 mcg/min Intravenous To OR  . DISCONTD: potassium chloride  80 mEq Other To OR  . DISCONTD: sodium chloride 0.9 % 500 mL with vancomycin (VANCOCIN) 1 g infusion   Intravenous To OR  . DISCONTD: vancomycin  1,000 mg Intravenous To OR     Assessment/ Plan:   1. Acute kidney injury- likely ATN from IV contrast exposure and hypoperfusion/ischemic injury pre- and post-surgery. Renal function continues to worsen slowly and appears that renal recovery may not be imminent/close to plateau phase. Given his overall comorbidities/need for him to continue recovery, we'll start renal replacement therapy- Will undertake CRRT to limit any further hemodynamic injuries while allowing a more streamlined electrolyte/volume control. Will start with UF goal of -158mL/hr 2. 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 Status post secondary closure of sternal wound/removal of wound Vac earlier this morning. 3. Cardiogenic/hypovolemic (hemorrhagic) shock. Weaning pressors with fairly tolerable BPs- on Epi/Dopamine 4. VDRF w mild pulm edema/central pulmonary vascular congestion appearing stable and probably improved since yesterday 5. Suspected  PE: On bivalrudin due to HIT concerns  Zetta Bills, MD 11/06/2011, 1:18 PM

## 2011-11-06 NOTE — Progress Notes (Signed)
PAP 32/19.

## 2011-11-06 NOTE — Progress Notes (Signed)
ANTICOAGULATION CONSULT NOTE - Follow Up Consult  Pharmacy Consult for bivalirudin Indication: PE and r/o HIT  Labs:  Basename 11/06/11 1147 11/06/11 0443 11/05/11 1811 11/05/11 1100 11/05/11 0554 11/05/11 0353 11/04/11 0330  HGB 8.4* 8.3* -- -- -- -- --  HCT 23.5* 23.6* -- -- -- 25.3* --  PLT 110* 103* -- -- -- 83* --  APTT 40* 54* 54* -- -- -- --  LABPROT -- 21.6* -- -- 26.2* -- 26.5*  INR -- 1.96* -- -- 2.55* -- 2.59*  HEPARINUNFRC -- -- -- -- -- -- --  CREATININE 6.72* 6.59* -- 6.08* -- -- --  CKTOTAL -- -- -- -- -- -- --  CKMB -- -- -- -- -- -- --  TROPONINI -- -- -- -- -- -- --    Assessment:  56yo male remains therapeutic on bivalirudin for PE.  HIT panel still in process.  Angiomax was turned off today for closure of sternal wound. It will be restarted 6hrs post op. His current rate should be adequate as he has been therapeutic on it.    Plan:  Restart Angiomax at 0.015mg /kg/hr at 6pm Check PTT at 8pm

## 2011-11-06 NOTE — Anesthesia Preprocedure Evaluation (Signed)
Anesthesia Evaluation   Patient unresponsive    Reviewed: Patient's Chart, lab work & pertinent test results  Airway       Dental   Pulmonary  Intubated ventilated + rhonchi   + decreased breath sounds      Cardiovascular hypertension, + dysrhythmias     Neuro/Psych    GI/Hepatic (+) Hepatitis -  Endo/Other    Renal/GU      Musculoskeletal   Abdominal   Peds  Hematology   Anesthesia Other Findings   Reproductive/Obstetrics                           Anesthesia Physical Anesthesia Plan  ASA: IV  Anesthesia Plan: General   Post-op Pain Management:    Induction:   Airway Management Planned: Oral ETT  Additional Equipment:   Intra-op Plan:   Post-operative Plan: Post-operative intubation/ventilation  Informed Consent:   Plan Discussed with: CRNA and Surgeon  Anesthesia Plan Comments:         Anesthesia Quick Evaluation

## 2011-11-06 NOTE — Progress Notes (Signed)
Patient transported to OR with restraints on.

## 2011-11-06 NOTE — Progress Notes (Signed)
T. CTS p.m. Rounds  Hemodynamic stable after chest closure today CVVH started with -100 cc balance hourly Bivalirudin infusion started at low dose, PTT pending in 6 hours

## 2011-11-06 NOTE — Brief Op Note (Signed)
10/29/2011 - 11/06/2011  11:47 AM  PATIENT:  Gabriel Tran  56 y.o. male  PRE-OPERATIVE DIAGNOSIS:  Cardiac tamponade; h/o thoracic aneurysm repair, open chest  POST-OPERATIVE DIAGNOSIS:  Cardiac tamponade; h/o thoracic aneurysm repair, open chest  PROCEDURE:  Procedure(s) (LRB) with comments: INSERTION OF DIALYSIS CATHETER (Right) MEDIASTINAL EXPLORATION (N/A) - sternal closure; removal of wound vac  SURGEON:  Surgeon(s) and Role:    * Kerin Perna, MD - Primary  PHYSICIAN ASSISTANT:0   ASSISTANTS: none   ANESTHESIA:   general  EBL:  Total I/O In: 150 [I.V.:150] Out: 125 [Urine:100; Blood:25]  BLOOD ADMINISTERED:none  DRAINS:2 pleural tubes, 2 mediastinal tubes  LOCAL MEDICATIONS USED: 0  SPECIMEN:  No Specimen  DISPOSITION OF SPECIMEN:  N/A  COUNTS:  YES  TOURNIQUET:  0  DICTATION: .pending  PLAN OF CARE: Admit to inpatient   PATIENT DISPOSITION:  To SICU   Delay start of Pharmacological VTE agent (>24hrs) due to surgical blood loss or risk of bleeding: yes

## 2011-11-06 NOTE — Progress Notes (Signed)
Day of Surgery Procedure(s) (LRB): STERNAL WOUND DEBRIDEMENT (N/A) Subjective: Repair typye A dissection with arch repair Postop PE, delayed tamponade ATN , nonoliguric, volume overliaded   Objective: Vital signs in last 24 hours: Temp:  [96.6 F (35.9 C)-99.7 F (37.6 C)] 96.6 F (35.9 C) (10/01 0700) Pulse Rate:  [79-80] 80  (10/01 0700) Cardiac Rhythm:  [-] Atrial paced (10/01 0600) Resp:  [0-24] 14  (10/01 0700) BP: (108-144)/(56-77) 125/74 mmHg (10/01 0700) SpO2:  [91 %-100 %] 97 % (10/01 0700) Arterial Line BP: (113-167)/(60-84) 137/77 mmHg (10/01 0700) FiO2 (%):  [50 %-60 %] 50 % (10/01 0600) Weight:  [270 lb 11.6 oz (122.8 kg)] 270 lb 11.6 oz (122.8 kg) (10/01 0500)  Hemodynamic parameters for last 24 hours: PAP: (27-46)/(13-26) 38/21 mmHg CO:  [6 L/min-6.8 L/min] 6.1 L/min CI:  [2.6 L/min/m2-3 L/min/m2] 2.7 L/min/m2  Intake/Output from previous day: 09/30 0701 - 10/01 0700 In: 3715.3 [I.V.:3090.3; NG/GT:125; IV Piggyback:500] Out: 1590 [Urine:730; Emesis/NG output:400; Drains:450; Chest Tube:10] Intake/Output this shift:    A paced dedated Warm with good pulses  Lab Results:  [facility] 11/06/11 0443 11/05/11 0353  WBC 11.5* 10.0  HGB 8.3* 8.9*  HCT 23.6* 25.3*  PLT 103* 83*   BMET:  Basename 11/06/11 0443 11/05/11 1100  NA 141 137  K 4.0 4.0  CL 103 99  CO2 22 23  GLUCOSE 98 116*  BUN 107* 97*  CREATININE 6.59* 6.08*  CALCIUM 7.7* 7.4*    PT/INR:  Basename 11/06/11 0443  LABPROT 21.6*  INR 1.96*   ABG    Component Value Date/Time   PHART 7.353 11/06/2011 0421   HCO3 23.5 11/06/2011 0421   TCO2 25 11/06/2011 0421   ACIDBASEDEF 2.0 11/06/2011 0421   O2SAT 93.0 11/06/2011 0421   CBG (last 3)   Basename 11/06/11 11/05/11 2019 11/05/11 1541  GLUCAP 94 97 101*    Assessment/Plan: S/P Procedure(s) (LRB): STERNAL WOUND DEBRIDEMENT (N/A) Plan  Close chest Nutrition Cont bival drip for PE, HIT pending   LOS: 8 days    VAN TRIGT  III,PETER 11/06/2011

## 2011-11-07 ENCOUNTER — Inpatient Hospital Stay (HOSPITAL_COMMUNITY): Payer: PRIVATE HEALTH INSURANCE

## 2011-11-07 LAB — TYPE AND SCREEN
ABO/RH(D): A POS
Antibody Screen: NEGATIVE
Unit division: 0
Unit division: 0
Unit division: 0
Unit division: 0
Unit division: 0
Unit division: 0
Unit division: 0
Unit division: 0
Unit division: 0

## 2011-11-07 LAB — RENAL FUNCTION PANEL
Albumin: 2.1 g/dL — ABNORMAL LOW (ref 3.5–5.2)
Albumin: 2.2 g/dL — ABNORMAL LOW (ref 3.5–5.2)
BUN: 72 mg/dL — ABNORMAL HIGH (ref 6–23)
BUN: 52 mg/dL — ABNORMAL HIGH (ref 6–23)
CO2: 24 mEq/L (ref 19–32)
Calcium: 8 mg/dL — ABNORMAL LOW (ref 8.4–10.5)
Calcium: 8.3 mg/dL — ABNORMAL LOW (ref 8.4–10.5)
Chloride: 103 mEq/L (ref 96–112)
Chloride: 106 mEq/L (ref 96–112)
Creatinine, Ser: 4.41 mg/dL — ABNORMAL HIGH (ref 0.50–1.35)
Creatinine, Ser: 3.32 mg/dL — ABNORMAL HIGH (ref 0.50–1.35)
GFR calc Af Amer: 16 mL/min — ABNORMAL LOW (ref >90)
GFR calc non Af Amer: 14 mL/min — ABNORMAL LOW (ref >90)
Glucose, Bld: 140 mg/dL — ABNORMAL HIGH (ref 70–99)
Phosphorus: 5.5 mg/dL — ABNORMAL HIGH (ref 2.3–4.6)
Potassium: 4.3 mEq/L (ref 3.5–5.1)
Sodium: 139 mEq/L (ref 135–145)

## 2011-11-07 LAB — POCT I-STAT 3, ART BLOOD GAS (G3+)
Acid-base deficit: 1 mmol/L (ref 0.0–2.0)
Acid-base deficit: 2 mmol/L (ref 0.0–2.0)
Bicarbonate: 22.2 mEq/L (ref 20.0–24.0)
O2 Saturation: 99 %
O2 Saturation: 98 %
Patient temperature: 36.5
Patient temperature: 36.6
TCO2: 23 mmol/L (ref 0–100)
pH, Arterial: 7.397 (ref 7.350–7.450)

## 2011-11-07 LAB — CULTURE, RESPIRATORY W GRAM STAIN

## 2011-11-07 LAB — APTT
aPTT: 48 seconds — ABNORMAL HIGH (ref 24–37)
aPTT: 49 seconds — ABNORMAL HIGH (ref 24–37)
aPTT: 50 seconds — ABNORMAL HIGH (ref 24–37)
aPTT: 52 seconds — ABNORMAL HIGH (ref 24–37)

## 2011-11-07 LAB — CBC
HCT: 22.3 % — ABNORMAL LOW (ref 39.0–52.0)
MCH: 29.5 pg (ref 26.0–34.0)
MCHC: 35.4 g/dL (ref 30.0–36.0)
RDW: 14.3 % (ref 11.5–15.5)

## 2011-11-07 LAB — POCT I-STAT 4, (NA,K, GLUC, HGB,HCT)
Glucose, Bld: 123 mg/dL — ABNORMAL HIGH (ref 70–99)
Hemoglobin: 9.5 g/dL — ABNORMAL LOW (ref 13.0–17.0)
Potassium: 4.4 mEq/L (ref 3.5–5.1)

## 2011-11-07 LAB — HEPARIN INDUCED THROMBOCYTOPENIA PNL
Heparin Induced Plt Ab: NEGATIVE
Patient O.D.: 0.134
UFH High Dose UFH H: 0 % Release
UFH Low Dose 0.1 IU/mL: 0 % Release
UFH Low Dose 0.5 IU/mL: 0 % Release
UFH SRA Result: NEGATIVE

## 2011-11-07 LAB — GLUCOSE, CAPILLARY
Glucose-Capillary: 107 mg/dL — ABNORMAL HIGH (ref 70–99)
Glucose-Capillary: 83 mg/dL (ref 70–99)
Glucose-Capillary: 85 mg/dL (ref 70–99)
Glucose-Capillary: 89 mg/dL (ref 70–99)

## 2011-11-07 LAB — PREPARE RBC (CROSSMATCH)

## 2011-11-07 LAB — PROTIME-INR: Prothrombin Time: 18.3 seconds — ABNORMAL HIGH (ref 11.6–15.2)

## 2011-11-07 MED ORDER — MILRINONE IN DEXTROSE 200-5 MCG/ML-% IV SOLN
0.2500 ug/kg/min | INTRAVENOUS | Status: DC
  Administered 2011-11-07 – 2011-11-10 (×5): 0.25 ug/kg/min via INTRAVENOUS
  Filled 2011-11-07 (×7): qty 100

## 2011-11-07 MED ORDER — LEVALBUTEROL HCL 1.25 MG/0.5ML IN NEBU
1.2500 mg | INHALATION_SOLUTION | Freq: Four times a day (QID) | RESPIRATORY_TRACT | Status: DC
  Administered 2011-11-07 – 2011-11-10 (×11): 1.25 mg via RESPIRATORY_TRACT
  Filled 2011-11-07 (×17): qty 0.5

## 2011-11-07 NOTE — Progress Notes (Signed)
ANTICOAGULATION CONSULT NOTE - Follow Up Consult  Pharmacy Consult for bivalirudin Indication: PE and r/o HIT  Labs:  Basename 11/07/11 0826 11/07/11 0400 11/06/11 2359 11/06/11 1600 11/06/11 1147 11/06/11 0443 11/05/11 0554  HGB -- 7.9* -- -- 8.4* -- --  HCT -- 22.3* -- -- 23.5* 23.6* --  PLT -- 126* -- -- 110* 103* --  APTT 50* 49* 48* -- -- -- --  LABPROT -- 18.3* -- -- -- 21.6* 26.2*  INR -- 1.57* -- -- -- 1.96* 2.55*  HEPARINUNFRC -- -- -- -- -- -- --  CREATININE -- 4.41* -- 6.56* 6.72* -- --  CKTOTAL -- -- -- -- -- -- --  CKMB -- -- -- -- -- -- --  TROPONINI -- -- -- -- -- -- --    Assessment/Plan:  56yo male now therapeutic on bivalirudin for PE. 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  Cont current rate and recheck level in 12 hours.  Woodfin Ganja PharmD  11/07/2011,9:57 AM

## 2011-11-07 NOTE — Progress Notes (Addendum)
ANTICOAGULATION CONSULT NOTE - Follow Up Consult  Pharmacy Consult for bivalirudin Indication: PE and r/o HIT  Labs:  Basename 11/06/11 2359 11/06/11 1600 11/06/11 1147 11/06/11 0443 11/05/11 0554 11/05/11 0353 11/04/11 0330  HGB -- -- 8.4* 8.3* -- -- --  HCT -- -- 23.5* 23.6* -- 25.3* --  PLT -- -- 110* 103* -- 83* --  APTT 48* -- 40* 54* -- -- --  LABPROT -- -- -- 21.6* 26.2* -- 26.5*  INR -- -- -- 1.96* 2.55* -- 2.59*  HEPARINUNFRC -- -- -- -- -- -- --  CREATININE -- 6.56* 6.72* 6.59* -- -- --  CKTOTAL -- -- -- -- -- -- --  CKMB -- -- -- -- -- -- --  TROPONINI -- -- -- -- -- -- --    Assessment/Plan:  56yo male slightly subtherapeutic on bivalirudin for PE.  HIT panel still in process.  Given narrow goal and previously stable at current rate will continue gtt at current rate for now and recheck level with am labs.  Colleen Can PharmD BCPS 11/07/2011,1:53 AM  Addendum: PTT continues to rise, very close to goal at 49 seconds, will continue for now and check another PTT.   Vernard Gambles, PharmD, BCPS    11/07/2011 5:44 AM

## 2011-11-07 NOTE — Progress Notes (Signed)
No order for vent settings on PRVC. Changed pt back to SIMV. Weaning Nitric.

## 2011-11-07 NOTE — Progress Notes (Signed)
Subjective:  Remains sedated on vent.  Rhythm is atrial paced. Remains on IV amiodarone low dose 15 mg/hr. Remains on pressors.  Objective:  Vital Signs in the last 24 hours: Temp:  [94.6 F (34.8 C)-99.3 F (37.4 C)] 97.9 F (36.6 C) (10/02 0900) Pulse Rate:  [40-99] 88  (10/02 0912) Resp:  [10-29] 29  (10/02 0912) BP: (84-196)/(41-121) 196/87 mmHg (10/02 0912) SpO2:  [93 %-100 %] 93 % (10/02 0912) Arterial Line BP: (83-201)/(40-89) 135/67 mmHg (10/02 0900) FiO2 (%):  [70 %] 70 % (10/02 0912) Weight:  [264 lb 5.3 oz (119.9 kg)] 264 lb 5.3 oz (119.9 kg) (10/02 0500)  Intake/Output from previous day: 10/01 0701 - 10/02 0700 In: 3020.4 [I.V.:2140.4; NG/GT:630; IV Piggyback:250] Out: 5027 [Urine:980; Emesis/NG output:150; Blood:25; Chest Tube:1390] Intake/Output from this shift: Total I/O In: 279.1 [I.V.:189.1; NG/GT:90] Out: 474 [Urine:30; Other:384; Chest Tube:60]     . antiseptic oral rinse  15 mL Mouth Rinse QID  . cefTAZidime (FORTAZ)  IV  2 g Intravenous Q12H  . chlorhexidine  15 mL Mouth Rinse BID  . docusate sodium  200 mg Oral Daily  . insulin aspart  0-24 Units Subcutaneous Q4H  . pantoprazole (PROTONIX) IV  40 mg Intravenous Q24H  . sodium chloride 0.9 % 500 mL with vancomycin (VANCOCIN) 1 g infusion   Irrigation To OR  . sodium chloride  10-40 mL Intracatheter Q12H  . vancomycin  1,000 mg Intravenous Once  . vancomycin  1,000 mg Other To OR  . DISCONTD: aminocaproic acid (AMICAR) for OHS   Intravenous To OR  . DISCONTD: aspirin  324 mg Per Tube Daily  . DISCONTD: aspirin EC  325 mg Oral Daily  . DISCONTD: bisacodyl  10 mg Oral Daily  . DISCONTD: bisacodyl  5 mg Oral Once  . DISCONTD: bisacodyl  10 mg Rectal Daily  . DISCONTD: cefTAZidime (FORTAZ)  IV  1 g Intravenous Q8H  . DISCONTD: cefTAZidime (FORTAZ)  IV  2 g Intravenous Q24H  . DISCONTD: cefTAZidime (FORTAZ)  IV  2 g Intravenous Q24H  . DISCONTD: cefUROXime (ZINACEF)  IV  750 mg Intravenous To OR  .  DISCONTD: cisatracurium  10 mg Intravenous Once  . DISCONTD: dexmedetomidine  0.1-0.7 mcg/kg/hr Intravenous To OR  . DISCONTD: insulin (NOVOLIN-R) infusion   Intravenous To OR  . DISCONTD: magnesium sulfate  40 mEq Other To OR  . DISCONTD: metoprolol tartrate  12.5 mg Per Tube BID  . DISCONTD: metoprolol tartrate  12.5 mg Oral BID  . DISCONTD: nitroglycerin-nicardipine-sodium bicarbonate irrigation for artery spasm   Irrigation To OR  . DISCONTD: nitroglycerin-verapamil-sodium bicarbonate irrigation for artery spasm   Irrigation To OR  . DISCONTD: pantoprazole  40 mg Oral Q1200  . DISCONTD: papaverine-plasmalyte irrigation   Irrigation To OR  . DISCONTD: phenylephrine (NEO-SYNEPHRINE) Adult infusion  30-200 mcg/min Intravenous To OR  . DISCONTD: potassium chloride  80 mEq Other To OR  . DISCONTD: sodium chloride 0.9 % 500 mL with vancomycin (VANCOCIN) 1 g infusion   Intravenous To OR  . DISCONTD: sodium chloride  3 mL Intravenous Q12H  . DISCONTD: vancomycin  1,500 mg Intravenous Q48H      . sodium chloride 20 mL/hr at 11/06/11 0420  . amiodarone (NEXTERONE PREMIX) 360 mg/200 mL dextrose 15 mg/hr (11/07/11 0800)  . bivalirudin (ANGIOMAX) infusion 0.5 mg/mL (Non-ACS indications) 0.015 mg/kg/hr (11/07/11 0800)  . dexmedetomidine 0.8 mcg/kg/hr (11/07/11 0800)  . DOPamine 2.5 mcg/kg/min (11/07/11 0800)  . epinephrine 0.5 mcg/min (11/07/11 0915)  .  feeding supplement (NEPRO CARB STEADY) 1,000 mL (11/06/11 1600)  . nitroGLYCERIN Stopped (11/06/11 1600)  . phenylephrine (NEO-SYNEPHRINE) Adult infusion Stopped (11/04/11 1230)  . dialysis replacement fluid (prismasate) 400 mL/hr at 11/07/11 0506  . dialysis replacement fluid (prismasate) 400 mL/hr at 11/07/11 0509  . dialysate (PRISMASATE) 2,000 mL/hr at 11/07/11 0756  . DISCONTD: sodium chloride 20 mL/hr (11/03/11 2358)  . DISCONTD: sodium chloride 20 mL/hr at 11/04/11 0700  . DISCONTD: sodium chloride 20 mL/hr at 11/06/11 1100  . DISCONTD:  sodium chloride    . DISCONTD: sodium chloride    . DISCONTD: bivalirudin (ANGIOMAX) infusion 0.5 mg/mL (Non-ACS indications) 0.015 mg/kg/hr (11/06/11 0600)  . DISCONTD: fentaNYL infusion INTRAVENOUS 100 mcg/hr (11/05/11 2000)  . DISCONTD: midazolam (VERSED) infusion 2 mg/hr (11/06/11 0600)    Physical Exam: The patient remains sedated on the vent. Chest is clear to auscultation anteriorly The heart reveals distant heart tones.  No aortic insufficiency murmur heard.  No paracardial rub. The abdomen reveals no apparent tenderness. Extremities show 2+ pretibial and ankle edema     Lab Results:  Basename 11/07/11 0400 11/06/11 1147  WBC 13.2* 13.2*  HGB 7.9* 8.4*  PLT 126* 110*    Basename 11/07/11 0400 11/06/11 1600  NA 139 139  K 4.3 4.2  CL 103 101  CO2 24 20  GLUCOSE 140* 116*  BUN 72* 105*  CREATININE 4.41* 6.56*   No results found for this basename: TROPONINI:2,CK,MB:2 in the last 72 hours Hepatic Function Panel  Basename 11/07/11 0400 11/06/11 0443  PROT -- 4.5*  ALBUMIN 2.1* --  AST -- 642*  ALT -- 1679*  ALKPHOS -- 72  BILITOT -- 2.3*  BILIDIR -- --  IBILI -- --   No results found for this basename: CHOL in the last 72 hours No results found for this basename: PROTIME in the last 72 hours  Imaging: Imaging results have been reviewed  Cardiac Studies: Telemetry shows atrial pacing   Assessment/Plan:  Status post type A aortic dissection with initial repair on 10/30/11 and back to the operating room 11/02/12 for Tampanade /hematoma evacuation.  Secondary closure of sternal wound on 11/06/11  Suspected pulmonary emboli currently on bivalirudin.  Acute kidney injury felt to be ATN from hypoperfusion/ischemic injury, currently on CRRT.   No new cardiac recommendations.  We'll continue to follow  LOS: 9 days    Cassell Clement 11/07/2011, 9:25 AM

## 2011-11-07 NOTE — Progress Notes (Addendum)
ANTICOAGULATION CONSULT NOTE - Follow Up Consult  Pharmacy Consult for bivalirudin Indication: PE and r/o HIT  Labs:  Basename 11/07/11 2025 11/07/11 1600 11/07/11 1549 11/07/11 0826 11/07/11 0400 11/06/11 1600 11/06/11 1147 11/06/11 0443 11/05/11 0554  HGB -- -- 9.5* -- 7.9* -- -- -- --  HCT -- -- 28.0* -- 22.3* -- 23.5* -- --  PLT -- -- -- -- 126* -- 110* 103* --  APTT 52* -- -- 50* 49* -- -- -- --  LABPROT -- -- -- -- 18.3* -- -- 21.6* 26.2*  INR -- -- -- -- 1.57* -- -- 1.96* 2.55*  HEPARINUNFRC -- -- -- -- -- -- -- -- --  CREATININE -- 3.32* -- -- 4.41* 6.56* -- -- --  CKTOTAL -- -- -- -- -- -- -- -- --  CKMB -- -- -- -- -- -- -- -- --  TROPONINI -- -- -- -- -- -- -- -- --    Assessment:  56yo male now therapeutic on bivalirudin for PE. HIT panel is negative, plt trending up  Goal aPTT 50-55 sec  Plan: Continue Bivalirudin at current rate F/U aPTT and CBC in Am F/u plans for anticoagulation  Bayard Hugger, PharmD, BCPS  Clinical Pharmacist  Pager: [phone]   11/07/2011,9:17 PM

## 2011-11-07 NOTE — Op Note (Signed)
NAMEMOSIAH, BASTIN NO.:  [id number]  MEDICAL RECORD NO.:  [id number]  LOCATION:  2311                         FACILITY:  [facility]  PHYSICIAN:  Kerin Perna, M.D.  DATE OF BIRTH:  July 23, 1955  DATE OF PROCEDURE:  11/06/2011 DATE OF DISCHARGE:                              OPERATIVE REPORT   OPERATION: 1. Mediastinal reexploration and closure of sternal wound. 2. Placement of hemodialysis catheter, right femoral vein.  SURGEON:  Kerin Perna, M.D.  PREOPERATIVE DIAGNOSIS:  Status post sternal reopening for delayed tamponade 3 days after repair of a type A ascending thoracic aortic dissection.  POSTOPERATIVE DIAGNOSIS:  Status post sternal reopening for delayed tamponade 3 days after repair of a type A ascending thoracic aortic dissection.  ANESTHESIA:  General.  PROCEDURE:  The patient was brought directly from the ICU to the operating room.  He had been supported with a wound VAC system over an open sternal wound.  He had been hemodynamically stable for 48 hours with improved oxygenation and improved ventricular function.  He is brought to the operative for removal of this sternal wound VAC system, irrigation, and sternal closure.  Upon arrival to the OR, the patient was anesthetized.  The wound VAC system was removed and the chest was prepped and draped as a sterile field as well as the abdomen and groins.  The patient remained hemodynamically stable.  A transesophageal 2D echo showed good biventricular function, 1+ aortic insufficiency, and no pericardial effusion.  The mediastinal drains were removed and retracted to the side.  The wound was inspected.  It was a very clean.  The aortic graft and anastomoses were all intact and there was good flow in the aorta and side branch.  The wound was irrigated with saline and vancomycin irrigation.  There was no nonviable tissue.  There was no hematoma.  Both pleural spaces were entered and significant  bilateral pleural effusions measuring 600 mL were removed.  Chest tubes were then placed in both pleural spaces.  The sternum was then closed with interrupted double steel wire.  The sternum closed without hemodynamic changes.  The echo showed continued good ventricular function and cardiac output was 7 L/minute.  The pectoralis fascia was irrigated and then closed with interrupted #1 Vicryl.  The subcutaneous layer was closed in running Vicryl.  The skin was closed with interrupted staples.  The right groin was then accessed.  Using the Seldinger technique, the right femoral vein was cannulated and a guidewire passed.  Over the guidewire, dilators were passed.  The dilator and sheath for the triple- lumen dialysis catheter was positioned and flushed.  It was secured to the skin with several silk sutures and a sterile dressing was applied.  The patient was then transported back to the ICU.  Blood loss was minimal.  The patient remained hemodynamically stable throughout the entire procedure.     Kerin Perna, M.D.     PV/MEDQ  D:  11/06/2011  T:  11/07/2011  Job:  [id number]

## 2011-11-07 NOTE — Progress Notes (Addendum)
Patient ID: Gabriel Tran, male   DOB: 21-Nov-1955, 57 y.o.   MRN: [id number]  SICU Evening Rounds:  Hemodynamically stable  CI= 2.5, PAP 40/21   On dopamine 2.5  Remains on vent but awake  Urine output 15/hr. On CVVHD CT output minimal  CBC    Component Value Date/Time   WBC 13.2* 11/07/2011 0400   RBC 2.68* 11/07/2011 0400   HGB 9.5* 11/07/2011 1549   HCT 28.0* 11/07/2011 1549   PLT 126* 11/07/2011 0400   MCV 83.2 11/07/2011 0400   MCH 29.5 11/07/2011 0400   MCHC 35.4 11/07/2011 0400   RDW 14.3 11/07/2011 0400   LYMPHSABS 1.5 10/29/2011 1601   MONOABS 0.7 10/29/2011 1601   EOSABS 0.1 10/29/2011 1601   BASOSABS 0.0 10/29/2011 1601    BMET    Component Value Date/Time   NA 139 11/07/2011 1600   K 4.4 11/07/2011 1600   CL 106 11/07/2011 1600   CO2 24 11/07/2011 1600   GLUCOSE 124* 11/07/2011 1600   BUN 52* 11/07/2011 1600   CREATININE 3.32* 11/07/2011 1600   CALCIUM 8.3* 11/07/2011 1600   GFRNONAA 20* 11/07/2011 1600   GFRAA 23* 11/07/2011 1600    A/P:    Stable Multi-system organ failure. Continue plan as outlined by Dr. Donata Clay

## 2011-11-07 NOTE — Progress Notes (Signed)
1 Day Post-Op Procedure(s) (LRB): INSERTION OF DIALYSIS CATHETER (Right) MEDIASTINAL EXPLORATION (N/A) Subjective:   Status post repair of type a ascending dissection, arch reconstruction, aortic valve repair one week ago Postoperative pulmonary embolus to right lung on bivalirudin Delayed cardiac tamponade requiring emergency sternotomy, now closed. Hemodynamics stable on inhaled nitric oxide and low-dose dopamine Postop ATN treated with CVVH Chest x-ray shows mild interstitial edema Peripheral pulses intact, neuro appears intact  Objective: Vital signs in last 24 hours: Temp:  [94.6 F (34.8 C)-99.3 F (37.4 C)] 97.9 F (36.6 C) (10/02 0925) Pulse Rate:  [40-99] 88  (10/02 0915) Cardiac Rhythm:  [-] Atrial paced (10/02 0800) Resp:  [10-29] 22  (10/02 0925) BP: (84-196)/(41-121) 196/87 mmHg (10/02 0912) SpO2:  [93 %-100 %] 93 % (10/02 0912) Arterial Line BP: (83-201)/(40-89) 133/58 mmHg (10/02 0925) FiO2 (%):  [70 %] 70 % (10/02 0912) Weight:  [264 lb 5.3 oz (119.9 kg)] 264 lb 5.3 oz (119.9 kg) (10/02 0500)  Hemodynamic parameters for last 24 hours: PAP: (23-43)/(11-32) 34/22 mmHg CVP:  [5 mmHg-17 mmHg] 17 mmHg CO:  [5.2 L/min-7.2 L/min] 7.2 L/min CI:  [2.3 L/min/m2-3.2 L/min/m2] 3.2 L/min/m2  Intake/Output from previous day: 10/01 0701 - 10/02 0700 In: 3020.4 [I.V.:2140.4; NG/GT:630; IV Piggyback:250] Out: 5027 [Urine:980; Emesis/NG output:150; Blood:25; Chest Tube:1390] Intake/Output this shift: Total I/O In: 279.1 [I.V.:189.1; NG/GT:90] Out: 474 [Urine:30; Other:384; Chest Tube:60]  Responsive Lungs clear Extremities warm Abdomen soft  Lab Results:  Basename 11/07/11 0400 11/06/11 1147  WBC 13.2* 13.2*  HGB 7.9* 8.4*  HCT 22.3* 23.5*  PLT 126* 110*   BMET:  Basename 11/07/11 0400 11/06/11 1600  NA 139 139  K 4.3 4.2  CL 103 101  CO2 24 20  GLUCOSE 140* 116*  BUN 72* 105*  CREATININE 4.41* 6.56*  CALCIUM 8.0* 7.5*    PT/INR:  Basename 11/07/11 0400   LABPROT 18.3*  INR 1.57*   ABG    Component Value Date/Time   PHART 7.374 11/07/2011 0422   HCO3 24.2* 11/07/2011 0422   TCO2 25 11/07/2011 0422   ACIDBASEDEF 1.0 11/07/2011 0422   O2SAT 99.0 11/07/2011 0422   CBG (last 3)   Basename 11/07/11 0756 11/07/11 0402 11/07/11 0001  GLUCAP 89 107* 85    Assessment/Plan: S/P Procedure(s) (LRB): INSERTION OF DIALYSIS CATHETER (Right) MEDIASTINAL EXPLORATION (N/A)  Plan-wean off nitric oxide, remove fluid with CVVH and plan to extubate tomorrow. Continue broad-spectrum antibiotics for emergency sternotomy in ICU-cultures all negative to date Will try to start radial a line and remove femoral A-line so patient can be mobilized. Plan removal Swan-Ganz catheter after patient extubated. Hx E. assay still pending    LOS: 9 days    VAN TRIGT III,Kelia Gibbon 11/07/2011

## 2011-11-07 NOTE — Progress Notes (Signed)
RT unable to successfully place radial arterial line after multiple attempts.  Femoral line remains in place.

## 2011-11-07 NOTE — Progress Notes (Signed)
Patient ID: TOMOKI LUCKEN, male   DOB: [birth year], 56 y.o.   MRN: [id number]   [facility] Progress Note    Subjective:   CRRT started yesterday and tolerated fairly overnight with some hypotension- thought to be more likely from sedation.   Objective:   BP 122/66  Pulse 88  Temp 97.7 F (36.5 C) (Core (Comment))  Resp 15  Ht 5\' 8"  (1.727 m)  Wt 119.9 kg (264 lb 5.3 oz)  BMI 40.19 kg/m2  SpO2 100%  Intake/Output Summary (Last 24 hours) at 11/07/11 0751 Last data filed at 11/07/11 0700  Gross per 24 hour  Intake 3020.4 ml  Output   5027 ml  Net -2006.6 ml   Weight change: -2.9 kg (-6 lb 6.3 oz)  Physical Exam: OZH:[id number] resting on the vent  XBM:WUXLK RRR, normal S1 and S2, no gallop/murmur Resp:Coarse/mechanical BS bilaterally GMW:NUUV, obese, NT, BS normal Ext:2+ LE, UE and presacral edema  Imaging: Dg Chest Port 1 View  11/06/2011  *RADIOLOGY REPORT*  Clinical Data: Aortic valve replacement.  Diatek catheter placement.  PORTABLE CHEST - 1 VIEW  Comparison: One-view chest 11/06/2011 at 06:45 a.m.  Findings: The patient is intubated.  Endotracheal tube is stable position.  A Swan-Ganz catheter enters via the right IJ sheath. The tip remains in the right lower pulmonary artery.  Bilateral chest tubes are now in place.  The patient is status post median sternotomy.  The mediastinal drain is in place.  The lung volumes are low.  There is no significant pneumothorax.  A left subclavian line is stable.  IMPRESSION:  1.  Status post median sternotomy. 2.  Interval placement bilateral chest tubes and mediastinal drain without evidence for pneumothorax. 3.  Similar appearance of Swan-Ganz catheter, endotracheal tube, and left subclavian line. 4.  Low lung volumes and bibasilar atelectasis.   Original Report Authenticated By: Jamesetta Orleans. MATTERN, M.D.    Dg Chest Port 1 View  11/06/2011  *RADIOLOGY REPORT*  Clinical Data:  respiratory failure, ventilatory  support  PORTABLE CHEST - 1 VIEW  Comparison: 11/04/2036  Findings: Endotracheal tube 4 cm above the carina.  Left subclavian central line tip in the upper SVC.  Right IJ approach Swan-Ganz catheter tip is in the right lower lobe pulmonary artery. Mediastinal drain and NG tube remain.  Cardiac silhouette is enlarged with stable diffuse edema pattern and basilar atelectasis / consolidation.  Bilateral pleural effusions remain without significant change.  No pneumothorax.  IMPRESSION: Stable support apparatus as described.  Stable cardiomegaly and pulmonary edema with bilateral effusions  Stable basilar atelectasis and dense left lower lobe consolidation   Original Report Authenticated By: Judie Petit. Ruel Favors, M.D.    Dg Chest Port 1 View  11/05/2011  *RADIOLOGY REPORT*  Clinical Data: Intubated.  CABG, PE, chest tube, Vent.  PORTABLE CHEST - 1 VIEW  Comparison: 11/04/2011  Findings: Nasogastric.  Endotracheal tube is in place, tip 5.0 cm above carina.  A Swan-Ganz catheter tip is in place, tip directed inferiorly in the region of the right lower lobe pulmonary artery. Nasogastric tube is in place, tip off the film but beyond the gastroesophageal junction.  A central venous line with inferior approach has its tip overlying the superior vena cava.  Heart is enlarged.  There is dense opacity in the left lung base. There is pulmonary vascular congestion and mild pulmonary edema. Bilateral pleural effusions are present.  IMPRESSION:  1.  Swan-Ganz catheter tip likely directed to the right lower lobe pulmonary  artery. 2.  Cardiomegaly and pulmonary edema. 3.  Dense left lower lobe consolidation and/or atelectasis.   Original Report Authenticated By: Patterson Hammersmith, M.D.     Labs: BMET  Lab 11/07/11 0400 11/06/11 1600 11/06/11 1147 11/06/11 0443 11/05/11 1100 11/05/11 0357 11/04/11 1820  NA 139 139 137 141 137 136 134*  K 4.3 4.2 3.9 4.0 4.0 4.2 4.3  CL 103 101 99 103 99 97 94*  CO2 24 20 20 22 23 23 25     GLUCOSE 140* 116* 119* 98 116* 115* 134*  BUN 72* 105* 107* 107* 97* 95* 90*  CREATININE 4.41* 6.56* 6.72* 6.59* 6.08* 5.77* 5.40*  ALB -- -- -- -- -- -- --  CALCIUM 8.0* 7.5* 7.7* 7.7* 7.4* 7.5* 7.7*  PHOS 5.5* 7.7* -- -- -- 7.1* --   CBC  Lab 11/07/11 0400 11/06/11 1147 11/06/11 0443 11/05/11 0353  WBC 13.2* 13.2* 11.5* 10.0  NEUTROABS -- -- -- --  HGB 7.9* 8.4* 8.3* 8.9*  HCT 22.3* 23.5* 23.6* 25.3*  MCV 83.2 83.3 84.6 84.3  PLT 126* 110* 103* 83*    Medications:      . antiseptic oral rinse  15 mL Mouth Rinse QID  . cefTAZidime (FORTAZ)  IV  2 g Intravenous Q12H  . cefUROXime (ZINACEF)  IV  1.5 g Intravenous To OR  . chlorhexidine  15 mL Mouth Rinse BID  . docusate sodium  200 mg Oral Daily  . insulin aspart  0-24 Units Subcutaneous Q4H  . pantoprazole (PROTONIX) IV  40 mg Intravenous Q24H  . sodium chloride 0.9 % 500 mL with vancomycin (VANCOCIN) 1 g infusion   Irrigation To OR  . sodium chloride  10-40 mL Intracatheter Q12H  . vancomycin  1,000 mg Intravenous Once  . vancomycin  1,000 mg Other To OR  . DISCONTD: aminocaproic acid (AMICAR) for OHS   Intravenous To OR  . DISCONTD: aspirin  324 mg Per Tube Daily  . DISCONTD: aspirin EC  325 mg Oral Daily  . DISCONTD: bisacodyl  10 mg Oral Daily  . DISCONTD: bisacodyl  5 mg Oral Once  . DISCONTD: bisacodyl  10 mg Rectal Daily  . DISCONTD: cefTAZidime (FORTAZ)  IV  1 g Intravenous Q8H  . DISCONTD: cefTAZidime (FORTAZ)  IV  2 g Intravenous Q24H  . DISCONTD: cefTAZidime (FORTAZ)  IV  2 g Intravenous Q24H  . DISCONTD: cefUROXime (ZINACEF)  IV  750 mg Intravenous To OR  . DISCONTD: cisatracurium  10 mg Intravenous Once  . DISCONTD: dexmedetomidine  0.1-0.7 mcg/kg/hr Intravenous To OR  . DISCONTD: insulin (NOVOLIN-R) infusion   Intravenous To OR  . DISCONTD: magnesium sulfate  40 mEq Other To OR  . DISCONTD: metoprolol tartrate  12.5 mg Per Tube BID  . DISCONTD: metoprolol tartrate  12.5 mg Oral BID  . DISCONTD:  nitroglycerin-nicardipine-sodium bicarbonate irrigation for artery spasm   Irrigation To OR  . DISCONTD: nitroglycerin-verapamil-sodium bicarbonate irrigation for artery spasm   Irrigation To OR  . DISCONTD: pantoprazole  40 mg Oral Q1200  . DISCONTD: papaverine-plasmalyte irrigation   Irrigation To OR  . DISCONTD: phenylephrine (NEO-SYNEPHRINE) Adult infusion  30-200 mcg/min Intravenous To OR  . DISCONTD: potassium chloride  80 mEq Other To OR  . DISCONTD: sodium chloride 0.9 % 500 mL with vancomycin (VANCOCIN) 1 g infusion   Intravenous To OR  . DISCONTD: sodium chloride  3 mL Intravenous Q12H  . DISCONTD: vancomycin  1,500 mg Intravenous Q48H  . DISCONTD: vancomycin  1,000 mg Intravenous To OR     Assessment/ Plan:   1. Acute kidney injury- likely ATN from CINP and hypoperfusion/ischemic injury pre- and post-surgery.Tolerating CRRT with net UF of 167mL/hr without major problems- anticipate circuit time to be kept prolonged by angiomax use. Urine output marginal and no clinical evidence of renal recovery as yet- will continue CRRT at current prescription and plan to increase UF rate later today based on hemodynamic parameters. 2. 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 Status post secondary closure of sternal wound/removal of wound Vac yesterday. 3. Cardiogenic/hypovolemic (hemorrhagic) shock. On pressors with fairly tolerable BPs in spite of UF- on Epi/Dopamine 4. VDRF w mild pulm edema/central pulmonary vascular congestion- anticipated to improve on UF with CRRT 5. Suspected PE: On bivalrudin due to HIT concerns  Zetta Bills, MD 11/07/2011, 7:51 AM

## 2011-11-07 NOTE — Progress Notes (Signed)
Nitiric discontinued.t tolerated well.

## 2011-11-08 ENCOUNTER — Inpatient Hospital Stay (HOSPITAL_COMMUNITY): Payer: PRIVATE HEALTH INSURANCE

## 2011-11-08 ENCOUNTER — Encounter (HOSPITAL_COMMUNITY): Payer: Self-pay | Admitting: Cardiothoracic Surgery

## 2011-11-08 LAB — COMPREHENSIVE METABOLIC PANEL
ALT: 908 U/L — ABNORMAL HIGH (ref 0–53)
AST: 215 U/L — ABNORMAL HIGH (ref 0–37)
Albumin: 2 g/dL — ABNORMAL LOW (ref 3.5–5.2)
Alkaline Phosphatase: 91 U/L (ref 39–117)
BUN: 42 mg/dL — ABNORMAL HIGH (ref 6–23)
CO2: 25 mEq/L (ref 19–32)
Calcium: 8 mg/dL — ABNORMAL LOW (ref 8.4–10.5)
Chloride: 105 mEq/L (ref 96–112)
Creatinine, Ser: 2.82 mg/dL — ABNORMAL HIGH (ref 0.50–1.35)
GFR calc Af Amer: 28 mL/min — ABNORMAL LOW (ref >90)
GFR calc non Af Amer: 24 mL/min — ABNORMAL LOW (ref >90)
Glucose, Bld: 167 mg/dL — ABNORMAL HIGH (ref 70–99)
Potassium: 4 mEq/L (ref 3.5–5.1)
Sodium: 138 mEq/L (ref 135–145)
Total Bilirubin: 3.1 mg/dL — ABNORMAL HIGH (ref 0.3–1.2)
Total Protein: 4.9 g/dL — ABNORMAL LOW (ref 6.0–8.3)

## 2011-11-08 LAB — GLUCOSE, CAPILLARY
Glucose-Capillary: 106 mg/dL — ABNORMAL HIGH (ref 70–99)
Glucose-Capillary: 68 mg/dL — ABNORMAL LOW (ref 70–99)
Glucose-Capillary: 128 mg/dL — ABNORMAL HIGH (ref 70–99)
Glucose-Capillary: 94 mg/dL (ref 70–99)

## 2011-11-08 LAB — POCT I-STAT 3, ART BLOOD GAS (G3+)
Acid-Base Excess: 2 mmol/L (ref 0.0–2.0)
Bicarbonate: 24.7 mEq/L — ABNORMAL HIGH (ref 20.0–24.0)
Patient temperature: 98.6
TCO2: 26 mmol/L (ref 0–100)
pH, Arterial: 7.417 (ref 7.350–7.450)
pH, Arterial: 7.471 — ABNORMAL HIGH (ref 7.350–7.450)
pO2, Arterial: 71 mmHg — ABNORMAL LOW (ref 80.0–100.0)

## 2011-11-08 LAB — RENAL FUNCTION PANEL
Albumin: 2.2 g/dL — ABNORMAL LOW (ref 3.5–5.2)
CO2: 24 mEq/L (ref 19–32)
Chloride: 104 mEq/L (ref 96–112)
GFR calc non Af Amer: 30 mL/min — ABNORMAL LOW (ref >90)
Potassium: 4.2 mEq/L (ref 3.5–5.1)

## 2011-11-08 LAB — CBC
HCT: 22.2 % — ABNORMAL LOW (ref 39.0–52.0)
Platelets: 118 10*3/uL — ABNORMAL LOW (ref 150–400)
RBC: 2.68 MIL/uL — ABNORMAL LOW (ref 4.22–5.81)
RDW: 15.9 % — ABNORMAL HIGH (ref 11.5–15.5)
WBC: 11.8 10*3/uL — ABNORMAL HIGH (ref 4.0–10.5)

## 2011-11-08 LAB — PROTIME-INR
INR: 1.5 — ABNORMAL HIGH (ref 0.00–1.49)
Prothrombin Time: 17.7 seconds — ABNORMAL HIGH (ref 11.6–15.2)

## 2011-11-08 LAB — MAGNESIUM: Magnesium: 2.6 mg/dL — ABNORMAL HIGH (ref 1.5–2.5)

## 2011-11-08 LAB — APTT
aPTT: 50 seconds — ABNORMAL HIGH (ref 24–37)
aPTT: 41 seconds — ABNORMAL HIGH (ref 24–37)
aPTT: 50 seconds — ABNORMAL HIGH (ref 24–37)

## 2011-11-08 LAB — PREPARE RBC (CROSSMATCH)

## 2011-11-08 LAB — PHOSPHORUS: Phosphorus: 3.4 mg/dL (ref 2.3–4.6)

## 2011-11-08 MED ORDER — DEXMEDETOMIDINE HCL IN NACL 200 MCG/50ML IV SOLN
INTRAVENOUS | Status: AC
  Administered 2011-11-08: 200 ug
  Filled 2011-11-08: qty 50

## 2011-11-08 MED ORDER — DEXMEDETOMIDINE HCL IN NACL 400 MCG/100ML IV SOLN
0.4000 ug/kg/h | INTRAVENOUS | Status: DC
  Administered 2011-11-08 – 2011-11-09 (×3): 0.8 ug/kg/h via INTRAVENOUS
  Administered 2011-11-09: 1 ug/kg/h via INTRAVENOUS
  Administered 2011-11-09 – 2011-11-10 (×2): 0.4 ug/kg/h via INTRAVENOUS
  Administered 2011-11-10: 0.9 ug/kg/h via INTRAVENOUS
  Filled 2011-11-08 (×4): qty 100
  Filled 2011-11-08: qty 200
  Filled 2011-11-08 (×2): qty 100

## 2011-11-08 MED ORDER — DEXTROSE 50 % IV SOLN
25.0000 mL | Freq: Once | INTRAVENOUS | Status: AC | PRN
  Administered 2011-11-08 – 2011-11-09 (×2): 25 mL via INTRAVENOUS

## 2011-11-08 MED ORDER — FENTANYL CITRATE 0.05 MG/ML IJ SOLN
INTRAMUSCULAR | Status: AC
  Filled 2011-11-08: qty 2

## 2011-11-08 MED ORDER — DEXTROSE 50 % IV SOLN
INTRAVENOUS | Status: AC
  Filled 2011-11-08: qty 50

## 2011-11-08 MED ORDER — DEXMEDETOMIDINE HCL IN NACL 200 MCG/50ML IV SOLN
0.8000 ug/kg/h | INTRAVENOUS | Status: DC
  Administered 2011-11-08: 0.8 ug/kg/h via INTRAVENOUS
  Filled 2011-11-08 (×2): qty 50

## 2011-11-08 NOTE — Progress Notes (Addendum)
[address]            [place] [address]          [phone]         2 Days Post-Op Procedure(s) (LRB): INSERTION OF DIALYSIS CATHETER (Right) MEDIASTINAL EXPLORATION (N/A)  Total Length of Stay:  LOS: 10 days   Subjective: Alert on the vent, responding to simple questions  Objective: Vital signs in last 24 hours: Temp:  [96.4 F (35.8 C)-97.9 F (36.6 C)] 96.4 F (35.8 C) (10/03 0900) Pulse Rate:  [78-99] 88  (10/03 0900) Cardiac Rhythm:  [-] Atrial paced (10/03 0400) Resp:  [10-26] 17  (10/03 0900) BP: (87-183)/(42-85) 106/49 mmHg (10/03 0900) SpO2:  [90 %-100 %] 100 % (10/03 0900) Arterial Line BP: (66-200)/(35-99) 102/50 mmHg (10/03 0900) FiO2 (%):  [50 %-70 %] 50 % (10/03 0750) Weight:  [263 lb 0.1 oz (119.3 kg)] 263 lb 0.1 oz (119.3 kg) (10/03 0500)  Filed Weights   11/06/11 0500 11/07/11 0500 11/08/11 0500  Weight: 270 lb 11.6 oz (122.8 kg) 264 lb 5.3 oz (119.9 kg) 263 lb 0.1 oz (119.3 kg)    Weight change: -1 lb 5.2 oz (-0.6 kg)   Hemodynamic parameters for last 24 hours: PAP: (21-48)/(13-29) 35/25 mmHg CVP:  [5 mmHg-18 mmHg] 16 mmHg CO:  [5 L/min-6.5 L/min] 6.5 L/min CI:  [2.2 L/min/m2-2.8 L/min/m2] 2.8 L/min/m2  Intake/Output from previous day: 10/02 0701 - 10/03 0700 In: 3579.7 [I.V.:2270.5; Blood:329.2; NG/GT:870; IV Piggyback:110] Out: 6161 [Urine:320; Chest Tube:500]  Intake/Output this shift:    Current Meds: Scheduled Meds:   . antiseptic oral rinse  15 mL Mouth Rinse QID  . cefTAZidime (FORTAZ)  IV  2 g Intravenous Q12H  . chlorhexidine  15 mL Mouth Rinse BID  . docusate sodium  200 mg Oral Daily  . insulin aspart  0-24 Units Subcutaneous Q4H  . levalbuterol  1.25 mg Nebulization Q6H  . pantoprazole (PROTONIX) IV  40 mg Intravenous Q24H  . sodium chloride  10-40 mL Intracatheter Q12H   Continuous Infusions:   . sodium chloride 20 mL/hr at 11/08/11 0316  . amiodarone (NEXTERONE PREMIX) 360 mg/200  mL dextrose 15 mg/hr (11/07/11 1123)  . bivalirudin (ANGIOMAX) infusion 0.5 mg/mL (Non-ACS indications) 0.015 mg/kg/hr (11/08/11 0106)  . DOPamine 2.5 mcg/kg/min (11/07/11 0800)  . epinephrine Stopped (11/07/11 1130)  . milrinone 0.25 mcg/kg/min (11/08/11 0316)  . nitroGLYCERIN Stopped (11/06/11 1600)  . phenylephrine (NEO-SYNEPHRINE) Adult infusion Stopped (11/04/11 1230)  . dialysis replacement fluid (prismasate) 400 mL/hr at 11/07/11 1832  . dialysis replacement fluid (prismasate) 400 mL/hr at 11/07/11 1837  . dialysate (PRISMASATE) 2,000 mL/hr at 11/08/11 0316  . DISCONTD: dexmedetomidine 0.8 mcg/kg/hr (11/08/11 0622)  . DISCONTD: feeding supplement (NEPRO CARB STEADY) 1,000 mL (11/07/11 1854)   PRN Meds:.fentaNYL, metoprolol, midazolam, morphine injection, ondansetron (ZOFRAN) IV, ondansetron, oxyCODONE, sodium chloride, sodium chloride  General appearance: alert Heart: regular rate and rhythm Lungs: clear anteriorly Abdomen: + distension, mild diffuse ttp Extremities: + edma Wound: incisions stable  Lab Results: CBC: Basename 11/08/11 0450 11/07/11 1549 11/07/11 0400  WBC 11.8* -- 13.2*  HGB 7.7* 9.5* --  HCT 22.2* 28.0* --  PLT 118* -- 126*   BMET:  Basename 11/08/11 0450 11/07/11 1600  NA 138 139  K 4.0 4.4  CL 105 106  CO2 25 24  GLUCOSE 167* 124*  BUN 42* 52*  CREATININE  2.82* 3.32*  CALCIUM 8.0* 8.3*    PT/INR:  Basename 11/08/11 0450  LABPROT 17.7*  INR 1.50*   Radiology: Dg Chest Port 1 View  11/08/2011  *RADIOLOGY REPORT*  Clinical Data: Thoracic aneurysm repair, cardiac tamponade.  PORTABLE CHEST - 1 VIEW  Comparison: 11/07/2011; 11/06/2011; chest CT - 10/30/2011  Findings:  Grossly unchanged enlarged cardiac silhouette and mediastinal contours.  Stable position of support apparatus including right jugular approach PA catheter tip overlying expected location of the right interlobar pulmonary artery.  Lung volumes remain persistently reduced.  Grossly  unchanged trace bilateral effusions and bibasilar opacities.  No focal airspace opacity.  Unchanged bones.  Skin staples overlie the right axilla.  IMPRESSION: 1.  Stable positioning of support apparatus.  No pneumothorax. 2.  Right jugular approach PA catheter tip remains overlying the right intralobar pulmonary artery. 3.  Persistently reduced lung volumes with likely small bilateral effusions and bibasilar opacities, likely atelectasis.   Original Report Authenticated By: Waynard Reeds, M.D.    Dg Chest Port 1 View  11/07/2011  *RADIOLOGY REPORT*  Clinical Data:  aortic valve replacement  PORTABLE CHEST - 1 VIEW  Comparison: 11/06/2011  Findings: Stable position of endotracheal tube with tip several centimeters above the carina. Nasogastric extends into the stomach.  The tip of the tube is not on the film.  Stable mediastinal drains.  Stable bilateral thoracic tubes.  Stable pulmonary artery catheter and left subclavian central intravenous line.  No pneumothoraces.  The lungs are clear. No interval changes.  IMPRESSION: No pneumothoraces.  No significant interval changes.  No focal abnormalities.   Original Report Authenticated By: Mervin Hack, M.D.    Dg Chest Port 1 View  11/06/2011  *RADIOLOGY REPORT*  Clinical Data: Aortic valve replacement.  Diatek catheter placement.  PORTABLE CHEST - 1 VIEW  Comparison: One-view chest 11/06/2011 at 06:45 a.m.  Findings: The patient is intubated.  Endotracheal tube is stable position.  A Swan-Ganz catheter enters via the right IJ sheath. The tip remains in the right lower pulmonary artery.  Bilateral chest tubes are now in place.  The patient is status post median sternotomy.  The mediastinal drain is in place.  The lung volumes are low.  There is no significant pneumothorax.  A left subclavian line is stable.  IMPRESSION:  1.  Status post median sternotomy. 2.  Interval placement bilateral chest tubes and mediastinal drain without evidence for pneumothorax.  3.  Similar appearance of Swan-Ganz catheter, endotracheal tube, and left subclavian line. 4.  Low lung volumes and bibasilar atelectasis.   Original Report Authenticated By: Jamesetta Orleans. MATTERN, M.D.      Assessment/Plan: S/P Procedure(s) (LRB): INSERTION OF DIALYSIS CATHETER (Right) MEDIASTINAL EXPLORATION (N/A)  1. Remains critically ill, but steadily improving 2  Renal fxn improving from lab viewpoint but UO marginal , as per consult 3 wean pressors as hemodynamics allow, epi off , on milrinone with NO off- PAP good 4 wean vent as able with PS/Cpap 5 on angiomax for suspected PE 6 will need significant rehab modalities when more stable 7 will need to consider nutrition modality     GOLD,WAYNE E 11/08/2011 9:25 AM   Not ready for extubation- PO2 61 on .50 Fio2 Will DC swan- 60 days old Resume TF and cont to remove fluid w/ CVVH

## 2011-11-08 NOTE — Progress Notes (Signed)
ANTICOAGULATION CONSULT NOTE - Follow Up Consult  Pharmacy Consult for bivalirudin Indication: PE and r/o HIT  Labs:  Basename 11/08/11 0450 11/07/11 2025 11/07/11 1600 11/07/11 1549 11/07/11 0826 11/07/11 0400 11/06/11 1147 11/06/11 0443  HGB 7.7* -- -- 9.5* -- -- -- --  HCT 22.2* -- -- 28.0* -- 22.3* -- --  PLT 118* -- -- -- -- 126* 110* --  APTT 50* 52* -- -- 50* -- -- --  LABPROT 17.7* -- -- -- -- 18.3* -- 21.6*  INR 1.50* -- -- -- -- 1.57* -- 1.96*  HEPARINUNFRC -- -- -- -- -- -- -- --  CREATININE 2.82* -- 3.32* -- -- 4.41* -- --  CKTOTAL -- -- -- -- -- -- -- --  CKMB -- -- -- -- -- -- -- --  TROPONINI -- -- -- -- -- -- -- --    Assessment:  56yo male now therapeutic on bivalirudin for PE. HIT panel is negative, plt trending up  Goal aPTT 50-55 sec  Plan: Continue Bivalirudin at current rate F/U aPTT in 12 hours F/u plans for anticoagulation  Lacretia Nicks, PharmD Clinical Pharmacist  Pager: [phone]   11/08/2011,8:56 AM

## 2011-11-08 NOTE — Progress Notes (Signed)
ANTICOAGULATION CONSULT NOTE - Follow Up Consult  Pharmacy Consult for bivalirudin Indication: PE and r/o HIT  Labs:  Basename 11/08/11 1732 11/08/11 0450 11/07/11 2025 11/07/11 1600 11/07/11 1549 11/07/11 0400 11/06/11 1147 11/06/11 0443  HGB -- 7.7* -- -- 9.5* -- -- --  HCT -- 22.2* -- -- 28.0* 22.3* -- --  PLT -- 118* -- -- -- 126* 110* --  APTT 41* 50* 52* -- -- -- -- --  LABPROT -- 17.7* -- -- -- 18.3* -- 21.6*  INR -- 1.50* -- -- -- 1.57* -- 1.96*  HEPARINUNFRC -- -- -- -- -- -- -- --  CREATININE -- 2.82* -- 3.32* -- 4.41* -- --  CKTOTAL -- -- -- -- -- -- -- --  CKMB -- -- -- -- -- -- -- --  TROPONINI -- -- -- -- -- -- -- --    Assessment:  56yo male now therapeutic on bivalirudin for PE. HIT panel is negative, plt trending up.  Bivalirudin infusing without problems at 0.015mg /kg/hr, patient received a unit of blood at 1400, and had a femoral line without problems this afternoon.   Now aPTT is subtherapeutic after several days of therapeutic at this rate.     Goal aPTT 50-55 sec  Plan: Continue Bivalirudin at current rate, anticipate aPTT will rise after more time from blood transfusion F/U aPTT at 8p F/u plans for anticoagulation   Thank you for allowing pharmacy to be a part of this patients care team.  Lovenia Kim Pharm.D., BCPS Clinical Pharmacist 11/08/2011 6:13 PM Pager: [phone] Phone: [phone]    11/08/2011,6:12 PM

## 2011-11-08 NOTE — Progress Notes (Signed)
ANTICOAGULATION CONSULT NOTE - Follow Up Consult  Pharmacy Consult for bivalirudin Indication: PE and r/o HIT  Labs:  Basename 11/08/11 1945 11/08/11 1732 11/08/11 0450 11/07/11 1600 11/07/11 1549 11/07/11 0400 11/06/11 1147 11/06/11 0443  HGB -- -- 7.7* -- 9.5* -- -- --  HCT -- -- 22.2* -- 28.0* 22.3* -- --  PLT -- -- 118* -- -- 126* 110* --  APTT 50* 41* 50* -- -- -- -- --  LABPROT -- -- 17.7* -- -- 18.3* -- 21.6*  INR -- -- 1.50* -- -- 1.57* -- 1.96*  HEPARINUNFRC -- -- -- -- -- -- -- --  CREATININE -- -- 2.82* 3.32* -- 4.41* -- --  CKTOTAL -- -- -- -- -- -- -- --  CKMB -- -- -- -- -- -- -- --  TROPONINI -- -- -- -- -- -- -- --    Assessment:  56yo male now therapeutic on bivalirudin for PE. HIT panel is negative, plt trending up.  Bivalirudin infusing without problems at 0.015mg /kg/hr, patient received a unit of blood earlier today.  A repeat aPTT today is within goal (aPTT=50)  Goal aPTT 50-55 sec  Plan:  -No bivalirudin dose changes needed -Platelet antibody and SRA negative- Consider re-starting heparin?  Thank you for allowing pharmacy to be a part of this patients care team.  Harland German, Pharm D 11/08/2011 8:27 PM

## 2011-11-08 NOTE — Progress Notes (Addendum)
Nutrition Follow-up  Intervention:   Recommend EN initiation of Osmolite 1.5 formula at 25 ml/hr with Prostat liquid protein 30 ml 6 times daily via tube to provide 1500 kcals (71% of estimated kcal needs), 128 gm protein (98% of estimated protein needs), 457 ml of free water  Liquid MVI via tube daily RD to follow for nutrition care plan  Assessment:   Patient remains intubated on ventilator support MV: 12.4 Temp: 36.1  S/p re-exploration and closure of sternal wound and placement of hemodialysis catheter, right femoral vein 10/1. Initiated on CVVHD 10/1. OGT in place. No nutrition x 5 days. RD spoke with Gershon Crane, PA-C.  Diet Order:  NPO  Meds: Scheduled Meds:   . antiseptic oral rinse  15 mL Mouth Rinse QID  . cefTAZidime (FORTAZ)  IV  2 g Intravenous Q12H  . chlorhexidine  15 mL Mouth Rinse BID  . docusate sodium  200 mg Oral Daily  . insulin aspart  0-24 Units Subcutaneous Q4H  . levalbuterol  1.25 mg Nebulization Q6H  . pantoprazole (PROTONIX) IV  40 mg Intravenous Q24H  . sodium chloride  10-40 mL Intracatheter Q12H   Continuous Infusions:   . sodium chloride 20 mL/hr at 11/08/11 0316  . amiodarone (NEXTERONE PREMIX) 360 mg/200 mL dextrose 15 mg/hr (11/08/11 1049)  . bivalirudin (ANGIOMAX) infusion 0.5 mg/mL (Non-ACS indications) 0.015 mg/kg/hr (11/08/11 0106)  . DOPamine 2.5 mcg/kg/min (11/07/11 0800)  . epinephrine Stopped (11/07/11 1130)  . milrinone 0.25 mcg/kg/min (11/08/11 0316)  . nitroGLYCERIN Stopped (11/06/11 1600)  . phenylephrine (NEO-SYNEPHRINE) Adult infusion Stopped (11/04/11 1230)  . dialysis replacement fluid (prismasate) 400 mL/hr at 11/08/11 0830  . dialysis replacement fluid (prismasate) 400 mL/hr at 11/07/11 1837  . dialysate (PRISMASATE) 2,000 mL/hr at 11/08/11 0316  . DISCONTD: dexmedetomidine 0.8 mcg/kg/hr (11/08/11 0622)  . DISCONTD: feeding supplement (NEPRO CARB STEADY) 1,000 mL (11/07/11 1854)   PRN Meds:.fentaNYL, metoprolol, midazolam,  morphine injection, ondansetron (ZOFRAN) IV, ondansetron, oxyCODONE, sodium chloride, sodium chloride  Labs:  CMP     Component Value Date/Time   NA 138 11/08/2011 0450   K 4.0 11/08/2011 0450   CL 105 11/08/2011 0450   CO2 25 11/08/2011 0450   GLUCOSE 167* 11/08/2011 0450   BUN 42* 11/08/2011 0450   CREATININE 2.82* 11/08/2011 0450   CALCIUM 8.0* 11/08/2011 0450   PROT 4.9* 11/08/2011 0450   ALBUMIN 2.0* 11/08/2011 0450   AST 215* 11/08/2011 0450   ALT 908* 11/08/2011 0450   ALKPHOS 91 11/08/2011 0450   BILITOT 3.1* 11/08/2011 0450   GFRNONAA 24* 11/08/2011 0450   GFRAA 28* 11/08/2011 0450    Phosphorus  Date Value Range Status  11/08/2011 3.4  2.3 - 4.6 mg/dL Final    Magnesium  Date Value Range Status  11/08/2011 2.6* 1.5 - 2.5 mg/dL Final     Intake/Output Summary (Last 24 hours) at 11/08/11 1118 Last data filed at 11/08/11 1100  Gross per 24 hour  Intake 3181.58 ml  Output   6220 ml  Net -3038.42 ml    CBG (last 3)   Basename 11/08/11 0806 11/08/11 0407 11/08/11 0002  GLUCAP 106* 105* 94    Weight Status:  119.3 kg (10/3) -- fluctuating  Re-estimated needs:  2100 kcals, 130-140 gm protein  Nutrition Dx:  Inadequate Oral Intake r/t inability to eat as evidenced by NPO status, ongoing  Goal:  Initiate EN support in next 24 hours if prolonged intubation expected, unmet  Monitor:  EN initiation, respiratory status, weight,  labs, I/O's  Kirkland Hun, RD, LDN Pager #: [phone] After-Hours Pager #: [phone]

## 2011-11-08 NOTE — Progress Notes (Signed)
Following at a distance.  Gradual improvement.

## 2011-11-08 NOTE — Progress Notes (Signed)
Per Osker Mason put pt back on full support on PRVC with prev setting except peep 5. Pt failed wean due to > rr  and ABG.

## 2011-11-08 NOTE — Progress Notes (Signed)
Patient ID: Gabriel Tran, male   DOB: 11/26/1955, 56 y.o.   MRN: [id number]   [facility] Progress Note    Subjective:   No acute events overnight- tolerating UF of around -181mL/hr without problems   Objective:   BP 111/58  Pulse 87  Temp 96.8 F (36 C) (Core (Comment))  Resp 22  Ht 5\' 8"  (1.727 m)  Wt 119.3 kg (263 lb 0.1 oz)  BMI 39.99 kg/m2  SpO2 100%  Intake/Output Summary (Last 24 hours) at 11/08/11 0732 Last data filed at 11/08/11 0700  Gross per 24 hour  Intake 3579.65 ml  Output   5895 ml  Net -2315.35 ml   Weight change: -0.6 kg (-1 lb 5.2 oz)  Physical Exam: WUJ:[id number]/NFAOZ and alert- nods to questions  HYQ:MVHQI RRR, normal S1 and S2  Resp:Mechanical BS bilaterally, no rales ONG:EXBM, obese, NT, BS normal Ext:2+ LE/UE edema  Imaging: Dg Chest Port 1 View  11/07/2011  *RADIOLOGY REPORT*  Clinical Data:  aortic valve replacement  PORTABLE CHEST - 1 VIEW  Comparison: 11/06/2011  Findings: Stable position of endotracheal tube with tip several centimeters above the carina. Nasogastric extends into the stomach.  The tip of the tube is not on the film.  Stable mediastinal drains.  Stable bilateral thoracic tubes.  Stable pulmonary artery catheter and left subclavian central intravenous line.  No pneumothoraces.  The lungs are clear. No interval changes.  IMPRESSION: No pneumothoraces.  No significant interval changes.  No focal abnormalities.   Original Report Authenticated By: Mervin Hack, M.D.    Dg Chest Port 1 View  11/06/2011  *RADIOLOGY REPORT*  Clinical Data: Aortic valve replacement.  Diatek catheter placement.  PORTABLE CHEST - 1 VIEW  Comparison: One-view chest 11/06/2011 at 06:45 a.m.  Findings: The patient is intubated.  Endotracheal tube is stable position.  A Swan-Ganz catheter enters via the right IJ sheath. The tip remains in the right lower pulmonary artery.  Bilateral chest tubes are now in place.  The patient is status  post median sternotomy.  The mediastinal drain is in place.  The lung volumes are low.  There is no significant pneumothorax.  A left subclavian line is stable.  IMPRESSION:  1.  Status post median sternotomy. 2.  Interval placement bilateral chest tubes and mediastinal drain without evidence for pneumothorax. 3.  Similar appearance of Swan-Ganz catheter, endotracheal tube, and left subclavian line. 4.  Low lung volumes and bibasilar atelectasis.   Original Report Authenticated By: Jamesetta Orleans. MATTERN, M.D.     Labs: BMET  Lab 11/08/11 0450 11/07/11 1600 11/07/11 1549 11/07/11 0400 11/06/11 1600 11/06/11 1147 11/06/11 0443 11/05/11 1100 11/05/11 0357  NA 138 139 139 139 139 137 141 -- --  K 4.0 4.4 4.4 4.3 4.2 3.9 4.0 -- --  CL 105 106 -- 103 101 99 103 99 --  CO2 25 24 -- 24 20 20 22 23  --  GLUCOSE 167* 124* 123* 140* 116* 119* 98 -- --  BUN 42* 52* -- 72* 105* 107* 107* 97* --  CREATININE 2.82* 3.32* -- 4.41* 6.56* 6.72* 6.59* 6.08* --  ALB -- -- -- -- -- -- -- -- --  CALCIUM 8.0* 8.3* -- 8.0* 7.5* 7.7* 7.7* 7.4* --  PHOS 3.4 4.4 -- 5.5* 7.7* -- -- -- 7.1*   CBC  Lab 11/08/11 0450 11/07/11 1549 11/07/11 0400 11/06/11 1147 11/06/11 0443  WBC 11.8* -- 13.2* 13.2* 11.5*  NEUTROABS -- -- -- -- --  HGB  7.7* 9.5* 7.9* 8.4* --  HCT 22.2* 28.0* 22.3* 23.5* --  MCV 82.8 -- 83.2 83.3 84.6  PLT 118* -- 126* 110* 103*    Medications:      . antiseptic oral rinse  15 mL Mouth Rinse QID  . cefTAZidime (FORTAZ)  IV  2 g Intravenous Q12H  . chlorhexidine  15 mL Mouth Rinse BID  . docusate sodium  200 mg Oral Daily  . insulin aspart  0-24 Units Subcutaneous Q4H  . levalbuterol  1.25 mg Nebulization Q6H  . pantoprazole (PROTONIX) IV  40 mg Intravenous Q24H  . sodium chloride  10-40 mL Intracatheter Q12H     Assessment/ Plan:   1. 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 Urine output marginal and no clinical evidence of renal recovery as yet- will continue CRRT and increase UF rate today to net negative 175mL/hr based on hemodynamic parameters. 2. 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 Status post secondary closure of sternal wound/removal of wound Vac yesterday. 3. Cardiogenic/hypovolemic (hemorrhagic) shock. On pressors with fairly tolerable BPs in spite of UF- on Epi/Dopamine 4. VDRF w mild pulm edema/central pulmonary vascular congestion- anticipated to improve on UF with CRRT 5. Suspected PE: On bivalrudin due to HIT concerns  Zetta Bills, MD 11/08/2011, 7:32 AM

## 2011-11-08 NOTE — Progress Notes (Signed)
[facility] BRIEF SICU PROGRESS NOTE  2 Days Post-Op  S/P Procedure(s) (LRB): INSERTION OF DIALYSIS CATHETER (Right) MEDIASTINAL EXPLORATION (N/A)   Stable day  Plan: Continue current care  Lizett Chowning H 11/08/2011 6:13 PM

## 2011-11-09 ENCOUNTER — Inpatient Hospital Stay (HOSPITAL_COMMUNITY): Payer: PRIVATE HEALTH INSURANCE

## 2011-11-09 DIAGNOSIS — R0902 Hypoxemia: Secondary | ICD-10-CM

## 2011-11-09 DIAGNOSIS — D72829 Elevated white blood cell count, unspecified: Secondary | ICD-10-CM

## 2011-11-09 DIAGNOSIS — I4891 Unspecified atrial fibrillation: Secondary | ICD-10-CM

## 2011-11-09 DIAGNOSIS — J9600 Acute respiratory failure, unspecified whether with hypoxia or hypercapnia: Secondary | ICD-10-CM

## 2011-11-09 LAB — TYPE AND SCREEN
ABO/RH(D): A POS
Antibody Screen: NEGATIVE
Unit division: 0
Unit division: 0

## 2011-11-09 LAB — PHOSPHORUS: Phosphorus: 3.2 mg/dL (ref 2.3–4.6)

## 2011-11-09 LAB — POCT I-STAT 3, ART BLOOD GAS (G3+)
Acid-Base Excess: 3 mmol/L — ABNORMAL HIGH (ref 0.0–2.0)
Bicarbonate: 25.2 mEq/L — ABNORMAL HIGH (ref 20.0–24.0)
O2 Saturation: 96 %
O2 Saturation: 97 %
Patient temperature: 98
Patient temperature: 98.6
TCO2: 28 mmol/L (ref 0–100)
pCO2 arterial: 32.1 mmHg — ABNORMAL LOW (ref 35.0–45.0)
pCO2 arterial: 36.6 mmHg (ref 35.0–45.0)
pH, Arterial: 7.501 — ABNORMAL HIGH (ref 7.350–7.450)
pO2, Arterial: 55 mmHg — ABNORMAL LOW (ref 80.0–100.0)
pO2, Arterial: 81 mmHg (ref 80.0–100.0)

## 2011-11-09 LAB — RENAL FUNCTION PANEL
Albumin: 2.2 g/dL — ABNORMAL LOW (ref 3.5–5.2)
BUN: 25 mg/dL — ABNORMAL HIGH (ref 6–23)
CO2: 24 mEq/L (ref 19–32)
Calcium: 8.1 mg/dL — ABNORMAL LOW (ref 8.4–10.5)
Chloride: 102 mEq/L (ref 96–112)
Creatinine, Ser: 2.05 mg/dL — ABNORMAL HIGH (ref 0.50–1.35)
GFR calc Af Amer: 41 mL/min — ABNORMAL LOW (ref >90)
GFR calc non Af Amer: 35 mL/min — ABNORMAL LOW (ref >90)
Glucose, Bld: 122 mg/dL — ABNORMAL HIGH (ref 70–99)
Phosphorus: 3 mg/dL (ref 2.3–4.6)
Potassium: 4.3 mEq/L (ref 3.5–5.1)
Sodium: 135 mEq/L (ref 135–145)

## 2011-11-09 LAB — CBC
HCT: 25.6 % — ABNORMAL LOW (ref 39.0–52.0)
MCV: 82.3 fL (ref 78.0–100.0)
RBC: 3.11 MIL/uL — ABNORMAL LOW (ref 4.22–5.81)
RDW: 16.2 % — ABNORMAL HIGH (ref 11.5–15.5)
WBC: 18.6 10*3/uL — ABNORMAL HIGH (ref 4.0–10.5)

## 2011-11-09 LAB — COMPREHENSIVE METABOLIC PANEL
ALT: 722 U/L — ABNORMAL HIGH (ref 0–53)
AST: 155 U/L — ABNORMAL HIGH (ref 0–37)
Albumin: 2.2 g/dL — ABNORMAL LOW (ref 3.5–5.2)
Alkaline Phosphatase: 182 U/L — ABNORMAL HIGH (ref 39–117)
BUN: 29 mg/dL — ABNORMAL HIGH (ref 6–23)
CO2: 25 mEq/L (ref 19–32)
Calcium: 8.5 mg/dL (ref 8.4–10.5)
Chloride: 101 mEq/L (ref 96–112)
Creatinine, Ser: 2.25 mg/dL — ABNORMAL HIGH (ref 0.50–1.35)
GFR calc Af Amer: 36 mL/min — ABNORMAL LOW (ref >90)
GFR calc non Af Amer: 31 mL/min — ABNORMAL LOW (ref >90)
Glucose, Bld: 122 mg/dL — ABNORMAL HIGH (ref 70–99)
Potassium: 4.2 mEq/L (ref 3.5–5.1)
Sodium: 135 mEq/L (ref 135–145)
Total Bilirubin: 3.1 mg/dL — ABNORMAL HIGH (ref 0.3–1.2)
Total Protein: 5.6 g/dL — ABNORMAL LOW (ref 6.0–8.3)

## 2011-11-09 LAB — APTT
aPTT: 54 seconds — ABNORMAL HIGH (ref 24–37)
aPTT: 59 seconds — ABNORMAL HIGH (ref 24–37)

## 2011-11-09 LAB — GLUCOSE, CAPILLARY
Glucose-Capillary: 107 mg/dL — ABNORMAL HIGH (ref 70–99)
Glucose-Capillary: 104 mg/dL — ABNORMAL HIGH (ref 70–99)
Glucose-Capillary: 57 mg/dL — ABNORMAL LOW (ref 70–99)
Glucose-Capillary: 119 mg/dL — ABNORMAL HIGH (ref 70–99)
Glucose-Capillary: 106 mg/dL — ABNORMAL HIGH (ref 70–99)

## 2011-11-09 LAB — MAGNESIUM: Magnesium: 2.7 mg/dL — ABNORMAL HIGH (ref 1.5–2.5)

## 2011-11-09 LAB — PROTIME-INR: INR: 1.37 (ref 0.00–1.49)

## 2011-11-09 MED ORDER — OSMOLITE 1.2 CAL PO LIQD
1000.0000 mL | ORAL | Status: DC
  Filled 2011-11-09 (×2): qty 1000

## 2011-11-09 MED ORDER — DEXTROSE 50 % IV SOLN
INTRAVENOUS | Status: AC
  Administered 2011-11-09: 25 mL via INTRAVENOUS
  Filled 2011-11-09: qty 50

## 2011-11-09 MED ORDER — AMIODARONE IV BOLUS ONLY 150 MG/100ML
150.0000 mg | Freq: Once | INTRAVENOUS | Status: AC
  Administered 2011-11-09: 150 mg via INTRAVENOUS

## 2011-11-09 NOTE — Progress Notes (Signed)
                   [address]            [place] [address]          [phone]     Episode of vomiting large amount of the tube feeds, will d/c for now.   [name] E

## 2011-11-09 NOTE — Progress Notes (Signed)
Patient ID: Gabriel Tran, male   DOB: Aug 21, 1955, 56 y.o.   MRN: [id number]                   [address]            [place] [address]          [phone]     3 Days Post-Op Procedure(s) (LRB): INSERTION OF DIALYSIS CATHETER (Right) MEDIASTINAL EXPLORATION (N/A)  Total Length of Stay:  LOS: 11 days  BP 92/48  Pulse 47  Temp 98.4 F (36.9 C) (Oral)  Resp 18  Ht 5\' 8"  (1.727 m)  Wt 263 lb 0.1 oz (119.3 kg)  BMI 39.99 kg/m2  SpO2 100%     . sodium chloride 20 mL/hr at 11/08/11 1800  . amiodarone (NEXTERONE PREMIX) 360 mg/200 mL dextrose 60 mg/hr (11/09/11 1800)  . bivalirudin (ANGIOMAX) infusion 0.5 mg/mL (Non-ACS indications) 0.015 mg/kg/hr (11/08/11 1800)  . dexmedetomidine 1 mcg/kg/hr (11/09/11 1824)  . DOPamine 2.5 mcg/kg/min (11/08/11 1900)  . epinephrine Stopped (11/07/11 1130)  . milrinone 0.25 mcg/kg/min (11/09/11 1123)  . phenylephrine (NEO-SYNEPHRINE) Adult infusion Stopped (11/04/11 1230)  . dialysis replacement fluid (prismasate) 400 mL/hr at 11/09/11 1123  . dialysis replacement fluid (prismasate) 400 mL/hr at 11/09/11 1136  . dialysate (PRISMASATE) 2,000 mL/hr at 11/09/11 1704  . DISCONTD: dexmedetomidine 0.801 mcg/kg/hr (11/08/11 1900)  . DISCONTD: feeding supplement (OSMOLITE 1.2 CAL) Stopped (11/09/11 0900)  . DISCONTD: nitroGLYCERIN Stopped (11/06/11 1600)     Lab Results  Component Value Date   WBC 18.6* 11/09/2011   HGB 8.9* 11/09/2011   HCT 25.6* 11/09/2011   PLT 153 11/09/2011   GLUCOSE 122* 11/09/2011   CHOL 191 10/30/2011   TRIG 106 10/30/2011   HDL 53 10/30/2011   LDLDIRECT 142.8 08/31/2011   LDLCALC 117* 10/30/2011   ALT 722* 11/09/2011   AST 155* 11/09/2011   NA 135 11/09/2011   K 4.3 11/09/2011   CL 102 11/09/2011   CREATININE 2.05* 11/09/2011   BUN 25* 11/09/2011   CO2 24 11/09/2011   TSH 1.148 10/29/2011   PSA 0.72 08/31/2011   INR 1.37 11/09/2011   HGBA1C 5.9* 10/29/2011   MICROALBUR 2.8* 08/31/2011   Now  In a flutter  130, attempt to rapid a pace but did convert  Delight Ovens MD  Beeper [phone] Office [phone] 11/09/2011 7:20 PM

## 2011-11-09 NOTE — Progress Notes (Signed)
ANTICOAGULATION CONSULT NOTE - Follow Up Consult  Pharmacy Consult for bivalirudin Indication: PE and r/o HIT  Labs:  Basename 11/09/11 1650 11/09/11 1600 11/09/11 0443 11/08/11 1950 11/08/11 1945 11/08/11 0450 11/07/11 1549 11/07/11 0400  HGB -- -- 8.9* -- -- 7.7* -- --  HCT -- -- 25.6* -- -- 22.2* 28.0* --  PLT -- -- 153 -- -- 118* -- 126*  APTT 59* -- 54* -- 50* -- -- --  LABPROT -- -- 16.5* -- -- 17.7* -- 18.3*  INR -- -- 1.37 -- -- 1.50* -- 1.57*  HEPARINUNFRC -- -- -- -- -- -- -- --  CREATININE -- 2.05* 2.25* 2.36* -- -- -- --  CKTOTAL -- -- -- -- -- -- -- --  CKMB -- -- -- -- -- -- -- --  TROPONINI -- -- -- -- -- -- -- --    Assessment:  56yo male on bivalirudin for suspected PE. HIT panel is negative, plt trending up, now > 150. Bivalirudin infusing without problems at 0.015mg /kg/hr, patient received a unit of blood earlier today.  aPTT is 59 this evening, slightly above goal. Pt. Has been on Bivalirudin at current rate for the past 4 days with some aPTT level fluctuation, but overall has been stable.  Goal aPTT 50-55 sec  Plan:  - Given narrow therapeutic goal with aPTT, and overall stable, no bivalirudin dose changes needed - Platelet antibody and SRA negative- Consider re-starting heparin? - F/u AM aPTT  Thank you for allowing pharmacy to be a part of this patients care team.  Bayard Hugger, PharmD, BCPS  Clinical Pharmacist  Pager: [phone]   11/09/2011 6:13 PM

## 2011-11-09 NOTE — Progress Notes (Signed)
ANTICOAGULATION CONSULT NOTE - Follow Up Consult  Pharmacy Consult for bivalirudin Indication: PE and r/o HIT  Labs:  Basename 11/09/11 0443 11/08/11 1950 11/08/11 1945 11/08/11 1732 11/08/11 0450 11/07/11 1549 11/07/11 0400  HGB 8.9* -- -- -- 7.7* -- --  HCT 25.6* -- -- -- 22.2* 28.0* --  PLT 153 -- -- -- 118* -- 126*  APTT 54* -- 50* 41* -- -- --  LABPROT 16.5* -- -- -- 17.7* -- 18.3*  INR 1.37 -- -- -- 1.50* -- 1.57*  HEPARINUNFRC -- -- -- -- -- -- --  CREATININE 2.25* 2.36* -- -- 2.82* -- --  CKTOTAL -- -- -- -- -- -- --  CKMB -- -- -- -- -- -- --  TROPONINI -- -- -- -- -- -- --    Assessment:  56yo male remains therapeutic on bivalirudin for PE. HIT panel is negative, plt trending up.  Bivalirudin infusing without problems at 0.015mg /kg/hr, patient received a unit of blood earlier today.  aPTT today is within goal (aPTT=54)  Goal aPTT 50-55 sec  Plan:  -No bivalirudin dose changes needed -Platelet antibody and SRA negative- Consider re-starting heparin? -Cont q12 hr aPTTs  Thank you for allowing pharmacy to be a part of this patients care team.  Talbert Cage, Pharm D 11/09/2011 8:20 AM

## 2011-11-09 NOTE — Consult Note (Signed)
Name: Gabriel Tran MRN: [id number] DOB: 1955/07/17    LOS: 11  Referring Provider:  CVTS Reason for Referral:  Respiratory Failure  PULMONARY / CRITICAL CARE MEDICINE  HPI:  56 year old male with history of HTN and DM who presents to the PCCM after a type A aortic dissection repair that was emergent on 9/24.  Post op, the patient remained intubated but was extubated on 9/25.  Subsequently developed a pericardial tamponade on 9/28 and was taken back to the OR for repair on.  Patient remained intubated and was taken back on 10/1 for closure of the chest.  During that period developed renal failure and was started on CVVH.  Subsequently developed a-fib with RVR then a-flutter with variable block.  Cardiology was consulted and was managing.  On 10/4 patient was not weaning off the vent and PCCM was consulted to assist with weaning.  Past Medical History  Diagnosis Date  . Hypertension   . Hyperglycemia     a. A1C 5.9 in Sept 2013.  Marland Kitchen RBBB    Past Surgical History  Procedure Date  . Knee surgery     LEFT 1981  . Carpal tunnel release     2004 RIGHT  . Wisdom tooth extraction   . Thoracic aortic aneurysm repair 10/30/2011    Procedure: THORACIC ASCENDING ANEURYSM REPAIR (AAA);  Surgeon: Kerin Perna, MD;  Location: [facility] OR;  Service: Open Heart Surgery;  Laterality: N/A;  Ascending aortic dissection repair, arch reconstruction, aortic valve repair  . Insertion of dialysis catheter 11/06/2011    Procedure: INSERTION OF DIALYSIS CATHETER;  Surgeon: Kerin Perna, MD;  Location: [facility] OR;  Service: Thoracic;  Laterality: Right;  . Mediastinal exploration 11/06/2011    Procedure: MEDIASTINAL EXPLORATION;  Surgeon: Kerin Perna, MD;  Location: [facility] OR;  Service: Thoracic;  Laterality: N/A;  sternal closure; removal of wound vac   Prior to Admission medications   Medication Sig Start Date End Date Taking? Authorizing Provider  amLODipine (NORVASC) 5 MG tablet Take 1 tablet (5 mg total) by  mouth daily. 09/06/11  Yes Joaquim Nam, MD  metoprolol (LOPRESSOR) 100 MG tablet Take 1 tablet (100 mg total) by mouth 2 (two) times daily. 09/06/11  Yes Joaquim Nam, MD   Allergies No Known Allergies  Family History Family History  Problem Relation Age of Onset  . Parkinsonism Mother   . Leukemia Father     AML at 53  . Colon polyps Brother   . Kidney disease Brother   . Heart disease Brother     CABG at age 6.  . Prostate cancer Neg Hx   . Colon cancer Neg Hx   . Heart disease Father     Angioplasty in his 47s, CABG age 64.   Social History  reports that he quit smoking about 21 years ago. His smoking use included Cigarettes. He does not have any smokeless tobacco history on file. He reports that he drinks alcohol. He reports that he does not use illicit drugs.  Review Of Systems:  Unattainable, patient is intubated.  Current Status: Critical  Vital Signs: Temp:  [97 F (36.1 C)-98.4 F (36.9 C)] 97.8 F (36.6 C) (10/04 1153) Pulse Rate:  [78-143] 131  (10/04 1400) Resp:  [10-32] 22  (10/04 1400) BP: (69-205)/(44-91) 92/48 mmHg (10/04 1100) SpO2:  [98 %-100 %] 100 % (10/04 1400) Arterial Line BP: (80-234)/(38-96) 106/49 mmHg (10/04 1400) FiO2 (%):  [40 %-70 %] 40 % (10/04  1331)  Physical Examination: General:  Chronically ill appearing male, appears frustrated but in no acute distress. Neuro:  Alert, oriented and interactive.  Moves all ext to command. HEENT:  [place]/AT, PERRL, EOM-I and MMM. Neck:  Supple, -LAN and -thyromegally. Cardiovascular:  IRIR, nl s1/s2, -M/R/G. Lungs:  Diffuse crackles and coarse BS diffusely. Abdomen:  Soft, NT, ND and +BS. Musculoskeletal:  1+ edema and -tenderness. Skin:  Wound intact.  Active Problems:  HTN (hypertension), malignant  Chest pain  Leukocytosis  RBBB  Aortic dissection   ASSESSMENT AND PLAN  PULMONARY  Lab 11/09/11 1124 11/09/11 0950 11/09/11 0450 11/08/11 1129 11/08/11 0455  PHART 7.452* 7.467* 7.501*  7.471* 7.417  PCO2ART 34.3* 36.6 32.1* 34.5* 37.9  PO2ART 81.0 74.0* 55.0* 59.0* 71.0*  HCO3 23.9 26.4* 25.2* 25.1* 24.7*  O2SAT 97.0 96.0 92.0 92.0 95.0   Ventilator Settings: Vent Mode:  [-] PRVC FiO2 (%):  [40 %-70 %] 40 % Set Rate:  [14 bmp] 14 bmp Vt Set:  [550 mL-600 mL] 550 mL PEEP:  [5 cmH20-8 cmH20] 5 cmH20 Pressure Support:  [5 cmH20-10 cmH20] 5 cmH20 Plateau Pressure:  [18 cmH20-22 cmH20] 20 cmH20 CXR:  Pulmonary edema, ?pleural air and ET tube and CT ok. ETT:  9/24>>>9/25, 9/28>>>  A:  Post op respiratory failure due to fluid overload and cardiac disorder resulting in pulmonary edema. P:   CVVH negative balance. Hold TF. Wean again today and attempt to extubate.  CARDIOVASCULAR No results found for this basename: TROPONINI:5,LATICACIDVEN:5, O2SATVEN:5,PROBNP:5 in the last 168 hours ECG:  Noted Lines: R IJ Cordis and L  TLC, dates unknown  A: A-fib with RVR, tamponade. P:  Continue fluid negative. Additional bolus of amiodarone to stabilize heart rhythm. Cardiology following. Beta blockers. If hypotensive then neo.  RENAL  Lab 11/09/11 0443 11/08/11 1950 11/08/11 0450 11/07/11 1600 11/07/11 1549 11/07/11 0400 11/04/11 1820 11/04/11 0330  NA 135 138 138 139 139 -- -- --  K 4.2 4.2 -- -- -- -- -- --  CL 101 104 105 106 -- 103 -- --  CO2 25 24 25 24  -- 24 -- --  BUN 29* 30* 42* 52* -- 72* -- --  CREATININE 2.25* 2.36* 2.82* 3.32* -- 4.41* -- --  CALCIUM 8.5 8.3* 8.0* 8.3* -- 8.0* -- --  MG 2.7* -- 2.6* -- -- 3.0* 3.5* 3.7*  PHOS 3.2 3.2 3.4 4.4 -- 5.5* -- --   Intake/Output      10/03 0701 - 10/04 0700 10/04 0701 - 10/05 0700   I.V. (mL/kg) 1754 (14.7) 406.9 (3.4)   Blood 362.5    NG/GT 690 120   IV Piggyback 200 50   Total Intake(mL/kg) 3006.5 (25.2) 576.9 (4.8)   Urine (mL/kg/hr) 145 (0.1) 10 (0)   Other 5223 1671   Chest Tube 310    Total Output 5678 1681   Net -2671.5 -1104.1         Foley:  In place  A:  ARF while hospitalized.  On CVVH.   Negative fluid balance. P:   CVVH negative balance. F/U BMET.  GASTROINTESTINAL  Lab 11/09/11 0443 11/08/11 1950 11/08/11 0450 11/07/11 1600 11/07/11 0400 11/06/11 0443 11/05/11 1100 11/04/11 0330  AST 155* -- 215* -- -- 642* 1361* 3129*  ALT 722* -- 908* -- -- 1679* 2394* 2643*  ALKPHOS 182* -- 91 -- -- 72 76 54  BILITOT 3.1* -- 3.1* -- -- 2.3* 2.5* 1.7*  PROT 5.6* -- 4.9* -- -- 4.5* 4.7* 4.9*  ALBUMIN  2.2* 2.2* 2.0* 2.2* 2.1* -- -- --    A:  Elevated but improving LFTs, likely shocked liver. P:   Hold TF for potential extubation. F/U LFT.  HEMATOLOGIC  Lab 11/09/11 0443 11/08/11 1945 11/08/11 1732 11/08/11 0450 11/07/11 2025 11/07/11 1549 11/07/11 0400 11/06/11 1147 11/06/11 0443 11/05/11 0554  HGB 8.9* -- -- 7.7* -- 9.5* 7.9* 8.4* -- --  HCT 25.6* -- -- 22.2* -- 28.0* 22.3* 23.5* -- --  PLT 153 -- -- 118* -- -- 126* 110* 103* --  INR 1.37 -- -- 1.50* -- -- 1.57* -- 1.96* 2.55*  APTT 54* 50* 41* 50* 52* -- -- -- -- --   A:  Leukocytosis and anemia of chronic disease. P:  Keep Hg at 8 given coronary history. Continue abx. F/U CBC.  INFECTIOUS  Lab 11/09/11 0443 11/08/11 0450 11/07/11 0400 11/06/11 1147 11/06/11 0443  WBC 18.6* 11.8* 13.2* 13.2* 11.5*  PROCALCITON -- -- -- -- --   Cultures: Sputum 9/29>>>E coli and candida Urine 9/29>>>NTD Blood 9/29>>>NTD Antibiotics: Ceftaz ???>>>  A:  ?PNA, unlikely. P:   Ceftaz per CVTS.  ENDOCRINE  Lab 11/09/11 1150 11/09/11 0801 11/09/11 0343 11/09/11 0014 11/08/11 2019  GLUCAP 104* 107* 90 85 94   A:  Hyperglycemia history.   P:   CBG's and ISS.  NEUROLOGIC  A:  No active issues. P:   Monitor.  Will hold TF today and attempt to weaning again today.  CC time 45 min.  Koren Bound, M.D. Pulmonary and Critical Care Medicine [facility] Pager: [phone]  11/09/2011, 2:06 PM

## 2011-11-09 NOTE — Progress Notes (Addendum)
TCTS DAILY PROGRESS NOTE                   [address]            [place] [id number]          [phone]      3 Days Post-Op Procedure(s) (LRB): INSERTION OF DIALYSIS CATHETER (Right) MEDIASTINAL EXPLORATION (N/A)  Total Length of Stay:  LOS: 11 days   Subjective: Stable but remains critically ill, pO2 remains low-55  Objective: Vital signs in last 24 hours: Temp:  [96.3 F (35.7 C)-98.4 F (36.9 C)] 98.2 F (36.8 C) (10/04 0808) Pulse Rate:  [87-139] 131  (10/04 0700) Cardiac Rhythm:  [-] Sinus tachycardia (10/04 0230) Resp:  [0-29] 10  (10/04 0700) BP: (69-205)/(44-91) 95/53 mmHg (10/04 0700) SpO2:  [99 %-100 %] 100 % (10/04 0700) Arterial Line BP: (80-198)/(38-70) 81/38 mmHg (10/04 0700) FiO2 (%):  [40 %-70 %] 50 % (10/04 0744)  Filed Weights   11/06/11 0500 11/07/11 0500 11/08/11 0500  Weight: 270 lb 11.6 oz (122.8 kg) 264 lb 5.3 oz (119.9 kg) 263 lb 0.1 oz (119.3 kg)    Weight change:    Hemodynamic parameters for last 24 hours: PAP: (21-31)/(14-19) 21/14 mmHg CVP:  [4 mmHg-13 mmHg] 13 mmHg  Intake/Output from previous day: 10/03 0701 - 10/04 0700 In: 3006.5 [I.V.:1754; Blood:362.5; NG/GT:690; IV Piggyback:200] Out: 5678 [Urine:145; Chest Tube:310]  Intake/Output this shift: Total I/O In: -  Out: 248 [Other:248]  Current Meds: Scheduled Meds:   . amiodarone  150 mg Intravenous Once  . antiseptic oral rinse  15 mL Mouth Rinse QID  . cefTAZidime (FORTAZ)  IV  2 g Intravenous Q12H  . chlorhexidine  15 mL Mouth Rinse BID  . dextrose      . docusate sodium  200 mg Oral Daily  . fentaNYL      . insulin aspart  0-24 Units Subcutaneous Q4H  . levalbuterol  1.25 mg Nebulization Q6H  . pantoprazole (PROTONIX) IV  40 mg Intravenous Q24H  . sodium chloride  10-40 mL Intracatheter Q12H  . DISCONTD: dexmedetomidine       Continuous Infusions:   . sodium chloride 20 mL/hr at 11/08/11 1800  . amiodarone (NEXTERONE PREMIX) 360 mg/200 mL  dextrose 30 mg/hr (11/09/11 0607)  . bivalirudin (ANGIOMAX) infusion 0.5 mg/mL (Non-ACS indications) 0.015 mg/kg/hr (11/08/11 1800)  . dexmedetomidine 0.8 mcg/kg/hr (11/09/11 0816)  . DOPamine 2.5 mcg/kg/min (11/08/11 1900)  . epinephrine Stopped (11/07/11 1130)  . milrinone 0.25 mcg/kg/min (11/09/11 0039)  . nitroGLYCERIN Stopped (11/06/11 1600)  . phenylephrine (NEO-SYNEPHRINE) Adult infusion Stopped (11/04/11 1230)  . dialysis replacement fluid (prismasate) 400 mL/hr at 11/08/11 2215  . dialysis replacement fluid (prismasate) 400 mL/hr at 11/08/11 2225  . dialysate (PRISMASATE) 2,000 mL/hr at 11/09/11 0042  . DISCONTD: dexmedetomidine 0.801 mcg/kg/hr (11/08/11 1900)   PRN Meds:.dextrose, fentaNYL, metoprolol, midazolam, morphine injection, ondansetron (ZOFRAN) IV, ondansetron, oxyCODONE, sodium chloride, sodium chloride  General appearance: alert, cooperative and no distress Neurologic: grosly intact Heart: regular rate and rhythm Lungs: fairly clear anteriorly Abdomen: + BS, soft, nontender Extremities: + edema Wound: dressings- ok  Lab Results: CBC: Basename 11/09/11 0443 11/08/11 0450  WBC 18.6* 11.8*  HGB 8.9* 7.7*  HCT 25.6* 22.2*  PLT 153 118*   BMET:  Basename 11/09/11 0443 11/08/11 1950  NA 135 138  K 4.2 4.2  CL 101 104  CO2 25 24  GLUCOSE 122* 127*  BUN 29* 30*  CREATININE  2.25* 2.36*  CALCIUM 8.5 8.3*    PT/INR:  Basename 11/09/11 0443  LABPROT 16.5*  INR 1.37   Radiology: Dg Chest Port 1 View  11/09/2011  *RADIOLOGY REPORT*  Clinical Data: Evaluate endotracheal tube placement.  PORTABLE CHEST - 1 VIEW  Comparison: 11/09/2011  Findings: Endotracheal tube is approximately 3 cm above the carina. Right jugular central line at the junction of the jugular vein and innominate vein.  There is a left subclavian central line with the tip in the SVC region.  There are bilateral chest drains with a small area of lucency at the left costophrenic angle which could  represent a small focus of pleural air.  Evidence for mediastinal drains.  Low lung volumes with left basilar densities.  Nasogastric tube extends into the abdomen.  IMPRESSION: Support apparatuses appear to be grossly stable in position.  Low lung volumes.  There is a small focus of lucency at the left costophrenic angle and cannot exclude a small amount of pleural air in this location.  Persistent left basilar densities could represent volume loss.   Original Report Authenticated By: Richarda Overlie, M.D.    Dg Chest Port 1 View  11/09/2011  *RADIOLOGY REPORT*  Clinical Data: AVR, intubated.  PORTABLE CHEST - 1 VIEW  Comparison: 11/08/2011  Findings: Endotracheal tube tip projects 4.8 cm proximal to the carina.  NG tube descends below the level of the image.  Bilateral chest tubes and mediastinal drain are in place.  Right IJ sheath with interval removal of the Swan-Ganz catheter.  Left subclavian central venous catheter tip projects over the proximal SVC.  Status post median sternotomy and AVR.  Degraded by rotation. Hypoaeration with interstitial and vascular crowding.  Mild bibasilar opacities, likely atelectasis.  No interval osseous change. Skin staples project over the midline and right axilla. Right axillary surgical clips.  IMPRESSION: Interval removal of the Swan-Ganz catheter placement of a right IJ sheath.  Endotracheal tube tip projects 4.8 cm proximal to the carina. Support devices otherwise unchanged.  Postoperative changes and bibasilar opacities, likely atelectasis.   Original Report Authenticated By: Waneta Martins, M.D.    Dg Chest Port 1 View  11/08/2011  *RADIOLOGY REPORT*  Clinical Data: Thoracic aneurysm repair, cardiac tamponade.  PORTABLE CHEST - 1 VIEW  Comparison: 11/07/2011; 11/06/2011; chest CT - 10/30/2011  Findings:  Grossly unchanged enlarged cardiac silhouette and mediastinal contours.  Stable position of support apparatus including right jugular approach PA catheter tip  overlying expected location of the right interlobar pulmonary artery.  Lung volumes remain persistently reduced.  Grossly unchanged trace bilateral effusions and bibasilar opacities.  No focal airspace opacity.  Unchanged bones.  Skin staples overlie the right axilla.  IMPRESSION: 1.  Stable positioning of support apparatus.  No pneumothorax. 2.  Right jugular approach PA catheter tip remains overlying the right intralobar pulmonary artery. 3.  Persistently reduced lung volumes with likely small bilateral effusions and bibasilar opacities, likely atelectasis.   Original Report Authenticated By: Waynard Reeds, M.D.    Dg Chest Port 1 View  11/07/2011  *RADIOLOGY REPORT*  Clinical Data:  aortic valve replacement  PORTABLE CHEST - 1 VIEW  Comparison: 11/06/2011  Findings: Stable position of endotracheal tube with tip several centimeters above the carina. Nasogastric extends into the stomach.  The tip of the tube is not on the film.  Stable mediastinal drains.  Stable bilateral thoracic tubes.  Stable pulmonary artery catheter and left subclavian central intravenous line.  No pneumothoraces.  The lungs are clear. No interval changes.  IMPRESSION: No pneumothoraces.  No significant interval changes.  No focal abnormalities.   Original Report Authenticated By: Mervin Hack, M.D.      Assessment/Plan: S/P Procedure(s) (LRB): INSERTION OF DIALYSIS CATHETER (Right) MEDIASTINAL EXPLORATION (N/A)  1 Conts to be critically ill, improving 2 vent dependent- cont PRVC, can possibly  try PS weaning 3 CRRT ultrafiltration- BUN/Creat cont to improved, minimal UO-about 5cc hour 4 wean pressors as parameters allow 5 HIT panel negative- cont angiomax per Dr Zenaida Niece Trigt 6 Tube feeds-CONT/advance 7 e coli in sputum - sens to fortaz 8 change to osmolite TF's per Dr Cristi Loron E 11/09/2011 8:20 AM  Hemodynamics stable with good perfusion on low-dose dopamine, milrinone, and amiodarone  Continue low  dose bivalirudin infusion - clinical right pulmonary  Embolus. Hold Coumadin for now.                                                                                                                                                                                                           CCM consult placed for inability to wean ventilator secondary to hypoxia. Chest x-ray fairly clear. Probably needs more fluid removed with continuous dialysis  Gram-negative pneumonia-Escherichia coli on Fortaz with adequate coverage

## 2011-11-09 NOTE — Progress Notes (Signed)
During patient bath, before turning, I noted patient to have a significant ETT cuff leak.  He was able to whisper around the tube.  RT was called, and Luisa Hart came to troubleshoot.  ETT in same location at lip.  Cuff leak resolved.  Immediately after conclusion of bath, patient became tachycardic into the 150s.  Pacemaker was turned to VVI backup at 50 immediately.  CRRT patient fluid removal rate set to 0.  As the patient recovered from turning for the bath and after 2.5 mg metoprolol PRN dose, the heart rate slowed to 110s.  O2 saturation remained at 97% or greater throughout.

## 2011-11-09 NOTE — Progress Notes (Signed)
Patient ID: Gabriel Tran, male   DOB: 08-07-1955, 56 y.o.   MRN: [id number]   [facility] Progress Note    Subjective:   Transiently tachycardic overnight limiting UF for about 2 hours- filter lasting about 24hr on bivalrudin. Issues with ETT leak noted overnight   Objective:   BP 95/53  Pulse 131  Temp 98.1 F (36.7 C) (Oral)  Resp 10  Ht 5\' 8"  (1.727 m)  Wt 119.3 kg (263 lb 0.1 oz)  BMI 39.99 kg/m2  SpO2 100%  Intake/Output Summary (Last 24 hours) at 11/09/11 0721 Last data filed at 11/09/11 0700  Gross per 24 hour  Intake 3006.52 ml  Output   5678 ml  Net -2671.48 ml   Weight change:   Physical Exam: WUJ:[id number], awake on the vent AOZ:HYQMV RRR, normal s1 and s2- no murmur/rubs Resp:Mechanical BS bilaterally- anterior chest HQI:ONGE, obese, NT, BS normal Ext: 2+ UE edema- 2+LE edema (Improving)  Imaging: Dg Chest Port 1 View  11/09/2011  *RADIOLOGY REPORT*  Clinical Data: AVR, intubated.  PORTABLE CHEST - 1 VIEW  Comparison: 11/08/2011  Findings: Endotracheal tube tip projects 4.8 cm proximal to the carina.  NG tube descends below the level of the image.  Bilateral chest tubes and mediastinal drain are in place.  Right IJ sheath with interval removal of the Swan-Ganz catheter.  Left subclavian central venous catheter tip projects over the proximal SVC.  Status post median sternotomy and AVR.  Degraded by rotation. Hypoaeration with interstitial and vascular crowding.  Mild bibasilar opacities, likely atelectasis.  No interval osseous change. Skin staples project over the midline and right axilla. Right axillary surgical clips.  IMPRESSION: Interval removal of the Swan-Ganz catheter placement of a right IJ sheath.  Endotracheal tube tip projects 4.8 cm proximal to the carina. Support devices otherwise unchanged.  Postoperative changes and bibasilar opacities, likely atelectasis.   Original Report Authenticated By: Waneta Martins, M.D.    Dg Chest  Port 1 View  11/08/2011  *RADIOLOGY REPORT*  Clinical Data: Thoracic aneurysm repair, cardiac tamponade.  PORTABLE CHEST - 1 VIEW  Comparison: 11/07/2011; 11/06/2011; chest CT - 10/30/2011  Findings:  Grossly unchanged enlarged cardiac silhouette and mediastinal contours.  Stable position of support apparatus including right jugular approach PA catheter tip overlying expected location of the right interlobar pulmonary artery.  Lung volumes remain persistently reduced.  Grossly unchanged trace bilateral effusions and bibasilar opacities.  No focal airspace opacity.  Unchanged bones.  Skin staples overlie the right axilla.  IMPRESSION: 1.  Stable positioning of support apparatus.  No pneumothorax. 2.  Right jugular approach PA catheter tip remains overlying the right intralobar pulmonary artery. 3.  Persistently reduced lung volumes with likely small bilateral effusions and bibasilar opacities, likely atelectasis.   Original Report Authenticated By: Waynard Reeds, M.D.    Dg Chest Port 1 View  11/07/2011  *RADIOLOGY REPORT*  Clinical Data:  aortic valve replacement  PORTABLE CHEST - 1 VIEW  Comparison: 11/06/2011  Findings: Stable position of endotracheal tube with tip several centimeters above the carina. Nasogastric extends into the stomach.  The tip of the tube is not on the film.  Stable mediastinal drains.  Stable bilateral thoracic tubes.  Stable pulmonary artery catheter and left subclavian central intravenous line.  No pneumothoraces.  The lungs are clear. No interval changes.  IMPRESSION: No pneumothoraces.  No significant interval changes.  No focal abnormalities.   Original Report Authenticated By: Mervin Hack, M.D.  Labs: BMET  Lab 11/09/11 0443 11/08/11 1950 11/08/11 0450 11/07/11 1600 11/07/11 1549 11/07/11 0400 11/06/11 1600 11/06/11 1147 11/05/11 0357  NA 135 138 138 139 139 139 139 -- --  K 4.2 4.2 4.0 4.4 4.4 4.3 4.2 -- --  CL 101 104 105 106 -- 103 101 99 --  CO2 25 24 25  24  -- 24 20 20  --  GLUCOSE 122* 127* 167* 124* 123* 140* 116* -- --  BUN 29* 30* 42* 52* -- 72* 105* 107* --  CREATININE 2.25* 2.36* 2.82* 3.32* -- 4.41* 6.56* 6.72* --  ALB -- -- -- -- -- -- -- -- --  CALCIUM 8.5 8.3* 8.0* 8.3* -- 8.0* 7.5* 7.7* --  PHOS 3.2 3.2 3.4 4.4 -- 5.5* 7.7* -- 7.1*   CBC  Lab 11/09/11 0443 11/08/11 0450 11/07/11 1549 11/07/11 0400 11/06/11 1147  WBC 18.6* 11.8* -- 13.2* 13.2*  NEUTROABS -- -- -- -- --  HGB 8.9* 7.7* 9.5* 7.9* --  HCT 25.6* 22.2* 28.0* 22.3* --  MCV 82.3 82.8 -- 83.2 83.3  PLT 153 118* -- 126* 110*    Medications:      . amiodarone  150 mg Intravenous Once  . antiseptic oral rinse  15 mL Mouth Rinse QID  . cefTAZidime (FORTAZ)  IV  2 g Intravenous Q12H  . chlorhexidine  15 mL Mouth Rinse BID  . dextrose      . docusate sodium  200 mg Oral Daily  . fentaNYL      . insulin aspart  0-24 Units Subcutaneous Q4H  . levalbuterol  1.25 mg Nebulization Q6H  . pantoprazole (PROTONIX) IV  40 mg Intravenous Q24H  . sodium chloride  10-40 mL Intracatheter Q12H  . DISCONTD: dexmedetomidine         Assessment/ Plan:   1. 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 Urine output poor (only 125mL/24hrs noted) and no clinical evidence of renal recovery as yet- will continue CRRT with attempts at UF to help with edema/resp status.. 2. 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 Status post secondary closure of sternal wound on 10/1. 3. Cardiogenic/hypovolemic (hemorrhagic) shock. On pressors with fairly tolerable BPs in spite of UF- on Epi/Dopamine 4. VDRF w mild pulm edema/central pulmonary vascular congestion- anticipated to improve on UF with CRRT 5. Suspected PE: On bivalrudin due to HIT concerns  Zetta Bills, MD 11/09/2011, 7:21 AM

## 2011-11-09 NOTE — Progress Notes (Signed)
Increase FIO2 to 70. Increase PEEP to 8 per MD order, Dr Cornelius Moras.

## 2011-11-09 NOTE — Progress Notes (Signed)
Hypoglycemic Event  CBG: 56   Treatment: D50 IV 25 mL  Symptoms: None  Follow-up CBG: Time:1630 CBG Result:119  Possible Reasons for Event: Vomiting and Inadequate meal intake  Comments/MD notified:B. Veleta Miners, NP    Mitchel Honour  Remember to initiate Hypoglycemia Order Set & complete

## 2011-11-10 ENCOUNTER — Inpatient Hospital Stay (HOSPITAL_COMMUNITY): Payer: PRIVATE HEALTH INSURANCE

## 2011-11-10 LAB — COMPREHENSIVE METABOLIC PANEL
ALT: 484 U/L — ABNORMAL HIGH (ref 0–53)
AST: 83 U/L — ABNORMAL HIGH (ref 0–37)
Albumin: 2.1 g/dL — ABNORMAL LOW (ref 3.5–5.2)
Alkaline Phosphatase: 190 U/L — ABNORMAL HIGH (ref 39–117)
BUN: 25 mg/dL — ABNORMAL HIGH (ref 6–23)
CO2: 26 mEq/L (ref 19–32)
Calcium: 8.5 mg/dL (ref 8.4–10.5)
Chloride: 102 mEq/L (ref 96–112)
Creatinine, Ser: 2.32 mg/dL — ABNORMAL HIGH (ref 0.50–1.35)
GFR calc Af Amer: 35 mL/min — ABNORMAL LOW (ref >90)
GFR calc non Af Amer: 30 mL/min — ABNORMAL LOW (ref >90)
Glucose, Bld: 131 mg/dL — ABNORMAL HIGH (ref 70–99)
Potassium: 4.6 mEq/L (ref 3.5–5.1)
Sodium: 135 mEq/L (ref 135–145)
Total Bilirubin: 1.7 mg/dL — ABNORMAL HIGH (ref 0.3–1.2)
Total Protein: 5.8 g/dL — ABNORMAL LOW (ref 6.0–8.3)

## 2011-11-10 LAB — CULTURE, BLOOD (ROUTINE X 2)
Culture: NO GROWTH
Culture: NO GROWTH

## 2011-11-10 LAB — RENAL FUNCTION PANEL
CO2: 23 mEq/L (ref 19–32)
Calcium: 8.7 mg/dL (ref 8.4–10.5)
GFR calc Af Amer: 34 mL/min — ABNORMAL LOW (ref >90)
GFR calc non Af Amer: 29 mL/min — ABNORMAL LOW (ref >90)
Potassium: 4.4 mEq/L (ref 3.5–5.1)
Sodium: 132 mEq/L — ABNORMAL LOW (ref 135–145)

## 2011-11-10 LAB — POCT I-STAT 3, ART BLOOD GAS (G3+)
O2 Saturation: 93 %
Patient temperature: 98.4
pCO2 arterial: 36.7 mmHg (ref 35.0–45.0)
pH, Arterial: 7.45 (ref 7.350–7.450)

## 2011-11-10 LAB — CBC
Hemoglobin: 8.6 g/dL — ABNORMAL LOW (ref 13.0–17.0)
MCV: 84.8 fL (ref 78.0–100.0)
Platelets: 179 10*3/uL (ref 150–400)
RBC: 3.02 MIL/uL — ABNORMAL LOW (ref 4.22–5.81)
WBC: 17.9 10*3/uL — ABNORMAL HIGH (ref 4.0–10.5)

## 2011-11-10 LAB — PREALBUMIN: Prealbumin: 16.6 mg/dL — ABNORMAL LOW (ref 17.0–34.0)

## 2011-11-10 LAB — PROTIME-INR: Prothrombin Time: 16.2 seconds — ABNORMAL HIGH (ref 11.6–15.2)

## 2011-11-10 LAB — GLUCOSE, CAPILLARY
Glucose-Capillary: 104 mg/dL — ABNORMAL HIGH (ref 70–99)
Glucose-Capillary: 114 mg/dL — ABNORMAL HIGH (ref 70–99)
Glucose-Capillary: 119 mg/dL — ABNORMAL HIGH (ref 70–99)
Glucose-Capillary: 121 mg/dL — ABNORMAL HIGH (ref 70–99)

## 2011-11-10 LAB — APTT: aPTT: 55 seconds — ABNORMAL HIGH (ref 24–37)

## 2011-11-10 LAB — MAGNESIUM: Magnesium: 2.7 mg/dL — ABNORMAL HIGH (ref 1.5–2.5)

## 2011-11-10 MED ORDER — LEVALBUTEROL HCL 0.63 MG/3ML IN NEBU
0.6300 mg | INHALATION_SOLUTION | Freq: Four times a day (QID) | RESPIRATORY_TRACT | Status: DC
  Administered 2011-11-10 – 2011-11-11 (×6): 0.63 mg via RESPIRATORY_TRACT
  Filled 2011-11-10 (×11): qty 3

## 2011-11-10 MED ORDER — FENTANYL CITRATE 0.05 MG/ML IJ SOLN
INTRAMUSCULAR | Status: AC
  Administered 2011-11-10: 50 ug via INTRAVENOUS
  Filled 2011-11-10: qty 2

## 2011-11-10 MED ORDER — DILTIAZEM LOAD VIA INFUSION
10.0000 mg | Freq: Once | INTRAVENOUS | Status: AC
  Administered 2011-11-10: 10 mg via INTRAVENOUS
  Filled 2011-11-10: qty 10

## 2011-11-10 MED ORDER — PHENOL 1.4 % MT LIQD
1.0000 | OROMUCOSAL | Status: DC | PRN
  Administered 2011-11-10: 1 via OROMUCOSAL
  Filled 2011-11-10: qty 177

## 2011-11-10 MED ORDER — DILTIAZEM HCL 100 MG IV SOLR
5.0000 mg/h | INTRAVENOUS | Status: DC
  Administered 2011-11-10: 15 mg/h via INTRAVENOUS
  Administered 2011-11-10: 5 mg/h via INTRAVENOUS
  Administered 2011-11-10: 15 mg/h via INTRAVENOUS
  Administered 2011-11-11: 5 mg/h via INTRAVENOUS
  Administered 2011-11-11: 15 mg/h via INTRAVENOUS
  Administered 2011-11-11: 5 mg/h via INTRAVENOUS
  Filled 2011-11-10 (×2): qty 100

## 2011-11-10 MED ORDER — DILTIAZEM LOAD VIA INFUSION
10.0000 mg | Freq: Once | INTRAVENOUS | Status: AC
  Administered 2011-11-10: 10 mg via INTRAVENOUS

## 2011-11-10 MED ORDER — FENTANYL BOLUS VIA INFUSION
25.0000 ug | INTRAVENOUS | Status: DC | PRN
  Filled 2011-11-10: qty 50

## 2011-11-10 MED ORDER — LEVALBUTEROL HCL 1.25 MG/0.5ML IN NEBU
0.6300 mg | INHALATION_SOLUTION | Freq: Four times a day (QID) | RESPIRATORY_TRACT | Status: DC
  Filled 2011-11-10 (×4): qty 0.26

## 2011-11-10 MED ORDER — FENTANYL CITRATE 0.05 MG/ML IJ SOLN
25.0000 ug | INTRAMUSCULAR | Status: DC | PRN
  Administered 2011-11-10 – 2011-11-11 (×4): 50 ug via INTRAVENOUS
  Filled 2011-11-10 (×2): qty 2

## 2011-11-10 MED ORDER — OXYCODONE HCL 5 MG PO TABS
5.0000 mg | ORAL_TABLET | ORAL | Status: DC | PRN
  Administered 2011-11-10: 5 mg via ORAL
  Administered 2011-11-10: 10 mg via ORAL
  Administered 2011-11-10: 5 mg via ORAL
  Administered 2011-11-11 (×2): 10 mg via ORAL
  Administered 2011-11-11: 5 mg via ORAL
  Administered 2011-11-11: 10 mg via ORAL
  Administered 2011-11-11 (×2): 5 mg via ORAL
  Administered 2011-11-12 – 2011-11-13 (×4): 10 mg via ORAL
  Administered 2011-11-14 (×2): 5 mg via ORAL
  Administered 2011-11-14 – 2011-11-16 (×3): 10 mg via ORAL
  Filled 2011-11-10 (×3): qty 2
  Filled 2011-11-10: qty 1
  Filled 2011-11-10 (×2): qty 2
  Filled 2011-11-10: qty 1
  Filled 2011-11-10 (×2): qty 2
  Filled 2011-11-10 (×2): qty 1
  Filled 2011-11-10 (×5): qty 2
  Filled 2011-11-10: qty 1
  Filled 2011-11-10: qty 2

## 2011-11-10 NOTE — Progress Notes (Signed)
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 Back to OR 9/28 emergently for cardiac tamponade.  Anticoagulation: PE on Angiomax, No HIT (Platelet antibody and SRA negative). Goal only 50-55 sec, PTT on 0.1 mg/kg/hr = 98 sec, held x 1hr then 0.05 mg/kg/hr = 84 sec. Remains in goal range on 0.015 mg/kg.hr PTT today 55  Infectious Disease: Afeb, wbc 17.9. Giving empiric ABX d/t emergent sternotomy in ICU 9/28. Scr 2.32 on CRRT. 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 Anuric.  Pulmonary: VDRF on Precedex  Heme/ Oncology: Hgb 8.6. HIT panel negative  PTA Medication Issues: on norvasc PTA  Best Practices: bivalirudin, mouth care, IV PPI  Goal: APTT 50-55sec  Plan: Cont angiomax at 0.015mg /kg/hr Change aPTT monitoring to  daily. Continue Elita Quick 2g/12h CVVH dosing.   Helana Macbride S. Merilynn Finland, PharmD, [facility] Clinical Staff Pharmacist Pager [phone]   11/10/2011 1:41 PM

## 2011-11-10 NOTE — Progress Notes (Signed)
Patient ID: Gabriel Tran, male   DOB: 1955-10-18, 56 y.o.   MRN: [id number]   [facility] Progress Note    Subjective:   No acute clinical issues overnight other than hypoglycemia. CRRT filter clotted earlier today, restarted without problems.    Objective:   BP 92/48  Pulse 130  Temp 98.6 F (37 C) (Oral)  Resp 16  Ht 5\' 8"  (1.727 m)  Wt 113.6 kg (250 lb 7.1 oz)  BMI 38.08 kg/m2  SpO2 99%  Intake/Output Summary (Last 24 hours) at 11/10/11 0732 Last data filed at 11/10/11 0700  Gross per 24 hour  Intake 2390.86 ml  Output   5992 ml  Net -3601.14 ml   Weight change:   Physical Exam: Gen: Comfortably resting on the vent, awake and nods in response to questions CVS: Pulse regular tachycardia, heart sounds S1 and S2 with ESM Resp: Anteriorly mechanical breath sounds bilaterally-no rales/wheeze Abd: Soft, obese, nontender and bowel sounds are normal Ext: 1-2+ edema-continues to show improvement  Imaging: Dg Chest Port 1 View  11/10/2011  *RADIOLOGY REPORT*  Clinical Data: Aortic valve replacement  PORTABLE CHEST - 1 VIEW  Comparison:   the previous day's study  Findings: Endotracheal tube, nasogastric tube, bilateral chest tubes, left subclavian central line, and mediastinal drain are stable in position.  The right IJ catheter has been removed.  No pneumothorax.  Skin staples project over the right scapula.  Low volumes with persistent left retrocardiac consolidation / atelectasis.  Some increase in central pulmonary vascular congestion.  Question small left pleural effusion. Previous median sternotomy.  IMPRESSION:  1.  Removal of right IJ central line. 2. Mild central pulmonary vascular congestion.   Original Report Authenticated By: Osa Craver, M.D.    Dg Chest Port 1 View  11/09/2011  *RADIOLOGY REPORT*  Clinical Data: Evaluate endotracheal tube placement.  PORTABLE CHEST - 1 VIEW  Comparison: 11/09/2011  Findings: Endotracheal tube is  approximately 3 cm above the carina. Right jugular central line at the junction of the jugular vein and innominate vein.  There is a left subclavian central line with the tip in the SVC region.  There are bilateral chest drains with a small area of lucency at the left costophrenic angle which could represent a small focus of pleural air.  Evidence for mediastinal drains.  Low lung volumes with left basilar densities.  Nasogastric tube extends into the abdomen.  IMPRESSION: Support apparatuses appear to be grossly stable in position.  Low lung volumes.  There is a small focus of lucency at the left costophrenic angle and cannot exclude a small amount of pleural air in this location.  Persistent left basilar densities could represent volume loss.   Original Report Authenticated By: Richarda Overlie, M.D.    Dg Chest Port 1 View  11/09/2011  *RADIOLOGY REPORT*  Clinical Data: AVR, intubated.  PORTABLE CHEST - 1 VIEW  Comparison: 11/08/2011  Findings: Endotracheal tube tip projects 4.8 cm proximal to the carina.  NG tube descends below the level of the image.  Bilateral chest tubes and mediastinal drain are in place.  Right IJ sheath with interval removal of the Swan-Ganz catheter.  Left subclavian central venous catheter tip projects over the proximal SVC.  Status post median sternotomy and AVR.  Degraded by rotation. Hypoaeration with interstitial and vascular crowding.  Mild bibasilar opacities, likely atelectasis.  No interval osseous change. Skin staples project over the midline and right axilla. Right axillary surgical clips.  IMPRESSION:  Interval removal of the Swan-Ganz catheter placement of a right IJ sheath.  Endotracheal tube tip projects 4.8 cm proximal to the carina. Support devices otherwise unchanged.  Postoperative changes and bibasilar opacities, likely atelectasis.   Original Report Authenticated By: Waneta Martins, M.D.     Labs: BMET  Lab 11/10/11 0400 11/09/11 1600 11/09/11 0443 11/08/11 1950  11/08/11 0450 11/07/11 1600 11/07/11 1549 11/07/11 0400  NA 135 135 135 138 138 139 139 --  K 4.6 4.3 4.2 4.2 4.0 4.4 4.4 --  CL 102 102 101 104 105 106 -- 103  CO2 26 24 25 24 25 24  -- 24  GLUCOSE 131* 122* 122* 127* 167* 124* 123* --  BUN 25* 25* 29* 30* 42* 52* -- 72*  CREATININE 2.32* 2.05* 2.25* 2.36* 2.82* 3.32* -- 4.41*  ALB -- -- -- -- -- -- -- --  CALCIUM 8.5 8.1* 8.5 8.3* 8.0* 8.3* -- 8.0*  PHOS 3.6 3.0 3.2 3.2 3.4 4.4 -- 5.5*   CBC  Lab 11/10/11 0400 11/09/11 0443 11/08/11 0450 11/07/11 1549 11/07/11 0400  WBC 17.9* 18.6* 11.8* -- 13.2*  NEUTROABS -- -- -- -- --  HGB 8.6* 8.9* 7.7* 9.5* --  HCT 25.6* 25.6* 22.2* 28.0* --  MCV 84.8 82.3 82.8 -- 83.2  PLT 179 153 118* -- 126*    Medications:      . amiodarone  150 mg Intravenous Once  . antiseptic oral rinse  15 mL Mouth Rinse QID  . cefTAZidime (FORTAZ)  IV  2 g Intravenous Q12H  . chlorhexidine  15 mL Mouth Rinse BID  . docusate sodium  200 mg Oral Daily  . insulin aspart  0-24 Units Subcutaneous Q4H  . levalbuterol  1.25 mg Nebulization Q6H  . pantoprazole (PROTONIX) IV  40 mg Intravenous Q24H  . sodium chloride  10-40 mL Intracatheter Q12H     Assessment/ Plan:   1. 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 Urine output poor (only 121mL/24hrs noted) and no clinical evidence of renal recovery as yet- will continue CRRT the current prescription with a net goal of getting 150 mL of per hour, we'll start de-escalating the goal tomorrow given his most recent CVP of 9 cm water. Clinically, continues to show improvement. Anticipate that he will settle out at a higher baseline creatinine that he came in with due to the significant injury sustained. (Previous baseline creatinine seems to be about 1.0-1.2). 2. 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 Status post secondary closure of sternal wound on 10/1. 3. Cardiogenic/hypovolemic (hemorrhagic) shock. On pressors with fairly tolerable BPs in spite of UF- on Epi/Dopamine 4. VDRF ongoing efforts at weaning/extubation, anticipate ultrafiltration so far will benefit this. 5. Suspected PE: On bivalrudin due to HIT concerns  Zetta Bills, MD 11/10/2011, 7:32 AM

## 2011-11-10 NOTE — Procedures (Signed)
Extubation Procedure Note  Patient Details:   Name: Gabriel Tran DOB: May 23, 1955 MRN: [id number]   Airway Documentation:     Evaluation  O2 sats: stable throughout Complications: No apparent complications Patient did tolerate procedure well. Bilateral Breath Sounds: Rhonchi Suctioning: Oral;Airway Yes Pt extubated to a 4lpm [place], Sp02 96%, rr 20, Pt is able to vocalize. RT will continue to monitor pt progress.   Melanee Spry 11/10/2011, 9:22 AM

## 2011-11-10 NOTE — Progress Notes (Signed)
Patient ID: Gabriel Tran, male   DOB: 24-Mar-1955, 56 y.o.   MRN: [id number] [facility] DAILY PROGRESS NOTE                   [address]            [place] [id number]          [phone]      4 Days Post-Op Procedure(s) (LRB): INSERTION OF DIALYSIS CATHETER (Right) MEDIASTINAL EXPLORATION (N/A)  Total Length of Stay:  LOS: 12 days   Subjective: Alert and following commands  Objective: Vital signs in last 24 hours: Temp:  [97.8 F (36.6 C)-98.6 F (37 C)] 98.4 F (36.9 C) (10/05 0734) Pulse Rate:  [47-143] 129  (10/05 0800) Cardiac Rhythm:  [-] Atrial flutter (10/05 0800) Resp:  [14-32] 18  (10/05 0800) BP: (92-94)/(48-51) 92/48 mmHg (10/04 1100) SpO2:  [98 %-100 %] 99 % (10/05 0800) Arterial Line BP: (83-234)/(43-96) 87/45 mmHg (10/05 0800) FiO2 (%):  [40 %-50 %] 40 % (10/05 0800) Weight:  [250 lb 7.1 oz (113.6 kg)] 250 lb 7.1 oz (113.6 kg) (10/05 0500)  Filed Weights   11/07/11 0500 11/08/11 0500 11/10/11 0500  Weight: 264 lb 5.3 oz (119.9 kg) 263 lb 0.1 oz (119.3 kg) 250 lb 7.1 oz (113.6 kg)    Weight change:    Hemodynamic parameters for last 24 hours: CVP:  [6 mmHg-16 mmHg] 16 mmHg  Intake/Output from previous day: 10/04 0701 - 10/05 0700 In: 2390.9 [I.V.:2040.9; NG/GT:150; IV Piggyback:200] Out: 5992 [Urine:110; Emesis/NG output:750; Chest Tube:160]  Intake/Output this shift: Total I/O In: 82.5 [I.V.:82.5] Out: 305 [Urine:5; Other:240; Chest Tube:60]  Current Meds: Scheduled Meds:   . amiodarone  150 mg Intravenous Once  . antiseptic oral rinse  15 mL Mouth Rinse QID  . cefTAZidime (FORTAZ)  IV  2 g Intravenous Q12H  . chlorhexidine  15 mL Mouth Rinse BID  . diltiazem  10 mg Intravenous Once  . docusate sodium  200 mg Oral Daily  . insulin aspart  0-24 Units Subcutaneous Q4H  . levalbuterol  0.63 mg Nebulization Q6H  . pantoprazole (PROTONIX) IV  40 mg Intravenous Q24H  . sodium chloride  10-40 mL Intracatheter Q12H  . DISCONTD:  levalbuterol  1.25 mg Nebulization Q6H   Continuous Infusions:   . sodium chloride 20 mL/hr at 11/10/11 0800  . amiodarone (NEXTERONE PREMIX) 360 mg/200 mL dextrose 30 mg/hr (11/10/11 0800)  . bivalirudin (ANGIOMAX) infusion 0.5 mg/mL (Non-ACS indications) 0.015 mg/kg/hr (11/10/11 0754)  . dexmedetomidine 0.4 mcg/kg/hr (11/10/11 0800)  . diltiazem (CARDIZEM) infusion 5 mg/hr (11/10/11 0900)  . dialysis replacement fluid (prismasate) 400 mL/hr at 11/10/11 0059  . dialysis replacement fluid (prismasate) 400 mL/hr at 11/10/11 0059  . dialysate (PRISMASATE) 2,000 mL/hr at 11/10/11 0709  . DISCONTD: DOPamine 2.5 mcg/kg/min (11/10/11 0800)  . DISCONTD: epinephrine Stopped (11/07/11 1130)  . DISCONTD: feeding supplement (OSMOLITE 1.2 CAL) Stopped (11/09/11 0900)  . DISCONTD: milrinone 0.25 mcg/kg/min (11/10/11 0800)  . DISCONTD: phenylephrine (NEO-SYNEPHRINE) Adult infusion Stopped (11/04/11 1230)   PRN Meds:.dextrose, fentaNYL, metoprolol, ondansetron (ZOFRAN) IV, sodium chloride, sodium chloride, DISCONTD: fentaNYL, DISCONTD: midazolam, DISCONTD:  morphine injection, DISCONTD: ondansetron, DISCONTD: oxyCODONE  General appearance: alert and cooperative Neurologic: intact Heart: a flutter 120-130 Lungs: diminished breath sounds bibasilar Abdomen: soft, non-tender; bowel sounds normal; no masses,  no organomegaly Extremities: extremities normal, atraumatic, no cyanosis or edema and Homans sign is negative, no sign of DVT  Lab Results: CBC: Basename 11/10/11  0400 11/09/11 0443  WBC 17.9* 18.6*  HGB 8.6* 8.9*  HCT 25.6* 25.6*  PLT 179 153   BMET:  Basename 11/10/11 0400 11/09/11 1600  NA 135 135  K 4.6 4.3  CL 102 102  CO2 26 24  GLUCOSE 131* 122*  BUN 25* 25*  CREATININE 2.32* 2.05*  CALCIUM 8.5 8.1*    PT/INR:  Basename 11/10/11 0400  LABPROT 16.2*  INR 1.33   Radiology: Dg Chest Port 1 View  11/10/2011  *RADIOLOGY REPORT*  Clinical Data: Aortic valve replacement  PORTABLE  CHEST - 1 VIEW  Comparison:   the previous day's study  Findings: Endotracheal tube, nasogastric tube, bilateral chest tubes, left subclavian central line, and mediastinal drain are stable in position.  The right IJ catheter has been removed.  No pneumothorax.  Skin staples project over the right scapula.  Low volumes with persistent left retrocardiac consolidation / atelectasis.  Some increase in central pulmonary vascular congestion.  Question small left pleural effusion. Previous median sternotomy.  IMPRESSION:  1.  Removal of right IJ central line. 2. Mild central pulmonary vascular congestion.   Original Report Authenticated By: Osa Craver, M.D.    Dg Chest Port 1 View  11/09/2011  *RADIOLOGY REPORT*  Clinical Data: Evaluate endotracheal tube placement.  PORTABLE CHEST - 1 VIEW  Comparison: 11/09/2011  Findings: Endotracheal tube is approximately 3 cm above the carina. Right jugular central line at the junction of the jugular vein and innominate vein.  There is a left subclavian central line with the tip in the SVC region.  There are bilateral chest drains with a small area of lucency at the left costophrenic angle which could represent a small focus of pleural air.  Evidence for mediastinal drains.  Low lung volumes with left basilar densities.  Nasogastric tube extends into the abdomen.  IMPRESSION: Support apparatuses appear to be grossly stable in position.  Low lung volumes.  There is a small focus of lucency at the left costophrenic angle and cannot exclude a small amount of pleural air in this location.  Persistent left basilar densities could represent volume loss.   Original Report Authenticated By: Richarda Overlie, M.D.    Dg Chest Port 1 View  11/09/2011  *RADIOLOGY REPORT*  Clinical Data: AVR, intubated.  PORTABLE CHEST - 1 VIEW  Comparison: 11/08/2011  Findings: Endotracheal tube tip projects 4.8 cm proximal to the carina.  NG tube descends below the level of the image.  Bilateral chest  tubes and mediastinal drain are in place.  Right IJ sheath with interval removal of the Swan-Ganz catheter.  Left subclavian central venous catheter tip projects over the proximal SVC.  Status post median sternotomy and AVR.  Degraded by rotation. Hypoaeration with interstitial and vascular crowding.  Mild bibasilar opacities, likely atelectasis.  No interval osseous change. Skin staples project over the midline and right axilla. Right axillary surgical clips.  IMPRESSION: Interval removal of the Swan-Ganz catheter placement of a right IJ sheath.  Endotracheal tube tip projects 4.8 cm proximal to the carina. Support devices otherwise unchanged.  Postoperative changes and bibasilar opacities, likely atelectasis.   Original Report Authenticated By: Waneta Martins, M.D.      Assessment/Plan: S/P Procedure(s) (LRB): INSERTION OF DIALYSIS CATHETER (Right) MEDIASTINAL EXPLORATION (N/A) Failed attempt to rapid a pace, starting Cardizem loaded with pacerone Weaning vent today to extubation   Twania Bujak B 11/10/2011 9:01 AM

## 2011-11-10 NOTE — Progress Notes (Signed)
Patient ID: Gabriel Tran, male   DOB: 02/23/1955, 56 y.o.   MRN: [id number]                   [address]            [place] [address]          [phone]     4 Days Post-Op Procedure(s) (LRB): INSERTION OF DIALYSIS CATHETER (Right) MEDIASTINAL EXPLORATION (N/A)  Total Length of Stay:  LOS: 12 days  BP 126/74  Pulse 136  Temp 98.8 F (37.1 C) (Oral)  Resp 22  Ht 5\' 8"  (1.727 m)  Wt 250 lb 7.1 oz (113.6 kg)  BMI 38.08 kg/m2  SpO2 98%     . sodium chloride 20 mL/hr at 11/10/11 0800  . amiodarone (NEXTERONE PREMIX) 360 mg/200 mL dextrose 30 mg/hr (11/10/11 0800)  . bivalirudin (ANGIOMAX) infusion 0.5 mg/mL (Non-ACS indications) 0.015 mg/kg/hr (11/10/11 0754)  . dexmedetomidine Stopped (11/10/11 1100)  . diltiazem (CARDIZEM) infusion 15 mg/hr (11/10/11 1751)  . dialysis replacement fluid (prismasate) 400 mL/hr at 11/10/11 1422  . dialysis replacement fluid (prismasate) 400 mL/hr at 11/10/11 1443  . dialysate (PRISMASATE) 2,000 mL/hr at 11/10/11 1750  . DISCONTD: DOPamine Stopped (11/10/11 1100)  . DISCONTD: epinephrine Stopped (11/07/11 1130)  . DISCONTD: milrinone Stopped (11/10/11 1300)  . DISCONTD: phenylephrine (NEO-SYNEPHRINE) Adult infusion Stopped (11/04/11 1230)     Lab Results  Component Value Date   WBC 17.9* 11/10/2011   HGB 8.6* 11/10/2011   HCT 25.6* 11/10/2011   PLT 179 11/10/2011   GLUCOSE 115* 11/10/2011   CHOL 191 10/30/2011   TRIG 106 10/30/2011   HDL 53 10/30/2011   LDLDIRECT 142.8 08/31/2011   LDLCALC 117* 10/30/2011   ALT 484* 11/10/2011   AST 83* 11/10/2011   NA 132* 11/10/2011   K 4.4 11/10/2011   CL 98 11/10/2011   CREATININE 2.39* 11/10/2011   BUN 24* 11/10/2011   CO2 23 11/10/2011   TSH 1.148 10/29/2011   PSA 0.72 08/31/2011   INR 1.33 11/10/2011   HGBA1C 5.9* 10/29/2011   MICROALBUR 2.8* 08/31/2011   Tolerating extubation Still in a flutter 123-130, not responsive to rapid pacing On cardizem drip will re bolus  Delight Ovens MD  Beeper [phone] Office [phone] 11/10/2011 6:55 PM

## 2011-11-11 ENCOUNTER — Inpatient Hospital Stay (HOSPITAL_COMMUNITY): Payer: PRIVATE HEALTH INSURANCE

## 2011-11-11 LAB — COMPREHENSIVE METABOLIC PANEL
ALT: 380 U/L — ABNORMAL HIGH (ref 0–53)
AST: 75 U/L — ABNORMAL HIGH (ref 0–37)
Albumin: 2.4 g/dL — ABNORMAL LOW (ref 3.5–5.2)
Alkaline Phosphatase: 194 U/L — ABNORMAL HIGH (ref 39–117)
BUN: 24 mg/dL — ABNORMAL HIGH (ref 6–23)
CO2: 23 mEq/L (ref 19–32)
Calcium: 8.7 mg/dL (ref 8.4–10.5)
Chloride: 100 mEq/L (ref 96–112)
Creatinine, Ser: 2.43 mg/dL — ABNORMAL HIGH (ref 0.50–1.35)
GFR calc Af Amer: 33 mL/min — ABNORMAL LOW (ref >90)
GFR calc non Af Amer: 29 mL/min — ABNORMAL LOW (ref >90)
Glucose, Bld: 118 mg/dL — ABNORMAL HIGH (ref 70–99)
Potassium: 4.2 mEq/L (ref 3.5–5.1)
Sodium: 134 mEq/L — ABNORMAL LOW (ref 135–145)
Total Bilirubin: 1.8 mg/dL — ABNORMAL HIGH (ref 0.3–1.2)
Total Protein: 6.4 g/dL (ref 6.0–8.3)

## 2011-11-11 LAB — RENAL FUNCTION PANEL
Albumin: 2.5 g/dL — ABNORMAL LOW (ref 3.5–5.2)
BUN: 21 mg/dL (ref 6–23)
Chloride: 99 mEq/L (ref 96–112)
Creatinine, Ser: 2.32 mg/dL — ABNORMAL HIGH (ref 0.50–1.35)
GFR calc non Af Amer: 30 mL/min — ABNORMAL LOW (ref >90)
Phosphorus: 3.9 mg/dL (ref 2.3–4.6)
Potassium: 4.2 mEq/L (ref 3.5–5.1)

## 2011-11-11 LAB — CBC
MCHC: 33.6 g/dL (ref 30.0–36.0)
Platelets: 258 10*3/uL (ref 150–400)
RDW: 17.2 % — ABNORMAL HIGH (ref 11.5–15.5)
WBC: 22.4 10*3/uL — ABNORMAL HIGH (ref 4.0–10.5)

## 2011-11-11 LAB — GLUCOSE, CAPILLARY
Glucose-Capillary: 111 mg/dL — ABNORMAL HIGH (ref 70–99)
Glucose-Capillary: 105 mg/dL — ABNORMAL HIGH (ref 70–99)
Glucose-Capillary: 141 mg/dL — ABNORMAL HIGH (ref 70–99)

## 2011-11-11 LAB — PROTIME-INR
INR: 1.4 (ref 0.00–1.49)
Prothrombin Time: 16.8 seconds — ABNORMAL HIGH (ref 11.6–15.2)

## 2011-11-11 LAB — PHOSPHORUS: Phosphorus: 4.1 mg/dL (ref 2.3–4.6)

## 2011-11-11 LAB — APTT: aPTT: 55 seconds — ABNORMAL HIGH (ref 24–37)

## 2011-11-11 MED ORDER — HEPARIN SODIUM (PORCINE) 1000 UNIT/ML IJ SOLN
INTRAMUSCULAR | Status: AC
  Administered 2011-11-11: 1000 [IU]
  Filled 2011-11-11: qty 6

## 2011-11-11 MED ORDER — PANTOPRAZOLE SODIUM 40 MG PO TBEC
40.0000 mg | DELAYED_RELEASE_TABLET | Freq: Every day | ORAL | Status: DC
  Administered 2011-11-11 – 2011-11-16 (×5): 40 mg via ORAL
  Filled 2011-11-11 (×5): qty 1

## 2011-11-11 NOTE — Progress Notes (Addendum)
Patient ID: DANELLE SANDSTROM, male   DOB: 03-Feb-1956, 56 y.o.   MRN: [id number]   [facility] Progress Note    Subjective:   Extubated yesterday without problems. No acute events overnight and CRRT continues without problems.   Objective:   BP 139/65  Pulse 86  Temp 98.1 F (36.7 C) (Oral)  Resp 20  Ht 5\' 8"  (1.727 m)  Wt 107.9 kg (237 lb 14 oz)  BMI 36.17 kg/m2  SpO2 94%  Intake/Output Summary (Last 24 hours) at 11/11/11 0743 Last data filed at 11/11/11 0700  Gross per 24 hour  Intake 2923.62 ml  Output   5531 ml  Net -2607.38 ml   Weight change: -5.7 kg (-12 lb 9.1 oz)  Physical Exam: WUJ:[id number] resting in bed AOZ:HYQMV RRR, normal S1 and S2  Resp:Coarse BS bilaterally, no rales/rhonchi HQI:ONGE, obese, NT, BS normal Ext:2+ LE edema- continues to show slow improvement  Imaging: Dg Chest Port 1 View  11/10/2011  *RADIOLOGY REPORT*  Clinical Data: Aortic valve replacement  PORTABLE CHEST - 1 VIEW  Comparison:   the previous day's study  Findings: Endotracheal tube, nasogastric tube, bilateral chest tubes, left subclavian central line, and mediastinal drain are stable in position.  The right IJ catheter has been removed.  No pneumothorax.  Skin staples project over the right scapula.  Low volumes with persistent left retrocardiac consolidation / atelectasis.  Some increase in central pulmonary vascular congestion.  Question small left pleural effusion. Previous median sternotomy.  IMPRESSION:  1.  Removal of right IJ central line. 2. Mild central pulmonary vascular congestion.   Original Report Authenticated By: Osa Craver, M.D.    Dg Chest Port 1 View  11/09/2011  *RADIOLOGY REPORT*  Clinical Data: Evaluate endotracheal tube placement.  PORTABLE CHEST - 1 VIEW  Comparison: 11/09/2011  Findings: Endotracheal tube is approximately 3 cm above the carina. Right jugular central line at the junction of the jugular vein and innominate vein.  There is a  left subclavian central line with the tip in the SVC region.  There are bilateral chest drains with a small area of lucency at the left costophrenic angle which could represent a small focus of pleural air.  Evidence for mediastinal drains.  Low lung volumes with left basilar densities.  Nasogastric tube extends into the abdomen.  IMPRESSION: Support apparatuses appear to be grossly stable in position.  Low lung volumes.  There is a small focus of lucency at the left costophrenic angle and cannot exclude a small amount of pleural air in this location.  Persistent left basilar densities could represent volume loss.   Original Report Authenticated By: Richarda Overlie, M.D.     Labs: BMET  Lab 11/11/11 0439 11/10/11 1800 11/10/11 0400 11/09/11 1600 11/09/11 0443 11/08/11 1950 11/08/11 0450  NA 134* 132* 135 135 135 138 138  K 4.2 4.4 4.6 4.3 4.2 4.2 4.0  CL 100 98 102 102 101 104 105  CO2 23 23 26 24 25 24 25   GLUCOSE 118* 115* 131* 122* 122* 127* 167*  BUN 24* 24* 25* 25* 29* 30* 42*  CREATININE 2.43* 2.39* 2.32* 2.05* 2.25* 2.36* 2.82*  ALB -- -- -- -- -- -- --  CALCIUM 8.7 8.7 8.5 8.1* 8.5 8.3* 8.0*  PHOS 4.1 3.0 3.6 3.0 3.2 3.2 3.4   CBC  Lab 11/11/11 0439 11/10/11 0400 11/09/11 0443 11/08/11 0450  WBC 22.4* 17.9* 18.6* 11.8*  NEUTROABS -- -- -- --  HGB 8.7* 8.6*  8.9* 7.7*  HCT 25.9* 25.6* 25.6* 22.2*  MCV 85.2 84.8 82.3 82.8  PLT 258 179 153 118*    Medications:      . antiseptic oral rinse  15 mL Mouth Rinse QID  . cefTAZidime (FORTAZ)  IV  2 g Intravenous Q12H  . chlorhexidine  15 mL Mouth Rinse BID  . diltiazem  10 mg Intravenous Once  . diltiazem  10 mg Intravenous Once  . docusate sodium  200 mg Oral Daily  . insulin aspart  0-24 Units Subcutaneous Q4H  . levalbuterol  0.63 mg Nebulization QID  . pantoprazole (PROTONIX) IV  40 mg Intravenous Q24H  . sodium chloride  10-40 mL Intracatheter Q12H  . DISCONTD: levalbuterol  0.63 mg Nebulization Q6H  . DISCONTD: levalbuterol   1.25 mg Nebulization Q6H     Assessment/ Plan:   1. 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 Urine output in the anuric range (<142mL/24hrs noted) and no clinical evidence of renal recovery as yet- will increase UF goal to -225mL/hr and will re-evaluate transitioning modality to IHD early next week if feasible. Anticipate that he will settle out at a higher baseline creatinine that he came in with due to the significant injury sustained. (Previous baseline creatinine seems to be about 1.0-1.2). 2. 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 Status post secondary closure of sternal wound on 10/1. 3. Cardiogenic/hypovolemic (hemorrhagic) shock. On pressors with fairly tolerable BPs in spite of UF- on Epi/Dopamine 4. Leukocytosis: Urine culture today- on fortaz. No focal infiltrate seen on CXR and catheter exit site appears to be "benign" 5. Suspected PE: On bivalrudin due to HIT concerns  Zetta Bills, MD 11/11/2011, 7:43 AM

## 2011-11-11 NOTE — Progress Notes (Signed)
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 Went back to OR 9/28 emergently for cardiac tamponade/hematoma evacuation.  S/p secondary closure of sternal wound on 10/1.  Anticoagulation:  PE on Angiomax, No HIT (Platelet antibody and SRA negative). Goal only 50-55 sec,  Remains in goal range on 0.015 mg/kg/hr PTT today 55, same result as yesterday.  PLTC 258K (179<153<118<103<78K). No bleeding reported.   Goal: APTT 50-55sec  Plan: Continue Angiomax (bivalirudin) IV infusion at 0.015mg /kg/hr. Monitor aPTT and CBC daily.   Noah Delaine, RPh Clinical Pharmacist Pager: [phone]  11/11/2011 1:56 PM

## 2011-11-11 NOTE — Progress Notes (Signed)
Patient ID: Gabriel Tran, male   DOB: 01-Jan-1956, 56 y.o.   MRN: [id number] TCTS DAILY PROGRESS NOTE                   [address]            [place] [id number]          [phone]      5 Days Post-Op Procedure(s) (LRB): INSERTION OF DIALYSIS CATHETER (Right) MEDIASTINAL EXPLORATION (N/A)  Total Length of Stay:  LOS: 13 days   Subjective: Awake alert, tolerating extubation, wants something to eat  Objective: Vital signs in last 24 hours: Temp:  [98.1 F (36.7 C)-98.8 F (37.1 C)] 98.1 F (36.7 C) (10/06 0400) Pulse Rate:  [80-139] 85  (10/06 0800) Cardiac Rhythm:  [-] Atrial flutter (10/06 0300) Resp:  [15-25] 23  (10/06 0800) BP: (101-158)/(52-89) 139/65 mmHg (10/06 0300) SpO2:  [93 %-99 %] 94 % (10/06 0800) Arterial Line BP: (117-187)/(52-86) 187/78 mmHg (10/06 0800) FiO2 (%):  [40 %] 40 % (10/05 0900) Weight:  [237 lb 14 oz (107.9 kg)] 237 lb 14 oz (107.9 kg) (10/06 0500)  Filed Weights   11/08/11 0500 11/10/11 0500 11/11/11 0500  Weight: 263 lb 0.1 oz (119.3 kg) 250 lb 7.1 oz (113.6 kg) 237 lb 14 oz (107.9 kg)    Weight change: -12 lb 9.1 oz (-5.7 kg)   Hemodynamic parameters for last 24 hours: CVP:  [8 mmHg-19 mmHg] 16 mmHg  Intake/Output from previous day: 10/05 0701 - 10/06 0700 In: 2923.6 [P.O.:1260; I.V.:1553.6; IV Piggyback:110] Out: 5531 [Urine:55; Chest Tube:240]  Intake/Output this shift: Total I/O In: -  Out: 249 [Other:249]  Current Meds: Scheduled Meds:   . antiseptic oral rinse  15 mL Mouth Rinse QID  . cefTAZidime (FORTAZ)  IV  2 g Intravenous Q12H  . chlorhexidine  15 mL Mouth Rinse BID  . diltiazem  10 mg Intravenous Once  . diltiazem  10 mg Intravenous Once  . docusate sodium  200 mg Oral Daily  . insulin aspart  0-24 Units Subcutaneous Q4H  . levalbuterol  0.63 mg Nebulization QID  . pantoprazole (PROTONIX) IV  40 mg Intravenous Q24H  . sodium chloride  10-40 mL Intracatheter Q12H  . DISCONTD: levalbuterol   0.63 mg Nebulization Q6H  . DISCONTD: levalbuterol  1.25 mg Nebulization Q6H   Continuous Infusions:   . sodium chloride 20 mL/hr at 11/10/11 1924  . amiodarone (NEXTERONE PREMIX) 360 mg/200 mL dextrose 30 mg/hr (11/11/11 0610)  . bivalirudin (ANGIOMAX) infusion 0.5 mg/mL (Non-ACS indications) 0.015 mg/kg/hr (11/10/11 0754)  . dexmedetomidine Stopped (11/10/11 1100)  . diltiazem (CARDIZEM) infusion 15 mg/hr (11/11/11 0630)  . dialysis replacement fluid (prismasate) 400 mL/hr at 11/11/11 0415  . dialysis replacement fluid (prismasate) 400 mL/hr at 11/11/11 0512  . dialysate (PRISMASATE) 2,000 mL/hr at 11/11/11 0300  . DISCONTD: DOPamine Stopped (11/10/11 1100)  . DISCONTD: epinephrine Stopped (11/07/11 1130)  . DISCONTD: milrinone Stopped (11/10/11 1300)  . DISCONTD: phenylephrine (NEO-SYNEPHRINE) Adult infusion Stopped (11/04/11 1230)   PRN Meds:.fentaNYL, metoprolol, ondansetron (ZOFRAN) IV, oxyCODONE, phenol, sodium chloride, sodium chloride, DISCONTD: fentaNYL, DISCONTD: fentaNYL, DISCONTD: midazolam, DISCONTD:  morphine injection, DISCONTD: ondansetron, DISCONTD: oxyCODONE  General appearance: alert and cooperative Neurologic: intact Heart: regular rate and rhythm, S1, S2 normal, no murmur, click, rub or gallop Lungs: clear to auscultation bilaterally Abdomen: soft, non-tender; bowel sounds normal; no masses,  no organomegaly Extremities: extremities normal, atraumatic, no cyanosis or edema and Homans sign is  negative, no sign of DVT Wound: sternal and rt chest intact withou appearence of infection  Lab Results: CBC: Basename 11/11/11 0439 11/10/11 0400  WBC 22.4* 17.9*  HGB 8.7* 8.6*  HCT 25.9* 25.6*  PLT 258 179   BMET:  Basename 11/11/11 0439 11/10/11 1800  NA 134* 132*  K 4.2 4.4  CL 100 98  CO2 23 23  GLUCOSE 118* 115*  BUN 24* 24*  CREATININE 2.43* 2.39*  CALCIUM 8.7 8.7    PT/INR:  Basename 11/11/11 0439  LABPROT 16.8*  INR 1.40   Radiology: Dg Chest  Port 1 View  11/10/2011  *RADIOLOGY REPORT*  Clinical Data: Aortic valve replacement  PORTABLE CHEST - 1 VIEW  Comparison:   the previous day's study  Findings: Endotracheal tube, nasogastric tube, bilateral chest tubes, left subclavian central line, and mediastinal drain are stable in position.  The right IJ catheter has been removed.  No pneumothorax.  Skin staples project over the right scapula.  Low volumes with persistent left retrocardiac consolidation / atelectasis.  Some increase in central pulmonary vascular congestion.  Question small left pleural effusion. Previous median sternotomy.  IMPRESSION:  1.  Removal of right IJ central line. 2. Mild central pulmonary vascular congestion.   Original Report Authenticated By: Osa Craver, M.D.      Assessment/Plan: S/P Procedure(s) (LRB): INSERTION OF DIALYSIS CATHETER (Right) MEDIASTINAL EXPLORATION (N/A) d/c tubes/lines advance diet Remove mt tube today others tomorrow Still with leucocytosis, without obvious infection D/c aline Now in sinus , decrease Caredizem    Mozelle Remlinger B 11/11/2011 8:15 AM

## 2011-11-12 ENCOUNTER — Inpatient Hospital Stay (HOSPITAL_COMMUNITY): Payer: PRIVATE HEALTH INSURANCE

## 2011-11-12 LAB — RENAL FUNCTION PANEL
Albumin: 2.5 g/dL — ABNORMAL LOW (ref 3.5–5.2)
Albumin: 2.5 g/dL — ABNORMAL LOW (ref 3.5–5.2)
CO2: 25 mEq/L (ref 19–32)
Chloride: 99 mEq/L (ref 96–112)
Chloride: 96 mEq/L (ref 96–112)
GFR calc Af Amer: 32 mL/min — ABNORMAL LOW (ref >90)
GFR calc Af Amer: 29 mL/min — ABNORMAL LOW (ref >90)
GFR calc non Af Amer: 27 mL/min — ABNORMAL LOW (ref >90)
Phosphorus: 3.6 mg/dL (ref 2.3–4.6)
Potassium: 4.8 mEq/L (ref 3.5–5.1)
Sodium: 133 mEq/L — ABNORMAL LOW (ref 135–145)
Sodium: 131 mEq/L — ABNORMAL LOW (ref 135–145)

## 2011-11-12 LAB — URINE CULTURE
Colony Count: NO GROWTH
Culture: NO GROWTH
Special Requests: NORMAL

## 2011-11-12 LAB — CBC
HCT: 27.1 % — ABNORMAL LOW (ref 39.0–52.0)
MCHC: 32.8 g/dL (ref 30.0–36.0)
MCV: 86.6 fL (ref 78.0–100.0)
Platelets: 267 10*3/uL (ref 150–400)
RDW: 17.2 % — ABNORMAL HIGH (ref 11.5–15.5)
WBC: 18.4 10*3/uL — ABNORMAL HIGH (ref 4.0–10.5)

## 2011-11-12 LAB — GLUCOSE, CAPILLARY
Glucose-Capillary: 103 mg/dL — ABNORMAL HIGH (ref 70–99)
Glucose-Capillary: 106 mg/dL — ABNORMAL HIGH (ref 70–99)
Glucose-Capillary: 108 mg/dL — ABNORMAL HIGH (ref 70–99)
Glucose-Capillary: 108 mg/dL — ABNORMAL HIGH (ref 70–99)
Glucose-Capillary: 113 mg/dL — ABNORMAL HIGH (ref 70–99)
Glucose-Capillary: 116 mg/dL — ABNORMAL HIGH (ref 70–99)

## 2011-11-12 LAB — PROTIME-INR: INR: 1.35 (ref 0.00–1.49)

## 2011-11-12 MED ORDER — LEVALBUTEROL HCL 0.63 MG/3ML IN NEBU
0.6300 mg | INHALATION_SOLUTION | Freq: Three times a day (TID) | RESPIRATORY_TRACT | Status: DC
  Administered 2011-11-12 (×2): 0.63 mg via RESPIRATORY_TRACT
  Filled 2011-11-12 (×6): qty 3

## 2011-11-12 MED ORDER — WARFARIN SODIUM 2 MG PO TABS
2.0000 mg | ORAL_TABLET | Freq: Every day | ORAL | Status: DC
  Administered 2011-11-12 – 2011-11-14 (×3): 2 mg via ORAL
  Filled 2011-11-12 (×3): qty 1

## 2011-11-12 MED ORDER — PRO-STAT SUGAR FREE PO LIQD
30.0000 mL | Freq: Two times a day (BID) | ORAL | Status: DC
  Administered 2011-11-16: 30 mL via ORAL
  Filled 2011-11-12 (×9): qty 30

## 2011-11-12 MED ORDER — NEPRO/CARBSTEADY PO LIQD
237.0000 mL | Freq: Three times a day (TID) | ORAL | Status: DC
  Administered 2011-11-12 – 2011-11-15 (×5): 237 mL via ORAL
  Filled 2011-11-12 (×19): qty 237

## 2011-11-12 MED ORDER — AMIODARONE HCL 200 MG PO TABS
200.0000 mg | ORAL_TABLET | Freq: Two times a day (BID) | ORAL | Status: DC
  Administered 2011-11-12 – 2011-11-16 (×9): 200 mg via ORAL
  Filled 2011-11-12 (×10): qty 1

## 2011-11-12 MED ORDER — CLONIDINE HCL 0.1 MG PO TABS
0.1000 mg | ORAL_TABLET | ORAL | Status: DC | PRN
  Administered 2011-11-16: 0.1 mg via ORAL
  Filled 2011-11-12: qty 1

## 2011-11-12 MED ORDER — TEMAZEPAM 15 MG PO CAPS
15.0000 mg | ORAL_CAPSULE | Freq: Every evening | ORAL | Status: DC | PRN
  Administered 2011-11-12 – 2011-11-15 (×4): 15 mg via ORAL
  Filled 2011-11-12 (×4): qty 1

## 2011-11-12 MED ORDER — CLONIDINE HCL 0.1 MG PO TABS
0.1000 mg | ORAL_TABLET | ORAL | Status: DC
  Filled 2011-11-12 (×7): qty 1

## 2011-11-12 MED ORDER — WARFARIN - PHYSICIAN DOSING INPATIENT
Freq: Every day | Status: DC
  Administered 2011-11-15: 18:00:00

## 2011-11-12 MED ORDER — DILTIAZEM HCL ER 90 MG PO CP12
90.0000 mg | ORAL_CAPSULE | Freq: Two times a day (BID) | ORAL | Status: DC
  Administered 2011-11-12 – 2011-11-16 (×9): 90 mg via ORAL
  Filled 2011-11-12 (×10): qty 1

## 2011-11-12 NOTE — Progress Notes (Signed)
ANTICOAGULATION CONSULT NOTE - Follow Up Consult  Pharmacy Consult for bivalirudin Indication: Right pulmonary embolus (HIT panel negative)   Labs:  Basename 11/12/11 0445 11/11/11 1910 11/11/11 0439 11/10/11 0400  HGB 8.9* -- 8.7* --  HCT 27.1* -- 25.9* 25.6*  PLT 267 -- 258 179  APTT 55* -- 55* 55*  LABPROT 16.4* -- 16.8* 16.2*  INR 1.35 -- 1.40 1.33  HEPARINUNFRC -- -- -- --  CREATININE 2.52* 2.32* 2.43* --  CKTOTAL -- -- -- --  CKMB -- -- -- --  TROPONINI -- -- -- --    Assessment:  Gabriel Tran is a 56 year old male on Angiomax for PE (HIT panel negative). Per MD, goal only 50-55 sec. Pt remains in goal range on 0.015 mg/kg/hr aPTT today 55, same result as yesterday.  PLTC within normal limits at 267. No bleeding reported.   Goal: APTT 50-55sec  Plan: Continue Angiomax (bivalirudin) IV infusion at 0.015mg /kg/hr. Monitor aPTT and CBC daily.  Thank you,  Brett Fairy, PharmD 11/12/2011 9:37 AM

## 2011-11-12 NOTE — Progress Notes (Signed)
S:Awake and alert. Pain controlled O:BP 151/95  Pulse 74  Temp 98.4 F (36.9 C) (Oral)  Resp 18  Ht 5\' 8"  (1.727 m)  Wt 102.5 kg (225 lb 15.5 oz)  BMI 34.36 kg/m2  SpO2 97%  Intake/Output Summary (Last 24 hours) at 11/12/11 0819 Last data filed at 11/12/11 0800  Gross per 24 hour  Intake 3305.2 ml  Output   8728 ml  Net -5422.8 ml   Weight change: -5.4 kg (-11 lb 14.5 oz) Gen: Awake and alert CVS:RRR Resp:clear anteriorly Abd:+ BS NTND Ext:no edema NEURO: CNI Ox3      . amiodarone  200 mg Oral BID  . cefTAZidime (FORTAZ)  IV  2 g Intravenous Q12H  . diltiazem  90 mg Oral Q12H  . docusate sodium  200 mg Oral Daily  . heparin      . insulin aspart  0-24 Units Subcutaneous Q4H  . levalbuterol  0.63 mg Nebulization QID  . pantoprazole  40 mg Oral Q1200  . sodium chloride  10-40 mL Intracatheter Q12H  . DISCONTD: antiseptic oral rinse  15 mL Mouth Rinse QID  . DISCONTD: chlorhexidine  15 mL Mouth Rinse BID  . DISCONTD: pantoprazole (PROTONIX) IV  40 mg Intravenous Q24H   Dg Chest Port 1 View  11/12/2011  *RADIOLOGY REPORT*  Clinical Data: Status post open heart surgery.  PORTABLE CHEST - 1 VIEW  Comparison: One-view chest 11/11/2011.  Findings: The heart is enlarged, exaggerated by low lung volumes. A left subclavian line and bilateral chest tubes are stable.  New left pleural effusion is present.  There is some lateral airspace disease on the left as well.  The right hemidiaphragm remains elevated.  No other focal airspace disease is evident.  IMPRESSION:  1.  New left lateral opacity most compatible with a pleural effusion. 2.  Adjacent left-sided airspace disease likely reflects atelectasis. 3.  Stable cardiac enlargement.   Original Report Authenticated By: Jamesetta Orleans. MATTERN, M.D.    Dg Chest Port 1 View  11/11/2011  *RADIOLOGY REPORT*  Clinical Data: Bilateral chest tubes.  Follow-up.  PORTABLE CHEST - 1 VIEW  Comparison: 10/05  Findings: Bilateral chest tubes  remain in place.  No pneumothorax. Endotracheal tube and nasogastric tube have been removed.  Left subclavian central line has its tip in the SVC 3 cm above the right atrium.  Mediastinal drains remain in place.  There is mild basilar volume loss.  IMPRESSION: No pneumothorax.  Mild basilar volume loss.  Endotracheal tube and nasogastric tube removed.   Original Report Authenticated By: Thomasenia Sales, M.D.    BMET    Component Value Date/Time   NA 133* 11/12/2011 0445   K 4.5 11/12/2011 0445   CL 99 11/12/2011 0445   CO2 25 11/12/2011 0445   GLUCOSE 109* 11/12/2011 0445   BUN 21 11/12/2011 0445   CREATININE 2.52* 11/12/2011 0445   CALCIUM 9.1 11/12/2011 0445   GFRNONAA 27* 11/12/2011 0445   GFRAA 32* 11/12/2011 0445   CBC    Component Value Date/Time   WBC 18.4* 11/12/2011 0445   RBC 3.13* 11/12/2011 0445   HGB 8.9* 11/12/2011 0445   HCT 27.1* 11/12/2011 0445   PLT 267 11/12/2011 0445   MCV 86.6 11/12/2011 0445   MCH 28.4 11/12/2011 0445   MCHC 32.8 11/12/2011 0445   RDW 17.2* 11/12/2011 0445   LYMPHSABS 1.5 10/29/2011 1601   MONOABS 0.7 10/29/2011 1601   EOSABS 0.1 10/29/2011 1601   BASOSABS 0.0 10/29/2011  1601     Assessment: 1. ARF due to contrast nephropathy and ATN 2. HTN 3. SP Ascending Aortic dissection repair  Plan: 1. Switch to PO cardizem 2.  He was on metoprolol PTA, this could be resumed 3. PRN clonidine 4. DC foley 5. DC CVVHD when current filter clots   Jeniece Hannis T

## 2011-11-12 NOTE — Progress Notes (Signed)
Nutrition Follow-up  Intervention:    Add Nepro supplement 3 times daily between meals (425 kcals, 19.1 gm protein per 8 fl oz bottle)  Prostat liquid protein 30 ml PO twice daily with meals (100 kcals, 15 gm protein per dose) RD to follow for nutrition care plan  Assessment:   Patient extubated 10/5. Continues on CVVHD. Diet advanced 10/6 AM. Per wife, patient's appetite is fair. PO intake ~ 50% this AM at breakfast. He did drink some of a Nepro supplement, however, not active order in Order Management -- RD to order.  Diet Order:  Renal 80/90-03-09-1198 ml  Meds: Scheduled Meds:   . amiodarone  200 mg Oral BID  . cefTAZidime (FORTAZ)  IV  2 g Intravenous Q12H  . diltiazem  90 mg Oral Q12H  . docusate sodium  200 mg Oral Daily  . heparin      . insulin aspart  0-24 Units Subcutaneous Q4H  . levalbuterol  0.63 mg Nebulization TID  . pantoprazole  40 mg Oral Q1200  . sodium chloride  10-40 mL Intracatheter Q12H  . DISCONTD: antiseptic oral rinse  15 mL Mouth Rinse QID  . DISCONTD: chlorhexidine  15 mL Mouth Rinse BID  . DISCONTD: cloNIDine  0.1 mg Oral Q4H  . DISCONTD: levalbuterol  0.63 mg Nebulization QID  . DISCONTD: pantoprazole (PROTONIX) IV  40 mg Intravenous Q24H   Continuous Infusions:   . sodium chloride 20 mL/hr at 11/10/11 1924  . bivalirudin (ANGIOMAX) infusion 0.5 mg/mL (Non-ACS indications) 0.015 mg/kg/hr (11/12/11 0800)  . dexmedetomidine Stopped (11/10/11 1100)  . dialysis replacement fluid (prismasate) 400 mL/hr at 11/12/11 0737  . dialysis replacement fluid (prismasate) 400 mL/hr at 11/11/11 0512  . dialysate (PRISMASATE) 2,000 mL/hr at 11/12/11 1011  . DISCONTD: amiodarone (NEXTERONE PREMIX) 360 mg/200 mL dextrose 30 mg/hr (11/12/11 0800)  . DISCONTD: diltiazem (CARDIZEM) infusion 5 mg/hr (11/12/11 0800)   PRN Meds:.cloNIDine, fentaNYL, metoprolol, ondansetron (ZOFRAN) IV, oxyCODONE, phenol, sodium chloride, sodium chloride  Labs:  CMP     Component Value  Date/Time   NA 133* 11/12/2011 0445   K 4.5 11/12/2011 0445   CL 99 11/12/2011 0445   CO2 25 11/12/2011 0445   GLUCOSE 109* 11/12/2011 0445   BUN 21 11/12/2011 0445   CREATININE 2.52* 11/12/2011 0445   CALCIUM 9.1 11/12/2011 0445   PROT 6.4 11/11/2011 0439   ALBUMIN 2.5* 11/12/2011 0445   AST 75* 11/11/2011 0439   ALT 380* 11/11/2011 0439   ALKPHOS 194* 11/11/2011 0439   BILITOT 1.8* 11/11/2011 0439   GFRNONAA 27* 11/12/2011 0445   GFRAA 32* 11/12/2011 0445    Phosphorus  Date Value Range Status  11/12/2011 3.8  2.3 - 4.6 mg/dL Final    Magnesium  Date Value Range Status  11/12/2011 2.7* 1.5 - 2.5 mg/dL Final     Intake/Output Summary (Last 24 hours) at 11/12/11 1123 Last data filed at 11/12/11 1100  Gross per 24 hour  Intake 3362.9 ml  Output   8721 ml  Net -5358.1 ml    CBG (last 3)   Basename 11/12/11 0735 11/12/11 0353 11/12/11 0001  GLUCAP 103* 108* 121*    Weight Status:  102.5 kg (10/7) -- fluctuating   Re-estimated needs:  2200-2400 kcals, 130-140 gm protein  Nutrition Dx:  Inadequate Oral Intake now r/t decreased appetite as evidenced by wife report, ongoing  New Goal:  Oral intake with meals & supplements to meet >/= 90% of estimated nutrition needs, progressing  Monitor:  PO & supplemental intake, weight, labs, I/O's  Kirkland Hun, RD, LDN Pager #: [phone] After-Hours Pager #: [phone]

## 2011-11-12 NOTE — Progress Notes (Signed)
TCTS BRIEF SICU PROGRESS NOTE  6 Days Post-Op  S/P Procedure(s) (LRB): INSERTION OF DIALYSIS CATHETER (Right) MEDIASTINAL EXPLORATION (N/A)   Stable day  Plan: Continue current plan  OWEN,CLARENCE H 11/12/2011 7:02 PM

## 2011-11-12 NOTE — Progress Notes (Signed)
6 Days Post-Op Procedure(s) (LRB): INSERTION OF DIALYSIS CATHETER (Right) MEDIASTINAL EXPLORATION (N/A) Subjective: Lungs clear NSR, well perfused CVVH to stop- remove fem vein catheter and OOB Gram neg pneumonia on  Objective: Vital signs in last 24 hours: Temp:  [97.5 F (36.4 C)-98.8 F (37.1 C)] 98.7 F (37.1 C) (10/07 1540) Pulse Rate:  [70-97] 88  (10/07 1900) Cardiac Rhythm:  [-] Normal sinus rhythm (10/07 1200) Resp:  [16-26] 26  (10/07 1900) BP: (136-167)/(63-95) 142/75 mmHg (10/07 1800) SpO2:  [95 %-98 %] 95 % (10/07 1900) Weight:  [225 lb 15.5 oz (102.5 kg)] 225 lb 15.5 oz (102.5 kg) (10/07 0500)  Hemodynamic parameters for last 24 hours: CVP:  [8 mmHg-12 mmHg] 10 mmHg  Intake/Output from previous day: 10/06 0701 - 10/07 0700 In: 3300.2 [P.O.:2100; I.V.:1050.2; IV Piggyback:150] Out: 8599 [Urine:40; Chest Tube:540] Intake/Output this shift:    Neuro intact  Lab Results:  Basename 11/12/11 0445 11/11/11 0439  WBC 18.4* 22.4*  HGB 8.9* 8.7*  HCT 27.1* 25.9*  PLT 267 258   BMET:  Basename 11/12/11 1600 11/12/11 0445  NA 131* 133*  K 4.8 4.5  CL 96 99  CO2 24 25  GLUCOSE 114* 109*  BUN 26* 21  CREATININE 2.69* 2.52*  CALCIUM 8.8 9.1    PT/INR:  Basename 11/12/11 0445  LABPROT 16.4*  INR 1.35   ABG    Component Value Date/Time   PHART 7.450 11/10/2011 0404   HCO3 25.5* 11/10/2011 0404   TCO2 27 11/10/2011 0404   ACIDBASEDEF 2.0 11/07/2011 0841   O2SAT 93.0 11/10/2011 0404   CBG (last 3)   Basename 11/12/11 1542 11/12/11 1151 11/12/11 0735  GLUCAP 113* 116* 103*    Assessment/Plan: S/P Procedure(s) (LRB): INSERTION OF DIALYSIS CATHETER (Right) MEDIASTINAL EXPLORATION (N/A) Adv diet, activity, PT Start po coumadin slowly, wean bivalirudin when INR bumps   LOS: 14 days    VAN TRIGT III,PETER 11/12/2011

## 2011-11-13 ENCOUNTER — Inpatient Hospital Stay (HOSPITAL_COMMUNITY): Payer: PRIVATE HEALTH INSURANCE

## 2011-11-13 ENCOUNTER — Encounter (HOSPITAL_COMMUNITY): Payer: Self-pay | Admitting: Anesthesiology

## 2011-11-13 ENCOUNTER — Encounter (HOSPITAL_COMMUNITY)
Admission: EM | Disposition: A | Discharge status: Other Acute Inpatient Hospital | Payer: Self-pay | Source: Home / Self Care | Attending: Cardiothoracic Surgery

## 2011-11-13 ENCOUNTER — Inpatient Hospital Stay (HOSPITAL_COMMUNITY): Payer: PRIVATE HEALTH INSURANCE | Admitting: Anesthesiology

## 2011-11-13 DIAGNOSIS — N186 End stage renal disease: Secondary | ICD-10-CM

## 2011-11-13 HISTORY — PX: INSERTION OF DIALYSIS CATHETER: SHX1324

## 2011-11-13 LAB — COMPREHENSIVE METABOLIC PANEL
ALT: 221 U/L — ABNORMAL HIGH (ref 0–53)
AST: 57 U/L — ABNORMAL HIGH (ref 0–37)
Albumin: 2.4 g/dL — ABNORMAL LOW (ref 3.5–5.2)
Alkaline Phosphatase: 175 U/L — ABNORMAL HIGH (ref 39–117)
BUN: 42 mg/dL — ABNORMAL HIGH (ref 6–23)
CO2: 22 mEq/L (ref 19–32)
Calcium: 9 mg/dL (ref 8.4–10.5)
Chloride: 95 mEq/L — ABNORMAL LOW (ref 96–112)
Creatinine, Ser: 4.28 mg/dL — ABNORMAL HIGH (ref 0.50–1.35)
GFR calc Af Amer: 17 mL/min — ABNORMAL LOW (ref >90)
GFR calc non Af Amer: 14 mL/min — ABNORMAL LOW (ref >90)
Glucose, Bld: 99 mg/dL (ref 70–99)
Potassium: 4.9 mEq/L (ref 3.5–5.1)
Sodium: 130 mEq/L — ABNORMAL LOW (ref 135–145)
Total Bilirubin: 1.5 mg/dL — ABNORMAL HIGH (ref 0.3–1.2)
Total Protein: 6.6 g/dL (ref 6.0–8.3)

## 2011-11-13 LAB — PROTIME-INR
INR: 1.46 (ref 0.00–1.49)
Prothrombin Time: 17.3 seconds — ABNORMAL HIGH (ref 11.6–15.2)

## 2011-11-13 LAB — GLUCOSE, CAPILLARY
Glucose-Capillary: 95 mg/dL (ref 70–99)
Glucose-Capillary: 109 mg/dL — ABNORMAL HIGH (ref 70–99)

## 2011-11-13 LAB — CBC
MCH: 29.2 pg (ref 26.0–34.0)
MCHC: 33.6 g/dL (ref 30.0–36.0)
Platelets: 357 10*3/uL (ref 150–400)
RDW: 17.2 % — ABNORMAL HIGH (ref 11.5–15.5)

## 2011-11-13 LAB — PHOSPHORUS: Phosphorus: 5.3 mg/dL — ABNORMAL HIGH (ref 2.3–4.6)

## 2011-11-13 SURGERY — INSERTION OF DIALYSIS CATHETER
Anesthesia: Monitor Anesthesia Care | Wound class: Clean

## 2011-11-13 MED ORDER — LIDOCAINE HCL (PF) 1 % IJ SOLN
INTRAMUSCULAR | Status: DC | PRN
  Administered 2011-11-13: 30 mL

## 2011-11-13 MED ORDER — SODIUM CHLORIDE 0.9 % IV SOLN
INTRAVENOUS | Status: DC | PRN
  Administered 2011-11-13: 12:00:00 via INTRAVENOUS

## 2011-11-13 MED ORDER — HEPARIN (PORCINE) IN NACL 100-0.45 UNIT/ML-% IJ SOLN
1500.0000 [IU]/h | INTRAMUSCULAR | Status: DC
  Administered 2011-11-13: 1500 [IU]/h via INTRAVENOUS
  Administered 2011-11-14: 1800 [IU]/h via INTRAVENOUS
  Administered 2011-11-15 (×2): 1500 [IU]/h via INTRAVENOUS
  Filled 2011-11-13 (×8): qty 250

## 2011-11-13 MED ORDER — SODIUM CHLORIDE 0.9 % IV SOLN: 0.0150 mg/kg/h | INTRAVENOUS | Status: DC

## 2011-11-13 MED ORDER — METOPROLOL TARTRATE 1 MG/ML IV SOLN
INTRAVENOUS | Status: DC | PRN
  Administered 2011-11-13: 1 mg via INTRAVENOUS

## 2011-11-13 MED ORDER — ACETAMINOPHEN 10 MG/ML IV SOLN: 1000.0000 mg | Freq: Once | INTRAVENOUS | Status: AC | PRN

## 2011-11-13 MED ORDER — LEVALBUTEROL HCL 0.63 MG/3ML IN NEBU
0.6300 mg | INHALATION_SOLUTION | RESPIRATORY_TRACT | Status: DC | PRN
  Administered 2011-11-15: 0.63 mg via RESPIRATORY_TRACT
  Filled 2011-11-13: qty 3

## 2011-11-13 MED ORDER — SODIUM CHLORIDE 0.9 % IR SOLN
Status: DC | PRN
  Administered 2011-11-13: 13:00:00

## 2011-11-13 MED ORDER — LIDOCAINE HCL (PF) 1 % IJ SOLN
INTRAMUSCULAR | Status: AC
  Filled 2011-11-13: qty 30

## 2011-11-13 MED ORDER — DEXTROSE 5 % IV SOLN
2.0000 g | Freq: Once | INTRAVENOUS | Status: DC
  Filled 2011-11-13: qty 2

## 2011-11-13 MED ORDER — ONDANSETRON HCL 4 MG/2ML IJ SOLN: 4.0000 mg | Freq: Once | INTRAMUSCULAR | Status: AC | PRN

## 2011-11-13 MED ORDER — HYDROMORPHONE HCL PF 1 MG/ML IJ SOLN: 0.2500 mg | INTRAMUSCULAR | Status: DC | PRN

## 2011-11-13 MED ORDER — PHENYLEPHRINE HCL 10 MG/ML IJ SOLN
INTRAMUSCULAR | Status: DC | PRN
  Administered 2011-11-13 (×2): 40 ug via INTRAVENOUS

## 2011-11-13 MED ORDER — HEPARIN SODIUM (PORCINE) 1000 UNIT/ML IJ SOLN
INTRAMUSCULAR | Status: AC
  Filled 2011-11-13: qty 1

## 2011-11-13 MED ORDER — HEPARIN SODIUM (PORCINE) 1000 UNIT/ML IJ SOLN
INTRAMUSCULAR | Status: DC | PRN
  Administered 2011-11-13: 10 mL

## 2011-11-13 MED ORDER — PROPOFOL INFUSION 10 MG/ML OPTIME
INTRAVENOUS | Status: DC | PRN
  Administered 2011-11-13: 200 ug/kg/min via INTRAVENOUS

## 2011-11-13 MED ORDER — RENA-VITE PO TABS
1.0000 | ORAL_TABLET | Freq: Every day | ORAL | Status: DC
  Administered 2011-11-13 – 2011-11-16 (×4): 1 via ORAL
  Filled 2011-11-13 (×4): qty 1

## 2011-11-13 SURGICAL SUPPLY — 45 items
BAG DECANTER FOR FLEXI CONT (MISCELLANEOUS) ×2 IMPLANT
CATH CANNON HEMO 15F 50CM (CATHETERS) IMPLANT
CATH CANNON HEMO 15FR 19 (HEMODIALYSIS SUPPLIES) IMPLANT
CATH CANNON HEMO 15FR 23CM (HEMODIALYSIS SUPPLIES) ×1 IMPLANT
CATH CANNON HEMO 15FR 31CM (HEMODIALYSIS SUPPLIES) IMPLANT
CATH CANNON HEMO 15FR 32 (HEMODIALYSIS SUPPLIES) IMPLANT
CATH CANNON HEMO 15FR 32CM (HEMODIALYSIS SUPPLIES) IMPLANT
CHLORAPREP W/TINT 26ML (MISCELLANEOUS) ×2 IMPLANT
CLOTH BEACON ORANGE TIMEOUT ST (SAFETY) ×2 IMPLANT
COVER PROBE W GEL 5X96 (DRAPES) ×1 IMPLANT
COVER SURGICAL LIGHT HANDLE (MISCELLANEOUS) ×2 IMPLANT
DECANTER SPIKE VIAL GLASS SM (MISCELLANEOUS) ×1 IMPLANT
DRAPE C-ARM 42X72 X-RAY (DRAPES) ×2 IMPLANT
DRAPE CHEST BREAST 15X10 FENES (DRAPES) ×2 IMPLANT
GAUZE SPONGE 2X2 8PLY STRL LF (GAUZE/BANDAGES/DRESSINGS) ×1 IMPLANT
GAUZE SPONGE 4X4 16PLY XRAY LF (GAUZE/BANDAGES/DRESSINGS) ×2 IMPLANT
GLOVE BIO SURGEON STRL SZ7.5 (GLOVE) ×2 IMPLANT
GLOVE BIOGEL PI IND STRL 6.5 (GLOVE) IMPLANT
GLOVE BIOGEL PI IND STRL 7.5 (GLOVE) IMPLANT
GLOVE BIOGEL PI INDICATOR 6.5 (GLOVE) ×2
GLOVE BIOGEL PI INDICATOR 7.5 (GLOVE) ×1
GLOVE SURG SS PI 7.5 STRL IVOR (GLOVE) ×1 IMPLANT
GOWN PREVENTION PLUS XLARGE (GOWN DISPOSABLE) ×3 IMPLANT
GOWN STRL NON-REIN LRG LVL3 (GOWN DISPOSABLE) ×3 IMPLANT
KIT BASIN OR (CUSTOM PROCEDURE TRAY) ×2 IMPLANT
KIT ROOM TURNOVER OR (KITS) ×2 IMPLANT
NDL 18GX1X1/2 (RX/OR ONLY) (NEEDLE) ×1 IMPLANT
NDL HYPO 25GX1X1/2 BEV (NEEDLE) ×1 IMPLANT
NEEDLE 18GX1X1/2 (RX/OR ONLY) (NEEDLE) ×2 IMPLANT
NEEDLE HYPO 25GX1X1/2 BEV (NEEDLE) ×2 IMPLANT
NS IRRIG 1000ML POUR BTL (IV SOLUTION) ×1 IMPLANT
PACK SURGICAL SETUP 50X90 (CUSTOM PROCEDURE TRAY) ×2 IMPLANT
PAD ARMBOARD 7.5X6 YLW CONV (MISCELLANEOUS) ×4 IMPLANT
SPONGE GAUZE 2X2 STER 10/PKG (GAUZE/BANDAGES/DRESSINGS) ×1
SUT ETHILON 3 0 PS 1 (SUTURE) ×2 IMPLANT
SUT VICRYL 4-0 PS2 18IN ABS (SUTURE) ×2 IMPLANT
SYR 20CC LL (SYRINGE) ×4 IMPLANT
SYR 30ML LL (SYRINGE) IMPLANT
SYR 5ML LL (SYRINGE) ×4 IMPLANT
SYR CONTROL 10ML LL (SYRINGE) ×2 IMPLANT
SYRINGE 10CC LL (SYRINGE) ×2 IMPLANT
TAPE CLOTH SURG 4X10 WHT LF (GAUZE/BANDAGES/DRESSINGS) ×1 IMPLANT
TOWEL OR 17X24 6PK STRL BLUE (TOWEL DISPOSABLE) ×2 IMPLANT
TOWEL OR 17X26 10 PK STRL BLUE (TOWEL DISPOSABLE) ×2 IMPLANT
WATER STERILE IRR 1000ML POUR (IV SOLUTION) ×2 IMPLANT

## 2011-11-13 NOTE — Op Note (Signed)
Procedure: Ultrasound-guided insertion of Diatek catheter  Preoperative diagnosis: Renal failure  Postoperative diagnosis: Same  Anesthesia: Local with IV sedation  Operative findings: 23 cm Diatek catheter right internal jugular vein  Operative details: After obtaining informed consent, the patient was taken to the operating room. The patient was placed in supine position on the operating room table. After adequate sedation the patient's entire neck and chest were prepped and draped in usual sterile fashion. The patient was placed in Trendelenburg position. Ultrasound was used to identify the patient's right internal jugular vein. This had normal compressibility and respiratory variation. Local anesthesia was infiltrated over the right jugular vein.  Using ultrasound guidance, the right internal jugular vein was successfully cannulated.  A 0.035 J-tipped guidewire was threaded into the right internal jugular vein and into the superior vena cava followed by the inferior vena cava under fluoroscopic guidance.   Next sequential 12 and 14 dilators were placed over the guidewire into the right atrium.  A 16 French dilator with a peel-away sheath was then placed over the guidewire into the right atrium.   The guidewire and dilator were removed. A 23 cm Diatek catheter was then placed through the peel away sheath into the right atrium.  The catheter was then tunneled subcutaneously, cut to length, and the hub attached. The catheter was noted to flush and draw easily. The catheter was inspected under fluoroscopy and found with its tip to be in the right atrium without any kinks throughout its course. The catheter was sutured to the skin with nylon sutures. The neck insertion site was closed with Vicryl stitch. The catheter was then loaded with concentrated Heparin solution. A dry sterile dressing was applied.  The patient tolerated procedure well and there were no complications. Instrument sponge and needle  counts correct in the case. The patient was taken to the recovery room in stable condition. Chest x-ray will be obtained in the recovery room.  Fabienne Bruns, MD [facility] Office: [phone] Pager: [phone]

## 2011-11-13 NOTE — Anesthesia Postprocedure Evaluation (Signed)
  Anesthesia Post-op Note  Patient: Gabriel Tran  Procedure(s) Performed: Procedure(s) (LRB) with comments: INSERTION OF DIALYSIS CATHETER (N/A) - Ultrasound guided.  Patient Location: PACU  Anesthesia Type: General  Level of Consciousness: awake, alert  and oriented  Airway and Oxygen Therapy: Patient Spontanous Breathing and Patient connected to nasal cannula oxygen  Post-op Pain: none  Post-op Assessment: Post-op Vital signs reviewed and Patient's Cardiovascular Status Stable  Post-op Vital Signs: stable  Complications: No apparent anesthesia complications

## 2011-11-13 NOTE — Progress Notes (Signed)
Patient ID: Gabriel Tran, male   DOB: 16-Sep-1955, 56 y.o.   MRN: [id number]   SICU evening rounds:  Hemodynamically stable  Had perma cath inserted today. Plan HD in am.

## 2011-11-13 NOTE — Anesthesia Preprocedure Evaluation (Signed)
Anesthesia Evaluation  Patient identified by MRN, date of birth, ID band Patient awake    Reviewed: Allergy & Precautions, H&P , NPO status , Patient's Chart, lab work & pertinent test results  Airway Mallampati: II TM Distance: >3 FB Neck ROM: Full    Dental  (+) Teeth Intact   Pulmonary  breath sounds clear to auscultation        Cardiovascular Rhythm:Regular Rate:Normal     Neuro/Psych    GI/Hepatic   Endo/Other    Renal/GU      Musculoskeletal   Abdominal   Peds  Hematology   Anesthesia Other Findings   Reproductive/Obstetrics                           Anesthesia Physical Anesthesia Plan  ASA: III  Anesthesia Plan: MAC   Post-op Pain Management:    Induction: Intravenous  Airway Management Planned: Natural Airway and Mask  Additional Equipment:   Intra-op Plan:   Post-operative Plan:   Informed Consent: I have reviewed the patients History and Physical, chart, labs and discussed the procedure including the risks, benefits and alternatives for the proposed anesthesia with the patient or authorized representative who has indicated his/her understanding and acceptance.   Dental advisory given  Plan Discussed with: CRNA and Surgeon  Anesthesia Plan Comments: (S/P Repair Type A aortic dissection 10/30/11 Postop pericardial tamponade Renal Failure treated with CVVH K-4.9 Htn Post-op Afib now in SR  Plan MAC  Kipp Brood, MD)        Anesthesia Quick Evaluation

## 2011-11-13 NOTE — Transfer of Care (Signed)
Immediate Anesthesia Transfer of Care Note  Patient: Gabriel Tran  Procedure(s) Performed: Procedure(s) (LRB) with comments: INSERTION OF DIALYSIS CATHETER (N/A) - Ultrasound guided.  Patient Location: PACU  Anesthesia Type: MAC  Level of Consciousness: awake, sedated and patient cooperative  Airway & Oxygen Therapy: Patient Spontanous Breathing and Patient connected to face mask oxygen  Post-op Assessment: Report given to PACU RN, Post -op Vital signs reviewed and stable and Patient moving all extremities  Post vital signs: Reviewed and stable  Complications: No apparent anesthesia complications

## 2011-11-13 NOTE — Progress Notes (Signed)
S:Awake and alert. Sitting up in chair.  Fem HD cath removed O:BP 136/70  Pulse 101  Temp 98.4 F (36.9 C) (Oral)  Resp 15  Ht 5\' 8"  (1.727 m)  Wt 100.4 kg (221 lb 5.5 oz)  BMI 33.65 kg/m2  SpO2 94%  Intake/Output Summary (Last 24 hours) at 11/13/11 0805 Last data filed at 11/13/11 0600  Gross per 24 hour  Intake   2650 ml  Output   2540 ml  Net    110 ml   Weight change: -2.1 kg (-4 lb 10.1 oz) Gen: Awake and alert CVS:RRR Resp:sl decreased BS rt base with few crackles Abd:+ BS NTND Ext:no edema NEURO: CNI Ox3 2 chest tubes still in place      . amiodarone  200 mg Oral BID  . cefTAZidime (FORTAZ)  IV  2 g Intravenous Q12H  . diltiazem  90 mg Oral Q12H  . docusate sodium  200 mg Oral Daily  . feeding supplement (NEPRO CARB STEADY)  237 mL Oral TID BM  . feeding supplement  30 mL Oral BID WC  . insulin aspart  0-24 Units Subcutaneous Q4H  . levalbuterol  0.63 mg Nebulization TID  . pantoprazole  40 mg Oral Q1200  . sodium chloride  10-40 mL Intracatheter Q12H  . warfarin  2 mg Oral Daily  . Warfarin - Physician Dosing Inpatient   Does not apply q1800  . DISCONTD: cloNIDine  0.1 mg Oral Q4H  . DISCONTD: levalbuterol  0.63 mg Nebulization QID   Dg Chest Port 1 View  11/12/2011  *RADIOLOGY REPORT*  Clinical Data: Status post open heart surgery.  PORTABLE CHEST - 1 VIEW  Comparison: One-view chest 11/11/2011.  Findings: The heart is enlarged, exaggerated by low lung volumes. A left subclavian line and bilateral chest tubes are stable.  New left pleural effusion is present.  There is some lateral airspace disease on the left as well.  The right hemidiaphragm remains elevated.  No other focal airspace disease is evident.  IMPRESSION:  1.  New left lateral opacity most compatible with a pleural effusion. 2.  Adjacent left-sided airspace disease likely reflects atelectasis. 3.  Stable cardiac enlargement.   Original Report Authenticated By: Jamesetta Orleans. MATTERN, M.D.     BMET    Component Value Date/Time   NA 130* 11/13/2011 0456   K 4.9 11/13/2011 0456   CL 95* 11/13/2011 0456   CO2 22 11/13/2011 0456   GLUCOSE 99 11/13/2011 0456   BUN 42* 11/13/2011 0456   CREATININE 4.28* 11/13/2011 0456   CALCIUM 9.0 11/13/2011 0456   GFRNONAA 14* 11/13/2011 0456   GFRAA 17* 11/13/2011 0456   CBC    Component Value Date/Time   WBC 19.1* 11/13/2011 0456   RBC 3.08* 11/13/2011 0456   HGB 9.0* 11/13/2011 0456   HCT 26.8* 11/13/2011 0456   PLT 357 11/13/2011 0456   MCV 87.0 11/13/2011 0456   MCH 29.2 11/13/2011 0456   MCHC 33.6 11/13/2011 0456   RDW 17.2* 11/13/2011 0456   LYMPHSABS 1.5 10/29/2011 1601   MONOABS 0.7 10/29/2011 1601   EOSABS 0.1 10/29/2011 1601   BASOSABS 0.0 10/29/2011 1601     Assessment: 1. ARF due to contrast nephropathy and ATN 2. HTN 3. SP Ascending Aortic dissection repair  Plan: 1.Spoke with Dr Darrick Penna about placing perm cath 2.  Plan HD in AM    Gabriel Tran T

## 2011-11-13 NOTE — Progress Notes (Signed)
7 Days Post-Op Procedure(s) (LRB): INSERTION OF DIALYSIS CATHETER (Right) MEDIASTINAL EXPLORATION (N/A) Subjective: Stable except for ATN NSR starting to walk Coumadin started slowly for postop PE, low dose heparin to start after Diatek cath Objective: Vital signs in last 24 hours: Temp:  [97.5 F (36.4 C)-98.7 F (37.1 C)] 98.4 F (36.9 C) (10/08 0736) Pulse Rate:  [79-101] 92  (10/08 0800) Cardiac Rhythm:  [-] Normal sinus rhythm (10/07 2000) Resp:  [15-26] 21  (10/08 0800) BP: (125-159)/(63-89) 125/63 mmHg (10/08 0800) SpO2:  [90 %-99 %] 94 % (10/08 0800) Weight:  [221 lb 5.5 oz (100.4 kg)] 221 lb 5.5 oz (100.4 kg) (10/08 0600)  Hemodynamic parameters for last 24 hours: CVP:  [8 mmHg-19 mmHg] 10 mmHg  Intake/Output from previous day: 10/07 0701 - 10/08 0700 In: 2812.3 [P.O.:2110; I.V.:602.3; IV Piggyback:100] Out: 2928 [Urine:5; Chest Tube:360] Intake/Output this shift: Total I/O In: 41.2 [I.V.:41.2] Out: -   Incisions clean  Lab Results:  [facility] 11/13/11 0456 11/12/11 0445  WBC 19.1* 18.4*  HGB 9.0* 8.9*  HCT 26.8* 27.1*  PLT 357 267   BMET:  Basename 11/13/11 0456 11/12/11 1600  NA 130* 131*  K 4.9 4.8  CL 95* 96  CO2 22 24  GLUCOSE 99 114*  BUN 42* 26*  CREATININE 4.28* 2.69*  CALCIUM 9.0 8.8    PT/INR:  Basename 11/13/11 0456  LABPROT 17.3*  INR 1.46   ABG    Component Value Date/Time   PHART 7.450 11/10/2011 0404   HCO3 25.5* 11/10/2011 0404   TCO2 27 11/10/2011 0404   ACIDBASEDEF 2.0 11/07/2011 0841   O2SAT 93.0 11/10/2011 0404   CBG (last 3)   Basename 11/13/11 0734 11/13/11 0421 11/12/11 2335  GLUCAP 109* 95 106*    Assessment/Plan: S/P Procedure(s) (LRB): INSERTION OF DIALYSIS CATHETER (Right) MEDIASTINAL EXPLORATION (N/A) Cont ICU care   LOS: 15 days    VAN TRIGT III,PETER 11/13/2011

## 2011-11-13 NOTE — Consult Note (Signed)
Physical Medicine and Rehabilitation Consult Reason for Consult: Deconditioning Referring Physician: Dr. Zenaida Niece trigt   HPI: Gabriel Tran is a 56 y.o. right-handed male  ex smoker as well as history of hypertension admitted 10/29/2011 with chest pain radiating to the neck. Noted blood pressure 230/110. EKG revealed right bundle branch block. Chest x-ray showed no acute findings. Cardiac enzymes were negative. Cranial CT scan showed no evidence of acute abnormality. Patient found to have acute type A. ascending aortic dissection with moderate aortic insufficiency. Underwent emergent repair of ascending aortic dissection 11/01/2011 per Dr. Zenaida Niece trigt. Hospital course with increasing shortness of breath ventilation perfusion scan completed showing intermediate probability for pulmonary embolism. He was placed on low-dose heparin infusion without bolus. Patient became more agitated and developed pulseless electrical activity became hypotensive immediately given CPR and was intubated. Patient underwent emergent sternotomy removing sternal wires/cardiac tamponade 11/04/2011. A chest tube currently remains in place. Patient was extubated 11/10/2011. Developed acute renal failure creatinine increased to 3.09 likely ATN from dye exposure during TAA repair and started on CVVHD. Nephrology continues to follow planning permanent dialysis catheter and considering hemodialysis for 11/14/2011 to be initiated. Coumadin has since been initiated for pulmonary emboli and monitoring for any bleeding episodes. Physical therapy evaluation completed 11/13/2011 with recommendations of physical medicine rehabilitation consult to consider inpatient rehabilitation services secondary to deconditioning and prolonged intubation.  Independent prior to admission. Spoke with occupational therapy. Patient had better endurance today than yesterday.  Review of Systems  Cardiovascular: Positive for chest pain and palpitations.    Gastrointestinal: Positive for constipation.  All other systems reviewed and are negative.   Past Medical History  Diagnosis Date  . Hypertension   . Hyperglycemia     a. A1C 5.9 in Sept 2013.  Marland Kitchen RBBB    Past Surgical History  Procedure Date  . Knee surgery     LEFT 1981  . Carpal tunnel release     2004 RIGHT  . Wisdom tooth extraction   . Thoracic aortic aneurysm repair 10/30/2011    Procedure: THORACIC ASCENDING ANEURYSM REPAIR (AAA);  Surgeon: Kerin Perna, MD;  Location: [facility] OR;  Service: Open Heart Surgery;  Laterality: N/A;  Ascending aortic dissection repair, arch reconstruction, aortic valve repair  . Insertion of dialysis catheter 11/06/2011    Procedure: INSERTION OF DIALYSIS CATHETER;  Surgeon: Kerin Perna, MD;  Location: [facility] OR;  Service: Thoracic;  Laterality: Right;  . Mediastinal exploration 11/06/2011    Procedure: MEDIASTINAL EXPLORATION;  Surgeon: Kerin Perna, MD;  Location: [facility] OR;  Service: Thoracic;  Laterality: N/A;  sternal closure; removal of wound vac   Family History  Problem Relation Age of Onset  . Parkinsonism Mother   . Leukemia Father     AML at 60  . Colon polyps Brother   . Kidney disease Brother   . Heart disease Brother     CABG at age 84.  . Prostate cancer Neg Hx   . Colon cancer Neg Hx   . Heart disease Father     Angioplasty in his 2s, CABG age 45.   Social History:  reports that he quit smoking about 21 years ago. His smoking use included Cigarettes. He does not have any smokeless tobacco history on file. He reports that he drinks alcohol. He reports that he does not use illicit drugs. Allergies: No Known Allergies Medications Prior to Admission  Medication Sig Dispense Refill  . amLODipine (NORVASC) 5 MG tablet Take 1 tablet (5  mg total) by mouth daily.  90 tablet  3  . metoprolol (LOPRESSOR) 100 MG tablet Take 1 tablet (100 mg total) by mouth 2 (two) times daily.  180 tablet  3    Home: Home Living Lives With:  Spouse Available Help at Discharge: Family Type of Home: House Home Access: Stairs to enter Secretary/administrator of Steps: 4 Entrance Stairs-Rails: Right Home Layout: One level Home Adaptive Equipment: None  Functional History: Prior Function Able to Take Stairs?: Reciprically Driving: Yes Vocation: Full time employment Functional Status:  Mobility: Bed Mobility Bed Mobility: Supine to Sit;Sit to Supine Supine to Sit: 3: Mod assist;HOB elevated Sitting - Scoot to Edge of Bed: 4: Min assist Sit to Supine: 3: Mod assist;HOB elevated Transfers Transfers: Sit to Stand;Stand to Sit Sit to Stand: 1: +2 Total assist;Without upper extremity assist;From chair/3-in-1 Sit to Stand: Patient Percentage: 60% Stand to Sit: 1: +2 Total assist;Without upper extremity assist;To bed Stand to Sit: Patient Percentage: 70% Ambulation/Gait Ambulation/Gait Assistance: 1: +2 Total assist Ambulation/Gait: Patient Percentage: 60% Ambulation Distance (Feet): 10 Feet Assistive device: 2 person hand held assist Ambulation/Gait Assistance Details: Pt with bil knee hyperextension Gait Pattern: Step-to pattern;Decreased step length - left;Decreased step length - right;Right genu recurvatum;Left genu recurvatum;Trunk flexed    ADL:    Cognition: Cognition Arousal/Alertness: Awake/alert Orientation Level: Oriented X4 Cognition Overall Cognitive Status: Appears within functional limits for tasks assessed/performed Arousal/Alertness: Awake/alert Orientation Level: Appears intact for tasks assessed Behavior During Session: [facility] for tasks performed  Blood pressure 134/82, pulse 89, temperature 98.4 F (36.9 C), temperature source Oral, resp. rate 23, height 5\' 8"  (1.727 m), weight 100.4 kg (221 lb 5.5 oz), SpO2 96.00%. Physical Exam  Vitals reviewed. Constitutional: He is oriented to person, place, and time. He appears well-developed.  HENT:  Head: Normocephalic.  Eyes:       Pupils round and  reactive to light  Neck: Neck supple. No thyromegaly present.  Cardiovascular: Normal rate and regular rhythm.   Pulmonary/Chest: Breath sounds normal. He has no wheezes.  Abdominal: Soft. Bowel sounds are normal. He exhibits no distension.  Neurological: He is alert and oriented to person, place, and time.       Follows three-step commands  Skin:       Chest tube in place.  Psychiatric: He has a normal mood and affect.  Reduced sensation right second third digits Decreased thumb to index opposition right hand, decreased grip strength in right hand. Rated 3 minus/5 biceps 4/5 triceps 4 minus/5 deltoid 3 minus/5 Right lower extremity 4/5 hip flexors knee extensors ankle dorsiflexor plantar flexor left lower extremity 4/5 hip flexors knee extensors ankle dorsiflexor plantar flexors  Results for orders placed during the hospital encounter of 10/29/11 (from the past 24 hour(s))  GLUCOSE, CAPILLARY     Status: Abnormal   Collection Time   11/12/11 11:51 AM      Component Value Range   Glucose-Capillary 116 (*) 70 - 99 mg/dL  GLUCOSE, CAPILLARY     Status: Abnormal   Collection Time   11/12/11  3:42 PM      Component Value Range   Glucose-Capillary 113 (*) 70 - 99 mg/dL  RENAL FUNCTION PANEL     Status: Abnormal   Collection Time   11/12/11  4:00 PM      Component Value Range   Sodium 131 (*) 135 - 145 mEq/L   Potassium 4.8  3.5 - 5.1 mEq/L   Chloride 96  96 - 112 mEq/L  CO2 24  19 - 32 mEq/L   Glucose, Bld 114 (*) 70 - 99 mg/dL   BUN 26 (*) 6 - 23 mg/dL   Creatinine, Ser 1.61 (*) 0.50 - 1.35 mg/dL   Calcium 8.8  8.4 - 09.6 mg/dL   Phosphorus 3.6  2.3 - 4.6 mg/dL   Albumin 2.5 (*) 3.5 - 5.2 g/dL   GFR calc non Af Amer 25 (*) >90 mL/min   GFR calc Af Amer 29 (*) >90 mL/min  GLUCOSE, CAPILLARY     Status: Abnormal   Collection Time   11/12/11  8:23 PM      Component Value Range   Glucose-Capillary 108 (*) 70 - 99 mg/dL  GLUCOSE, CAPILLARY     Status: Abnormal   Collection Time    11/12/11 11:35 PM      Component Value Range   Glucose-Capillary 106 (*) 70 - 99 mg/dL  GLUCOSE, CAPILLARY     Status: Normal   Collection Time   11/13/11  4:21 AM      Component Value Range   Glucose-Capillary 95  70 - 99 mg/dL  CBC     Status: Abnormal   Collection Time   11/13/11  4:56 AM      Component Value Range   WBC 19.1 (*) 4.0 - 10.5 K/uL   RBC 3.08 (*) 4.22 - 5.81 MIL/uL   Hemoglobin 9.0 (*) 13.0 - 17.0 g/dL   HCT 04.5 (*) 40.9 - 81.1 %   MCV 87.0  78.0 - 100.0 fL   MCH 29.2  26.0 - 34.0 pg   MCHC 33.6  30.0 - 36.0 g/dL   RDW 91.4 (*) 78.2 - 95.6 %   Platelets 357  150 - 400 K/uL  PROTIME-INR     Status: Abnormal   Collection Time   11/13/11  4:56 AM      Component Value Range   Prothrombin Time 17.3 (*) 11.6 - 15.2 seconds   INR 1.46  0.00 - 1.49  APTT     Status: Abnormal   Collection Time   11/13/11  4:56 AM      Component Value Range   aPTT 59 (*) 24 - 37 seconds  COMPREHENSIVE METABOLIC PANEL     Status: Abnormal   Collection Time   11/13/11  4:56 AM      Component Value Range   Sodium 130 (*) 135 - 145 mEq/L   Potassium 4.9  3.5 - 5.1 mEq/L   Chloride 95 (*) 96 - 112 mEq/L   CO2 22  19 - 32 mEq/L   Glucose, Bld 99  70 - 99 mg/dL   BUN 42 (*) 6 - 23 mg/dL   Creatinine, Ser 2.13 (*) 0.50 - 1.35 mg/dL   Calcium 9.0  8.4 - 08.6 mg/dL   Total Protein 6.6  6.0 - 8.3 g/dL   Albumin 2.4 (*) 3.5 - 5.2 g/dL   AST 57 (*) 0 - 37 U/L   ALT 221 (*) 0 - 53 U/L   Alkaline Phosphatase 175 (*) 39 - 117 U/L   Total Bilirubin 1.5 (*) 0.3 - 1.2 mg/dL   GFR calc non Af Amer 14 (*) >90 mL/min   GFR calc Af Amer 17 (*) >90 mL/min  PHOSPHORUS     Status: Abnormal   Collection Time   11/13/11  4:56 AM      Component Value Range   Phosphorus 5.3 (*) 2.3 - 4.6 mg/dL  GLUCOSE, CAPILLARY  Status: Abnormal   Collection Time   11/13/11  7:34 AM      Component Value Range   Glucose-Capillary 109 (*) 70 - 99 mg/dL   Comment 1 Notify RN     Comment 2 Documented in Chart      Dg Chest Port 1 View  11/13/2011  *RADIOLOGY REPORT*  Clinical Data: Status post repair for type A aortic dissection.  PORTABLE CHEST - 1 VIEW  Comparison: Chest x-ray 11/12/2011.  Findings:  There is a left-sided subclavian central venous catheter with tip terminating in the mid superior vena cava.  Bilateral chest tubes are similarly positioned with tip and sideport projecting over the upper thorax bilaterally.  No appreciable pneumothorax is identified on today's examination.  Lung volumes are low.  The there are resolving linear opacities in the left mid and lower lung, likely represent resolving areas of atelectasis. No definite acute consolidative airspace disease.  Pleural thickening or fluid overlying the lateral aspect of the left hemithorax has significantly decreased and is nearly completely resolved.  Mild pulmonary venous congestion without frank pulmonary edema.  Moderate enlargement of the cardiopericardial silhouette is similar to prior examination 11/12/2011.  Median sternotomy wires and midline skin staples are noted.  Skin staples are also seen projecting over the right axillary region.  IMPRESSION: 1.  Support apparatus, as above. 2.  Improving aeration in the left mid and lower lung compatible with resolving areas of subsegmental atelectasis.  There is also decreasing left-sided pleural fluid and/or thickening. 3.  Persistent moderate enlargement of the cardiopericardial silhouette may reflect underlying cardiomegaly and/or the presence of a pericardial effusion.   Original Report Authenticated By: Florencia Reasons, M.D.    Dg Chest Port 1 View  11/12/2011  *RADIOLOGY REPORT*  Clinical Data: Status post open heart surgery.  PORTABLE CHEST - 1 VIEW  Comparison: One-view chest 11/11/2011.  Findings: The heart is enlarged, exaggerated by low lung volumes. A left subclavian line and bilateral chest tubes are stable.  New left pleural effusion is present.  There is some lateral airspace  disease on the left as well.  The right hemidiaphragm remains elevated.  No other focal airspace disease is evident.  IMPRESSION:  1.  New left lateral opacity most compatible with a pleural effusion. 2.  Adjacent left-sided airspace disease likely reflects atelectasis. 3.  Stable cardiac enlargement.   Original Report Authenticated By: Jamesetta Orleans. MATTERN, M.D.     Assessment/Plan: Diagnosis: Deconditioning following aortic aneurysm repair complicated by right brachial plexopathy 1. Does the need for close, 24 hr/day medical supervision in concert with the patient's rehab needs make it unreasonable for this patient to be served in a less intensive setting? Yes 2. 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 Due to bladder management, bowel management, safety, skin/wound care, disease management, medication administration, pain management and patient education, does the patient require 24 hr/day rehab nursing? Yes 4. Does the patient require coordinated care of a physician, rehab nurse, PT (1-2 hrs/day, 5 days/week) and OT (1-2 hrs/day, 5 days/week) to address physical and functional deficits in the context of the above medical diagnosis(es)? Yes Addressing deficits in the following areas: balance, endurance, locomotion, strength, transferring, bathing, dressing and toileting 5. Can the patient actively participate in an intensive therapy program of at least 3 hrs of therapy per day at least 5 days per week? Yes 6. The potential for patient to make measurable gains while on inpatient rehab is  good 7. Anticipated functional outcomes upon discharge from inpatient rehab are Supervision mobility with PT, Supervision to min assist ADLs with OT, Not applicable with SLP. 8. Estimated rehab length of stay to reach the above functional goals is: 7-10 d 9. Does the patient have adequate social supports to  accommodate these discharge functional goals? Yes 10. Anticipated D/C setting: Home 11. Anticipated post D/C treatments: HH therapy 12. Overall Rehab/Functional Prognosis: excellent  RECOMMENDATIONS: This patient's condition is appropriate for continued rehabilitative care in the following setting: CIR Patient has agreed to participate in recommended program. Yes Note that insurance prior authorization may be required for reimbursement for recommended care.  Comment:    11/13/2011

## 2011-11-13 NOTE — Progress Notes (Signed)
ANTICOAGULATION CONSULT NOTE - Initial Consult  Pharmacy Consult for Heparin  Indication: Pulmonary embolus   No Known Allergies  Patient Measurements: Height: 5\' 8"  (172.7 cm) Weight: 221 lb 5.5 oz (100.4 kg) IBW/kg (Calculated) : 68.4  Heparin Dosing Weight: 89.9  Vital Signs: Temp: 97 F (36.1 C) (10/08 1415) Temp src: Oral (10/08 0736) BP: 138/76 mmHg (10/08 1415) Pulse Rate: 82  (10/08 1415)  Labs:  Basename 11/13/11 0456 11/12/11 1600 11/12/11 0445 11/11/11 0439  HGB 9.0* -- 8.9* --  HCT 26.8* -- 27.1* 25.9*  PLT 357 -- 267 258  APTT 59* -- 55* 55*  LABPROT 17.3* -- 16.4* 16.8*  INR 1.46 -- 1.35 1.40  HEPARINUNFRC -- -- -- --  CREATININE 4.28* 2.69* 2.52* --  CKTOTAL -- -- -- --  CKMB -- -- -- --  TROPONINI -- -- -- --    Estimated Creatinine Clearance: 22.7 ml/min (by C-G formula based on Cr of 4.28).   Medical History: Past Medical History  Diagnosis Date  . Hypertension   . Hyperglycemia     a. A1C 5.9 in Sept 2013.  Marland Kitchen RBBB    Assessment: Gabriel Tran is a 56 year old male with multiple medication conditions found to also have a PE. There was concern for HIT while the patient was being treated with a heparin drip and this was changed to angiomax. Both the HIT platelet antibody and assay are negative. Patient to start back on heparin after HD cathter placement today (10/8). Spoke with Dr. Donata Clay who would like to aim for a lower heparin goal in this patient.   Noted low dose warfarin per md was started 10/7. Patients platelets are wnl at 357 and his cbc is consistent with anemia. No bleeding has been noted thus far. Patient is obese, so pharmacy will use his adjusted heparin dosing weight.   Goal of Therapy:  0.3-0.5 per Dr. Donata Clay  Monitor platelets by anticoagulation protocol: Yes   Plan:  Begin heparin 1500 units/hr (79ml/hr) at 1600 (~2 hours after HD catheter placement)  F/u with an 8 hour heparin level, timed collect Daily Heparin level  and CBC  Thank you,  Brett Fairy, PharmD 11/13/2011 3:13 PM

## 2011-11-13 NOTE — Progress Notes (Signed)
Rehab Admissions Coordinator Note:  Patient was screened by Brock Ra for appropriateness for an Inpatient Acute Rehab Consult.  Note PT recommending Inpatient Rehab Consult, also, OT consult.  Note wife able to provide 24/7 care once he is home.  At this time, we are recommending Inpatient Rehab consult.  Please order this consult so pt can be fully evaluated for appropriateness for CIR.  Will discuss w/ pt's RNCM.  Melanee Spry S 11/13/2011, 10:56 AM  I can be reached at [phone].

## 2011-11-13 NOTE — Evaluation (Signed)
Physical Therapy Evaluation Patient Details Name: Gabriel Tran MRN: [id number] DOB: 05-15-1955 Today's Date: 11/13/2011 Time: 6962-9528 PT Time Calculation (min): 13 min  PT Assessment / Plan / Recommendation Clinical Impression  Pt adm for emergent repair of dissection of thoracic aortic artery. Several days post-op developed tamponade and had emergent sternotomy bedside.  Returned to OR for closure of wound several days later.  Pt with acute renal failure and started on CVVHD.  Pt now off CVVHD but to start HD.  Pt much weaker than on  initial eval 11/01/11 after multiple complications.  Recommend OT consult and Rehab consult.    PT Assessment  Patient needs continued PT services    Follow Up Recommendations  Post acute inpatient    Does the patient have the potential to tolerate intense rehabilitation   Yes, Recommend IP Rehab Screening  Barriers to Discharge        Equipment Recommendations  Rolling walker with 5" wheels    Recommendations for Other Services Rehab consult;OT consult   Frequency Min 3X/week    Precautions / Restrictions Precautions Precautions: Sternal;Fall   Pertinent Vitals/Pain VSS      Mobility  Transfers Sit to Stand: 1: +2 Total assist;Without upper extremity assist;From chair/3-in-1 Sit to Stand: Patient Percentage: 60% Stand to Sit: 1: +2 Total assist;Without upper extremity assist;To bed Stand to Sit: Patient Percentage: 70% Ambulation/Gait Ambulation/Gait Assistance: 1: +2 Total assist Ambulation/Gait: Patient Percentage: 60% Ambulation Distance (Feet): 10 Feet Assistive device: 2 person hand held assist Ambulation/Gait Assistance Details: Pt with bil knee hyperextension Gait Pattern: Step-to pattern;Decreased step length - left;Decreased step length - right;Right genu recurvatum;Left genu recurvatum;Trunk flexed    Shoulder Instructions     Exercises     PT Diagnosis: Difficulty walking;Generalized weakness  PT Problem List:  Decreased strength;Decreased activity tolerance;Decreased balance;Decreased mobility;Decreased knowledge of use of DME;Decreased knowledge of precautions PT Treatment Interventions: DME instruction;Gait training;Functional mobility training;Stair training;Therapeutic activities;Balance training;Therapeutic exercise;Patient/family education   PT Goals Acute Rehab PT Goals PT Goal Formulation: With patient Time For Goal Achievement: 11/27/11 Potential to Achieve Goals: Good Pt will go Supine/Side to Sit: with modified independence PT Goal: Supine/Side to Sit - Progress: Goal set today Pt will go Sit to Supine/Side: with modified independence PT Goal: Sit to Supine/Side - Progress: Goal set today Pt will go Sit to Stand: with supervision PT Goal: Sit to Stand - Progress: Goal set today Pt will go Stand to Sit: with supervision PT Goal: Stand to Sit - Progress: Goal set today Pt will Ambulate: >150 feet;with supervision;with least restrictive assistive device PT Goal: Ambulate - Progress: Goal set today Pt will Go Up / Down Stairs: 3-5 stairs;with min assist;with least restrictive assistive device PT Goal: Up/Down Stairs - Progress: Goal set today  Visit Information  Last PT Received On: 11/13/11 Assistance Needed: +2    Subjective Data  Subjective: Pt stated he remembered me from my initial eval . Patient Stated Goal: Return home.   Prior Functioning  Home Living Lives With: Spouse Available Help at Discharge: Family Type of Home: House Home Access: Stairs to enter Entrance Stairs-Rails: Right Home Layout: One level Home Adaptive Equipment: None Prior Function Level of Independence: Independent Able to Take Stairs?: Reciprically Driving: Yes Vocation: Full time employment Communication Communication: No difficulties    Cognition  Overall Cognitive Status: Appears within functional limits for tasks assessed/performed Arousal/Alertness: Awake/alert Orientation Level:  Appears intact for tasks assessed Behavior During Session: [facility] for tasks performed    Extremity/Trunk  Assessment Right Lower Extremity Assessment RLE ROM/Strength/Tone: Deficits RLE ROM/Strength/Tone Deficits: grossly 3/5 Left Lower Extremity Assessment LLE ROM/Strength/Tone: Deficits LLE ROM/Strength/Tone Deficits: grossly 3/5   Balance Static Standing Balance Static Standing - Balance Support: Bilateral upper extremity supported Static Standing - Level of Assistance: 1: +2 Total assist  End of Session PT - End of Session Activity Tolerance: Patient limited by fatigue Patient left: in bed;with call bell/phone within reach;with nursing in room Nurse Communication: Mobility status  GP     [facility] 11/13/2011, 10:39 AM  Skip Mayer PT [phone]

## 2011-11-13 NOTE — Preoperative (Signed)
Beta Blockers   Reason not to administer Beta Blockers:On BB at home, plan to give metoprolol intraoperatively.

## 2011-11-13 NOTE — Consult Note (Signed)
[facility] Consult Note  CC:  Needs hemodialysis access Requesting [place]  HPI: Pt with recent aortic dissection.  Now with renal failure.  Had temporary femoral catheter removed yesterday.  Is currently on Angiomax.  Past Medical History  Diagnosis Date  . Hypertension   . Hyperglycemia     a. A1C 5.9 in Sept 2013.  Marland Kitchen RBBB     FH:  Non-Contributory  Social HX History  Substance Use Topics  . Smoking status: Former Smoker    Types: Cigarettes    Quit date: 02/05/1990  . Smokeless tobacco: Not on file   Comment: Smoked for 20 years.  . Alcohol Use: 0.0 oz/week     10-12 beer a week on average    Allergies No Known Allergies  Medications Current Facility-Administered Medications  Medication Dose Route Frequency Provider Last Rate Last Dose  . 0.9 %  sodium chloride infusion   Intravenous Continuous Erin Barrett, PA 20 mL/hr at 11/13/11 1148    . amiodarone (PACERONE) tablet 200 mg  200 mg Oral BID Kerin Perna, MD   200 mg at 11/13/11 1610  . cefTAZidime (FORTAZ) 2 g in dextrose 5 % 50 mL IVPB  2 g Intravenous Q12H Kerin Perna, MD   2 g at 11/13/11 0949  . cloNIDine (CATAPRES) tablet 0.1 mg  0.1 mg Oral Q4H PRN Dyke Maes, MD      . diltiazem (CARDIZEM SR) 12 hr capsule 90 mg  90 mg Oral Q12H Kerin Perna, MD   90 mg at 11/13/11 0952  . docusate sodium (COLACE) capsule 200 mg  200 mg Oral Daily Ardelle Balls, PA   200 mg at 11/13/11 0952  . feeding supplement (NEPRO CARB STEADY) liquid 237 mL  237 mL Oral TID BM Ailene Ards, RD   237 mL at 11/12/11 1729  . feeding supplement (PRO-STAT SUGAR FREE 64) liquid 30 mL  30 mL Oral BID WC Ailene Ards, RD      . fentaNYL (SUBLIMAZE) injection 25-50 mcg  25-50 mcg Intravenous Q1H PRN Merwyn Katos, MD   50 mcg at 11/11/11 1129  . insulin aspart (novoLOG) injection 0-24 Units  0-24 Units Subcutaneous Q4H Kerin Perna, MD   2 Units at 11/12/11 0006  .  levalbuterol (XOPENEX) nebulizer solution 0.63 mg  0.63 mg Nebulization Q4H PRN Kerin Perna, MD      . metoprolol (LOPRESSOR) injection 2.5-5 mg  2.5-5 mg Intravenous Q2H PRN Erin Barrett, PA   2.5 mg at 11/10/11 1827  . multivitamin (RENA-VIT) tablet 1 tablet  1 tablet Oral Daily Dyke Maes, MD   1 tablet at 11/13/11 1001  . ondansetron (ZOFRAN) injection 4 mg  4 mg Intravenous Q6H PRN Ardelle Balls, PA   4 mg at 11/09/11 1317  . oxyCODONE (Oxy IR/ROXICODONE) immediate release tablet 5-10 mg  5-10 mg Oral Q3H PRN Delight Ovens, MD   10 mg at 11/12/11 1853  . pantoprazole (PROTONIX) EC tablet 40 mg  40 mg Oral Q1200 Delight Ovens, MD   40 mg at 11/12/11 1158  . phenol (CHLORASEPTIC) mouth spray 1 spray  1 spray Mouth/Throat PRN Delight Ovens, MD   1 spray at 11/10/11 1926  . sodium chloride 0.9 % injection 10-40 mL  10-40 mL Intracatheter Q12H Kerin Perna, MD   30 mL at 11/13/11 0952  . sodium chloride 0.9 % injection 10-40 mL  10-40 mL Intracatheter  PRN Kerin Perna, MD   10 mL at 11/04/11 1000  . temazepam (RESTORIL) capsule 15 mg  15 mg Oral QHS PRN Kerin Perna, MD   15 mg at 11/12/11 2215  . warfarin (COUMADIN) tablet 2 mg  2 mg Oral Daily Kerin Perna, MD   2 mg at 11/13/11 1610  . Warfarin - Physician Dosing Inpatient   Does not apply q1800 Kerin Perna, MD      . DISCONTD: bivalirudin (ANGIOMAX) 0.5 mg/mL in sodium chloride 0.9 % 500 mL infusion  0.015 mg/kg/hr Intravenous Continuous Kerin Perna, MD 3.6 mL/hr at 11/12/11 2055 0.015 mg/kg/hr at 11/12/11 2055  . DISCONTD: bivalirudin (ANGIOMAX) 0.5 mg/mL in sodium chloride 0.9 % 500 mL infusion  0.015 mg/kg/hr Intravenous Continuous Kerin Perna, MD      . DISCONTD: dexmedetomidine (PRECEDEX) 400 mcg / 100 mL infusion  0.4-1.2 mcg/kg/hr Intravenous Titrated Kerin Perna, MD   0.2 mcg/kg/hr at 11/10/11 1000  . DISCONTD: levalbuterol (XOPENEX) nebulizer solution 0.63 mg  0.63 mg  Nebulization TID Kerin Perna, MD   0.63 mg at 11/12/11 2057  . DISCONTD: prismasol BGK 4/2.5 5,000 mL dialysis replacement fluid   CRRT Continuous Jay K. Allena Katz, MD 400 mL/hr at 11/12/11 0737    . DISCONTD: prismasol BGK 4/2.5 5,000 mL dialysis replacement fluid   CRRT Continuous Jay K. Allena Katz, MD 400 mL/hr at 11/11/11 [id number]    . DISCONTD: prismasol BGK 4/2.5 5,000 mL dialysis solution   CRRT Continuous Jay K. Allena Katz, MD 2,000 mL/hr at 11/12/11 1255    . DISCONTD: sodium chloride 0.9 % primer fluid for CRRT   CRRT PRN Vonna Kotyk K. Allena Katz, MD       LAB:  CBC    Component Value Date/Time   WBC 19.1* 11/13/2011 0456   RBC 3.08* 11/13/2011 0456   HGB 9.0* 11/13/2011 0456   HCT 26.8* 11/13/2011 0456   PLT 357 11/13/2011 0456   MCV 87.0 11/13/2011 0456   MCH 29.2 11/13/2011 0456   MCHC 33.6 11/13/2011 0456   RDW 17.2* 11/13/2011 0456   LYMPHSABS 1.5 10/29/2011 1601   MONOABS 0.7 10/29/2011 1601   EOSABS 0.1 10/29/2011 1601   BASOSABS 0.0 10/29/2011 1601      PHYSICAL EXAM  Filed Vitals:   11/13/11 1100  BP: 134/82  Pulse: 89  Temp:   Resp: 23    General:  WDWN in NAD HENT: WNL Eyes: Pupils equal Pulmonary: normal non-labored breathing , without Rales, rhonchi,  wheezing Cardiac: RRR Chest: healing sternotomy Neuro A&O x 3; good sensation; motion in all extremities  Impression: Needs hemodialysis access  Plan: Diatek catheter today Will stop angiomax prior Keep NPO for now  Ennis Heavner E @TODAY @ 12:04 PM

## 2011-11-14 ENCOUNTER — Inpatient Hospital Stay (HOSPITAL_COMMUNITY): Payer: PRIVATE HEALTH INSURANCE

## 2011-11-14 DIAGNOSIS — R5381 Other malaise: Secondary | ICD-10-CM

## 2011-11-14 LAB — CBC
HCT: 23.9 % — ABNORMAL LOW (ref 39.0–52.0)
Hemoglobin: 8.1 g/dL — ABNORMAL LOW (ref 13.0–17.0)
MCH: 28.9 pg (ref 26.0–34.0)
MCH: 28.9 pg (ref 26.0–34.0)
MCHC: 33.6 g/dL (ref 30.0–36.0)
MCHC: 33.9 g/dL (ref 30.0–36.0)
MCV: 85.9 fL (ref 78.0–100.0)
MCV: 85.4 fL (ref 78.0–100.0)
Platelets: 368 10*3/uL (ref 150–400)
Platelets: 351 10*3/uL (ref 150–400)
RBC: 2.91 MIL/uL — ABNORMAL LOW (ref 4.22–5.81)
RBC: 2.8 MIL/uL — ABNORMAL LOW (ref 4.22–5.81)
RDW: 16.6 % — ABNORMAL HIGH (ref 11.5–15.5)
RDW: 16.5 % — ABNORMAL HIGH (ref 11.5–15.5)
WBC: 15.6 10*3/uL — ABNORMAL HIGH (ref 4.0–10.5)

## 2011-11-14 LAB — HEPARIN LEVEL (UNFRACTIONATED)
Heparin Unfractionated: 0.2 IU/mL — ABNORMAL LOW (ref 0.30–0.70)
Heparin Unfractionated: <0.1 IU/mL — ABNORMAL LOW (ref 0.30–0.70)

## 2011-11-14 LAB — RENAL FUNCTION PANEL
Albumin: 2.1 g/dL — ABNORMAL LOW (ref 3.5–5.2)
BUN: 72 mg/dL — ABNORMAL HIGH (ref 6–23)
CO2: 18 mEq/L — ABNORMAL LOW (ref 19–32)
Calcium: 8.8 mg/dL (ref 8.4–10.5)
Chloride: 92 mEq/L — ABNORMAL LOW (ref 96–112)
Creatinine, Ser: 6.77 mg/dL — ABNORMAL HIGH (ref 0.50–1.35)
GFR calc Af Amer: 10 mL/min — ABNORMAL LOW (ref >90)
GFR calc non Af Amer: 8 mL/min — ABNORMAL LOW (ref >90)
Glucose, Bld: 106 mg/dL — ABNORMAL HIGH (ref 70–99)
Phosphorus: 8.4 mg/dL — ABNORMAL HIGH (ref 2.3–4.6)
Potassium: 4.8 mEq/L (ref 3.5–5.1)
Sodium: 126 mEq/L — ABNORMAL LOW (ref 135–145)

## 2011-11-14 LAB — PROTIME-INR
INR: 1.33 (ref 0.00–1.49)
Prothrombin Time: 16.2 seconds — ABNORMAL HIGH (ref 11.6–15.2)

## 2011-11-14 LAB — GLUCOSE, CAPILLARY
Glucose-Capillary: 109 mg/dL — ABNORMAL HIGH (ref 70–99)
Glucose-Capillary: 112 mg/dL — ABNORMAL HIGH (ref 70–99)
Glucose-Capillary: 131 mg/dL — ABNORMAL HIGH (ref 70–99)
Glucose-Capillary: 82 mg/dL (ref 70–99)

## 2011-11-14 MED ORDER — MAGNESIUM HYDROXIDE 400 MG/5ML PO SUSP: 30.0000 mL | Freq: Every day | ORAL | Status: DC | PRN

## 2011-11-14 MED ORDER — DEXTROSE 5 % IV SOLN
2.0000 g | Freq: Once | INTRAVENOUS | Status: AC
  Administered 2011-11-14: 2 g via INTRAVENOUS
  Filled 2011-11-14: qty 2

## 2011-11-14 MED ORDER — WARFARIN SODIUM 4 MG PO TABS
4.0000 mg | ORAL_TABLET | Freq: Every day | ORAL | Status: DC
  Administered 2011-11-15 – 2011-11-16 (×2): 4 mg via ORAL
  Filled 2011-11-14 (×2): qty 1

## 2011-11-14 MED ORDER — CALCIUM ACETATE 667 MG PO CAPS
1334.0000 mg | ORAL_CAPSULE | Freq: Three times a day (TID) | ORAL | Status: DC
  Administered 2011-11-14 – 2011-11-16 (×8): 1334 mg via ORAL
  Filled 2011-11-14 (×10): qty 2

## 2011-11-14 MED ORDER — BISACODYL 5 MG PO TBEC: 10.0000 mg | DELAYED_RELEASE_TABLET | Freq: Every day | ORAL | Status: DC | PRN

## 2011-11-14 NOTE — Progress Notes (Signed)
ANTICOAGULATION CONSULT NOTE - Follow Up Consult  Pharmacy Consult for UFH Indication: pulmonary embolus  MD adjusting heparin drip, pharmacy will sign off.       No Known Allergies  Patient Measurements: Height: 5\' 8"  (172.7 cm) Weight: 221 lb 5.5 oz (100.4 kg) IBW/kg (Calculated) : 68.4   Vital Signs: Temp: 98 F (36.7 C) (10/09 1537) Temp src: Oral (10/09 1537) BP: 134/73 mmHg (10/09 1600) Pulse Rate: 87  (10/09 1445)  Labs:    Verlene Mayer M 11/14/2011,4:07 PM

## 2011-11-14 NOTE — Progress Notes (Signed)
Comfortable Had HD today  BP 149/75  Pulse 92  Temp 98 F (36.7 C) (Oral)  Resp 19  Ht 5\' 8"  (1.727 m)  Wt 221 lb 5.5 oz (100.4 kg)  BMI 33.65 kg/m2  SpO2 96%   Intake/Output Summary (Last 24 hours) at 11/14/11 1837 Last data filed at 11/14/11 1812  Gross per 24 hour  Intake   1471 ml  Output   2554 ml  Net  -1083 ml    Continue present care

## 2011-11-14 NOTE — Progress Notes (Signed)
1 Day Post-Op Procedure(s) (LRB): INSERTION OF DIALYSIS CATHETER (N/A) Subjective: Stable, getting stronger but still needs HD Incisions clean Coumadin slowly loaded for PE receivigf PT   Objective: Vital signs in last 24 hours: Temp:  [97 F (36.1 C)-99 F (37.2 C)] 98 F (36.7 C) (10/09 0752) Pulse Rate:  [68-96] 86  (10/09 0800) Cardiac Rhythm:  [-] Normal sinus rhythm (10/09 0800) Resp:  [16-27] 16  (10/09 0800) BP: (112-158)/(53-95) 123/80 mmHg (10/09 0800) SpO2:  [80 %-100 %] 94 % (10/09 0800)  Hemodynamic parameters for last 24 hours: CVP:  [4 mmHg-13 mmHg] 7 mmHg  Intake/Output from previous day: 10/08 0701 - 10/09 0700 In: 1607.8 [P.O.:777; I.V.:830.8] Out: 325 [Urine:125; Chest Tube:200] Intake/Output this shift: Total I/O In: 73 [P.O.:50; I.V.:23] Out: 20 [Chest Tube:20]  Exam Good pulses neuro intact  Lab Results:  Basename 11/14/11 0442 11/13/11 0456  WBC 14.6* 19.1*  HGB 8.4* 9.0*  HCT 25.0* 26.8*  PLT 368 357   BMET:  Basename 11/14/11 0439 11/13/11 0456  NA 126* 130*  K 4.8 4.9  CL 92* 95*  CO2 18* 22  GLUCOSE 106* 99  BUN 72* 42*  CREATININE 6.77* 4.28*  CALCIUM 8.8 9.0    PT/INR:  Basename 11/13/11 0456  LABPROT 17.3*  INR 1.46   ABG    Component Value Date/Time   PHART 7.450 11/10/2011 0404   HCO3 25.5* 11/10/2011 0404   TCO2 27 11/10/2011 0404   ACIDBASEDEF 2.0 11/07/2011 0841   O2SAT 93.0 11/10/2011 0404   CBG (last 3)   Basename 11/14/11 0755 11/14/11 0349 11/14/11 0016  GLUCAP 109* 131* 129*    Assessment/Plan: S/P Procedure(s) (LRB): INSERTION OF DIALYSIS CATHETER (N/A) Keep in ICU today   LOS: 16 days    VAN TRIGT III,Matylda Fehring 11/14/2011

## 2011-11-14 NOTE — Progress Notes (Signed)
ANTICOAGULATION CONSULT NOTE  Pharmacy Consult for Heparin  Indication: Pulmonary embolus   No Known Allergies  Patient Measurements: Height: 5\' 8"  (172.7 cm) Weight: 221 lb 5.5 oz (100.4 kg) IBW/kg (Calculated) : 68.4  Heparin Dosing Weight: 89.9  Vital Signs: Temp: 98.3 F (36.8 C) (10/08 1952) Temp src: Oral (10/08 1952) BP: 131/79 mmHg (10/09 0000) Pulse Rate: 87  (10/09 0000)  Labs:  Basename 11/14/11 0010 11/13/11 0456 11/12/11 1600 11/12/11 0445 11/11/11 0439  HGB -- 9.0* -- 8.9* --  HCT -- 26.8* -- 27.1* 25.9*  PLT -- 357 -- 267 258  APTT -- 59* -- 55* 55*  LABPROT -- 17.3* -- 16.4* 16.8*  INR -- 1.46 -- 1.35 1.40  HEPARINUNFRC 0.20* -- -- -- --  CREATININE -- 4.28* 2.69* 2.52* --  CKTOTAL -- -- -- -- --  CKMB -- -- -- -- --  TROPONINI -- -- -- -- --    Estimated Creatinine Clearance: 22.7 ml/min (by C-G formula based on Cr of 4.28).  Assessment: 56 year old male s/p repair AAA 9/24, complicated post-op course including PE 9/28, for anticoagulation.    Goal of Therapy:  0.3-0.5 per Dr. Donata Clay  Monitor platelets by anticoagulation protocol: Yes   Plan:  Increase Heparin 1800 units/hr Check heparin level in 8 hours.  Geannie Risen, PharmD, BCPS 11/14/2011 12:42 AM

## 2011-11-14 NOTE — Evaluation (Signed)
Occupational Therapy Evaluation Patient Details Name: Gabriel Tran MRN: [id number] DOB: February 23, 1955 Today's Date: 11/14/2011 Time: 1324-4010 OT Time Calculation (min): 44 min  OT Assessment / Plan / Recommendation Clinical Impression  This 56 y.o. male admitted with CP radiating to neck.  Underwent emergent repair of ascending aortic dissection 11/01/2011 .  Post op course was complicated by probable PE, PEA with CPR, emergent sternotomy due to tamponade.  Pt. extubated 11/10/11.  Pt presents to OT with generalized weakness and decreased activity tolerance, as well as decreased Rt. hand function.  Pt. was very active PTA, and is very motivated and has good family support.  He will benefit from acute OT to maximize safety and independence with BADLs to allow him to return home at supervision level after CIR    OT Assessment  Patient needs continued OT Services    Follow Up Recommendations  Inpatient Rehab    Barriers to Discharge None    Equipment Recommendations  Rolling walker with 5" wheels;3 in 1 bedside comode    Recommendations for Other Services Rehab consult  Frequency  Min 2X/week    Precautions / Restrictions Precautions Precautions: Sternal;Fall Precaution Comments: Pt and wife instructed in sternal precautions.  He requires min verbal cues Restrictions Weight Bearing Restrictions: No       ADL  Eating/Feeding: Simulated;Set up (using Lt. UE) Where Assessed - Eating/Feeding: Bed level Grooming: Simulated;Wash/dry hands;Wash/dry face;Supervision/safety Where Assessed - Grooming: Unsupported sitting Upper Body Bathing: Simulated;Moderate assistance Where Assessed - Upper Body Bathing: Supported sitting Lower Body Bathing: Simulated;+1 Total assistance Where Assessed - Lower Body Bathing: Supported sit to stand Upper Body Dressing: Performed;Maximal assistance Where Assessed - Upper Body Dressing: Unsupported sitting Lower Body Dressing: Simulated;+1 Total  assistance Where Assessed - Lower Body Dressing: Supported sit to stand Toilet Transfer: Simulated;+2 Total assistance Toilet Transfer: Patient Percentage: 70% Statistician Method: Sit to stand;Stand pivot Acupuncturist: Other (comment) (ambulation and transfer to recliner) Toileting - Clothing Manipulation and Hygiene: Simulated;Maximal assistance Where Assessed - Engineer, mining and Hygiene: Standing Equipment Used: Other (comment) (Pt. pushed wheelchair) Transfers/Ambulation Related to ADLs: Pt. ambulated ~40' with total A +2 (pt ~75%).  Pt. fatigues and becomes somewhat unsteady ADL Comments: Pt. fatigues rapidly.  Requires mod - total A for BADLs except grooming and self feeding due to fatigue/endurance issues.  Pt. with weakness Rt. hand and unable to use Rt. hand for self care activities    OT Diagnosis: Generalized weakness;Acute pain  OT Problem List: Decreased strength;Decreased range of motion;Decreased activity tolerance;Impaired balance (sitting and/or standing);Decreased coordination;Decreased knowledge of use of DME or AE;Decreased knowledge of precautions;Cardiopulmonary status limiting activity;Impaired UE functional use;Pain OT Treatment Interventions: Self-care/ADL training;Therapeutic exercise;DME and/or AE instruction;Therapeutic activities;Patient/family education;Balance training   OT Goals Acute Rehab OT Goals OT Goal Formulation: With patient/family Time For Goal Achievement: 11/28/11 Potential to Achieve Goals: Good ADL Goals Pt Will Perform Eating: with set-up;with adaptive utensils;Supported;Sitting, chair (with Rt. hand) ADL Goal: Eating - Progress: Goal set today Pt Will Perform Grooming: with supervision;Standing at sink ADL Goal: Grooming - Progress: Goal set today Pt Will Perform Upper Body Bathing: with set-up;Sitting, chair ADL Goal: Upper Body Bathing - Progress: Goal set today Pt Will Perform Lower Body Bathing: with  supervision;Sit to stand from chair;Sit to stand from bed ADL Goal: Lower Body Bathing - Progress: Goal set today Pt Will Perform Upper Body Dressing: with supervision;Sitting, chair;Sitting, bed ADL Goal: Upper Body Dressing - Progress: Goal set today Pt Will Perform Lower  Body Dressing: with supervision;Sit to stand from chair;Sit to stand from bed ADL Goal: Lower Body Dressing - Progress: Goal set today Pt Will Transfer to Toilet: with supervision;Ambulation;Comfort height toilet ADL Goal: Toilet Transfer - Progress: Goal set today Additional ADL Goal #1: Pt. will demonstrate 75% active finger flexion Rt. hand for increased functional use ADL Goal: Additional Goal #1 - Progress: Goal set today  Visit Information  Last OT Received On: 11/14/11 Assistance Needed: +2    Subjective Data  Subjective: "I'm just weak Patient Stated Goal: To get better and back to normal"   Prior Functioning     Home Living Lives With: Spouse Available Help at Discharge: Family;Available 24 hours/day (mother will stay when wife is unavailable) Type of Home: House Home Access: Stairs to enter [employer] of Steps: 4 Entrance Stairs-Rails: Right Home Layout: One level Bathroom Shower/Tub: Engineer, manufacturing systems: Standard Bathroom Accessibility: Yes How Accessible: Accessible via walker Home Adaptive Equipment: None Prior Function Level of Independence: Independent Able to Take Stairs?: Reciprically Driving: Yes Vocation: Full time employment Communication Communication: No difficulties Dominant Hand: Right         Vision/Perception Perception Perception: Within Functional Limits Praxis Praxis: Intact   Cognition  Overall Cognitive Status: Appears within functional limits for tasks assessed/performed Arousal/Alertness: Awake/alert Orientation Level: Oriented X4 / Intact Behavior During Session: [facility] for tasks performed Cognition - Other Comments: Pt. recalls  information/occurances through the day.  Mildly delayed processing    Extremity/Trunk Assessment Right Upper Extremity Assessment RUE ROM/Strength/Tone: Deficits RUE ROM/Strength/Tone Deficits: Shoulder strength 2/5 - 2-/5; elbow, wrist, forearm grossly 3+/5; Grip 1+/5.  Finger extension AROM WFL RUE Sensation: Deficits RUE Sensation Deficits: Pt. reports numbness Rt. hand RUE Coordination: Deficits RUE Coordination Deficits: Pt. unable to grasp items in Rt. hand Left Upper Extremity Assessment LUE ROM/Strength/Tone: Deficits LUE ROM/Strength/Tone Deficits: Shoulder strength grossly 2/5; elbow distally grossly 4-/5.  LUE Sensation: WFL - Light Touch LUE Coordination: Deficits LUE Coordination Deficits: gross motor deficits due to weakness     Mobility Bed Mobility Bed Mobility: Supine to Sit;Sitting - Scoot to Edge of Bed;Sit to Supine Supine to Sit: 3: Mod assist;HOB elevated Sitting - Scoot to Edge of Bed: 4: Min assist Sit to Supine: 3: Mod assist;HOB elevated Details for Bed Mobility Assistance: required min verbal cues for sternal precautions.  Assist to lift shoulder from bed and to lower trunk to bed and raise feet onto bed Transfers Transfers: Sit to Stand;Stand to Sit Sit to Stand: 1: +2 Total assist;Without upper extremity assist;From chair/3-in-1;From bed Sit to Stand: Patient Percentage: 70% Stand to Sit: 1: +2 Total assist;Without upper extremity assist;To bed;To chair/3-in-1 Stand to Sit: Patient Percentage: 80% Details for Transfer Assistance: Verbal cues for technique     Shoulder Instructions     Exercise     Balance     End of Session OT - End of Session Activity Tolerance: Patient limited by fatigue Patient left: in bed;with call bell/phone within reach;with nursing in room Nurse Communication: Patient requests pain meds;Other (comment) (Bleeding from dressing on chest)  GO     Teesha Ohm M 11/14/2011, 12:49 PM

## 2011-11-14 NOTE — Procedures (Signed)
Pt seen on HD.  Ap 60 Vp 240 at BFR of 200-250.  High venous pressures preventing higher blood flow.  Pt tolerating procedure well. Hemodynamically stable.

## 2011-11-14 NOTE — Progress Notes (Signed)
S:Awake and alert. Sitting up in chair. Eating well.  Rt perm cath placed yest O:BP 132/95  Pulse 90  Temp 99 F (37.2 C) (Oral)  Resp 23  Ht 5\' 8"  (1.727 m)  Wt 100.4 kg (221 lb 5.5 oz)  BMI 33.65 kg/m2  SpO2 94%  Intake/Output Summary (Last 24 hours) at 11/14/11 0754 Last data filed at 11/14/11 0700  Gross per 24 hour  Intake 1607.8 ml  Output    325 ml  Net 1282.8 ml   Weight change:  Gen: Awake and alert CVS:RRR Resp:clear Abd:+ BS NTND Ext:no edema NEURO: CNI Ox3 1 chest tubes still in place RT IJ perm cath      . amiodarone  200 mg Oral BID  . cefTAZidime (FORTAZ)  IV  2 g Intravenous Once  . diltiazem  90 mg Oral Q12H  . docusate sodium  200 mg Oral Daily  . feeding supplement (NEPRO CARB STEADY)  237 mL Oral TID BM  . feeding supplement  30 mL Oral BID WC  . insulin aspart  0-24 Units Subcutaneous Q4H  . multivitamin  1 tablet Oral Daily  . pantoprazole  40 mg Oral Q1200  . sodium chloride  10-40 mL Intracatheter Q12H  . warfarin  2 mg Oral Daily  . Warfarin - Physician Dosing Inpatient   Does not apply q1800  . DISCONTD: cefTAZidime (FORTAZ)  IV  2 g Intravenous Q12H  . DISCONTD: levalbuterol  0.63 mg Nebulization TID   Dg Chest Port 1 View  11/13/2011  *RADIOLOGY REPORT*  Clinical Data: Diatek catheter placement  PORTABLE CHEST - 1 VIEW  Comparison: 11/13/2011  Findings: Right Diatek catheter tip in the SVC.  No pneumothorax. Left subclavian central venous catheter tip in the SVC, unchanged.  Left chest tube has been removed since the prior study.  No pneumothorax.  Right chest tube remains in place without pneumothorax.  Negative for heart failure.  Mild bibasilar atelectasis.  IMPRESSION: Satisfactory Diatek catheter placement without pneumothorax.   Left chest tube removal without pneumothorax.   Original Report Authenticated By: Camelia Phenes, M.D.    Dg Chest Port 1 View  11/13/2011  *RADIOLOGY REPORT*  Clinical Data: Status post repair for type A  aortic dissection.  PORTABLE CHEST - 1 VIEW  Comparison: Chest x-ray 11/12/2011.  Findings:  There is a left-sided subclavian central venous catheter with tip terminating in the mid superior vena cava.  Bilateral chest tubes are similarly positioned with tip and sideport projecting over the upper thorax bilaterally.  No appreciable pneumothorax is identified on today's examination.  Lung volumes are low.  The there are resolving linear opacities in the left mid and lower lung, likely represent resolving areas of atelectasis. No definite acute consolidative airspace disease.  Pleural thickening or fluid overlying the lateral aspect of the left hemithorax has significantly decreased and is nearly completely resolved.  Mild pulmonary venous congestion without frank pulmonary edema.  Moderate enlargement of the cardiopericardial silhouette is similar to prior examination 11/12/2011.  Median sternotomy wires and midline skin staples are noted.  Skin staples are also seen projecting over the right axillary region.  IMPRESSION: 1.  Support apparatus, as above. 2.  Improving aeration in the left mid and lower lung compatible with resolving areas of subsegmental atelectasis.  There is also decreasing left-sided pleural fluid and/or thickening. 3.  Persistent moderate enlargement of the cardiopericardial silhouette may reflect underlying cardiomegaly and/or the presence of a pericardial effusion.   Original Report Authenticated  By: Florencia Reasons, M.D.    Dg Fluoro Guide Cv Line-no Report  11/13/2011  CLINICAL DATA: diatek insertion   FLOURO GUIDE CV LINE  Fluoroscopy was utilized by the requesting physician.  No radiographic  interpretation.     BMET    Component Value Date/Time   NA 126* 11/14/2011 0439   K 4.8 11/14/2011 0439   CL 92* 11/14/2011 0439   CO2 18* 11/14/2011 0439   GLUCOSE 106* 11/14/2011 0439   BUN 72* 11/14/2011 0439   CREATININE 6.77* 11/14/2011 0439   CALCIUM 8.8 11/14/2011 0439   GFRNONAA 8*  11/14/2011 0439   GFRAA 10* 11/14/2011 0439   CBC    Component Value Date/Time   WBC 14.6* 11/14/2011 0442   RBC 2.91* 11/14/2011 0442   HGB 8.4* 11/14/2011 0442   HCT 25.0* 11/14/2011 0442   PLT 368 11/14/2011 0442   MCV 85.9 11/14/2011 0442   MCH 28.9 11/14/2011 0442   MCHC 33.6 11/14/2011 0442   RDW 16.6* 11/14/2011 0442   LYMPHSABS 1.5 10/29/2011 1601   MONOABS 0.7 10/29/2011 1601   EOSABS 0.1 10/29/2011 1601   BASOSABS 0.0 10/29/2011 1601     Assessment: 1. ARF due to contrast nephropathy and ATN 2. HTN 3. SP Ascending Aortic dissection repair  Plan: 1. HD today 2. Start  PO4 binder  Gabriel Tran

## 2011-11-14 NOTE — Progress Notes (Addendum)
I met with patient at bedside and then called his wife by phone. Patient would benefit from an inpt rehab stay prior to d/c home. I will begin authorization with his [employer]. Please clarify when patient felt medically ready to d/c to inpt rehab so I can be begin arrangements. I will follow up tomorrow. [phone] I have discussed with Dr. Briant Cedar.

## 2011-11-15 ENCOUNTER — Inpatient Hospital Stay (HOSPITAL_COMMUNITY): Payer: PRIVATE HEALTH INSURANCE

## 2011-11-15 LAB — COMPREHENSIVE METABOLIC PANEL
ALT: 111 U/L — ABNORMAL HIGH (ref 0–53)
AST: 35 U/L (ref 0–37)
Albumin: 2.1 g/dL — ABNORMAL LOW (ref 3.5–5.2)
Alkaline Phosphatase: 143 U/L — ABNORMAL HIGH (ref 39–117)
BUN: 45 mg/dL — ABNORMAL HIGH (ref 6–23)
CO2: 23 mEq/L (ref 19–32)
Calcium: 8.7 mg/dL (ref 8.4–10.5)
Chloride: 96 mEq/L (ref 96–112)
Creatinine, Ser: 5.22 mg/dL — ABNORMAL HIGH (ref 0.50–1.35)
GFR calc Af Amer: 13 mL/min — ABNORMAL LOW (ref >90)
GFR calc non Af Amer: 11 mL/min — ABNORMAL LOW (ref >90)
Glucose, Bld: 150 mg/dL — ABNORMAL HIGH (ref 70–99)
Potassium: 4.7 mEq/L (ref 3.5–5.1)
Sodium: 130 mEq/L — ABNORMAL LOW (ref 135–145)
Total Bilirubin: 1 mg/dL (ref 0.3–1.2)
Total Protein: 6 g/dL (ref 6.0–8.3)

## 2011-11-15 LAB — GLUCOSE, CAPILLARY
Glucose-Capillary: 119 mg/dL — ABNORMAL HIGH (ref 70–99)
Glucose-Capillary: 100 mg/dL — ABNORMAL HIGH (ref 70–99)
Glucose-Capillary: 126 mg/dL — ABNORMAL HIGH (ref 70–99)

## 2011-11-15 LAB — CBC
HCT: 23.2 % — ABNORMAL LOW (ref 39.0–52.0)
Hemoglobin: 7.8 g/dL — ABNORMAL LOW (ref 13.0–17.0)
MCH: 28.6 pg (ref 26.0–34.0)
MCHC: 33.6 g/dL (ref 30.0–36.0)
MCV: 85 fL (ref 78.0–100.0)
Platelets: 327 10*3/uL (ref 150–400)
RBC: 2.73 MIL/uL — ABNORMAL LOW (ref 4.22–5.81)
RDW: 16.3 % — ABNORMAL HIGH (ref 11.5–15.5)
WBC: 12.8 10*3/uL — ABNORMAL HIGH (ref 4.0–10.5)

## 2011-11-15 LAB — PHOSPHORUS: Phosphorus: 5.9 mg/dL — ABNORMAL HIGH (ref 2.3–4.6)

## 2011-11-15 LAB — HEPARIN LEVEL (UNFRACTIONATED): Heparin Unfractionated: <0.1 IU/mL — ABNORMAL LOW (ref 0.30–0.70)

## 2011-11-15 LAB — PROTIME-INR
INR: 1.37 (ref 0.00–1.49)
Prothrombin Time: 16.5 seconds — ABNORMAL HIGH (ref 11.6–15.2)

## 2011-11-15 MED ORDER — SODIUM CHLORIDE 0.9 % IJ SOLN: 3.0000 mL | INTRAMUSCULAR | Status: DC | PRN

## 2011-11-15 MED ORDER — INSULIN ASPART 100 UNIT/ML ~~LOC~~ SOLN: 0.0000 [IU] | SUBCUTANEOUS | Status: DC

## 2011-11-15 MED ORDER — MAGNESIUM HYDROXIDE 400 MG/5ML PO SUSP: 30.0000 mL | Freq: Every day | ORAL | Status: DC | PRN

## 2011-11-15 MED ORDER — MOVING RIGHT ALONG BOOK
Freq: Once | Status: AC
  Administered 2011-11-15: 16:00:00
  Filled 2011-11-15: qty 1

## 2011-11-15 MED ORDER — BISACODYL 5 MG PO TBEC: 10.0000 mg | DELAYED_RELEASE_TABLET | Freq: Every day | ORAL | Status: DC | PRN

## 2011-11-15 MED ORDER — DOCUSATE SODIUM 100 MG PO CAPS
200.0000 mg | ORAL_CAPSULE | Freq: Every day | ORAL | Status: DC
  Administered 2011-11-15 – 2011-11-16 (×2): 200 mg via ORAL
  Filled 2011-11-15 (×2): qty 2

## 2011-11-15 MED ORDER — INSULIN ASPART 100 UNIT/ML ~~LOC~~ SOLN
0.0000 [IU] | Freq: Three times a day (TID) | SUBCUTANEOUS | Status: DC
  Administered 2011-11-15: 2 [IU] via SUBCUTANEOUS
  Administered 2011-11-15: 1 [IU] via SUBCUTANEOUS

## 2011-11-15 MED ORDER — SODIUM CHLORIDE 0.9 % IJ SOLN: 3.0000 mL | Freq: Two times a day (BID) | INTRAMUSCULAR | Status: DC

## 2011-11-15 MED ORDER — BISACODYL 10 MG RE SUPP: 10.0000 mg | Freq: Every day | RECTAL | Status: DC | PRN

## 2011-11-15 MED ORDER — SODIUM CHLORIDE 0.9 % IV SOLN: 250.0000 mL | INTRAVENOUS | Status: DC | PRN

## 2011-11-15 NOTE — Progress Notes (Addendum)
T[facility] DAILY PROGRESS NOTE                   [address]            [place] [address]          [phone]      2 Days Post-Op Procedure(s) (LRB): INSERTION OF DIALYSIS CATHETER (N/A)  Total Length of Stay:  LOS: 17 days   Subjective: Feeling better, stronger, right arm function limited  Objective: Vital signs in last 24 hours: Temp:  [98 F (36.7 C)-99.4 F (37.4 C)] 99.4 F (37.4 C) (10/10 0000) Pulse Rate:  [78-104] 88  (10/10 0700) Cardiac Rhythm:  [-] Normal sinus rhythm (10/10 0600) Resp:  [15-33] 21  (10/10 0700) BP: (105-163)/(65-106) 140/76 mmHg (10/10 0700) SpO2:  [90 %-100 %] 97 % (10/10 0700) Weight:  [224 lb 13.9 oz (102 kg)] 224 lb 13.9 oz (102 kg) (10/10 0500)  Filed Weights   11/12/11 0500 11/13/11 0600 11/15/11 0500  Weight: 225 lb 15.5 oz (102.5 kg) 221 lb 5.5 oz (100.4 kg) 224 lb 13.9 oz (102 kg)    Weight change:    Hemodynamic parameters for last 24 hours:    Intake/Output from previous day: 10/09 0701 - 10/10 0700 In: 1190 [P.O.:657; I.V.:483; IV Piggyback:50] Out: 2489 [Urine:125; Chest Tube:60]  Intake/Output this shift:    Current Meds: Scheduled Meds:   . amiodarone  200 mg Oral BID  . calcium acetate  1,334 mg Oral TID WC  . cefTAZidime (FORTAZ)  IV  2 g Intravenous Once  . diltiazem  90 mg Oral Q12H  . docusate sodium  200 mg Oral Daily  . feeding supplement (NEPRO CARB STEADY)  237 mL Oral TID BM  . feeding supplement  30 mL Oral BID WC  . insulin aspart  0-24 Units Subcutaneous Q4H  . multivitamin  1 tablet Oral Daily  . pantoprazole  40 mg Oral Q1200  . sodium chloride  10-40 mL Intracatheter Q12H  . warfarin  4 mg Oral Daily  . Warfarin - Physician Dosing Inpatient   Does not apply q1800  . DISCONTD: cefTAZidime (FORTAZ)  IV  2 g Intravenous Once  . DISCONTD: warfarin  2 mg Oral Daily   Continuous Infusions:   . sodium chloride 20 mL/hr at 11/13/11 2200  . heparin 1,500 Units/hr (11/15/11 0235)    PRN Meds:.bisacodyl, cloNIDine, fentaNYL, levalbuterol, magnesium hydroxide, metoprolol, ondansetron (ZOFRAN) IV, oxyCODONE, phenol, sodium chloride, temazepam, DISCONTD:  HYDROmorphone (DILAUDID) injection  General appearance: alert, cooperative and no distress Heart: regular rate and rhythm Lungs: dim right> left base Abdomen: soft, nontender Extremities: min edema Wound: incis healing well  Lab Results: CBC: Basename 11/15/11 0429 11/14/11 1424  WBC 12.8* 15.6*  HGB 7.8* 8.1*  HCT 23.2* 23.9*  PLT 327 351   BMET:  Basename 11/15/11 0429 11/14/11 0439  NA 130* 126*  K 4.7 4.8  CL 96 92*  CO2 23 18*  GLUCOSE 150* 106*  BUN 45* 72*  CREATININE 5.22* 6.77*  CALCIUM 8.7 8.8    PT/INR:  Basename 11/15/11 0429  LABPROT 16.5*  INR 1.37   Radiology: Dg Chest Port 1 View  11/15/2011  *RADIOLOGY REPORT*  Clinical Data: Dialysis catheter  PORTABLE CHEST - 1 VIEW  Comparison: 11/14/2011  Findings: Low lung volumes with patchy bibasilar atelectasis. Right upper lobe atelectasis/contusion related to recent prior chest tube.  No pleural effusion or pneumothorax.  Stable cardiomegaly.  Median sternotomy.  Midline  skin staples.  Stable right dual lumen dialysis catheter with associated postsurgical changes.  Stable left subclavian venous catheter.  IMPRESSION: Interval removal of right chest tube.  No pneumothorax is seen.  Otherwise unchanged.   Original Report Authenticated By: Charline Bills, M.D.    Dg Chest Port 1 View  11/14/2011  *RADIOLOGY REPORT*  Clinical Data: Renal failure, dialysis catheter insertion.  PORTABLE CHEST - 1 VIEW  Comparison: 11/13/2011.  Findings: Trachea is midline.  Heart is enlarged, stable.  Sternal wires are stable in position.  Surgical skin staples project over the midchest.  Right IJ dialysis catheter tips project over the SVC and SVC/RA junction, respectively.  Right chest tube remains in place.  Surgical skin staples project over the right axillary  region.  Lungs are low in volume with mild bibasilar atelectasis.  Tiny bilateral pleural effusions.  No definite pneumothorax.  IMPRESSION: Low lung volumes with mild bibasilar atelectasis and small bilateral pleural effusions.   Original Report Authenticated By: Reyes Ivan, M.D.    Dg Chest Port 1 View  11/13/2011  *RADIOLOGY REPORT*  Clinical Data: Diatek catheter placement  PORTABLE CHEST - 1 VIEW  Comparison: 11/13/2011  Findings: Right Diatek catheter tip in the SVC.  No pneumothorax. Left subclavian central venous catheter tip in the SVC, unchanged.  Left chest tube has been removed since the prior study.  No pneumothorax.  Right chest tube remains in place without pneumothorax.  Negative for heart failure.  Mild bibasilar atelectasis.  IMPRESSION: Satisfactory Diatek catheter placement without pneumothorax.   Left chest tube removal without pneumothorax.   Original Report Authenticated By: Camelia Phenes, M.D.    Dg Fluoro Guide Cv Line-no Report  11/13/2011  CLINICAL DATA: diatek insertion   FLOURO GUIDE CV LINE  Fluoroscopy was utilized by the requesting physician.  No radiographic  interpretation.       Assessment/Plan: S/P Procedure(s) (LRB): INSERTION OF DIALYSIS CATHETER (N/A)  1. Steady progress 2 renal function improved, 100 cc uo overnight, prob dialysis tomorrow but prob will cont to return kidney function 3 hemodynamics stable, some HTN 4 AC RX- continue 5 anemia stable/ leukocytosis improved,   GOLD,WAYNE E 11/15/2011 8:15 AM  Patient maintaining stable hemodynamics and ambulating in the hallway. Dialysis can be performed in dialysis unit. Plan on transfer to inpatient rehabilitation CIR unit tomorrow after hemodialysis. Continue with daily Coumadin for preoperative diagnosis of pulmonary embolus to right lung on VQ scan. Continue IV heparin until INR is 2.0

## 2011-11-15 NOTE — Progress Notes (Signed)
CARDIAC REHAB PHASE I   PRE:  Rate/Rhythm: 85 SR    BP: sitting 150/70    SaO2: 93 1L  MODE:  Ambulation: 150 ft   POST:  Rate/Rhythm: 93 SR    BP: sitting 152/70     SaO2: 97 2L  Pt stood fairly easy. Unsteady on feet walking with swaying at times. Used RW, 2L O2, assist x2 and gait belt. Pt became dyspneic but did not want to admit he needed to stop. Forced pt to rest at halfway. Slowed breathing. Admits to SOB and extreme exertion. To recliner after walk with VSS. Seemed to have slow but appropriate thinking process. Will f/u tomorrow with education, hopefully with wife present. [phone]  Harriet Masson CES, ACSM

## 2011-11-15 NOTE — Progress Notes (Signed)
S: Felt SOB for few hours last night, better now.  Numbness and decreased function Rt hand O:BP 140/76  Pulse 88  Temp 99.4 F (37.4 C) (Oral)  Resp 21  Ht 5\' 8"  (1.727 m)  Wt 102 kg (224 lb 13.9 oz)  BMI 34.19 kg/m2  SpO2 97%  Intake/Output Summary (Last 24 hours) at 11/15/11 0757 Last data filed at 11/15/11 0700  Gross per 24 hour  Intake   1190 ml  Output   2489 ml  Net  -1299 ml   Weight change:  Gen: Awake and alert CVS:RRR Resp:crackles Rt base Abd:+ BS NTND Ext:no edema  decreased ability to close Rt hand NEURO: CNI Ox3 RT IJ perm cath      . amiodarone  200 mg Oral BID  . calcium acetate  1,334 mg Oral TID WC  . cefTAZidime (FORTAZ)  IV  2 g Intravenous Once  . diltiazem  90 mg Oral Q12H  . docusate sodium  200 mg Oral Daily  . feeding supplement (NEPRO CARB STEADY)  237 mL Oral TID BM  . feeding supplement  30 mL Oral BID WC  . insulin aspart  0-24 Units Subcutaneous Q4H  . multivitamin  1 tablet Oral Daily  . pantoprazole  40 mg Oral Q1200  . sodium chloride  10-40 mL Intracatheter Q12H  . warfarin  4 mg Oral Daily  . Warfarin - Physician Dosing Inpatient   Does not apply q1800  . DISCONTD: cefTAZidime (FORTAZ)  IV  2 g Intravenous Once  . DISCONTD: warfarin  2 mg Oral Daily   Dg Chest Port 1 View  11/14/2011  *RADIOLOGY REPORT*  Clinical Data: Renal failure, dialysis catheter insertion.  PORTABLE CHEST - 1 VIEW  Comparison: 11/13/2011.  Findings: Trachea is midline.  Heart is enlarged, stable.  Sternal wires are stable in position.  Surgical skin staples project over the midchest.  Right IJ dialysis catheter tips project over the SVC and SVC/RA junction, respectively.  Right chest tube remains in place.  Surgical skin staples project over the right axillary region.  Lungs are low in volume with mild bibasilar atelectasis.  Tiny bilateral pleural effusions.  No definite pneumothorax.  IMPRESSION: Low lung volumes with mild bibasilar atelectasis and small  bilateral pleural effusions.   Original Report Authenticated By: Reyes Ivan, M.D.    Dg Chest Port 1 View  11/13/2011  *RADIOLOGY REPORT*  Clinical Data: Diatek catheter placement  PORTABLE CHEST - 1 VIEW  Comparison: 11/13/2011  Findings: Right Diatek catheter tip in the SVC.  No pneumothorax. Left subclavian central venous catheter tip in the SVC, unchanged.  Left chest tube has been removed since the prior study.  No pneumothorax.  Right chest tube remains in place without pneumothorax.  Negative for heart failure.  Mild bibasilar atelectasis.  IMPRESSION: Satisfactory Diatek catheter placement without pneumothorax.   Left chest tube removal without pneumothorax.   Original Report Authenticated By: Camelia Phenes, M.D.    Dg Fluoro Guide Cv Line-no Report  11/13/2011  CLINICAL DATA: diatek insertion   FLOURO GUIDE CV LINE  Fluoroscopy was utilized by the requesting physician.  No radiographic  interpretation.     BMET    Component Value Date/Time   NA 130* 11/15/2011 0429   K 4.7 11/15/2011 0429   CL 96 11/15/2011 0429   CO2 23 11/15/2011 0429   GLUCOSE 150* 11/15/2011 0429   BUN 45* 11/15/2011 0429   CREATININE 5.22* 11/15/2011 0429   CALCIUM 8.7  11/15/2011 0429   GFRNONAA 11* 11/15/2011 0429   GFRAA 13* 11/15/2011 0429   CBC    Component Value Date/Time   WBC 12.8* 11/15/2011 0429   RBC 2.73* 11/15/2011 0429   HGB 7.8* 11/15/2011 0429   HCT 23.2* 11/15/2011 0429   PLT 327 11/15/2011 0429   MCV 85.0 11/15/2011 0429   MCH 28.6 11/15/2011 0429   MCHC 33.6 11/15/2011 0429   RDW 16.3* 11/15/2011 0429   LYMPHSABS 1.5 10/29/2011 1601   MONOABS 0.7 10/29/2011 1601   EOSABS 0.1 10/29/2011 1601   BASOSABS 0.0 10/29/2011 1601     Assessment: 1. ARF due to contrast nephropathy and ATN 2. HTN 3. SP Ascending Aortic dissection repair  Plan: 1. HD in AM 2. 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

## 2011-11-15 NOTE — Progress Notes (Signed)
Physical Therapy Treatment Patient Details Name: PACEN PARADEE MRN: [id number] DOB: 1955/05/15 Today's Date: 11/15/2011 Time: 4098-1191 PT Time Calculation (min): 23 min  PT Assessment / Plan / Recommendation Comments on Treatment Session  Pt admitted s/p thoracic aortic dissection with repair and is very motivated to progress with PT. Pt able to increase activity tolerance today with significantly improved ambulation distance/independence.    Follow Up Recommendations  Post acute inpatient     Does the patient have the potential to tolerate intense rehabilitation  Yes, Recommend IP Rehab Screening  Barriers to Discharge        Equipment Recommendations  Rolling walker with 5" wheels;3 in 1 bedside comode    Recommendations for Other Services Rehab consult;OT consult  Frequency Min 3X/week   Plan Discharge plan remains appropriate;Frequency remains appropriate    Precautions / Restrictions Precautions Precautions: Sternal;Fall Precaution Comments: Re-educated on sternal precautions. Restrictions Weight Bearing Restrictions: No   Pertinent Vitals/Pain None    Mobility  Bed Mobility Bed Mobility: Not assessed Transfers Transfers: Sit to Stand;Stand to Sit Sit to Stand: 4: Min assist;Without upper extremity assist;From chair/3-in-1 Stand to Sit: 4: Min assist;Without upper extremity assist;To chair/3-in-1 Details for Transfer Assistance: Assist for balance with cues to eliminate use of bilateral UEs due to sternal precautions. Cues for safety. Ambulation/Gait Ambulation/Gait Assistance: 1: +2 Total assist Ambulation/Gait: Patient Percentage: 80% Ambulation Distance (Feet): 100 Feet Assistive device: Rolling walker Ambulation/Gait Assistance Details: Assist for balance with cues for tall posture and safety inside RW.  Gait Pattern: Step-through pattern;Decreased stride length;Trunk flexed Stairs: No Wheelchair Mobility Wheelchair Mobility: No    Exercises     PT  Diagnosis:    PT Problem List:   PT Treatment Interventions:     PT Goals Acute Rehab PT Goals PT Goal Formulation: With patient Time For Goal Achievement: 11/27/11 Potential to Achieve Goals: Good PT Goal: Sit to Stand - Progress: Progressing toward goal PT Goal: Stand to Sit - Progress: Progressing toward goal PT Goal: Ambulate - Progress: Progressing toward goal  Visit Information  Last PT Received On: 11/15/11 Assistance Needed: +2 (Lines/Leads)    Subjective Data  Subjective: "I would love to do this." Patient Stated Goal: Return home.   Cognition  Overall Cognitive Status: Appears within functional limits for tasks assessed/performed Arousal/Alertness: Awake/alert Orientation Level: Oriented X4 / Intact Behavior During Session: [facility] for tasks performed    Balance  Balance Balance Assessed: No  End of Session PT - End of Session Equipment Utilized During Treatment: Gait belt Activity Tolerance: Patient tolerated treatment well Patient left: in chair;with call bell/phone within reach Nurse Communication: Mobility status   GP     Cephus Shelling 11/15/2011, 9:22 AM  11/15/2011 Cephus Shelling, PT, DPT [phone]

## 2011-11-15 NOTE — Plan of Care (Signed)
Problem: Phase III Progression Outcomes Goal: Time patient transferred to PCTU/Telemetry POD Outcome: Completed/Met Date Met:  11/15/11 1210

## 2011-11-16 ENCOUNTER — Inpatient Hospital Stay (HOSPITAL_COMMUNITY): Payer: PRIVATE HEALTH INSURANCE

## 2011-11-16 ENCOUNTER — Inpatient Hospital Stay (HOSPITAL_COMMUNITY)
Admission: RE | Admit: 2011-11-16 | Discharge: 2011-11-27 | DRG: 945 | Disposition: A | Discharge status: Home / Self Care | Payer: PRIVATE HEALTH INSURANCE | Source: Ambulatory Visit | Attending: Physical Medicine & Rehabilitation | Admitting: Physical Medicine & Rehabilitation

## 2011-11-16 ENCOUNTER — Encounter (HOSPITAL_COMMUNITY): Payer: Self-pay | Admitting: Vascular Surgery

## 2011-11-16 DIAGNOSIS — Z9889 Other specified postprocedural states: Secondary | ICD-10-CM

## 2011-11-16 DIAGNOSIS — N170 Acute kidney failure with tubular necrosis: Secondary | ICD-10-CM | POA: Diagnosis present

## 2011-11-16 DIAGNOSIS — I4891 Unspecified atrial fibrillation: Secondary | ICD-10-CM | POA: Diagnosis present

## 2011-11-16 DIAGNOSIS — N179 Acute kidney failure, unspecified: Secondary | ICD-10-CM

## 2011-11-16 DIAGNOSIS — R5381 Other malaise: Secondary | ICD-10-CM

## 2011-11-16 DIAGNOSIS — N2581 Secondary hyperparathyroidism of renal origin: Secondary | ICD-10-CM | POA: Diagnosis present

## 2011-11-16 DIAGNOSIS — Z87891 Personal history of nicotine dependence: Secondary | ICD-10-CM

## 2011-11-16 DIAGNOSIS — I359 Nonrheumatic aortic valve disorder, unspecified: Secondary | ICD-10-CM

## 2011-11-16 DIAGNOSIS — I129 Hypertensive chronic kidney disease with stage 1 through stage 4 chronic kidney disease, or unspecified chronic kidney disease: Secondary | ICD-10-CM | POA: Diagnosis present

## 2011-11-16 DIAGNOSIS — N189 Chronic kidney disease, unspecified: Secondary | ICD-10-CM | POA: Diagnosis present

## 2011-11-16 DIAGNOSIS — J9600 Acute respiratory failure, unspecified whether with hypoxia or hypercapnia: Secondary | ICD-10-CM

## 2011-11-16 DIAGNOSIS — Z5189 Encounter for other specified aftercare: Principal | ICD-10-CM

## 2011-11-16 DIAGNOSIS — K5900 Constipation, unspecified: Secondary | ICD-10-CM | POA: Diagnosis present

## 2011-11-16 DIAGNOSIS — D649 Anemia, unspecified: Secondary | ICD-10-CM | POA: Diagnosis present

## 2011-11-16 DIAGNOSIS — Z992 Dependence on renal dialysis: Secondary | ICD-10-CM

## 2011-11-16 DIAGNOSIS — I7100 Dissection of unspecified site of aorta: Secondary | ICD-10-CM

## 2011-11-16 DIAGNOSIS — Z7901 Long term (current) use of anticoagulants: Secondary | ICD-10-CM

## 2011-11-16 DIAGNOSIS — I2542 Coronary artery dissection: Secondary | ICD-10-CM | POA: Diagnosis present

## 2011-11-16 DIAGNOSIS — I2782 Chronic pulmonary embolism: Secondary | ICD-10-CM | POA: Diagnosis present

## 2011-11-16 LAB — CBC
HCT: 22.5 % — ABNORMAL LOW (ref 39.0–52.0)
Hemoglobin: 7.4 g/dL — ABNORMAL LOW (ref 13.0–17.0)
MCH: 28 pg (ref 26.0–34.0)
MCHC: 32.9 g/dL (ref 30.0–36.0)
MCV: 85.2 fL (ref 78.0–100.0)
Platelets: 352 10*3/uL (ref 150–400)
RBC: 2.64 MIL/uL — ABNORMAL LOW (ref 4.22–5.81)
RDW: 15.8 % — ABNORMAL HIGH (ref 11.5–15.5)
WBC: 11.6 10*3/uL — ABNORMAL HIGH (ref 4.0–10.5)

## 2011-11-16 LAB — COMPREHENSIVE METABOLIC PANEL
ALT: 86 U/L — ABNORMAL HIGH (ref 0–53)
AST: 28 U/L (ref 0–37)
Albumin: 2.2 g/dL — ABNORMAL LOW (ref 3.5–5.2)
Alkaline Phosphatase: 126 U/L — ABNORMAL HIGH (ref 39–117)
BUN: 68 mg/dL — ABNORMAL HIGH (ref 6–23)
CO2: 20 mEq/L (ref 19–32)
Calcium: 8.9 mg/dL (ref 8.4–10.5)
Chloride: 88 mEq/L — ABNORMAL LOW (ref 96–112)
Creatinine, Ser: 7.75 mg/dL — ABNORMAL HIGH (ref 0.50–1.35)
GFR calc Af Amer: 8 mL/min — ABNORMAL LOW (ref >90)
GFR calc non Af Amer: 7 mL/min — ABNORMAL LOW (ref >90)
Glucose, Bld: 162 mg/dL — ABNORMAL HIGH (ref 70–99)
Potassium: 4.9 mEq/L (ref 3.5–5.1)
Sodium: 126 mEq/L — ABNORMAL LOW (ref 135–145)
Total Bilirubin: 0.9 mg/dL (ref 0.3–1.2)
Total Protein: 6.2 g/dL (ref 6.0–8.3)

## 2011-11-16 LAB — PROTIME-INR
INR: 1.83 — ABNORMAL HIGH (ref 0.00–1.49)
Prothrombin Time: 20.5 seconds — ABNORMAL HIGH (ref 11.6–15.2)

## 2011-11-16 LAB — PHOSPHORUS: Phosphorus: 7.8 mg/dL — ABNORMAL HIGH (ref 2.3–4.6)

## 2011-11-16 LAB — PREPARE RBC (CROSSMATCH)

## 2011-11-16 LAB — GLUCOSE, CAPILLARY
Glucose-Capillary: 115 mg/dL — ABNORMAL HIGH (ref 70–99)
Glucose-Capillary: 134 mg/dL — ABNORMAL HIGH (ref 70–99)
Glucose-Capillary: 135 mg/dL — ABNORMAL HIGH (ref 70–99)

## 2011-11-16 LAB — HEPATITIS B SURFACE ANTIBODY,QUALITATIVE: Hep B S Ab: NEGATIVE

## 2011-11-16 LAB — HEPARIN LEVEL (UNFRACTIONATED): Heparin Unfractionated: >2 IU/mL — ABNORMAL HIGH (ref 0.30–0.70)

## 2011-11-16 MED ORDER — RENA-VITE PO TABS
1.0000 | ORAL_TABLET | Freq: Every day | ORAL | Status: DC
  Administered 2011-11-16 – 2011-11-26 (×11): 1 via ORAL
  Filled 2011-11-16 (×12): qty 1

## 2011-11-16 MED ORDER — DILTIAZEM HCL ER 90 MG PO CP12
90.0000 mg | ORAL_CAPSULE | Freq: Two times a day (BID) | ORAL | Status: DC
  Administered 2011-11-16 – 2011-11-27 (×22): 90 mg via ORAL
  Filled 2011-11-16 (×25): qty 1

## 2011-11-16 MED ORDER — LABETALOL HCL 5 MG/ML IV SOLN
10.0000 mg | INTRAVENOUS | Status: DC | PRN
  Filled 2011-11-16: qty 4

## 2011-11-16 MED ORDER — ACETAMINOPHEN 325 MG PO TABS: 325.0000 mg | ORAL_TABLET | ORAL | Status: DC | PRN

## 2011-11-16 MED ORDER — PANTOPRAZOLE SODIUM 40 MG PO TBEC
40.0000 mg | DELAYED_RELEASE_TABLET | Freq: Every day | ORAL | Status: DC
  Administered 2011-11-17 – 2011-11-27 (×10): 40 mg via ORAL
  Filled 2011-11-16 (×11): qty 1

## 2011-11-16 MED ORDER — CALCIUM ACETATE 667 MG PO CAPS: 1334.0000 mg | ORAL_CAPSULE | Freq: Three times a day (TID) | ORAL | Status: DC

## 2011-11-16 MED ORDER — METOPROLOL TARTRATE 12.5 MG HALF TABLET: 12.5000 mg | ORAL_TABLET | Freq: Two times a day (BID) | ORAL | Status: DC

## 2011-11-16 MED ORDER — METOPROLOL TARTRATE 12.5 MG HALF TABLET
12.5000 mg | ORAL_TABLET | Freq: Two times a day (BID) | ORAL | Status: DC
  Administered 2011-11-16: 12.5 mg via ORAL
  Filled 2011-11-16 (×2): qty 1

## 2011-11-16 MED ORDER — PRO-STAT SUGAR FREE PO LIQD
30.0000 mL | Freq: Every day | ORAL | Status: DC
  Administered 2011-11-16: 30 mL via ORAL
  Filled 2011-11-16: qty 30

## 2011-11-16 MED ORDER — AMIODARONE HCL 200 MG PO TABS: 200.0000 mg | ORAL_TABLET | Freq: Two times a day (BID) | ORAL | Status: DC

## 2011-11-16 MED ORDER — AMIODARONE HCL 200 MG PO TABS
200.0000 mg | ORAL_TABLET | Freq: Two times a day (BID) | ORAL | Status: DC
  Administered 2011-11-16 – 2011-11-27 (×21): 200 mg via ORAL
  Filled 2011-11-16 (×25): qty 1

## 2011-11-16 MED ORDER — DSS 100 MG PO CAPS: 200.0000 mg | ORAL_CAPSULE | Freq: Every day | ORAL | Status: DC

## 2011-11-16 MED ORDER — PRO-STAT SUGAR FREE PO LIQD: 30.0000 mL | Freq: Two times a day (BID) | ORAL | Status: DC

## 2011-11-16 MED ORDER — NEPRO/CARBSTEADY PO LIQD: 237.0000 mL | Freq: Three times a day (TID) | ORAL | Status: DC

## 2011-11-16 MED ORDER — HEPARIN (PORCINE) IN NACL 100-0.45 UNIT/ML-% IJ SOLN
2200.0000 [IU]/h | INTRAMUSCULAR | Status: DC
  Administered 2011-11-16: 1500 [IU]/h via INTRAVENOUS
  Administered 2011-11-17: 1700 [IU]/h via INTRAVENOUS
  Administered 2011-11-18: 2200 [IU]/h via INTRAVENOUS
  Filled 2011-11-16 (×5): qty 250

## 2011-11-16 MED ORDER — WARFARIN - PHARMACIST DOSING INPATIENT: Freq: Every day | Status: DC

## 2011-11-16 MED ORDER — NEPRO/CARBSTEADY PO LIQD
237.0000 mL | Freq: Every day | ORAL | Status: DC
  Filled 2011-11-16 (×2): qty 237

## 2011-11-16 MED ORDER — PANTOPRAZOLE SODIUM 40 MG PO TBEC: 40.0000 mg | DELAYED_RELEASE_TABLET | Freq: Every day | ORAL | Status: DC

## 2011-11-16 MED ORDER — CLONIDINE HCL 0.1 MG PO TABS
0.1000 mg | ORAL_TABLET | Freq: Two times a day (BID) | ORAL | Status: DC
  Filled 2011-11-16: qty 1

## 2011-11-16 MED ORDER — CALCIUM ACETATE 667 MG PO CAPS
1334.0000 mg | ORAL_CAPSULE | Freq: Three times a day (TID) | ORAL | Status: DC
  Administered 2011-11-17 – 2011-11-26 (×24): 1334 mg via ORAL
  Filled 2011-11-16 (×32): qty 2

## 2011-11-16 MED ORDER — NEPRO/CARBSTEADY PO LIQD: 237.0000 mL | Freq: Every day | ORAL | Status: DC

## 2011-11-16 MED ORDER — PHENOL 1.4 % MT LIQD
1.0000 | OROMUCOSAL | Status: DC | PRN
  Filled 2011-11-16: qty 177

## 2011-11-16 MED ORDER — OXYCODONE HCL 5 MG PO TABS
ORAL_TABLET | ORAL | Status: AC
  Administered 2011-11-16: 10 mg
  Filled 2011-11-16: qty 2

## 2011-11-16 MED ORDER — WARFARIN SODIUM 4 MG PO TABS
4.0000 mg | ORAL_TABLET | Freq: Every day | ORAL | Status: DC
  Filled 2011-11-16: qty 1

## 2011-11-16 MED ORDER — RENA-VITE PO TABS: 1.0000 | ORAL_TABLET | Freq: Every day | ORAL | Status: DC

## 2011-11-16 MED ORDER — CLONIDINE HCL 0.1 MG PO TABS: 0.1000 mg | ORAL_TABLET | ORAL | Status: DC | PRN

## 2011-11-16 MED ORDER — ONDANSETRON HCL 4 MG/2ML IJ SOLN: 4.0000 mg | Freq: Four times a day (QID) | INTRAMUSCULAR | Status: DC | PRN

## 2011-11-16 MED ORDER — OXYCODONE HCL 5 MG PO TABS
5.0000 mg | ORAL_TABLET | ORAL | Status: DC | PRN
  Administered 2011-11-17: 5 mg via ORAL
  Administered 2011-11-18: 10 mg via ORAL
  Administered 2011-11-22 – 2011-11-23 (×3): 5 mg via ORAL
  Administered 2011-11-24: 10 mg via ORAL
  Administered 2011-11-24 – 2011-11-27 (×5): 5 mg via ORAL
  Filled 2011-11-16 (×6): qty 1
  Filled 2011-11-16: qty 2
  Filled 2011-11-16 (×2): qty 1
  Filled 2011-11-16: qty 2

## 2011-11-16 MED ORDER — WARFARIN SODIUM 4 MG PO TABS: 4.0000 mg | ORAL_TABLET | Freq: Every day | ORAL | Status: DC

## 2011-11-16 MED ORDER — ONDANSETRON HCL 4 MG PO TABS
4.0000 mg | ORAL_TABLET | Freq: Four times a day (QID) | ORAL | Status: DC | PRN
  Administered 2011-11-17: 4 mg via ORAL
  Filled 2011-11-16: qty 1

## 2011-11-16 MED ORDER — DILTIAZEM HCL ER 90 MG PO CP12: 90.0000 mg | ORAL_CAPSULE | Freq: Two times a day (BID) | ORAL | Status: DC

## 2011-11-16 MED ORDER — DOCUSATE SODIUM 100 MG PO CAPS
200.0000 mg | ORAL_CAPSULE | Freq: Every day | ORAL | Status: DC
  Administered 2011-11-17 – 2011-11-20 (×4): 200 mg via ORAL
  Filled 2011-11-16 (×6): qty 2

## 2011-11-16 MED ORDER — SORBITOL 70 % SOLN
30.0000 mL | Freq: Every day | Status: DC | PRN
  Administered 2011-11-19 – 2011-11-21 (×2): 30 mL via ORAL
  Filled 2011-11-16 (×2): qty 30

## 2011-11-16 MED ORDER — OXYCODONE HCL 5 MG PO TABS: 5.0000 mg | ORAL_TABLET | ORAL | Status: DC | PRN

## 2011-11-16 MED ORDER — LEVALBUTEROL HCL 0.63 MG/3ML IN NEBU
0.6300 mg | INHALATION_SOLUTION | RESPIRATORY_TRACT | Status: DC | PRN
  Administered 2011-11-17 – 2011-11-24 (×10): 0.63 mg via RESPIRATORY_TRACT
  Filled 2011-11-16 (×6): qty 3

## 2011-11-16 MED FILL — Medication: Qty: 1 | Status: AC

## 2011-11-16 NOTE — Progress Notes (Signed)
D/C EPW & CTS per protocol and as ordered, pt tolerated well, all ends intact, no bleeding/ectopy noted. Steris applied to CTS sites. Pt reminded to lie supine approximately one hour. INR 1.83, VSS.  Will continue to monitor.

## 2011-11-16 NOTE — Discharge Summary (Signed)
[address]            [place] [address]          [phone]         Discharge Summary  Name: Gabriel Tran DOB: 1956/01/28 56 y.o. MRN: [id number]   Admission Date: 10/29/2011 Discharge Date: 11/16/2011    Admitting Diagnosis: Chest pain Hypertension    Discharge Diagnosis:  Acute type A ascending aortic dissection  Moderate aortic insufficiency Hypertension Expected postoperative blood loss anemia Postoperative cardiac tamponade Postoperative cardiac arrest Right pulmonary embolus Acute renal failure secondary to acute tubular necrosis E. Coli pneumonia Atrial flutter  Past Medical History  Diagnosis Date  . Hypertension   . Hyperglycemia     a. A1C 5.9 in Sept 2013.  Marland Kitchen RBBB     Procedures: EMERGENCY REPAIR OF TYPE A ASCENDING AORTIC DISSECTION- 10/30/2011  Replacement of ascending aorta with 30 mm Hemashield graft and reconstruction of the aortic arch with hemiarch repair to the left subclavian and left carotid, with a separate 10 mm side graft to the innominate artery  Resuspension repair of the aortic valve  Right axillary cannulation for antegrade cerebral perfusion during hypothermic circulatory arrest  EMERGENCY STERNOTOMY FOR TAMPONADE (IN ICU) -11/03/2011  Transport to OR for sternal and mediastinal exploration, irrigation and closure  MEDIASTINAL EXPLORATION, STERNAL CLOSURE - 11/06/2011  INSERTION OF RIGHT INTERNAL JUGULAR DIATEK CATHETER- 11/13/2011   HPI:  The patient is a 56 y.o. male who presented to the ER at [facility] on the date of admission complaining of acute onset chest discomfort radiating to the neck.  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  He was brought by EMS to the ER and was subsequently admitted for further evaluation.     Hospital Course:  The patient was admitted to [facility] on 10/29/2011.   His cardiac enzymes were negative.  Cardiology saw the patient and noted a right bundle branch block on EKG.  He continued to have stuttering chest pain only transiently relieved by nitroglycerin. He was felt to have symptoms of unstable angina, and underwent an urgent catheterization by Dr. Clifton James on 10/30/2011. This revealed a patent left main and LAD system.  The right coronary could not be engaged.  A root shot demonstrated an intimal flap concerning for aortic dissection.  An emergent CT angiogram confirmed a type A dissection with false lumen extending down to the mesenteric-celiac vessels.  Cardiac surgery was consulted, and Dr. Donata Clay saw the patient and reviewed his films.  He recommended proceeding emergently to the operating room for repair.  All risks, benefits and alternatives of surgery were explained in detail, and the patient agreed to proceed. The patient was taken to the operating room and underwent the above procedure.    The initial postoperative course was stable, but on postop day 4, the patient was noted to have increased creatinine and increased LFTs, with progressively decreasing urine output.  A nephrology consult was obtained, and it was felt this was related to acute tubular necrosis from dye exposure and hypoperfusion.  A renal ultrasound revealed both renal arteries to be patent.  He also developed worsening hypoxia with left lower lobe atelectasis/infiltrate on x-ray.  A VQ scan showed intermediate probability for right pulmonary embolus. He was started on  low dose Heparin, and initially was stable.  However, on 11/03/2011, the patient developed apnea and unresponsiveness, and was noted to have pulseless electrical activity. A code blue was initiated, with CPR and intubation at the bedside.  Dr. Donata Clay performed a sternotomy at the bedside emergently and evacuated clot, improving his blood pressures.  He was then transported to the OR for further exploration.     He returned  to the ICU and remained intubated on pressors and Amiodarone. Because of concerns of thrombocytopenia with the previous initiation of Heparin, bivalirudin was started for his PE. A heparin induced thrombocytopenia panel was ultimately negative, and his platelets stabilized.  His renal function continued to worsen, and CVVH was initiated.  He was able to return to the OR on 11/06/2011 for closure of his chest.  He was placed on tube feeds for nutritional support.  He made slow but steady improvement, and pressors were weaned.  He was treated with nitric oxide and aggressive pulmonary toilet measures while on the ventilator. He was noted to have leukocytosis and E.coli in his sputum, and was started on Fortaz.  He was ultimately extubated on 11/10/2011.  He developed atrial flutter and attempt to rapid atrial pace were unsuccessful.  His rates remained stable and he was started on Cardizem.  His renal function has been slow to recover, and he has needed continued dialysis.  He was seen by vascular surgery for hemodialysis access, and a right IJ Diatek catheter was placed by Dr. Darrick Penna on 11/13/2011.    He has been transferred to the stepdown unit.  He has converted to sinus rhythm, and his blood pressures are trending up.  He has been restarted on a low dose beta blocker. His leukocytosis has improved, and he has completed a full course of IV antibiotics for pneumonia.  His pulmonary status is stable.  He has been started on Coumadin for his pulmonary embolus, and his INR is trending up. LFTs have trended back down.  He remains anemic, and this is monitored closely.  His thrombocytopenia has resolved.  He is dialyzing on the inpatient unit without difficulty.  He is tolerating a regular diet and TNA has been stopped.  He is working with PT and OT on reconditioning.  It is felt he would benefit from further therapies on the [facility] Inpatient Rehab Unit.  A bed is available, and he is medically ready for transfer on  11/16/2011.    Recent vital signs:  Filed Vitals:   11/16/11 1115  BP: 152/83  Pulse: 83  Temp: 97.9 F (36.6 C)  Resp: 18    Recent laboratory studies:  CBC: Basename 11/16/11 0500 11/15/11 0429  WBC 11.6* 12.8*  HGB 7.4* 7.8*  HCT 22.5* 23.2*  PLT 352 327   BMET:  Basename 11/16/11 0500 11/15/11 0429  NA 126* 130*  K 4.9 4.7  CL 88* 96  CO2 20 23  GLUCOSE 162* 150*  BUN 68* 45*  CREATININE 7.75* 5.22*  CALCIUM 8.9 8.7    PT/INR:  Basename 11/16/11 0500  LABPROT 20.5*  INR 1.83*     Discharge Medications:     Medication List     As of 11/16/2011  1:21 PM    STOP taking these medications         amLODipine 5 MG tablet   Commonly known as: NORVASC      TAKE these medications         amiodarone 200 MG tablet   Commonly  known as: PACERONE   Take 1 tablet (200 mg total) by mouth 2 (two) times daily.      calcium acetate 667 MG capsule   Commonly known as: PHOSLO   Take 2 capsules (1,334 mg total) by mouth 3 (three) times daily with meals.      cloNIDine 0.1 MG tablet   Commonly known as: CATAPRES   Take 1 tablet (0.1 mg total) by mouth every 4 (four) hours as needed (SBP > 160 or DBP > 105).      diltiazem 90 MG 12 hr capsule   Commonly known as: CARDIZEM SR   Take 1 capsule (90 mg total) by mouth every 12 (twelve) hours.      DSS 100 MG Caps   Take 200 mg by mouth daily.      feeding supplement (NEPRO CARB STEADY) Liqd   Take 237 mLs by mouth 3 (three) times daily between meals.      feeding supplement (NEPRO CARB STEADY) Liqd   Take 237 mLs by mouth daily at 3 pm.      feeding supplement Liqd   Take 30 mLs by mouth 2 (two) times daily with a meal.      metoprolol tartrate 12.5 mg Tabs   Commonly known as: LOPRESSOR   Take 0.5 tablets (12.5 mg total) by mouth 2 (two) times daily.      multivitamin Tabs tablet   Take 1 tablet by mouth daily.      oxyCODONE 5 MG immediate release tablet   Commonly known as: Oxy IR/ROXICODONE   Take  1-2 tablets (5-10 mg total) by mouth every 3 (three) hours as needed for pain.      pantoprazole 40 MG tablet   Commonly known as: PROTONIX   Take 1 tablet (40 mg total) by mouth daily at 12 noon.      warfarin 4 MG tablet   Commonly known as: COUMADIN   Take 1 tablet (4 mg total) by mouth daily.          Discharge Instructions:   Continue sternal precautions Diet-renal 80/90-03-09-1198 ml Clean incisions daily with soap and water -may shower Dialysis orders per nephrology     Caesar Mannella H 11/16/2011, 12:16 PM

## 2011-11-16 NOTE — Progress Notes (Addendum)
D/C remaining right upper chest staples per order.  Will continue to monitor.

## 2011-11-16 NOTE — Progress Notes (Signed)
Patient admitted to inpatient rehab at 1745, family at bedside.  Patient alert and oriented, admission package given with explanation.  Side rails up x3, bed alarm in place, call bell within reach.

## 2011-11-16 NOTE — Progress Notes (Signed)
OT Cancellation Note  Patient Details Name: Gabriel Tran MRN: [id number] DOB: 10/09/1955   Cancelled Treatment:    Reason Eval/Treat Not Completed: Patient at procedure or test/ unavailable.  Pt. in HD this am; then eating and fatigued.  Will reattempt this pm as able  Jeani Hawking M [phone] 11/16/2011, 1:06 PM

## 2011-11-16 NOTE — Interval H&P Note (Signed)
Gabriel Tran was admitted today to Inpatient Rehabilitation with the diagnosis of deconditioning after aortic aneurysm repair.  The patient's history has been reviewed, patient examined, and there is no change in status.  Patient continues to be appropriate for intensive inpatient rehabilitation.  I have reviewed the patient's chart and labs.  Questions were answered to the patient's satisfaction.  [name] T 11/16/2011, 7:58 PM

## 2011-11-16 NOTE — Progress Notes (Signed)
  Echocardiogram 2D Echocardiogram has been performed.  Shomari Matusik FRANCES 11/16/2011, 3:53 PM

## 2011-11-16 NOTE — Progress Notes (Addendum)
Nutrition Follow-up  Intervention:    Change frequency of Nepro supplement (425 kcals, 19.1 gm protein) & Prostat liquid protein (100 kcals, 15 gm protein) to daily  Continue RENA-VIT daily RD to follow for nutrition care plan  Assessment:   Transferred out of ICU. Off CVVHD. S/p Diatek catheter insertion 10/8 for hemodialysis. PO intake much improved, 100% per flowsheet records. Receiving Nepro & Prostat liquid protein supplements.  Diet Order:  Renal 80/90-03-09-1198 ml  Meds: Scheduled Meds:   . amiodarone  200 mg Oral BID  . calcium acetate  1,334 mg Oral TID WC  . diltiazem  90 mg Oral Q12H  . docusate sodium  200 mg Oral Daily  . feeding supplement (NEPRO CARB STEADY)  237 mL Oral TID BM  . feeding supplement  30 mL Oral BID WC  . insulin aspart  0-24 Units Subcutaneous TID AC & HS  . moving right along book   Does not apply Once  . multivitamin  1 tablet Oral Daily  . oxyCODONE      . oxyCODONE      . pantoprazole  40 mg Oral Q1200  . sodium chloride  10-40 mL Intracatheter Q12H  . sodium chloride  3 mL Intravenous Q12H  . warfarin  4 mg Oral Daily  . Warfarin - Physician Dosing Inpatient   Does not apply q1800  . DISCONTD: docusate sodium  200 mg Oral Daily   Continuous Infusions:   . sodium chloride 20 mL/hr at 11/13/11 2200  . DISCONTD: heparin 1,500 Units/hr (11/15/11 2007)   PRN Meds:.sodium chloride, bisacodyl, bisacodyl, bisacodyl, cloNIDine, levalbuterol, magnesium hydroxide, ondansetron (ZOFRAN) IV, oxyCODONE, phenol, sodium chloride, sodium chloride, temazepam, DISCONTD: magnesium hydroxide, DISCONTD: metoprolol  Labs:  CMP     Component Value Date/Time   NA 126* 11/16/2011 0500   K 4.9 11/16/2011 0500   CL 88* 11/16/2011 0500   CO2 20 11/16/2011 0500   GLUCOSE 162* 11/16/2011 0500   BUN 68* 11/16/2011 0500   CREATININE 7.75* 11/16/2011 0500   CALCIUM 8.9 11/16/2011 0500   PROT 6.2 11/16/2011 0500   ALBUMIN 2.2* 11/16/2011 0500   AST 28 11/16/2011  0500   ALT 86* 11/16/2011 0500   ALKPHOS 126* 11/16/2011 0500   BILITOT 0.9 11/16/2011 0500   GFRNONAA 7* 11/16/2011 0500   GFRAA 8* 11/16/2011 0500     Intake/Output Summary (Last 24 hours) at 11/16/11 1147 Last data filed at 11/16/11 1115  Gross per 24 hour  Intake    240 ml  Output   2930 ml  Net  -2690 ml    CBG (last 3)   Basename 11/16/11 0601 11/15/11 2047 11/15/11 1636  GLUCAP 94 119* 126*    Weight Status: 103.7 kg (10/11) -- stable  Estimated needs: 2200-2400 kcals, 130-140 gm protein   Nutrition Dx: Inadequate Oral Intake, resolved  New Nutrition Dx: Increased Nutrient Needs r/t hemodialysis as evidenced by estimated nutrition needs, ongoing  Goal: Oral intake with meals & supplements to meet >/= 90% of estimated nutrition needs, met  Monitor: PO & supplemental intake, weight, labs, I/O's   Kirkland Hun, RD, LDN  Pager #: [phone]  After-Hours Pager #: [phone]

## 2011-11-16 NOTE — Progress Notes (Signed)
D/C central line per protocol and as ordered, occlusive dsg applied, pt supine approximately . Pt tolerated well, will continue to monitor.

## 2011-11-16 NOTE — H&P (View-Only) (Signed)
Physical Medicine and Rehabilitation Admission H&P    Chief Complaint  Patient presents with  . Chest Pain  : HPI:Gabriel Tran is a 56 y.o. right-handed male ex smoker as well as history of hypertension admitted 10/29/2011 with chest pain radiating to the neck. Patient was active and independent prior to admission. Noted blood pressure 230/110. EKG revealed right bundle branch block. Chest x-ray showed no acute findings. Cardiac enzymes were negative. Cranial CT scan showed no evidence of acute abnormality.  Patient found to have acute type A. ascending aortic dissection with moderate aortic insufficiency. Underwent emergent repair of ascending aortic dissection 11/01/2011 per Dr. Van trigt. Hospital course with increasing shortness of breath ventilation perfusion scan completed showing intermediate probability for pulmonary embolism. He was placed on low-dose heparin infusion without bolus. Patient became more agitated and developed pulseless electrical activity became hypotensive immediately given CPR and was intubated. Patient underwent emergent sternotomy removing sternal wires/cardiac tamponade 11/04/2011. A chest tube was placed. Patient was extubated 11/10/2011. Developed acute renal failure creatinine increased to 6.22 likely ATN from dye exposure during TAA repair and started on CVVHD. Nephrology continues to follow with permanent dialysis catheter placement and  hemodialysis initiated 11/14/2011. Coumadin has since been initiated for pulmonary emboli and monitoring for any bleeding episodes. He is tolerating a regular consistency diet. Patient received education on sternal precautions. Physical therapy and occupational evaluations completed 11/13/2011 with recommendations of physical medicine rehabilitation consult to consider inpatient rehabilitation services secondary to deconditioning and prolonged intubation. Patient was felt to begin candidate for inpatient rehabilitation services and  was admitted for comprehensive rehabilitation program.   Review of Systems  Cardiovascular: Positive for chest pain and palpitations.  Gastrointestinal: Positive for constipation.  All other systems reviewed and are negative   Past Medical History  Diagnosis Date  . Hypertension   . Hyperglycemia     a. A1C 5.9 in Sept 2013.  . RBBB    Past Surgical History  Procedure Date  . Knee surgery     LEFT 1981  . Carpal tunnel release     2004 RIGHT  . Wisdom tooth extraction   . Thoracic aortic aneurysm repair 10/30/2011    Procedure: THORACIC ASCENDING ANEURYSM REPAIR (AAA);  Surgeon: Peter Van Trigt, MD;  Location: [facility] OR;  Service: Open Heart Surgery;  Laterality: N/A;  Ascending aortic dissection repair, arch reconstruction, aortic valve repair  . Insertion of dialysis catheter 11/06/2011    Procedure: INSERTION OF DIALYSIS CATHETER;  Surgeon: Peter Van Trigt, MD;  Location: [facility] OR;  Service: Thoracic;  Laterality: Right;  . Mediastinal exploration 11/06/2011    Procedure: MEDIASTINAL EXPLORATION;  Surgeon: Peter Van Trigt, MD;  Location: [facility] OR;  Service: Thoracic;  Laterality: N/A;  sternal closure; removal of wound vac   Family History  Problem Relation Age of Onset  . Parkinsonism Mother   . Leukemia Father     AML at 67  . Colon polyps Brother   . Kidney disease Brother   . Heart disease Brother     CABG at age 52.  . Prostate cancer Neg Hx   . Colon cancer Neg Hx   . Heart disease Father     Angioplasty in his 50s, CABG age 64.   Social History:  reports that he quit smoking about 21 years ago. His smoking use included Cigarettes. He does not have any smokeless tobacco history on file. He reports that he drinks alcohol. He reports that he does not use illicit drugs.   Allergies: No Known Allergies Medications Prior to Admission  Medication Sig Dispense Refill  . amLODipine (NORVASC) 5 MG tablet Take 1 tablet (5 mg total) by mouth daily.  90 tablet  3  . metoprolol  (LOPRESSOR) 100 MG tablet Take 1 tablet (100 mg total) by mouth 2 (two) times daily.  180 tablet  3    Home: Home Living Lives With: Spouse Available Help at Discharge: Family;Available 24 hours/day (mother will stay when wife is unavailable) Type of Home: House Home Access: Stairs to enter Entrance Stairs-Number of Steps: 4 Entrance Stairs-Rails: Right Home Layout: One level Bathroom Shower/Tub: Tub/shower unit Bathroom Toilet: Standard Bathroom Accessibility: Yes How Accessible: Accessible via walker Home Adaptive Equipment: None   Functional History: Prior Function Able to Take Stairs?: Reciprically Driving: Yes Vocation: Full time employment  Functional Status:  Mobility: Bed Mobility Bed Mobility: Not assessed Supine to Sit: 3: Mod assist;HOB elevated Sitting - Scoot to Edge of Bed: 4: Min assist Sit to Supine: 3: Mod assist;HOB elevated Transfers Transfers: Sit to Stand;Stand to Sit Sit to Stand: 4: Min assist;Without upper extremity assist;From chair/3-in-1 Sit to Stand: Patient Percentage: 70% Stand to Sit: 4: Min assist;Without upper extremity assist;To chair/3-in-1 Stand to Sit: Patient Percentage: 80% Ambulation/Gait Ambulation/Gait Assistance: 1: +2 Total assist Ambulation/Gait: Patient Percentage: 80% Ambulation Distance (Feet): 100 Feet Assistive device: Rolling walker Ambulation/Gait Assistance Details: Assist for balance with cues for tall posture and safety inside RW.  Gait Pattern: Step-through pattern;Decreased stride length;Trunk flexed Stairs: No Wheelchair Mobility Wheelchair Mobility: No  ADL: ADL Eating/Feeding: Simulated;Set up (using Lt. UE) Where Assessed - Eating/Feeding: Bed level Grooming: Simulated;Wash/dry hands;Wash/dry face;Supervision/safety Where Assessed - Grooming: Unsupported sitting Upper Body Bathing: Simulated;Moderate assistance Where Assessed - Upper Body Bathing: Supported sitting Lower Body Bathing: Simulated;+1  Total assistance Where Assessed - Lower Body Bathing: Supported sit to stand Upper Body Dressing: Performed;Maximal assistance Where Assessed - Upper Body Dressing: Unsupported sitting Lower Body Dressing: Simulated;+1 Total assistance Where Assessed - Lower Body Dressing: Supported sit to stand Toilet Transfer: Simulated;+2 Total assistance Toilet Transfer Method: Sit to stand;Stand pivot Toilet Transfer Equipment: Other (comment) (ambulation and transfer to recliner) Equipment Used: Other (comment) (Pt. pushed wheelchair) Transfers/Ambulation Related to ADLs: Pt. ambulated ~40' with total A +2 (pt ~75%).  Pt. fatigues and becomes somewhat unsteady ADL Comments: Pt. fatigues rapidly.  Requires mod - total A for BADLs except grooming and self feeding due to fatigue/endurance issues.  Pt. with weakness Rt. hand and unable to use Rt. hand for self care activities  Cognition: Cognition Arousal/Alertness: Awake/alert Orientation Level: Oriented X4 Cognition Overall Cognitive Status: Appears within functional limits for tasks assessed/performed Arousal/Alertness: Awake/alert Orientation Level: Oriented X4 / Intact Behavior During Session: WFL for tasks performed Cognition - Other Comments: Pt. recalls information/occurances through the day.  Mildly delayed processing   Blood pressure 154/73, pulse 89, temperature 97.7 F (36.5 C), temperature source Oral, resp. rate 20, height 5' 8" (1.727 m), weight 104.4 kg (230 lb 2.6 oz), SpO2 97.00%. Physical Exam  Vitals reviewed.  Constitutional: He is oriented to person, place, and time. He appears well-developed.  HENT: mucosa pink and moist Head: Normocephalic.  Eyes:  Pupils round and reactive to light  Neck: Neck supple. No thyromegaly present.  Cardiovascular: Normal rate and regular rhythm.  Pulmonary/Chest: Breath sounds normal. He has no wheezes.  Abdominal: Soft. Bowel sounds are normal. He exhibits no distension.  Neurological: He  is alert and oriented to person, place, and time. resasonable memory, insight,   and awareness.  Follows three-step commands. Strength grossly 4/5 in the upper ext and 3-4 in the lower ext's Skin:  Chest tube out. Area clean and dressed. Wounds all clean with minimal drainage Psychiatric: He has a normal mood and affect.  Reduced sensation right second third digits seems improved.    Results for orders placed during the hospital encounter of 10/29/11 (from the past 48 hour(s))  GLUCOSE, CAPILLARY     Status: Abnormal   Collection Time   11/14/11  7:55 AM      Component Value Range Comment   Glucose-Capillary 109 (*) 70 - 99 mg/dL   GLUCOSE, CAPILLARY     Status: Abnormal   Collection Time   11/14/11 11:36 AM      Component Value Range Comment   Glucose-Capillary 101 (*) 70 - 99 mg/dL   PROTIME-INR     Status: Abnormal   Collection Time   11/14/11  2:24 PM      Component Value Range Comment   Prothrombin Time 16.2 (*) 11.6 - 15.2 seconds    INR 1.33  0.00 - 1.49   CBC     Status: Abnormal   Collection Time   11/14/11  2:24 PM      Component Value Range Comment   WBC 15.6 (*) 4.0 - 10.5 K/uL    RBC 2.80 (*) 4.22 - 5.81 MIL/uL    Hemoglobin 8.1 (*) 13.0 - 17.0 g/dL    HCT 23.9 (*) 39.0 - 52.0 %    MCV 85.4  78.0 - 100.0 fL    MCH 28.9  26.0 - 34.0 pg    MCHC 33.9  30.0 - 36.0 g/dL    RDW 16.5 (*) 11.5 - 15.5 %    Platelets 351  150 - 400 K/uL   HEPARIN LEVEL (UNFRACTIONATED)     Status: Abnormal   Collection Time   11/14/11  2:24 PM      Component Value Range Comment   Heparin Unfractionated <0.10 (*) 0.30 - 0.70 IU/mL   GLUCOSE, CAPILLARY     Status: Abnormal   Collection Time   11/14/11  3:39 PM      Component Value Range Comment   Glucose-Capillary 112 (*) 70 - 99 mg/dL   GLUCOSE, CAPILLARY     Status: Abnormal   Collection Time   11/14/11  7:26 PM      Component Value Range Comment   Glucose-Capillary 150 (*) 70 - 99 mg/dL    Comment 1 Notify RN      Comment 2 Documented  in Chart     GLUCOSE, CAPILLARY     Status: Normal   Collection Time   11/14/11 11:33 PM      Component Value Range Comment   Glucose-Capillary 82  70 - 99 mg/dL    Comment 1 Notify RN      Comment 2 Documented in Chart     CBC     Status: Abnormal   Collection Time   11/15/11  4:29 AM      Component Value Range Comment   WBC 12.8 (*) 4.0 - 10.5 K/uL    RBC 2.73 (*) 4.22 - 5.81 MIL/uL    Hemoglobin 7.8 (*) 13.0 - 17.0 g/dL    HCT 23.2 (*) 39.0 - 52.0 %    MCV 85.0  78.0 - 100.0 fL    MCH 28.6  26.0 - 34.0 pg    MCHC 33.6  30.0 - 36.0 g/dL    RDW   16.3 (*) 11.5 - 15.5 %    Platelets 327  150 - 400 K/uL   PROTIME-INR     Status: Abnormal   Collection Time   11/15/11  4:29 AM      Component Value Range Comment   Prothrombin Time 16.5 (*) 11.6 - 15.2 seconds    INR 1.37  0.00 - 1.49   COMPREHENSIVE METABOLIC PANEL     Status: Abnormal   Collection Time   11/15/11  4:29 AM      Component Value Range Comment   Sodium 130 (*) 135 - 145 mEq/L    Potassium 4.7  3.5 - 5.1 mEq/L    Chloride 96  96 - 112 mEq/L    CO2 23  19 - 32 mEq/L    Glucose, Bld 150 (*) 70 - 99 mg/dL    BUN 45 (*) 6 - 23 mg/dL DELTA CHECK NOTED   Creatinine, Ser 5.22 (*) 0.50 - 1.35 mg/dL    Calcium 8.7  8.4 - 10.5 mg/dL    Total Protein 6.0  6.0 - 8.3 g/dL    Albumin 2.1 (*) 3.5 - 5.2 g/dL    AST 35  0 - 37 U/L    ALT 111 (*) 0 - 53 U/L    Alkaline Phosphatase 143 (*) 39 - 117 U/L    Total Bilirubin 1.0  0.3 - 1.2 mg/dL    GFR calc non Af Amer 11 (*) >90 mL/min    GFR calc Af Amer 13 (*) >90 mL/min   HEPARIN LEVEL (UNFRACTIONATED)     Status: Abnormal   Collection Time   11/15/11  4:29 AM      Component Value Range Comment   Heparin Unfractionated <0.10 (*) 0.30 - 0.70 IU/mL   PHOSPHORUS     Status: Abnormal   Collection Time   11/15/11  4:29 AM      Component Value Range Comment   Phosphorus 5.9 (*) 2.3 - 4.6 mg/dL   GLUCOSE, CAPILLARY     Status: Abnormal   Collection Time   11/15/11  5:05 AM       Component Value Range Comment   Glucose-Capillary 119 (*) 70 - 99 mg/dL   TYPE AND SCREEN     Status: Normal   Collection Time   11/15/11  6:00 AM      Component Value Range Comment   ABO/RH(D) A POS      Antibody Screen NEG      Sample Expiration 11/18/2011     GLUCOSE, CAPILLARY     Status: Abnormal   Collection Time   11/15/11  7:42 AM      Component Value Range Comment   Glucose-Capillary 100 (*) 70 - 99 mg/dL    Comment 1 Notify RN     GLUCOSE, CAPILLARY     Status: Abnormal   Collection Time   11/15/11 11:30 AM      Component Value Range Comment   Glucose-Capillary 132 (*) 70 - 99 mg/dL    Comment 1 Notify RN     GLUCOSE, CAPILLARY     Status: Abnormal   Collection Time   11/15/11  4:36 PM      Component Value Range Comment   Glucose-Capillary 126 (*) 70 - 99 mg/dL    Comment 1 Documented in Chart      Comment 2 Notify RN     GLUCOSE, CAPILLARY     Status: Abnormal   Collection Time   11/15/11  8:47   PM      Component Value Range Comment   Glucose-Capillary 119 (*) 70 - 99 mg/dL    Dg Chest Port 1 View  11/15/2011  *RADIOLOGY REPORT*  Clinical Data: Dialysis catheter  PORTABLE CHEST - 1 VIEW  Comparison: 11/14/2011  Findings: Low lung volumes with patchy bibasilar atelectasis. Right upper lobe atelectasis/contusion related to recent prior chest tube.  No pleural effusion or pneumothorax.  Stable cardiomegaly.  Median sternotomy.  Midline skin staples.  Stable right dual lumen dialysis catheter with associated postsurgical changes.  Stable left subclavian venous catheter.  IMPRESSION: Interval removal of right chest tube.  No pneumothorax is seen.  Otherwise unchanged.   Original Report Authenticated By: SRIYESH KRISHNAN, M.D.     Post Admission Physician Evaluation: 1. Functional deficits secondary  to deconditioning after aortic aneurysm repair and mediastinal exploration and multiple medical complications. 2. Patient is admitted to receive collaborative,  interdisciplinary care between the physiatrist, rehab nursing staff, and therapy team. 3. Patient's level of medical complexity and substantial therapy needs in context of that medical necessity cannot be provided at a lesser intensity of care such as a SNF. 4. Patient has experienced substantial functional loss from his/her baseline which was documented above under the "Functional History" and "Functional Status" headings.  Judging by the patient's diagnosis, physical exam, and functional history, the patient has potential for functional progress which will result in measurable gains while on inpatient rehab.  These gains will be of substantial and practical use upon discharge  in facilitating mobility and self-care at the household level. 5. Physiatrist will provide 24 hour management of medical needs as well as oversight of the therapy plan/treatment and provide guidance as appropriate regarding the interaction of the two. 6. 24 hour rehab nursing will assist with bladder management, bowel management, safety, skin/wound care, disease management, medication administration, pain management and patient education  and help integrate therapy concepts, techniques,education, etc. 7. PT will assess and treat for:  fxnl mobility, stamina, strength, balance.  Goals are: mod I to supervision. 8. OT will assess and treat for: UES, ADL's, safety, fxnl mobility, adaptive techniques and equipment.   Goals are: mod I to supervision. 9. SLP will assess and treat for: n/a.  Goals are: n/a. 10. Case Management and Social Worker will assess and treat for psychological issues and discharge planning. 11. Team conference will be held weekly to assess progress toward goals and to determine barriers to discharge. 12. Patient will receive at least 3 hours of therapy per day at least 5 days per week. 13. ELOS: one week      Prognosis:  excellent   Medical Problem List and Plan: 1. Deconditioning following emergency aortic  aneurysm repair and mediastinal exploration 2. 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 Monitor for any bleeding episodes 3. Pain Management: Oxycodone as needed. Monitor with increased mobility 4. Neuropsych: This patient is capable of making decisions on his/her own behalf. 5. Acute renal failure secondary to contrast nephropathy and ATN. Hemodialysis initiated. Ongoing mgt per renal services. Latest creatinine 5.22 6. Hypertension. Cardizem. 90 mg every 12 hours, amiodarone 200 mg twice a day. Monitor with increased activity 7. Postoperative anemia. Latest hemoglobin 7.8. Transfuse as needed. Patient denies any chest pain or increased shortness of breath. ?epo candidate  11/16/2011, Zach Swartz, MD 

## 2011-11-16 NOTE — H&P (Signed)
Physical Medicine and Rehabilitation Admission H&P    Chief Complaint  Patient presents with  . Chest Pain  : [id number] Judie Petit Curenton is a 56 y.o. right-handed male ex smoker as well as history of hypertension admitted 10/29/2011 with chest pain radiating to the neck. Patient was active and independent prior to admission. Noted blood pressure 230/110. EKG revealed right bundle branch block. Chest x-ray showed no acute findings. Cardiac enzymes were negative. Cranial CT scan showed no evidence of acute abnormality.  Patient found to have acute type A. ascending aortic dissection with moderate aortic insufficiency. Underwent emergent repair of ascending aortic dissection 11/01/2011 per Dr. Zenaida Niece trigt. Hospital course with increasing shortness of breath ventilation perfusion scan completed showing intermediate probability for pulmonary embolism. He was placed on low-dose heparin infusion without bolus. Patient became more agitated and developed pulseless electrical activity became hypotensive immediately given CPR and was intubated. Patient underwent emergent sternotomy removing sternal wires/cardiac tamponade 11/04/2011. A chest tube was placed. Patient was extubated 11/10/2011. Developed acute renal failure creatinine increased to 6.22 likely ATN from dye exposure during TAA repair and started on CVVHD. Nephrology continues to follow with permanent dialysis catheter placement and  hemodialysis initiated 11/14/2011. Coumadin has since been initiated for pulmonary emboli and monitoring for any bleeding episodes. He is tolerating a regular consistency diet. Patient received education on sternal precautions. Physical therapy and occupational evaluations completed 11/13/2011 with recommendations of physical medicine rehabilitation consult to consider inpatient rehabilitation services secondary to deconditioning and prolonged intubation. Patient was felt to begin candidate for inpatient rehabilitation services and  was admitted for comprehensive rehabilitation program.   Review of Systems  Cardiovascular: Positive for chest pain and palpitations.  Gastrointestinal: Positive for constipation.  All other systems reviewed and are negative   Past Medical History  Diagnosis Date  . Hypertension   . Hyperglycemia     a. A1C 5.9 in Sept 2013.  Marland Kitchen RBBB    Past Surgical History  Procedure Date  . Knee surgery     LEFT 1981  . Carpal tunnel release     2004 RIGHT  . Wisdom tooth extraction   . Thoracic aortic aneurysm repair 10/30/2011    Procedure: THORACIC ASCENDING ANEURYSM REPAIR (AAA);  Surgeon: Kerin Perna, MD;  Location: [facility] OR;  Service: Open Heart Surgery;  Laterality: N/A;  Ascending aortic dissection repair, arch reconstruction, aortic valve repair  . Insertion of dialysis catheter 11/06/2011    Procedure: INSERTION OF DIALYSIS CATHETER;  Surgeon: Kerin Perna, MD;  Location: [facility] OR;  Service: Thoracic;  Laterality: Right;  . Mediastinal exploration 11/06/2011    Procedure: MEDIASTINAL EXPLORATION;  Surgeon: Kerin Perna, MD;  Location: [facility] OR;  Service: Thoracic;  Laterality: N/A;  sternal closure; removal of wound vac   Family History  Problem Relation Age of Onset  . Parkinsonism Mother   . Leukemia Father     AML at 60  . Colon polyps Brother   . Kidney disease Brother   . Heart disease Brother     CABG at age 82.  . Prostate cancer Neg Hx   . Colon cancer Neg Hx   . Heart disease Father     Angioplasty in his 35s, CABG age 77.   Social History:  reports that he quit smoking about 21 years ago. His smoking use included Cigarettes. He does not have any smokeless tobacco history on file. He reports that he drinks alcohol. He reports that he does not use illicit drugs.  Allergies: No Known Allergies Medications Prior to Admission  Medication Sig Dispense Refill  . amLODipine (NORVASC) 5 MG tablet Take 1 tablet (5 mg total) by mouth daily.  90 tablet  3  . metoprolol  (LOPRESSOR) 100 MG tablet Take 1 tablet (100 mg total) by mouth 2 (two) times daily.  180 tablet  3    Home: Home Living Lives With: Spouse Available Help at Discharge: Family;Available 24 hours/day (mother will stay when wife is unavailable) Type of Home: House Home Access: Stairs to enter [employer] of Steps: 4 Entrance Stairs-Rails: Right Home Layout: One level Bathroom Shower/Tub: Engineer, manufacturing systems: Standard Bathroom Accessibility: Yes How Accessible: Accessible via walker Home Adaptive Equipment: None   Functional History: Prior Function Able to Take Stairs?: Reciprically Driving: Yes Vocation: Full time employment  Functional Status:  Mobility: Bed Mobility Bed Mobility: Not assessed Supine to Sit: 3: Mod assist;HOB elevated Sitting - Scoot to Edge of Bed: 4: Min assist Sit to Supine: 3: Mod assist;HOB elevated Transfers Transfers: Sit to Stand;Stand to Sit Sit to Stand: 4: Min assist;Without upper extremity assist;From chair/3-in-1 Sit to Stand: Patient Percentage: 70% Stand to Sit: 4: Min assist;Without upper extremity assist;To chair/3-in-1 Stand to Sit: Patient Percentage: 80% Ambulation/Gait Ambulation/Gait Assistance: 1: +2 Total assist Ambulation/Gait: Patient Percentage: 80% Ambulation Distance (Feet): 100 Feet Assistive device: Rolling walker Ambulation/Gait Assistance Details: Assist for balance with cues for tall posture and safety inside RW.  Gait Pattern: Step-through pattern;Decreased stride length;Trunk flexed Stairs: No Wheelchair Mobility Wheelchair Mobility: No  ADL: ADL Eating/Feeding: Simulated;Set up (using Lt. UE) Where Assessed - Eating/Feeding: Bed level Grooming: Simulated;Wash/dry hands;Wash/dry face;Supervision/safety Where Assessed - Grooming: Unsupported sitting Upper Body Bathing: Simulated;Moderate assistance Where Assessed - Upper Body Bathing: Supported sitting Lower Body Bathing: Simulated;+1  Total assistance Where Assessed - Lower Body Bathing: Supported sit to stand Upper Body Dressing: Performed;Maximal assistance Where Assessed - Upper Body Dressing: Unsupported sitting Lower Body Dressing: Simulated;+1 Total assistance Where Assessed - Lower Body Dressing: Supported sit to stand Toilet Transfer: Simulated;+2 Total assistance Toilet Transfer Method: Sit to stand;Stand pivot Acupuncturist: Other (comment) (ambulation and transfer to recliner) Equipment Used: Other (comment) (Pt. pushed wheelchair) Transfers/Ambulation Related to ADLs: Pt. ambulated ~40' with total A +2 (pt ~75%).  Pt. fatigues and becomes somewhat unsteady ADL Comments: Pt. fatigues rapidly.  Requires mod - total A for BADLs except grooming and self feeding due to fatigue/endurance issues.  Pt. with weakness Rt. hand and unable to use Rt. hand for self care activities  Cognition: Cognition Arousal/Alertness: Awake/alert Orientation Level: Oriented X4 Cognition Overall Cognitive Status: Appears within functional limits for tasks assessed/performed Arousal/Alertness: Awake/alert Orientation Level: Oriented X4 / Intact Behavior During Session: [facility] for tasks performed Cognition - Other Comments: Pt. recalls information/occurances through the day.  Mildly delayed processing   Blood pressure 154/73, pulse 89, temperature 97.7 F (36.5 C), temperature source Oral, resp. rate 20, height 5\' 8"  (1.727 m), weight 104.4 kg (230 lb 2.6 oz), SpO2 97.00%. Physical Exam  Vitals reviewed.  Constitutional: He is oriented to person, place, and time. He appears well-developed.  HENT: mucosa pink and moist Head: Normocephalic.  Eyes:  Pupils round and reactive to light  Neck: Neck supple. No thyromegaly present.  Cardiovascular: Normal rate and regular rhythm.  Pulmonary/Chest: Breath sounds normal. He has no wheezes.  Abdominal: Soft. Bowel sounds are normal. He exhibits no distension.  Neurological: He  is alert and oriented to person, place, and time. resasonable memory, insight,  and awareness.  Follows three-step commands. Strength grossly 4/5 in the upper ext and 3-4 in the lower ext's Skin:  Chest tube out. Area clean and dressed. Wounds all clean with minimal drainage Psychiatric: He has a normal mood and affect.  Reduced sensation right second third digits seems improved.    Results for orders placed during the hospital encounter of 10/29/11 (from the past 48 hour(s))  GLUCOSE, CAPILLARY     Status: Abnormal   Collection Time   11/14/11  7:55 AM      Component Value Range Comment   Glucose-Capillary 109 (*) 70 - 99 mg/dL   GLUCOSE, CAPILLARY     Status: Abnormal   Collection Time   11/14/11 11:36 AM      Component Value Range Comment   Glucose-Capillary 101 (*) 70 - 99 mg/dL   PROTIME-INR     Status: Abnormal   Collection Time   11/14/11  2:24 PM      Component Value Range Comment   Prothrombin Time 16.2 (*) 11.6 - 15.2 seconds    INR 1.33  0.00 - 1.49   CBC     Status: Abnormal   Collection Time   11/14/11  2:24 PM      Component Value Range Comment   WBC 15.6 (*) 4.0 - 10.5 K/uL    RBC 2.80 (*) 4.22 - 5.81 MIL/uL    Hemoglobin 8.1 (*) 13.0 - 17.0 g/dL    HCT 96.0 (*) 45.4 - 52.0 %    MCV 85.4  78.0 - 100.0 fL    MCH 28.9  26.0 - 34.0 pg    MCHC 33.9  30.0 - 36.0 g/dL    RDW 09.8 (*) 11.9 - 15.5 %    Platelets 351  150 - 400 K/uL   HEPARIN LEVEL (UNFRACTIONATED)     Status: Abnormal   Collection Time   11/14/11  2:24 PM      Component Value Range Comment   Heparin Unfractionated <0.10 (*) 0.30 - 0.70 IU/mL   GLUCOSE, CAPILLARY     Status: Abnormal   Collection Time   11/14/11  3:39 PM      Component Value Range Comment   Glucose-Capillary 112 (*) 70 - 99 mg/dL   GLUCOSE, CAPILLARY     Status: Abnormal   Collection Time   11/14/11  7:26 PM      Component Value Range Comment   Glucose-Capillary 150 (*) 70 - 99 mg/dL    Comment 1 Notify RN      Comment 2 Documented  in Chart     GLUCOSE, CAPILLARY     Status: Normal   Collection Time   11/14/11 11:33 PM      Component Value Range Comment   Glucose-Capillary 82  70 - 99 mg/dL    Comment 1 Notify RN      Comment 2 Documented in Chart     CBC     Status: Abnormal   Collection Time   11/15/11  4:29 AM      Component Value Range Comment   WBC 12.8 (*) 4.0 - 10.5 K/uL    RBC 2.73 (*) 4.22 - 5.81 MIL/uL    Hemoglobin 7.8 (*) 13.0 - 17.0 g/dL    HCT 14.7 (*) 82.9 - 52.0 %    MCV 85.0  78.0 - 100.0 fL    MCH 28.6  26.0 - 34.0 pg    MCHC 33.6  30.0 - 36.0 g/dL    RDW  16.3 (*) 11.5 - 15.5 %    Platelets 327  150 - 400 K/uL   PROTIME-INR     Status: Abnormal   Collection Time   11/15/11  4:29 AM      Component Value Range Comment   Prothrombin Time 16.5 (*) 11.6 - 15.2 seconds    INR 1.37  0.00 - 1.49   COMPREHENSIVE METABOLIC PANEL     Status: Abnormal   Collection Time   11/15/11  4:29 AM      Component Value Range Comment   Sodium 130 (*) 135 - 145 mEq/L    Potassium 4.7  3.5 - 5.1 mEq/L    Chloride 96  96 - 112 mEq/L    CO2 23  19 - 32 mEq/L    Glucose, Bld 150 (*) 70 - 99 mg/dL    BUN 45 (*) 6 - 23 mg/dL DELTA CHECK NOTED   Creatinine, Ser 5.22 (*) 0.50 - 1.35 mg/dL    Calcium 8.7  8.4 - 16.1 mg/dL    Total Protein 6.0  6.0 - 8.3 g/dL    Albumin 2.1 (*) 3.5 - 5.2 g/dL    AST 35  0 - 37 U/L    ALT 111 (*) 0 - 53 U/L    Alkaline Phosphatase 143 (*) 39 - 117 U/L    Total Bilirubin 1.0  0.3 - 1.2 mg/dL    GFR calc non Af Amer 11 (*) >90 mL/min    GFR calc Af Amer 13 (*) >90 mL/min   HEPARIN LEVEL (UNFRACTIONATED)     Status: Abnormal   Collection Time   11/15/11  4:29 AM      Component Value Range Comment   Heparin Unfractionated <0.10 (*) 0.30 - 0.70 IU/mL   PHOSPHORUS     Status: Abnormal   Collection Time   11/15/11  4:29 AM      Component Value Range Comment   Phosphorus 5.9 (*) 2.3 - 4.6 mg/dL   GLUCOSE, CAPILLARY     Status: Abnormal   Collection Time   11/15/11  5:05 AM       Component Value Range Comment   Glucose-Capillary 119 (*) 70 - 99 mg/dL   TYPE AND SCREEN     Status: Normal   Collection Time   11/15/11  6:00 AM      Component Value Range Comment   ABO/RH(D) A POS      Antibody Screen NEG      Sample Expiration 11/18/2011     GLUCOSE, CAPILLARY     Status: Abnormal   Collection Time   11/15/11  7:42 AM      Component Value Range Comment   Glucose-Capillary 100 (*) 70 - 99 mg/dL    Comment 1 Notify RN     GLUCOSE, CAPILLARY     Status: Abnormal   Collection Time   11/15/11 11:30 AM      Component Value Range Comment   Glucose-Capillary 132 (*) 70 - 99 mg/dL    Comment 1 Notify RN     GLUCOSE, CAPILLARY     Status: Abnormal   Collection Time   11/15/11  4:36 PM      Component Value Range Comment   Glucose-Capillary 126 (*) 70 - 99 mg/dL    Comment 1 Documented in Chart      Comment 2 Notify RN     GLUCOSE, CAPILLARY     Status: Abnormal   Collection Time   11/15/11  8:47  PM      Component Value Range Comment   Glucose-Capillary 119 (*) 70 - 99 mg/dL    Dg Chest Port 1 View  11/15/2011  *RADIOLOGY REPORT*  Clinical Data: Dialysis catheter  PORTABLE CHEST - 1 VIEW  Comparison: 11/14/2011  Findings: Low lung volumes with patchy bibasilar atelectasis. Right upper lobe atelectasis/contusion related to recent prior chest tube.  No pleural effusion or pneumothorax.  Stable cardiomegaly.  Median sternotomy.  Midline skin staples.  Stable right dual lumen dialysis catheter with associated postsurgical changes.  Stable left subclavian venous catheter.  IMPRESSION: Interval removal of right chest tube.  No pneumothorax is seen.  Otherwise unchanged.   Original Report Authenticated By: Charline Bills, M.D.     Post Admission Physician Evaluation: 1. Functional deficits secondary  to deconditioning after aortic aneurysm repair and mediastinal exploration and multiple medical complications. 2. Patient is admitted to receive collaborative,  interdisciplinary care between the physiatrist, rehab nursing staff, and therapy team. 3. Patient's level of medical complexity and substantial therapy needs in context of that medical necessity cannot be provided at a lesser intensity of care such as a SNF. 4. Patient has experienced substantial functional loss from his/her baseline which was documented above under the "Functional History" and "Functional Status" headings.  Judging by the patient's diagnosis, physical exam, and functional history, the patient has potential for functional progress which will result in measurable gains while on inpatient rehab.  These gains will be of substantial and practical use upon discharge  in facilitating mobility and self-care at the household level. 5. Physiatrist will provide 24 hour management of medical needs as well as oversight of the therapy plan/treatment and provide guidance as appropriate regarding the interaction of the two. 6. 24 hour rehab nursing will assist with bladder management, bowel management, safety, skin/wound care, disease management, medication administration, pain management and patient education  and help integrate therapy concepts, techniques,education, etc. 7. PT will assess and treat for:  fxnl mobility, stamina, strength, balance.  Goals are: mod I to supervision. 8. OT will assess and treat for: UES, ADL's, safety, fxnl mobility, adaptive techniques and equipment.   Goals are: mod I to supervision. 9. SLP will assess and treat for: n/a.  Goals are: n/a. 10. Case Management and Social Worker will assess and treat for psychological issues and discharge planning. 11. Team conference will be held weekly to assess progress toward goals and to determine barriers to discharge. 12. Patient will receive at least 3 hours of therapy per day at least 5 days per week. 13. ELOS: one week      Prognosis:  excellent   Medical Problem List and Plan: 1. Deconditioning following emergency aortic  aneurysm repair and mediastinal exploration 2. 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 Monitor for any bleeding episodes 3. Pain Management: Oxycodone as needed. Monitor with increased mobility 4. Neuropsych: This patient is capable of making decisions on his/her own behalf. 5. Acute renal failure secondary to contrast nephropathy and ATN. Hemodialysis initiated. Ongoing mgt per renal services. Latest creatinine 5.22 6. Hypertension. Cardizem. 90 mg every 12 hours, amiodarone 200 mg twice a day. Monitor with increased activity 7. Postoperative anemia. Latest hemoglobin 7.8. Transfuse as needed. Patient denies any chest pain or increased shortness of breath. ?epo candidate  11/16/2011, Ivory Broad, MD

## 2011-11-16 NOTE — Progress Notes (Signed)
ANTICOAGULATION CONSULT NOTE  Pharmacy Consult for Heparin/coumadin overlap  Indication: Pulmonary embolus   No Known Allergies  Patient Measurements: Weight: 222 lb 0.1 oz (100.7 kg) Heparin Dosing Weight: 89.9  Vital Signs: Temp: 98.4 F (36.9 C) (10/11 1750) Temp src: Oral (10/11 1750) BP: 178/86 mmHg (10/11 1750) Pulse Rate: 97  (10/11 1750)  Labs:  Basename 11/16/11 0500 11/15/11 0429 11/14/11 1424 11/14/11 0439  HGB 7.4* 7.8* -- --  HCT 22.5* 23.2* 23.9* --  PLT 352 327 351 --  APTT -- -- -- --  LABPROT 20.5* 16.5* 16.2* --  INR 1.83* 1.37 1.33 --  HEPARINUNFRC >2.00* <0.10* <0.10* --  CREATININE 7.75* 5.22* -- 6.77*  CKTOTAL -- -- -- --  CKMB -- -- -- --  TROPONINI -- -- -- --    The CrCl is unknown because both a height and weight (above a minimum accepted value) are required for this calculation.  Assessment: 56 year old male s/p repair AAA 9/24, complicated post-op course including PE 9/28. Patient underwent emergent sternotomy removing sternal wires/cardiac tamponade 11/04/2011. A chest tube was placed. Patient was extubated 11/10/2011. Developed acute renal failure creatinine increased to 6.22 likely ATN from dye exposure during TAA repair and started on CVVHD.   Heparin drip initiated on 11/03/11 then switched to Bivalirudin on 9/29 due to rule-out HIT.  HIT panel negative thus switched back to IV heparin on 10/8.  Heparin level remained subtherapeutic on 1500 units/hr thus pharmacist ordered to increase rate to 1800 units/hr on 10/9. However cardiothoracic surgeon continued dosing heparin at lower rate of 1500 units/hr.  Heparin levels were 0.2, <0.1, <0.1 (10/8, 10/9, 10/10) then this AM heparin level reported as >2 on same rate of 1500 units/hr. PLTC = 342K.  HGB 7.4. Lab notes indicate today's AM heparin level "questionable results" and recommended repeat level. Dr. Donata Clay discontinued the heparin drip this AM.   Today is ovelap day #5 of heparin/coumadin but  INR 1.83 is subtherapeutic.  Heparin overlap needs to continue until INR =/>2  for >24 hours for acute PE. As discussed with Dr. Riley Kill we will resume IV heparin infusion.  Patient has received his coumadin 4mg  dose this morning.       Goal of Therapy:  INR 2.0 - 2.5  Per Dr. Donata Clay (note on 11/16/11) 0.3-0.5 per Dr. Donata Clay (Monitor platelets by anticoagulation protocol: Yes   Plan:  Restart IV Heparin drip 1500 units/hr.  Check heparin level in 8 hours.  Continue coumadin 4mg  po daily.  PT/INR, CBC, heparin level qAM.  Noah Delaine, RPh Clinical Pharmacist Pager: [phone] 11/16/2011 7:16 PM

## 2011-11-16 NOTE — Progress Notes (Signed)
To CIR today. Ed completed with wife present. Requests his name be sent to [facility] for in the future after CIR. [id number] Ethelda Chick CES, ACSM

## 2011-11-16 NOTE — Progress Notes (Addendum)
                    [address]            [place] [address]          [phone]     3 Days Post-Op Procedure(s) (LRB): INSERTION OF DIALYSIS CATHETER (N/A)  Subjective: Seen on HD.  Stable, no new complaints.  R hand still weak. Appetite good, +BM.  Breathing stable.   Objective: Vital signs in last 24 hours: Patient Vitals for the past 24 hrs:  BP Temp Temp src Pulse Resp SpO2 Weight  11/16/11 0900 153/75 mmHg - - 81  24  94 % -  11/16/11 0830 143/80 mmHg - - 93  23  96 % -  11/16/11 0800 127/85 mmHg - - 81  20  96 % -  11/16/11 0730 140/74 mmHg - - 81  20  96 % -  11/16/11 0719 150/73 mmHg - - 78  18  96 % -  11/16/11 0714 145/94 mmHg - - 89  20  96 % -  11/16/11 0700 146/80 mmHg 98.8 F (37.1 C) Oral 84  23  95 % 236 lb 1.8 oz (107.1 kg)  11/16/11 0601 154/73 mmHg 97.7 F (36.5 C) Oral 89  20  97 % 230 lb 2.6 oz (104.4 kg)  11/15/11 2100 - - - - - 96 % -  11/15/11 2036 152/78 mmHg 98.2 F (36.8 C) Oral 80  20  95 % -  11/15/11 1400 141/70 mmHg 98 F (36.7 C) Oral 75  18  97 % -  11/15/11 1219 150/73 mmHg 98.1 F (36.7 C) Oral 86  18  98 % -  11/15/11 1131 - 98.7 F (37.1 C) Oral - - - -  11/15/11 1000 138/74 mmHg - - - - 96 % -   Current Weight  11/16/11 236 lb 1.8 oz (107.1 kg)     Intake/Output from previous day: 10/10 0701 - 10/11 0700 In: 450 [P.O.:390; I.V.:60] Out: -   CBGs 126-119-162-94  PHYSICAL EXAM:  Heart: RRR Lungs: decreased BS in bases, overall clear Wound: Clean and dry Extremities: No significant LE edema    Lab Results: CBC: Basename 11/16/11 0500 11/15/11 0429  WBC 11.6* 12.8*  HGB 7.4* 7.8*  HCT 22.5* 23.2*  PLT 352 327   BMET:  Basename 11/16/11 0500 11/15/11 0429  NA 126* 130*  K 4.9 4.7  CL 88* 96  CO2 20 23  GLUCOSE 162* 150*  BUN 68* 45*  CREATININE 7.75* 5.22*  CALCIUM 8.9 8.7    PT/INR:  Basename 11/16/11 0500  LABPROT 20.5*  INR 1.83*      Assessment/Plan: S/P Procedure(s)  (LRB): INSERTION OF DIALYSIS CATHETER (N/A) ARF- continue HD per renal. CV- SR, BPs somewhat elevated. Continue current meds for now. May need to consider restarting home Norvasc, Lopressor. Anemia- stable, watch.  PE- Continue Coumadin. Deconditioning- PT/OT. Disp- for CIR when medically stable.    LOS: 18 days    COLLINS,GINA H 11/16/2011   Ready for transfer to CIR unit Continue daily Coumadin with goal INR 2.0-2.54 postop V/Q scan showing significant risk of right lung PE. Postoperative A. fib now converted to sinus on amiodarone, oral Cardizem and low-dose Lopressor Surgical incisions healing Leave sternal staples in until October 15 been removed alternate staples

## 2011-11-16 NOTE — Progress Notes (Signed)
PT Cancellation Note  Patient Details Name: NAHEEM MOSCO MRN: [id number] DOB: 04/02/1955   Cancelled Treatment:    Reason Eval/Treat Not Completed: Patient at procedure or test/unavailable (Pt currently at HD.)   Cephus Shelling 11/16/2011, 11:08 AM  11/16/2011 Cephus Shelling, PT, DPT [phone]

## 2011-11-16 NOTE — PMR Pre-admission (Signed)
PMR Admission Coordinator Pre-Admission Assessment  Patient: Gabriel Tran is an 56 y.o., male MRN: [id number] DOB: [birth year] Height: 5\' 8"  (172.7 cm) Weight: 103.7 kg (228 lb 9.9 oz)  Insurance Information HMO:     PPO: yes     PCP:      IPA:      80/20:      OTHER:  PRIMARY: [facility]/[employer] Lucille Passy      Policy#: [id number]      Subscriber: pt CM Name: none needed   NO precert required per 3 requests 10/9 [id number]   10/10 [id number] 10/11 [id number] [phone]  Pre-Cert#:   None required for the state of [place]   Employer: self employed Benefits:  Phone #: [phone]     Name: 11/14/11 Elease Hashimoto. Date: 08/06/11 active     Deduct: $7500      Out of Pocket Max: $10,000      Life Max: f million CIR: $500 copay per admit then 80%      SNF: $100/day with $500 copay per admit Outpatient: 80%     Co-Pay: 20% 30 visits each discipline Home Health: 80%      Co-Pay: 20% 21 visits max DME: 80%     Co-Pay: 20% Providers: in network  SECONDARY: none       Medicaid Application Date:       Case Manager:  Disability Application Date:       Case Worker:   Emergency Contact Information Contact Information    Name Relation Home Work Mobile   Trampe,Cindy Spouse [phone]       Current Medical History   Patient Admitting Diagnosis: Deconditioning following 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 right-handed male ex smoker as well as history of hypertension admitted 10/29/2011 with chest pain radiating to the neck. Noted blood pressure 230/110.   Patient found to have acute type A. ascending aortic dissection with moderate aortic insufficiency. Underwent emergent repair of ascending aortic dissection 11/01/2011 per Dr. Zenaida Niece trigt. Hospital course with increasing shortness of breath ventilation perfusion scan completed showing intermediate probability for pulmonary embolism. He was  placed on low-dose heparin infusion without bolus. Patient became more agitated and developed pulseless electrical activity became hypotensive immediately given CPR and was intubated. Patient underwent emergent sternotomy removing sternal wires/cardiac tamponade 11/04/2011.  Patient was extubated 11/10/2011. Developed acute renal failure creatinine increased to 3.09 likely ATN from dye exposure during TAA repair and started on CVVHD. Nephrology continues to follow and placed  permanent dialysis catheter on 11/13/11 and  hemodialysis began 11/14/2011. Coumadin has since been initiated for pulmonary emboli and monitoring for any bleeding episodes.  Past Medical History  Past Medical History  Diagnosis Date  . Hypertension   . Hyperglycemia     a. A1C 5.9 in Sept 2013.  Marland Kitchen RBBB     Family History  family history includes Colon polyps in his brother; Heart disease in his brother and father; Kidney disease in his brother; Leukemia in his father; and Parkinsonism in his mother.  There is no history of Prostate cancer and Colon cancer.  Prior Rehab/Hospitalizations: none   Current Medications  Current facility-administered medications:0.9 %  sodium chloride infusion, , Intravenous, Continuous, Erin Barrett, PA, Last Rate: 20 mL/hr at 11/13/11 2200;  0.9 %  sodium chloride infusion, 250 mL, Intravenous, PRN, Kerin Perna, MD;  amiodarone (PACERONE) tablet 200 mg, 200 mg, Oral, BID, Theron Arista  Donata Clay, MD, 200 mg at 11/16/11 1232;  bisacodyl (DULCOLAX) EC tablet 10 mg, 10 mg, Oral, Daily PRN, Kerin Perna, MD bisacodyl (DULCOLAX) EC tablet 10 mg, 10 mg, Oral, Daily PRN, Kerin Perna, MD;  bisacodyl (DULCOLAX) suppository 10 mg, 10 mg, Rectal, Daily PRN, Kerin Perna, MD;  calcium acetate (PHOSLO) capsule 1,334 mg, 1,334 mg, Oral, TID WC, Dyke Maes, MD, 1,334 mg at 11/16/11 1233;  cloNIDine (CATAPRES) tablet 0.1 mg, 0.1 mg, Oral, Q4H PRN, Dyke Maes, MD diltiazem (CARDIZEM SR) 12  hr capsule 90 mg, 90 mg, Oral, Q12H, Kerin Perna, MD, 90 mg at 11/16/11 1228;  docusate sodium (COLACE) capsule 200 mg, 200 mg, Oral, Daily, Kerin Perna, MD, 200 mg at 11/16/11 1233;  feeding supplement (NEPRO CARB STEADY) liquid 237 mL, 237 mL, Oral, Q1500, Ailene Ards, RD;  feeding supplement (PRO-STAT SUGAR FREE 64) liquid 30 mL, 30 mL, Oral, Q1200, Ailene Ards, RD insulin aspart (novoLOG) injection 0-24 Units, 0-24 Units, Subcutaneous, TID AC & HS, Kerin Perna, MD, 1 Units at 11/15/11 1720;  levalbuterol [facility]) nebulizer solution 0.63 mg, 0.63 mg, Nebulization, Q4H PRN, Kerin Perna, MD, 0.63 mg at 11/15/11 2100;  magnesium hydroxide (MILK OF MAGNESIA) suspension 30 mL, 30 mL, Oral, Daily PRN, Kerin Perna, MD;  moving right along book, , Does not apply, Once, Kerin Perna, MD multivitamin (RENA-VIT) tablet 1 tablet, 1 tablet, Oral, Daily, Dyke Maes, MD, 1 tablet at 11/16/11 1228;  ondansetron [facility]) injection 4 mg, 4 mg, Intravenous, Q6H PRN, Ardelle Balls, PA, 4 mg at 11/09/11 1317;  oxyCODONE (Oxy IR/ROXICODONE) 5 MG immediate release tablet, , , , , 10 mg at 11/16/11 0724;  oxyCODONE (Oxy IR/ROXICODONE) 5 MG immediate release tablet, , , , , 10 mg at 11/16/11 1040 oxyCODONE (Oxy IR/ROXICODONE) immediate release tablet 5-10 mg, 5-10 mg, Oral, Q3H PRN, Delight Ovens, MD, 10 mg at 11/15/11 0649;  pantoprazole (PROTONIX) EC tablet 40 mg, 40 mg, Oral, Q1200, Delight Ovens, MD, 40 mg at 11/16/11 1232;  phenol (CHLORASEPTIC) mouth spray 1 spray, 1 spray, Mouth/Throat, PRN, Delight Ovens, MD, 1 spray at 11/10/11 1926 sodium chloride 0.9 % injection 10-40 mL, 10-40 mL, Intracatheter, Q12H, Kerin Perna, MD, 10 mL at 11/15/11 1000;  sodium chloride 0.9 % injection 10-40 mL, 10-40 mL, Intracatheter, PRN, Kerin Perna, MD, 10 mL at 11/04/11 1000;  sodium chloride 0.9 % injection 3 mL, 3 mL, Intravenous, Q12H, Kerin Perna, MD;   sodium chloride 0.9 % injection 3 mL, 3 mL, Intravenous, PRN, Kerin Perna, MD temazepam (RESTORIL) capsule 15 mg, 15 mg, Oral, QHS PRN, Kerin Perna, MD, 15 mg at 11/15/11 2046;  warfarin (COUMADIN) tablet 4 mg, 4 mg, Oral, Daily, Kerin Perna, MD, 4 mg at 11/16/11 1233;  Warfarin - Physician Dosing Inpatient, , Does not apply, q1800, Kerin Perna, MD;  DISCONTD: feeding supplement (NEPRO CARB STEADY) liquid 237 mL, 237 mL, Oral, TID BM, Ailene Ards, RD, 237 mL at 11/15/11 2003 DISCONTD: feeding supplement (PRO-STAT SUGAR FREE 64) liquid 30 mL, 30 mL, Oral, BID WC, Ailene Ards, RD, 30 mL at 11/16/11 1234;  DISCONTD: heparin ADULT infusion 100 units/mL (25000 units/250 mL), 1,500 Units/hr, Intravenous, Continuous, Kerin Perna, MD, Last Rate: 15 mL/hr at 11/15/11 2007, 1,500 Units/hr at 11/15/11 2007 DISCONTD: metoprolol (LOPRESSOR) injection 2.5-5 mg, 2.5-5 mg, Intravenous, Q2H PRN, Erin Barrett, PA, 2.5 mg at  11/10/11 1827  Patients Current Diet: Renal  Precautions / Restrictions Precautions Precautions: Sternal;Fall Precaution Comments: Re-educated on sternal precautions. 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 recalls information/occurances through the day.  Mildly delayed processing    Extremity Assessment (includes Sensation/Coordination)  RUE ROM/Strength/Tone: Deficits RUE  ROM/Strength/Tone Deficits: Shoulder strength 2/5 - 2-/5; elbow, wrist, forearm grossly 3+/5; Grip 1+/5.  Finger extension AROM WFL RUE Sensation: Deficits RUE Sensation Deficits: Pt. reports numbness Rt. hand RUE Coordination: Deficits RUE Coordination Deficits: Pt. unable to grasp items in Rt. hand  RLE ROM/Strength/Tone: Deficits RLE ROM/Strength/Tone Deficits: grossly 3/5    ADLs  Eating/Feeding: Simulated;Set up (using Lt. UE) Where Assessed - Eating/Feeding: Bed level Grooming: Simulated;Wash/dry hands;Wash/dry face;Supervision/safety Where Assessed - Grooming: Unsupported sitting Upper Body Bathing: Simulated;Moderate assistance Where Assessed - Upper Body Bathing: Supported sitting Lower Body Bathing: Simulated;+1 Total assistance Where Assessed - Lower Body Bathing: Supported sit to stand Upper Body Dressing: Performed;Maximal assistance Where Assessed - Upper Body Dressing: Unsupported sitting Lower Body Dressing: Simulated;+1 Total assistance Where Assessed - Lower Body Dressing: Supported sit to stand Toilet Transfer: Simulated;+2 Total assistance Toilet Transfer: Patient Percentage: 70% Statistician Method: Sit to stand;Stand pivot Acupuncturist: Other (comment) (ambulation and transfer to recliner) Toileting - Clothing Manipulation and Hygiene: Simulated;Maximal assistance Where Assessed - Engineer, mining and Hygiene: Standing Equipment Used: Other (comment) (Pt. pushed wheelchair) Transfers/Ambulation Related to ADLs: Pt. ambulated ~40' with total A +2 (pt ~75%).  Pt. fatigues and becomes somewhat unsteady ADL Comments: Pt. fatigues rapidly.  Requires mod - total A for BADLs except grooming and self feeding due to fatigue/endurance issues.  Pt. with weakness Rt. hand and unable to use Rt. hand for self care activities    Mobility  Bed Mobility: Not assessed Supine to Sit: 3: Mod assist;HOB elevated Sitting - Scoot to Edge of Bed: 4: Min  assist Sit to Supine: 3: Mod assist;HOB elevated    Transfers  Transfers: Sit to Stand;Stand to Sit Sit to Stand: 4: Min assist;Without upper extremity assist;From chair/3-in-1 Sit to Stand: Patient Percentage: 70% Stand to Sit: 4: Min assist;Without upper extremity assist;To chair/3-in-1 Stand to Sit: Patient Percentage: 80%    Ambulation / Gait / Stairs / Psychologist, prison and probation services  Ambulation/Gait Ambulation/Gait Assistance: 1: +2 Total assist Ambulation/Gait: Patient Percentage: 80% Ambulation Distance (Feet): 100 Feet Assistive device: Rolling walker Ambulation/Gait Assistance Details: Assist for balance with cues for tall posture and safety inside RW.  Gait Pattern: Step-through pattern;Decreased stride length;Trunk flexed Stairs: No Wheelchair Mobility Wheelchair Mobility: No    Posture / Holiday representative Standing - Balance Support: Bilateral upper extremity supported Static Standing - Level of Assistance: 1: +2 Total assist     Previous Home Environment Living Arrangements: Spouse/significant other Lives With: Spouse Available Help at Discharge: Family;Available 24 hours/day (mother will stay when wife is unavailable) Type of Home: House Home Layout: One level Home Access: Stairs to enter Entrance Stairs-Rails: Right Entrance Stairs-Number of Steps: 4 Bathroom Shower/Tub: Engineer, manufacturing systems: Standard Bathroom Accessibility: Yes How Accessible: Accessible via walker Home Care  Services: No  Discharge Living Setting Plans for Discharge Living Setting: Patient's home;Lives with (comment) (wife) Type of Home at Discharge: House Discharge Home Layout: One level Discharge Home Access: Stairs to enter Entrance Stairs-Rails: Right Entrance Stairs-Number of Steps: 4 steps Discharge Bathroom Shower/Tub: Tub/shower unit Discharge Bathroom Toilet: Standard Discharge Bathroom Accessibility: Yes How Accessible: Accessible via walker Do you  have any problems obtaining your medications?: No  Social/Family/Support Systems Patient Roles: Spouse;Other (Comment) (employee) Contact Information: Karlis Courtenay, wife Anticipated Caregiver: wife and pt's mother Anticipated Caregiver's Contact Information: [phone] Ability/Limitations of Caregiver: works days, can take some time off, but is coordinating that with a coworker also  Caregiver Availability: 24/7 (initally for a week or two but wants to return to work by 11) Discharge Plan Discussed with Primary Caregiver: Yes Is Caregiver In Agreement with Plan?: Yes Does Caregiver/Family have Issues with Lodging/Transportation while Pt is in Rehab?: No  Goals/Additional Needs Patient/Family Goal for Rehab: supervision PT, supervision to min OT Expected length of stay: ELOS 7 to 10 days Special Service Needs: Patient ARF currently in hemodialysis. Renal recovery for being dialysis dependent once discharged still undetermined. I have discussed this issue with Dr. Riley Kill as well as Dr. Briant Cedar. Briant Cedar initated the "clipping" process with [facility] on 10/11 in case outpatient dialysis needed after d/c. patient is aware. Additional Information: [facility] Intake central admission [phone] Pt/Family Agrees to Admission and willing to participate: Yes Program Orientation Provided & Reviewed with Pt/Caregiver Including Roles  & Responsibilities: Yes  Patient Condition: Please see physician update to information in consult dated 11/13/11.  Preadmission Screen Completed By:  Clois Dupes, 11/16/2011 12:42 PM ______________________________________________________________________   Discussed status with Dr. Riley Kill on 11/16/11 at  1241 and received telephone approval for admission today.  Admission Coordinator:  Clois Dupes, time 3244 Date 11/16/11.

## 2011-11-16 NOTE — Procedures (Signed)
Pt seen on HD  AP 130 Vp 130  BFR much improved at 400.  SBP 150.  Pt tolerating HD well.

## 2011-11-16 NOTE — Progress Notes (Addendum)
I am coordinating with [facility] timing of inpt rehab admission as well as plans for outpatient dialysis if needed. Discussed with Dr. Briant Cedar to begin the "clipping" process for outpatient dialysis. I will discuss with Dr. Riley Kill and then Coral Ceo. [phone] I can admit patient to inpt rehab today. Patient is in agreement. I have discussed with RN CM to assist  In coordination. [phone]

## 2011-11-17 ENCOUNTER — Encounter (HOSPITAL_COMMUNITY): Payer: Self-pay | Admitting: Rehabilitation

## 2011-11-17 ENCOUNTER — Inpatient Hospital Stay (HOSPITAL_COMMUNITY): Payer: PRIVATE HEALTH INSURANCE | Admitting: *Deleted

## 2011-11-17 ENCOUNTER — Inpatient Hospital Stay (HOSPITAL_COMMUNITY): Payer: PRIVATE HEALTH INSURANCE | Admitting: Physical Therapy

## 2011-11-17 DIAGNOSIS — R5381 Other malaise: Secondary | ICD-10-CM

## 2011-11-17 DIAGNOSIS — J9600 Acute respiratory failure, unspecified whether with hypoxia or hypercapnia: Secondary | ICD-10-CM

## 2011-11-17 DIAGNOSIS — Z5189 Encounter for other specified aftercare: Secondary | ICD-10-CM

## 2011-11-17 DIAGNOSIS — N179 Acute kidney failure, unspecified: Secondary | ICD-10-CM

## 2011-11-17 LAB — TYPE AND SCREEN
ABO/RH(D): A POS
Antibody Screen: NEGATIVE
Unit division: 0

## 2011-11-17 LAB — PROTIME-INR: INR: 1.4 (ref 0.00–1.49)

## 2011-11-17 LAB — CBC
MCH: 28.7 pg (ref 26.0–34.0)
MCV: 86 fL (ref 78.0–100.0)
Platelets: 304 10*3/uL (ref 150–400)
RDW: 16 % — ABNORMAL HIGH (ref 11.5–15.5)
WBC: 10.3 10*3/uL (ref 4.0–10.5)

## 2011-11-17 LAB — GLUCOSE, CAPILLARY
Glucose-Capillary: 104 mg/dL — ABNORMAL HIGH (ref 70–99)
Glucose-Capillary: 105 mg/dL — ABNORMAL HIGH (ref 70–99)

## 2011-11-17 LAB — HEPATITIS B CORE ANTIBODY, TOTAL: Hep B Core Total Ab: NEGATIVE

## 2011-11-17 LAB — HEPARIN LEVEL (UNFRACTIONATED): Heparin Unfractionated: <0.1 IU/mL — ABNORMAL LOW (ref 0.30–0.70)

## 2011-11-17 MED ORDER — TEMAZEPAM 15 MG PO CAPS
15.0000 mg | ORAL_CAPSULE | Freq: Every evening | ORAL | Status: DC | PRN
  Administered 2011-11-23: 15 mg via ORAL
  Filled 2011-11-17 (×2): qty 1

## 2011-11-17 MED ORDER — ALPRAZOLAM 0.5 MG PO TABS
0.5000 mg | ORAL_TABLET | Freq: Four times a day (QID) | ORAL | Status: DC | PRN
  Administered 2011-11-17 – 2011-11-26 (×7): 0.5 mg via ORAL
  Filled 2011-11-17 (×8): qty 1

## 2011-11-17 MED ORDER — MOXIFLOXACIN HCL 400 MG PO TABS
400.0000 mg | ORAL_TABLET | Freq: Every day | ORAL | Status: DC
  Administered 2011-11-17 – 2011-11-18 (×2): 400 mg via ORAL
  Filled 2011-11-17 (×3): qty 1

## 2011-11-17 MED ORDER — WARFARIN SODIUM 6 MG PO TABS
6.0000 mg | ORAL_TABLET | Freq: Once | ORAL | Status: AC
  Administered 2011-11-17: 6 mg via ORAL
  Filled 2011-11-17: qty 1

## 2011-11-17 MED ORDER — GUAIFENESIN 100 MG/5ML PO SYRP
200.0000 mg | ORAL_SOLUTION | Freq: Four times a day (QID) | ORAL | Status: DC | PRN
  Filled 2011-11-17: qty 118

## 2011-11-17 MED ORDER — METOPROLOL TARTRATE 12.5 MG HALF TABLET
12.5000 mg | ORAL_TABLET | Freq: Two times a day (BID) | ORAL | Status: DC
  Administered 2011-11-17 – 2011-11-19 (×3): 12.5 mg via ORAL
  Filled 2011-11-17 (×6): qty 1

## 2011-11-17 NOTE — Progress Notes (Signed)
S: Working with PT, some SOB with exertion, none at baseline. 2.6 kg off with HD yest  O:BP 151/75  Pulse 96  Temp 98.9 F (37.2 C) (Oral)  Resp 20  Ht 5\' 8"  (1.727 m)  Wt 100.7 kg (222 lb 0.1 oz)  BMI 33.76 kg/m2  SpO2 92%  Intake/Output Summary (Last 24 hours) at 11/17/11 1130 Last data filed at 11/16/11 1700  Gross per 24 hour  Intake      0 ml  Output      0 ml  Net      0 ml   Weight change:  Gen: Awake and alert CVS:RRR Resp:crackles Rt base Abd:+ BS NTND Ext: 1+ edema pretib bilat NEURO: CNI Ox3 RT IJ perm cath      . amiodarone  200 mg Oral BID  . calcium acetate  1,334 mg Oral TID WC  . diltiazem  90 mg Oral Q12H  . docusate sodium  200 mg Oral Daily  . moxifloxacin  400 mg Oral Q2000  . multivitamin  1 tablet Oral QHS  . pantoprazole  40 mg Oral Q1200  . warfarin  6 mg Oral ONCE-1800  . Warfarin - Pharmacist Dosing Inpatient   Does not apply q1800  . DISCONTD: warfarin  4 mg Oral Daily  . DISCONTD: warfarin  4 mg Oral q1800   Dg Chest 2 View  11/16/2011  *RADIOLOGY REPORT*  Clinical Data: Chest pain.  CHEST - 2 VIEW  Comparison: 11/15/2011.  Findings: Cardiomegaly.  Median sternotomy.  Left subclavian central line is present with the tip in the mid SVC.  Dialysis catheter is present the right IJ approach.  The tip of a dialysis catheter is in the right atrium.  Skin staples are present in the midline.  Triangular opacity is present in the region of the right middle lobe on the lateral view.  This may represent loculated fluid within the major or minor fissure.  Air bronchograms are present in the right lower lobe on the lateral view, likely secondary to atelectasis.  This accounts for opacity at the right lung base on the frontal view.  Chronic lower thoracic compression fracture noted.  Pleural thickening is present in the right upper lobe associated with prior chest tube.  IMPRESSION: 1.  Unchanged support apparatus. 2.  Consolidation of the right middle lobe  versus loculated fluid in the fissures. 3.  Postoperative changes median sternotomy.   Original Report Authenticated By: Andreas Newport, M.D.    BMET    Component Value Date/Time   NA 126* 11/16/2011 0500   K 4.9 11/16/2011 0500   CL 88* 11/16/2011 0500   CO2 20 11/16/2011 0500   GLUCOSE 162* 11/16/2011 0500   BUN 68* 11/16/2011 0500   CREATININE 7.75* 11/16/2011 0500   CALCIUM 8.9 11/16/2011 0500   GFRNONAA 7* 11/16/2011 0500   GFRAA 8* 11/16/2011 0500   CBC    Component Value Date/Time   WBC 10.3 11/17/2011 0625   RBC 2.79* 11/17/2011 0625   HGB 8.0* 11/17/2011 0625   HCT 24.0* 11/17/2011 0625   PLT 304 11/17/2011 0625   MCV 86.0 11/17/2011 0625   MCH 28.7 11/17/2011 0625   MCHC 33.3 11/17/2011 0625   RDW 16.0* 11/17/2011 0625   LYMPHSABS 1.5 10/29/2011 1601   MONOABS 0.7 10/29/2011 1601   EOSABS 0.1 10/29/2011 1601   BASOSABS 0.0 10/29/2011 1601     Assessment: 1. ARF due to contrast nephropathy and ATN, s/p HD x 2. Still anuric.  2. HTN 3. SP Ascending Aortic dissection repair 4. Vol excess, no pulm edema  Plan: 1. Check labs in am, poss HD sunday 2. Cont to work on [employer]  MD [employer] [phone] pgr    [phone] cell 11/17/2011, 11:36 AM

## 2011-11-17 NOTE — Progress Notes (Signed)
Patient c/o shortness of breath.O2 sat 95-96%. Called Respiratory Therapist to give PRN Xopenex. Encourage to use incentive spirometer q 4 hrs. when awake. Will continue to monitor. Report given to next shift nurse.

## 2011-11-17 NOTE — Evaluation (Signed)
Physical Therapy Assessment and Plan  Patient Details  Name: Gabriel Tran MRN: [id number] Date of Birth: 05-25-1955  PT Diagnosis: Difficulty walking, Impaired sensation and Muscle weakness Rehab Potential: Good ELOS: 12-14 days   Today's Date: 11/17/2011 Time: [phone] and 1100-1130 Time Calculation (min): 60 min and 30 min  Problem List:  Patient Active Problem List  Diagnosis  . HYPERLIPIDEMIA  . ESSENTIAL HYPERTENSION, BENIGN  . PSORIASIS  . HYPERGLYCEMIA  . Routine general medical examination at a health care facility  . Back pain  . HTN (hypertension), malignant  . Chest pain  . Leukocytosis  . RBBB  . Aortic dissection  . Acute respiratory failure  . Hypoxemia  . A-fib    Past Medical History:  Past Medical History  Diagnosis Date  . Hypertension   . Hyperglycemia     a. A1C 5.9 in Sept 2013.  Marland Kitchen RBBB    Past Surgical History:  Past Surgical History  Procedure Date  . Knee surgery     LEFT 1981  . Carpal tunnel release     2004 RIGHT  . Wisdom tooth extraction   . Thoracic aortic aneurysm repair 10/30/2011    Procedure: THORACIC ASCENDING ANEURYSM REPAIR (AAA);  Surgeon: Kerin Perna, MD;  Location: [facility] OR;  Service: Open Heart Surgery;  Laterality: N/A;  Ascending aortic dissection repair, arch reconstruction, aortic valve repair  . Insertion of dialysis catheter 11/06/2011    Procedure: INSERTION OF DIALYSIS CATHETER;  Surgeon: Kerin Perna, MD;  Location: [facility] OR;  Service: Thoracic;  Laterality: Right;  . Mediastinal exploration 11/06/2011    Procedure: MEDIASTINAL EXPLORATION;  Surgeon: Kerin Perna, MD;  Location: [facility] OR;  Service: Thoracic;  Laterality: N/A;  sternal closure; removal of wound vac  . Insertion of dialysis catheter 11/13/2011    Procedure: INSERTION OF DIALYSIS CATHETER;  Surgeon: Sherren Kerns, MD;  Location: [facility] OR;  Service: Vascular;  Laterality: N/A;  Ultrasound guided.    Assessment & Plan Clinical Impression:  Patient is a 56 y.o. right-handed male ex smoker as well as history of hypertension admitted 10/29/2011 with chest pain radiating to the neck. Patient was active and independent prior to admission. Noted blood pressure 230/110. EKG revealed right bundle branch block. Chest x-ray showed no acute findings. Cardiac enzymes were negative. Cranial CT scan showed no evidence of acute abnormality. Patient found to have acute type A. ascending aortic dissection with moderate aortic insufficiency. Underwent emergent repair of ascending aortic dissection 11/01/2011. Hospital course with increasing shortness of breath ventilation perfusion scan completed showing intermediate probability for pulmonary embolism. He was placed on low-dose heparin infusion without bolus. Patient became more agitated and developed pulseless electrical activity became hypotensive immediately given CPR and was intubated. Patient underwent emergent sternotomy removing sternal wires/cardiac tamponade 11/04/2011. A chest tube was placed. Patient was extubated 11/10/2011. Developed acute renal failure and started on CVVHD. Nephrology continues to follow with permanent dialysis catheter placement and hemodialysis initiated 11/14/2011. Coumadin has since been initiated for pulmonary emboli and monitoring for any bleeding episodes. He is tolerating a regular consistency diet. Patient received education on sternal precautions.  Patient transferred to CIR on 11/16/2011 .   Patient currently requires min with mobility secondary to muscle weakness, decreased cardiorespiratoy endurance and decreased oxygen support and decreased standing balance, decreased postural control and decreased balance strategies.  Prior to hospitalization, patient was independent with mobility and lived with Spouse in a House home.  Home access is 5 steepStairs to  enter.  Patient will benefit from skilled PT intervention to maximize safe functional mobility, minimize fall risk and  decrease caregiver burden for planned discharge home with intermittent assist.  Anticipate patient will benefit from follow up [facility] at discharge.  PT - End of Session Activity Tolerance: Tolerates 10 - 20 min activity with multiple rests Endurance Deficit: Yes Endurance Deficit Description: on supplemental 02, deconditioned, dialysis, PE PT Assessment Rehab Potential: Good Barriers to Discharge: None PT Plan PT Frequency: 1-2 X/day, 60-90 minutes;5 out of 7 days Estimated Length of Stay: 12-14 days PT Treatment/Interventions: Ambulation/gait training;Balance/vestibular training;Discharge planning;DME/adaptive equipment instruction;Functional mobility training;Neuromuscular re-education;Patient/family education;Stair training;Therapeutic Activities;Therapeutic Exercise;UE/LE Strength taining/ROM PT Recommendation Follow Up Recommendations: Home health PT Equipment Recommended: Other (comment) Equipment Details: TBD by primary PT as patient progresses; may require RW or w/c for longer community distances  PT Evaluation Precautions/Restrictions Precautions Precautions: Sternal;Fall Precaution Comments: PE, acute renal failure; on dialysis, sternal precautions, on blood thinners General @[id number] ([id number]) Vital Signs Therapy Vitals Pulse Rate: 76  Resp: 20  BP: 160/75 mmHg Patient Position, if appropriate: Sitting Oxygen Therapy SpO2: 95 % O2 Device: Nasal cannula O2 Flow Rate (L/min): 2 L/min Pulse Oximetry Type: Intermittent Pain Pain Assessment Pain Assessment: No/denies pain Home Living/Prior Functioning Home Living Lives With: Spouse Available Help at Discharge: Family;Available 24 hours/day Type of Home: House Home Access: Stairs to enter [employer] of Steps: 5 steep Entrance Stairs-Rails: Can reach both Home Layout: One level Home Adaptive Equipment: None Prior Function Level of Independence: Independent with gait;Independent with transfers Able  to Take Stairs?: Yes Driving: Yes 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 Comments: Wife returning to work but plans to take time off once patient D/C Cognition Overall Cognitive Status: Appears within functional limits for tasks assessed Orientation Level: Oriented X4 Sensation Sensation Light Touch: Appears Intact Stereognosis: Appears Intact Hot/Cold: Appears Intact Proprioception: Appears Intact Additional Comments: Patient reporting RUE shoulder-fingertips numbness Coordination Gross Motor Movements are Fluid and Coordinated: Yes Motor  Motor Motor - Skilled Clinical Observations: Generalized weakness  Mobility Bed Mobility Bed Mobility: Not assessed (Patient in recliner due to breathing) Transfers Stand Pivot Transfers: 3: Mod assist Stand Pivot Transfer Details (indicate cue type and reason): Verbal cues to maintain sternal precautions; min-mod A overall to stand from low surface without use of UE.  Min A during pivoting secondary to decreased balance with turning. Locomotion  Ambulation Ambulation: Yes Ambulation/Gait Assistance: 4: Min assist Ambulation Distance (Feet): 225 Feet Assistive device: 1 person hand held assist Ambulation/Gait Assistance Details: 225' on level surface in controlled environment at decreased gait speed and min A overall for balance secondary to wide BOS, decreased step length bilaterally, decreased hip extension.  Also performed 2 minute walk test for endurance; patient ambulated 225' in 2 minutes with min A and Sp02: 95%, HR 76 bpm. Stairs / Additional Locomotion Stairs: Yes Stairs Assistance: 4: Min assist Stair Management Technique: Two rails;Alternating pattern;Forwards Number of Stairs: 5  Height of Stairs: 6  Wheelchair Mobility Wheelchair Mobility: No Distance: Total A secondary to sternal precautions  Trunk/Postural Assessment  Cervical Assessment Cervical  Assessment: Within Functional Limits Thoracic Assessment Thoracic Assessment: Within Functional Limits Lumbar Assessment Lumbar Assessment: Within Functional Limits Postural Control Postural Control: Deficits on evaluation (decreased balance strategies, poor muscular endurance)  Balance Balance Balance Assessed: Yes Standardized Balance Assessment Standardized Balance Assessment: Berg Balance Test Berg Balance Test Sit to Stand: Able to stand without using hands  and stabilize independently Standing Unsupported: Able to stand 30 seconds unsupported Sitting with Back Unsupported but Feet Supported on Floor or Stool: Able to sit safely and securely 2 minutes Stand to Sit: Sits safely with minimal use of hands Transfers: Able to transfer safely, minor use of hands Standing Unsupported with Eyes Closed: Able to stand 10 seconds with supervision Standing Ubsupported with Feet Together: Able to place feet together independently and stand for 1 minute with supervision From Standing, Reach Forward with Outstretched Arm: Can reach forward >12 cm safely (5") From Standing Position, Pick up Object from Floor: Able to pick up shoe, needs supervision From Standing Position, Turn to Look Behind Over each Shoulder: Needs supervision when turning Turn 360 Degrees: Needs close supervision or verbal cueing Standing Unsupported, Alternately Place Feet on Step/Stool: Able to complete 4 steps without aid or supervision Standing Unsupported, One Foot in Front: Able to take small step independently and hold 30 seconds Standing on One Leg: Able to lift leg independently and hold equal to or more than 3 seconds Total Score: 38  Patient demonstrates increased fall risk as noted by score of 38/56 on Berg Balance Scale.  (<36= high risk for falls, close to 100%; 37-45 significant >80%; 46-51 moderate >50%; 52-55 lower >25%) Static Standing Balance Static Standing - Balance Support: No upper extremity  supported Static Standing - Level of Assistance: 4: Min assist Extremity Assessment  RLE Assessment RLE Assessment: Within Functional Limits (poor endurance) LLE Assessment LLE Assessment: Within Functional Limits (poor endurance)  Physical therapy treatment initiated with education on sternal precautions and falls risk as indicated by the Berg balance score of 38/56.  Standing balance, endurance and strengthening exercises initiated standing with bilat UE support at sink during 8 reps each bilat LE heel raises, toe raises, hip flexion marches with sitting rest breaks in between.    See FIM for current functional status Refer to Care Plan for Long Term Goals  Recommendations for other services: None  Discharge Criteria: Patient will be discharged from PT if patient refuses treatment 3 consecutive times without medical reason, if treatment goals not met, if there is a change in medical status, if patient makes no progress towards goals or if patient is discharged from hospital.  The above assessment, treatment plan, treatment alternatives and goals were discussed and mutually agreed upon: by patient and by family  [place] 11/17/2011, 11:54 AM

## 2011-11-17 NOTE — Progress Notes (Signed)
ANTICOAGULATION CONSULT NOTE  Pharmacy Consult for Heparin/coumadin overlap  Indication: Pulmonary embolus   No Known Allergies  Patient Measurements: Height: 5\' 8"  (172.7 cm) Weight: 222 lb 0.1 oz (100.7 kg) IBW/kg (Calculated) : 68.4  Heparin Dosing Weight: 89.9  Vital Signs: Temp: 98.9 F (37.2 C) (10/12 0622) Temp src: Oral (10/12 0622) BP: 151/75 mmHg (10/12 0824) Pulse Rate: 96  (10/12 0824)  Labs:  Basename 11/17/11 0625 11/16/11 0500 11/15/11 0429  HGB 8.0* 7.4* --  HCT 24.0* 22.5* 23.2*  PLT 304 352 327  APTT -- -- --  LABPROT 16.8* 20.5* 16.5*  INR 1.40 1.83* 1.37  HEPARINUNFRC <0.10* >2.00* <0.10*  CREATININE -- 7.75* 5.22*  CKTOTAL -- -- --  CKMB -- -- --  TROPONINI -- -- --    Estimated Creatinine Clearance: 12.5 ml/min (by C-G formula based on Cr of 7.75).  Assessment: 56 year old male s/p repair AAA 9/24, complicated post-op course including PE 9/28. Patient underwent emergent sternotomy removing sternal wires/cardiac tamponade 11/04/2011. A chest tube was placed. Patient was extubated 11/10/2011. Developed acute renal failure creatinine increased to 6.22 likely ATN from dye exposure during TAA repair and started on CVVHD.   Heparin drip continues at 1500 units/hr, no interruptions/problems with line/infusion reported by RN, level now undetectable. Today is ovelap day #6 of heparin/coumadin and INR is subtherapeutic and trending down.  Heparin overlap needs to continue until INR =/>2  for >24 hours for acute PE per CHEST guidelines. H/H and plts are wnl, no bleeding reported. Patient continues on home dose of Amiodarone.   Goal of Therapy:  INR 2.0 - 2.5  Per Dr. Donata Clay (note on 11/16/11) 0.3-0.5 per Dr. Donata Clay (Monitor platelets by anticoagulation protocol: Yes   Plan:  - Increase heparin drip to 1700 units/hr now.  - Check heparin level in 8 hours.  - Coumadin 6mg  PO x 1 today- will d/c order for 4mg  daily for now - PT/INR, CBC, heparin  level daily.  Thanks, Merranda Bolls K. Allena Katz, PharmD, BCPS.  Clinical Pharmacist Pager [phone]. 11/17/2011 8:42 AM

## 2011-11-17 NOTE — Progress Notes (Signed)
ANTICOAGULATION CONSULT NOTE  Pharmacy Consult for Heparin/coumadin overlap  Indication: Pulmonary embolus   No Known Allergies  Patient Measurements: Height: 5\' 8"  (172.7 cm) Weight: 222 lb 0.1 oz (100.7 kg) IBW/kg (Calculated) : 68.4  Heparin Dosing Weight: 89.9  Vital Signs: Temp: 97.5 F (36.4 C) (10/12 1441) Temp src: Oral (10/12 1441) BP: 175/82 mmHg (10/12 1441) Pulse Rate: 73  (10/12 1441)  Labs:  Basename 11/17/11 0625 11/16/11 0500 11/15/11 0429  HGB 8.0* 7.4* --  HCT 24.0* 22.5* 23.2*  PLT 304 352 327  APTT -- -- --  LABPROT 16.8* 20.5* 16.5*  INR 1.40 1.83* 1.37  HEPARINUNFRC <0.10* >2.00* <0.10*  CREATININE -- 7.75* 5.22*  CKTOTAL -- -- --  CKMB -- -- --  TROPONINI -- -- --    Estimated Creatinine Clearance: 12.5 ml/min (by C-G formula based on Cr of 7.75).  Assessment: 56 year old male s/p repair AAA 9/24, complicated post-op course including PE 9/28. Patient underwent emergent sternotomy removing sternal wires/cardiac tamponade 11/04/2011. A chest tube was placed. Patient was extubated 11/10/2011. Developed acute renal failure creatinine increased to 6.22 likely ATN from dye exposure during TAA repair and started on CVVHD.   Labs are not crossing into EPIC -- per discussion with hematology, heparin level drawn at 1616 resulted at <0.1. Level drawn slightly early as heparin rate appears to have been changed around 1000. No s/sx of bleeding noted per nurse report. Will increase heparin drip rate cautiously this evening. No s/sx of bleeding noted per nurse report.  Goal of Therapy:  INR 2.0 - 2.5  Per Dr. Donata Clay (note on 11/16/11) 0.3-0.5 per Dr. Donata Clay (Monitor platelets by anticoagulation protocol: Yes   Plan:  1. Increase heparin drip to 1900 units/hr (19 ml/hr) 2. Will continue to monitor for any signs/symptoms of bleeding and will follow up with heparin level in 8 hours   Georgina Pillion, PharmD, BCPS Clinical Pharmacist Pager:  [phone] 11/17/2011 6:41 PM

## 2011-11-17 NOTE — Evaluation (Signed)
Occupational Therapy Assessment and Plan  Patient Details  Name: Gabriel Tran MRN: [id number] Date of Birth: 1956-01-10  OT Diagnosis: muscle weakness (generalized) and decreased endurance Rehab Potential:  good ELOS:   10-14 days  Today's Date: 11/17/2011 Time: 0800-0900Time Calculation (min): 60 min  1st session  Time:  1615-1700  (45 min)  2nd session  Problem List:  Patient Active Problem List  Diagnosis  . HYPERLIPIDEMIA  . ESSENTIAL HYPERTENSION, BENIGN  . PSORIASIS  . HYPERGLYCEMIA  . Routine general medical examination at a health care facility  . Back pain  . HTN (hypertension), malignant  . Chest pain  . Leukocytosis  . RBBB  . Aortic dissection  . Acute respiratory failure  . Hypoxemia  . A-fib    Past Medical History:  Past Medical History  Diagnosis Date  . Hypertension   . Hyperglycemia     a. A1C 5.9 in Sept 2013.  Marland Kitchen RBBB    Past Surgical History:  Past Surgical History  Procedure Date  . Knee surgery     LEFT 1981  . Carpal tunnel release     2004 RIGHT  . Wisdom tooth extraction   . Thoracic aortic aneurysm repair 10/30/2011    Procedure: THORACIC ASCENDING ANEURYSM REPAIR (AAA);  Surgeon: Kerin Perna, MD;  Location: [facility] OR;  Service: Open Heart Surgery;  Laterality: N/A;  Ascending aortic dissection repair, arch reconstruction, aortic valve repair  . Insertion of dialysis catheter 11/06/2011    Procedure: INSERTION OF DIALYSIS CATHETER;  Surgeon: Kerin Perna, MD;  Location: [facility] OR;  Service: Thoracic;  Laterality: Right;  . Mediastinal exploration 11/06/2011    Procedure: MEDIASTINAL EXPLORATION;  Surgeon: Kerin Perna, MD;  Location: [facility] OR;  Service: Thoracic;  Laterality: N/A;  sternal closure; removal of wound vac  . Insertion of dialysis catheter 11/13/2011    Procedure: INSERTION OF DIALYSIS CATHETER;  Surgeon: Sherren Kerns, MD;  Location: [facility] OR;  Service: Vascular;  Laterality: N/A;  Ultrasound guided.    Assessment &  Plan Clinical Impression:HPI:Gabriel Tran is a 56 y.o. right-handed male ex smoker as well as history of hypertension admitted 10/29/2011 with chest pain radiating to the neck. Patient was active and independent prior to admission. Noted blood pressure 230/110. EKG revealed right bundle branch block. Chest x-ray showed no acute findings. Cardiac enzymes were negative. Cranial CT scan showed no evidence of acute abnormality.  Patient found to have acute type A. ascending aortic dissection with moderate aortic insufficiency. Underwent emergent repair of ascending aortic dissection 11/01/2011 per Dr. Zenaida Niece trigt. Hospital course with increasing shortness of breath ventilation perfusion scan completed showing intermediate probability for pulmonary embolism. He was placed on low-dose heparin infusion without bolus. Patient became more agitated and developed pulseless electrical activity became hypotensive immediately given CPR and was intubated. Patient underwent emergent sternotomy removing sternal wires/cardiac tamponade 11/04/2011. A chest tube was placed. Patient was extubated 11/10/2011. Developed acute renal failure creatinine increased to 6.22 likely ATN from dye exposure during TAA repair and started on CVVHD. Nephrology continues to follow with permanent dialysis catheter placement and hemodialysis initiated 11/14/2011. Coumadin has since been initiated for pulmonary emboli and monitoring for any bleeding episodes. He is tolerating a regular consistency diet. Patient received education on sternal precautions. Physical therapy and occupational evaluations completed 11/13/2011 with recommendations of physical medicine rehabilitation consult to consider inpatient rehabilitation services secondary to deconditioning and prolonged intubation. Patient was felt to begin candidate for inpatient rehabilitation services and  was admitted for comprehensive rehabilitation program.    Patient transferred to CIR on  11/16/2011 .    Patient currently requires mod with basic self-care skills secondary to decreased cardiorespiratoy endurance and decreased oxygen support.  Prior to hospitalization, patient could complete BADL with independence.   no Patient will benefit from skilled intervention to increase independence with basic self-care skills prior to discharge home with care partner.  Anticipate patient will require intermittent supervision and follow up home health.  OT Assessment Rehab Potential: Good Barriers to Discharge: None OT Plan OT Frequency: 1-2 X/day, 60-90 minutes  OT Evaluation Precautions/Restrictions  Precautions Precautions: Sternal;Fall Precaution Comments: Re-educated on sternal precautions. Restrictions Weight Bearing Restrictions: No General   Vital Signs Therapy Vitals Pulse Rate: 76  Resp: 20  BP: 160/75 mmHg Patient Position, if appropriate: Sitting Oxygen Therapy SpO2: 95 % O2 Device: Nasal cannula O2 Flow Rate (L/min): 2 L/min FiO2 (%): 28 % Pulse Oximetry Type: Intermittent Pain Pain Assessment Pain Assessment: No/denies pain Pain Score:   2 Pain Type: Surgical pain Pain Location: Chest Pain Orientation: Mid Pain Descriptors: Aching Pain Frequency: Intermittent Pain Onset: Gradual Pain Intervention(s): Medication (See eMAR) Home Living/Prior Functioning Home Living Lives With: Spouse Available Help at Discharge: Family;Available 24 hours/day Type of Home: House Home Access: Stairs to enter [employer] of Steps: 4 Entrance Stairs-Rails: Right Home Layout: One level Bathroom Shower/Tub: Engineer, manufacturing systems: Standard Bathroom Accessibility: Yes How Accessible: Accessible via walker Home Adaptive Equipment: None Prior Function Able to Take Stairs?: Reciprically Driving: Yes Vocation: Full time employment ADL   Vision/Perception  Vision - History Baseline Vision: Wears glasses all the time Perception Perception:  Within Functional Limits Praxis Praxis: Intact  Cognition Arousal/Alertness: Awake/alert Orientation Level: Oriented X4 Sensation Sensation Light Touch: Appears Intact Coordination Gross Motor Movements are Fluid and Coordinated: Yes Fine Motor Movements are Fluid and Coordinated: Yes Motor  Motor Motor - Skilled Clinical Observations: Generalized weakness Mobility  Bed Mobility Bed Mobility: Not assessed (Patient in recliner due to breathing)  Trunk/Postural Assessment  Cervical Assessment Cervical Assessment: Within Functional Limits Thoracic Assessment Thoracic Assessment: Within Functional Limits Lumbar Assessment Lumbar Assessment: Within Functional Limits Postural Control Postural Control: Deficits on evaluation (decreased balance strategies, poor muscular endurance)  Balance Balance Balance Assessed: Yes Standardized Balance Assessment Standardized Balance Assessment: Berg Balance Test Berg Balance Test Sit to Stand: Able to stand without using hands and stabilize independently Standing Unsupported: Able to stand 30 seconds unsupported Sitting with Back Unsupported but Feet Supported on Floor or Stool: Able to sit safely and securely 2 minutes Stand to Sit: Sits safely with minimal use of hands Transfers: Able to transfer safely, minor use of hands Standing Unsupported with Eyes Closed: Able to stand 10 seconds with supervision Standing Ubsupported with Feet Together: Able to place feet together independently and stand for 1 minute with supervision From Standing, Reach Forward with Outstretched Arm: Can reach forward >12 cm safely (5") From Standing Position, Pick up Object from Floor: Able to pick up shoe, needs supervision From Standing Position, Turn to Look Behind Over each Shoulder: Needs supervision when turning Turn 360 Degrees: Needs close supervision or verbal cueing Standing Unsupported, Alternately Place Feet on Step/Stool: Able to complete 4 steps  without aid or supervision Standing Unsupported, One Foot in Front: Able to take small step independently and hold 30 seconds Standing on One Leg: Able to lift leg independently and hold equal to or more than 3 seconds Total Score: 38  Static Standing  Balance Static Standing - Balance Support: No upper extremity supported Static Standing - Level of Assistance: 4: Min assist Extremity/Trunk Assessment    RUE and LUE:   Sternal Precautions-- Deficits in shoulder AROM )-90 flexion; 2/5 strength at shoulder with difficulty raising past 90 degress without assist.  Other joints are wfl  And 4/5 with decreased right tip to tip pinch.ip   See FIM for current functional status Refer to Care Plan for Long Term Goals  Recommendations for other services: None  Discharge Criteria: Patient will be discharged from OT if patient refuses treatment 3 consecutive times without medical reason, if treatment goals not met, if there is a change in medical status, if patient makes no progress towards goals or if patient is discharged from hospital.  The above assessment, treatment plan, treatment alternatives and goals were discussed and mutually agreed upon: by patient  Humberto Seals 11/17/2011, 12:05 PM

## 2011-11-17 NOTE — Plan of Care (Addendum)
Overall Plan of Care [facility]) Patient Details Name: Gabriel Tran MRN: [id number] DOB: 1955/04/10  Diagnosis:  Deconditioning after aortic aneurysm repair  Primary Diagnosis:    Physical deconditioning Co-morbidities: htn, low back pain, renal failure  Functional Problem List  Patient demonstrates impairments in the following areas: Balance, Bladder, Bowel, Edema, Endurance, Medication Management, Motor, Pain, Safety, Sensory  and Skin Integrity  Basic ADL's: grooming, bathing, dressing, toileting and toilet and shower transfers Advanced ADL's: light housekeeping  Transfers:  bed mobility, bed to chair, toilet, tub/shower and car Locomotion:  ambulation and stairs  Additional Impairments:  Functional use of upper extremity and Social Cognition   problem solving, memory, attention and awareness  Anticipated Outcomes Item Anticipated Outcome  Eating/Swallowing  Independent with set up  Basic self-care  Mod I  Tolieting  Mod. Independent  Bowel/Bladder  Mod. Independent  Transfers  Mod I  Locomotion  Mod I  Production designer, theatre/television/film  Supervision with complex  Pain  Pain level less than 3  Safety/Judgment  Minimal assist  Other     Therapy Plan: PT Frequency: 1-2 X/day, 60-90 minutes;5 out of 7 days OT Frequency: 1-2 X/day, 60-90 minutes SLP Frequency: 1-2 X/day, 30-60 minutes;3 out of 7 days   Team Interventions: Item RN PT OT SLP SW TR Other  Self Care/Advanced ADL Retraining   x      Neuromuscular Re-Education  x x      Therapeutic Activities  x x x  x   UE/LE Strength Training/ROM  x x   x   UE/LE Coordination Activities   x   x   Visual/Perceptual Remediation/Compensation         DME/Adaptive Equipment Instruction  x x   x   Therapeutic Exercise  x x   x   Balance/Vestibular Training  x x   x   Patient/Family Education X x x x  x   Cognitive Remediation/Compensation    x     Functional Mobility Training  x x      Ambulation/Gait Training  x         Furniture conservator/restorer Reintegration      x   Dysphagia/Aspiration Film/video editor         Bladder Management X        Bowel Management X        Disease Management/Prevention X        Pain Management X        Medication Management X   x     Skin Care/Wound Management X        Splinting/Orthotics         Discharge Planning X x    x   Psychosocial Support X     x                      Team Discharge Planning: Destination:  Home Projected Follow-up:  PT, OT and Home Health SLP OP Projected Equipment Needs:  Tub Bench, Environmental consultant and Wheelchair for longer community distances?? Patient/family involved in discharge planning:  Yes  MD ELOS: 10-14 days Medical Rehab Prognosis:  Excellent Assessment: pt admitted for cir therapies. The team will be addressing stamina, strength, balance, fxnl mobility, self-care, pulmonary  issues, pain, anxiety. Goals are mod I.

## 2011-11-17 NOTE — Progress Notes (Signed)
Subjective/Complaints: A little restless last night. Has shortness of breath at times, even while resting (this is not new)  Objective: Vital Signs: Blood pressure 151/75, pulse 96, temperature 98.9 F (37.2 C), temperature source Oral, resp. rate 20, height 5\' 8"  (1.727 m), weight 100.7 kg (222 lb 0.1 oz), SpO2 99.00%. Dg Chest 2 View  11/16/2011  *RADIOLOGY REPORT*  Clinical Data: Chest pain.  CHEST - 2 VIEW  Comparison: 11/15/2011.  Findings: Cardiomegaly.  Median sternotomy.  Left subclavian central line is present with the tip in the mid SVC.  Dialysis catheter is present the right IJ approach.  The tip of a dialysis catheter is in the right atrium.  Skin staples are present in the midline.  Triangular opacity is present in the region of the right middle lobe on the lateral view.  This may represent loculated fluid within the major or minor fissure.  Air bronchograms are present in the right lower lobe on the lateral view, likely secondary to atelectasis.  This accounts for opacity at the right lung base on the frontal view.  Chronic lower thoracic compression fracture noted.  Pleural thickening is present in the right upper lobe associated with prior chest tube.  IMPRESSION: 1.  Unchanged support apparatus. 2.  Consolidation of the right middle lobe versus loculated fluid in the fissures. 3.  Postoperative changes median sternotomy.   Original Report Authenticated By: Andreas Newport, M.D.     Basename 11/17/11 0625 11/16/11 0500  WBC 10.3 11.6*  HGB 8.0* 7.4*  HCT 24.0* 22.5*  PLT 304 352    Basename 11/16/11 0500 11/15/11 0429  NA 126* 130*  K 4.9 4.7  CL 88* 96  CO2 20 23  GLUCOSE 162* 150*  BUN 68* 45*  CREATININE 7.75* 5.22*  CALCIUM 8.9 8.7   CBG (last 3)   Basename 11/17/11 0737 11/16/11 2350 11/16/11 2101  GLUCAP 109* 134* 148*    Wt Readings from Last 3 Encounters:  11/16/11 100.7 kg (222 lb 0.1 oz)  11/16/11 103.7 kg (228 lb 9.9 oz)  11/16/11 103.7 kg (228 lb 9.9  oz)    Physical Exam:  Constitutional: He is oriented to person, place, and time. He appears well-developed.  HENT: mucosa pink and moist  Head: Normocephalic.  Eyes:  Pupils round and reactive to light  Neck: Neck supple. No thyromegaly present.  Cardiovascular: Normal rate and regular rhythm.  Pulmonary/Chest: altered, echoing sound in right lung field. He has no wheezes. Occasional dyspnea with speech Abdominal: Soft. Bowel sounds are normal. He exhibits no distension.  Neurological: He is alert and oriented to person, place, and time. resasonable memory, insight, and awareness.  Follows three-step commands. Strength grossly 4/5 in the upper ext and 3-4 in the lower ext's  Skin:  Chest tube out. Area clean and dressed. Wounds all clean without drainage Psychiatric: He has a normal mood and affect.  Reduced sensation right second third digits seems improved.    Assessment/Plan: 1. Functional deficits secondary to deconditioning after aortic aneurysm repair/mediastinal exploration, multiple medical issues which require 3+ hours per day of interdisciplinary therapy in a comprehensive inpatient rehab setting. Physiatrist is providing close team supervision and 24 hour management of active medical problems listed below. Physiatrist and rehab team continue to assess barriers to discharge/monitor patient progress toward functional and medical goals. FIM:                   Comprehension Comprehension Mode: Auditory Comprehension: 6-Follows complex conversation/direction: With extra time/assistive  device  Expression Expression Mode: Verbal Expression: 6-Expresses complex ideas: With extra time/assistive device  Social Interaction Social Interaction: 7-Interacts appropriately with others - No medications needed.     Memory Memory: 6-More than reasonable amt of time  Medical Problem List and Plan:  1. Deconditioning following emergency aortic aneurysm repair and  mediastinal exploration  2. 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 Monitor for any bleeding episodes, coumadin adjustment per pharmacy with avelox initiation. 3. Pain Management: Oxycodone as needed. Monitor with increased mobility  4. Neuropsych: This patient is capable of making decisions on his/her own behalf.  5. Acute renal failure secondary to contrast nephropathy and ATN. Hemodialysis initiated. Ongoing mgt per renal services. Latest creatinine 5.22  6. Hypertension. Cardizem. 90 mg every 12 hours, amiodarone 200 mg twice a day.   Monitor with increased activity  7. Postoperative anemia. Latest hemoglobin 8.0 Transfuse as needed. Patient denies any chest pain or increased shortness of breath. ?epo candidate 8. Shortness of Breath: cxr with RML consolidation. He's afebrile. Begin empiric avelox    -Recheck CXR in am  -robitussin for occasional cough  -O2 via [place]  LOS (Days) 1 A FACE TO FACE EVALUATION WAS PERFORMED  Ariaunna Longsworth T 11/17/2011, 8:31 AM

## 2011-11-18 ENCOUNTER — Inpatient Hospital Stay (HOSPITAL_COMMUNITY): Payer: PRIVATE HEALTH INSURANCE

## 2011-11-18 ENCOUNTER — Inpatient Hospital Stay (HOSPITAL_COMMUNITY): Payer: PRIVATE HEALTH INSURANCE | Admitting: Physical Therapy

## 2011-11-18 LAB — RENAL FUNCTION PANEL
Albumin: 2.2 g/dL — ABNORMAL LOW (ref 3.5–5.2)
GFR calc Af Amer: 9 mL/min — ABNORMAL LOW (ref >90)
GFR calc non Af Amer: 8 mL/min — ABNORMAL LOW (ref >90)
Phosphorus: 7.1 mg/dL — ABNORMAL HIGH (ref 2.3–4.6)
Potassium: 5.5 mEq/L — ABNORMAL HIGH (ref 3.5–5.1)
Sodium: 120 mEq/L — ABNORMAL LOW (ref 135–145)

## 2011-11-18 LAB — CBC
HCT: 24.6 % — ABNORMAL LOW (ref 39.0–52.0)
Hemoglobin: 8.3 g/dL — ABNORMAL LOW (ref 13.0–17.0)
MCH: 28.6 pg (ref 26.0–34.0)
MCHC: 33.7 g/dL (ref 30.0–36.0)
MCV: 84.8 fL (ref 78.0–100.0)
Platelets: 229 K/uL (ref 150–400)
RBC: 2.9 MIL/uL — ABNORMAL LOW (ref 4.22–5.81)
RDW: 15 % (ref 11.5–15.5)
WBC: 11.8 K/uL — ABNORMAL HIGH (ref 4.0–10.5)

## 2011-11-18 LAB — PROTIME-INR
INR: 1.41 (ref 0.00–1.49)
Prothrombin Time: 16.9 s — ABNORMAL HIGH (ref 11.6–15.2)

## 2011-11-18 LAB — HEPARIN LEVEL (UNFRACTIONATED): Heparin Unfractionated: <0.1 IU/mL — ABNORMAL LOW (ref 0.30–0.70)

## 2011-11-18 LAB — GLUCOSE, CAPILLARY
Glucose-Capillary: 99 mg/dL (ref 70–99)
Glucose-Capillary: 144 mg/dL — ABNORMAL HIGH (ref 70–99)
Glucose-Capillary: 123 mg/dL — ABNORMAL HIGH (ref 70–99)

## 2011-11-18 MED ORDER — HEPARIN SODIUM (PORCINE) 1000 UNIT/ML DIALYSIS
2000.0000 [IU] | INTRAMUSCULAR | Status: DC | PRN
  Filled 2011-11-18: qty 2

## 2011-11-18 MED ORDER — HEPARIN SODIUM (PORCINE) 1000 UNIT/ML DIALYSIS
2000.0000 [IU] | INTRAMUSCULAR | Status: AC | PRN
  Administered 2011-11-18 – 2011-11-19 (×2): 2000 [IU] via INTRAVENOUS_CENTRAL

## 2011-11-18 MED ORDER — ENOXAPARIN SODIUM 100 MG/ML ~~LOC~~ SOLN
100.0000 mg | SUBCUTANEOUS | Status: DC
  Administered 2011-11-18: 100 mg via SUBCUTANEOUS
  Filled 2011-11-18 (×2): qty 1

## 2011-11-18 MED ORDER — WARFARIN SODIUM 6 MG PO TABS
6.0000 mg | ORAL_TABLET | Freq: Every day | ORAL | Status: DC
  Administered 2011-11-18: 6 mg via ORAL
  Filled 2011-11-18 (×2): qty 1

## 2011-11-18 NOTE — Plan of Care (Signed)
Problem: RH SAFETY Goal: RH STG ADHERE TO SAFETY PRECAUTIONS W/ASSISTANCE/DEVICE STG Adhere to Safety Precautions With Assistance/Device. Supervision  Calls appropriately for assistance     

## 2011-11-18 NOTE — Progress Notes (Signed)
At 1945 patient complained of feeling SOB while being transferred from chair to bed, then became anxious causing increased SOB. PRN xopenex given with relief. Wife reports patient gets anxious when he feels SOB. @2205  complained of feeling nauseated, PRN zofran (PO) given. Paged Dr. Riley Kill for med for anxiety and med for sleep. At 0200 patient awake, offered PRN restoril, patient declined. Gabriel Tran A

## 2011-11-18 NOTE — Progress Notes (Signed)
ANTICOAGULATION CONSULT NOTE - Follow Up Consult  Pharmacy Consult for Lovenox/Coumadin Indication: pulmonary embolus  No Known Allergies  Patient Measurements: Height: 5\' 8"  (172.7 cm) Weight: 232 lb 9.4 oz (105.5 kg) IBW/kg (Calculated) : 68.4   Vital Signs: Temp: 98.3 F (36.8 C) (10/13 0507) Temp src: Oral (10/13 0507) BP: 193/95 mmHg (10/13 1027) Pulse Rate: 72  (10/13 1027)  Labs:  Basename 11/18/11 0248 11/17/11 1616 11/17/11 0625 11/16/11 0500  HGB 8.3* -- 8.0* --  HCT 24.6* -- 24.0* 22.5*  PLT 229 -- 304 352  APTT -- -- -- --  LABPROT 16.9* -- 16.8* 20.5*  INR 1.41 -- 1.40 1.83*  HEPARINUNFRC <0.10* <0.10* <0.10* --  CREATININE 7.10* -- -- 7.75*  CKTOTAL -- -- -- --  CKMB -- -- -- --  TROPONINI -- -- -- --    Estimated Creatinine Clearance: 14 ml/min (by C-G formula based on Cr of 7.1).   Medications:  Scheduled:    . amiodarone  200 mg Oral BID  . calcium acetate  1,334 mg Oral TID WC  . diltiazem  90 mg Oral Q12H  . docusate sodium  200 mg Oral Daily  . metoprolol tartrate  12.5 mg Oral BID  . moxifloxacin  400 mg Oral Q2000  . multivitamin  1 tablet Oral QHS  . pantoprazole  40 mg Oral Q1200  . warfarin  6 mg Oral ONCE-1800  . Warfarin - Pharmacist Dosing Inpatient   Does not apply q1800   Infusions:    . DISCONTD: heparin 2,200 Units/hr (11/18/11 0406)    Assessment: 56 year old male s/p repair AAA 9/24, complicated post-op course including PE 9/28. Patient underwent emergent sternotomy removing sternal wires/cardiac tamponade 11/04/2011. A chest tube was placed. Patient was extubated 11/10/2011. Developed acute renal failure creatinine increased to 6.22 likely ATN from dye exposure during TAA repair and started on CVVHD.  Heparin drip continues at 2200 units/hr but still remains undetectable. Plt decreasing as well on heparin. Plan to change to lovenox as ordered by MD. INR subtherapeutic but dose increased yesterday so anticipate INR increase  tomorrow. Noted drug interaction with amiodarone. Patient will need to have INR >2.0 x 24 hours before we d/c lovenox.  Goal of Therapy:  INR 2-3 Anti-Xa level 0.6-1.2 units/ml 4hrs after LMWH dose given Monitor platelets by anticoagulation protocol: Yes   Plan:  D/c heparin gtt. Lovenox 100mg  sq daily and continue coumadin 6mg  daily. F/u daily protime.  Verlene Mayer, PharmD, BCPS Pager [phone] 11/18/2011,10:53 AM

## 2011-11-18 NOTE — Progress Notes (Signed)
Physical Therapy Note  Patient Details  Name: Gabriel Tran MRN: [id number] Date of Birth: 1955-11-04 Today's Date: 11/18/2011  1005 - 1020 ( 15 minutes) individual (pt missed 45 minutes - awaiting dialysis) Pain: no complaint of pain Other: Pt reports MD requested pt to have dialysis this AM before therapy to remove fluids. Pt taken to dialysis but returned secondary to emergency dialysis. Reviewed bedside exercises with patient - ankle pumps X 20, quad sets X 20 , heel slides X 20.   Kaliq Lege,JIM 11/18/2011, 7:43 AM

## 2011-11-18 NOTE — Progress Notes (Signed)
S: More SOB today, coughing a lot  O:BP 169/97  Pulse 74  Temp 98.3 F (36.8 C) (Oral)  Resp 15  Ht 5\' 8"  (1.727 m)  Wt 105.5 kg (232 lb 9.4 oz)  BMI 35.36 kg/m2  SpO2 97%  Intake/Output Summary (Last 24 hours) at 11/18/11 1459 Last data filed at 11/18/11 0800  Gross per 24 hour  Intake    840 ml  Output      1 ml  Net    839 ml   Weight change: 4.8 kg (10 lb 9.3 oz) Gen: Awake and alert CVS:RRR Resp:crackles bilat bases 1/3 up Abd:+ BS NTND Ext: 1+ edema pretib bilat NEURO: CNI Ox3 RT IJ perm cath      . amiodarone  200 mg Oral BID  . calcium acetate  1,334 mg Oral TID WC  . diltiazem  90 mg Oral Q12H  . docusate sodium  200 mg Oral Daily  . enoxaparin (LOVENOX) injection  100 mg Subcutaneous Q24H  . metoprolol tartrate  12.5 mg Oral BID  . moxifloxacin  400 mg Oral Q2000  . multivitamin  1 tablet Oral QHS  . pantoprazole  40 mg Oral Q1200  . warfarin  6 mg Oral ONCE-1800  . warfarin  6 mg Oral q1800  . Warfarin - Pharmacist Dosing Inpatient   Does not apply q1800   Dg Chest 2 View  11/18/2011  *RADIOLOGY REPORT*  Clinical Data: Short of breath  CHEST - 2 VIEW  Comparison: 11/16/2011  Findings: Right middle lobe consolidation versus subpulmonic right pleural effusion is again noted.  There is increasing density in the posterior right hemithorax.  Nodular density at the right apex laterally is stable.  Similar density at the left apex laterally is stable.  Left subclavian center venous catheter removed.  Dialysis catheters stable.  Cardiomegaly.  No pneumothorax. Thoracolumbar compression fractures unchanged.  IMPRESSION: Increasing density at the right lung base either due to consolidation or subpulmonic pleural effusion.  Bilateral upper lobe nodular opacities stable.   Original Report Authenticated By: Donavan Burnet, M.D.    BMET    Component Value Date/Time   NA 120* 11/18/2011 0248   K 5.5* 11/18/2011 0248   CL 85* 11/18/2011 0248   CO2 21 11/18/2011 0248     GLUCOSE 100* 11/18/2011 0248   BUN 60* 11/18/2011 0248   CREATININE 7.10* 11/18/2011 0248   CALCIUM 8.9 11/18/2011 0248   GFRNONAA 8* 11/18/2011 0248   GFRAA 9* 11/18/2011 0248   CBC    Component Value Date/Time   WBC 11.8* 11/18/2011 0248   RBC 2.90* 11/18/2011 0248   HGB 8.3* 11/18/2011 0248   HCT 24.6* 11/18/2011 0248   PLT 229 11/18/2011 0248   MCV 84.8 11/18/2011 0248   MCH 28.6 11/18/2011 0248   MCHC 33.7 11/18/2011 0248   RDW 15.0 11/18/2011 0248   LYMPHSABS 1.5 10/29/2011 1601   MONOABS 0.7 10/29/2011 1601   EOSABS 0.1 10/29/2011 1601   BASOSABS 0.0 10/29/2011 1601     Assessment: 1. ARF due to contrast nephropathy and ATN, s/p HD x 2. Still anuric. 2. HTN 3. SP Ascending Aortic dissection repair 4. Vol excess, worse- HD this afternoon  Plan: 1. HD today and tomorrow  2. Cont to work on [employer]  MD [employer] [phone] pgr    [phone] cell 11/18/2011, 2:59 PM

## 2011-11-18 NOTE — Progress Notes (Signed)
Subjective/Complaints: A little restless last night when he tends to get a little SOB. Anxiety increases his SOB as well.  Objective: Vital Signs: Blood pressure 163/85, pulse 70, temperature 98.3 F (36.8 C), temperature source Oral, resp. rate 14, height 5\' 8"  (1.727 m), weight 105.5 kg (232 lb 9.4 oz), SpO2 95.00%. Dg Chest 2 View  11/18/2011  *RADIOLOGY REPORT*  Clinical Data: Short of breath  CHEST - 2 VIEW  Comparison: 11/16/2011  Findings: Right middle lobe consolidation versus subpulmonic right pleural effusion is again noted.  There is increasing density in the posterior right hemithorax.  Nodular density at the right apex laterally is stable.  Similar density at the left apex laterally is stable.  Left subclavian center venous catheter removed.  Dialysis catheters stable.  Cardiomegaly.  No pneumothorax. Thoracolumbar compression fractures unchanged.  IMPRESSION: Increasing density at the right lung base either due to consolidation or subpulmonic pleural effusion.  Bilateral upper lobe nodular opacities stable.   Original Report Authenticated By: Donavan Burnet, M.D.    Dg Chest 2 View  11/16/2011  *RADIOLOGY REPORT*  Clinical Data: Chest pain.  CHEST - 2 VIEW  Comparison: 11/15/2011.  Findings: Cardiomegaly.  Median sternotomy.  Left subclavian central line is present with the tip in the mid SVC.  Dialysis catheter is present the right IJ approach.  The tip of a dialysis catheter is in the right atrium.  Skin staples are present in the midline.  Triangular opacity is present in the region of the right middle lobe on the lateral view.  This may represent loculated fluid within the major or minor fissure.  Air bronchograms are present in the right lower lobe on the lateral view, likely secondary to atelectasis.  This accounts for opacity at the right lung base on the frontal view.  Chronic lower thoracic compression fracture noted.  Pleural thickening is present in the right upper lobe  associated with prior chest tube.  IMPRESSION: 1.  Unchanged support apparatus. 2.  Consolidation of the right middle lobe versus loculated fluid in the fissures. 3.  Postoperative changes median sternotomy.   Original Report Authenticated By: Andreas Newport, M.D.     Basename 11/18/11 0248 11/17/11 0625  WBC 11.8* 10.3  HGB 8.3* 8.0*  HCT 24.6* 24.0*  PLT 229 304    Basename 11/18/11 0248 11/16/11 0500  NA 120* 126*  K 5.5* 4.9  CL 85* 88*  CO2 21 20  GLUCOSE 100* 162*  BUN 60* 68*  CREATININE 7.10* 7.75*  CALCIUM 8.9 8.9   CBG (last 3)   Basename 11/17/11 2038 11/17/11 1627 11/17/11 1136  GLUCAP 144* 105* 104*    Wt Readings from Last 3 Encounters:  11/18/11 105.5 kg (232 lb 9.4 oz)  11/16/11 103.7 kg (228 lb 9.9 oz)  11/16/11 103.7 kg (228 lb 9.9 oz)    Physical Exam:  Constitutional: He is oriented to person, place, and time. He appears well-developed.  HENT: mucosa pink and moist  Head: Normocephalic.  Eyes:  Pupils round and reactive to light  Neck: Neck supple. No thyromegaly present.  Cardiovascular: Normal rate and regular rhythm.  Pulmonary/Chest: decreased breath sounds in right lower half of lung. He has no wheezes. Occasional dyspnea with speech Abdominal: Soft. Bowel sounds are normal. He exhibits no distension.  Neurological: He is alert and oriented to person, place, and time. resasonable memory, insight, and awareness.  Follows three-step commands. Strength grossly 4/5 in the upper ext and 3-4 in the lower ext's  Skin:  Chest tube out. Area clean and dressed. Wounds all clean without drainage Psychiatric: He has a normal mood and affect.  Reduced sensation right second third digits seems improved.    Assessment/Plan: 1. Functional deficits secondary to deconditioning after aortic aneurysm repair/mediastinal exploration, multiple medical issues which require 3+ hours per day of interdisciplinary therapy in a comprehensive inpatient rehab  setting. Physiatrist is providing close team supervision and 24 hour management of active medical problems listed below. Physiatrist and rehab team continue to assess barriers to discharge/monitor patient progress toward functional and medical goals. FIM:             FIM - Bed/Chair Transfer Bed/Chair Transfer: 0: Activity did not occur (patient in recliner)  FIM - Locomotion: Wheelchair Distance: Total A secondary to sternal precautions Locomotion: Wheelchair: 1: Total Assistance/staff pushes wheelchair (Pt<25%) (secondary to sternal precautions) FIM - Locomotion: Ambulation Locomotion: Ambulation Assistive Devices: Other (comment) (HHA) Ambulation/Gait Assistance: 4: Min assist Locomotion: Ambulation: 4: Travels 150 ft or more with minimal assistance (Pt.>75%)  Comprehension Comprehension Mode: Auditory Comprehension: 6-Follows complex conversation/direction: With extra time/assistive device  Expression Expression Mode: Verbal Expression: 6-Expresses complex ideas: With extra time/assistive device  Social Interaction Social Interaction: 7-Interacts appropriately with others - No medications needed.  Problem Solving Problem Solving: 5-Solves basic problems: With no assist  Memory Memory: 5-Recognizes or recalls 90% of the time/requires cueing < 10% of the time  Medical Problem List and Plan:  1. Deconditioning following emergency aortic aneurysm repair and mediastinal exploration  2. 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 Monitor for any bleeding episodes, coumadin adjustment per pharmacy with avelox initiation. 3. Pain Management: Oxycodone as needed. Monitor with increased mobility  4. Neuropsych: This patient is capable of making decisions on his/her own behalf.  5. Acute renal failure secondary to contrast nephropathy and ATN. Hemodialysis initiated. Ongoing mgt per renal services. Latest creatinine 5.22  6.  Hypertension. Cardizem. 90 mg every 12 hours, amiodarone 200 mg twice a day. Lopressor initiated yesterday.     7. Postoperative anemia. Latest hemoglobin 8.0 Transfuse as needed. Patient denies any chest pain or increased shortness of breath. ?epo candidate 8. Shortness of Breath: cxr with RML consolidation. He's afebrile. Begin empiric avelox    -Recheck CXR done, this appears to be more of an effusion than infiltrate  -will contact surgery for follow up  -robitussin for occasional cough  -O2 via [place]  -fairly comfortable at present  LOS (Days) 2 A FACE TO FACE EVALUATION WAS PERFORMED  SWARTZ,ZACHARY T 11/18/2011, 8:14 AM

## 2011-11-18 NOTE — Progress Notes (Signed)
   CARDIOTHORACIC SURGERY PROGRESS NOTE  Subjective: More short of breath today.  More comfortable sitting up in chair.  Poor appetite.  Dialysis reportedly currently on hold because of lack of staff in dialysis unit.  Objective: Vital signs in last 24 hours: Temp:  [97.5 F (36.4 C)-98.3 F (36.8 C)] 98.3 F (36.8 C) (10/13 0507) Pulse Rate:  [60-76] 72  (10/13 1027) Cardiac Rhythm:  [-]  Resp:  [14-16] 15  (10/13 1027) BP: (163-193)/(82-96) 184/96 mmHg (10/13 1159) SpO2:  [94 %-96 %] 96 % (10/13 1027) Weight:  [105.5 kg (232 lb 9.4 oz)] 105.5 kg (232 lb 9.4 oz) (10/13 0507)  Physical Exam:    Breath sounds: Diminished both bases R>L  Heart sounds:  RRR  Incisions:  Healing well  Abdomen:  soft  Extremities:  Warm, swollen R>L   Intake/Output from previous day: 10/12 0701 - 10/13 0700 In: 960 [P.O.:960] Out: 1 [Stool:1] Intake/Output this shift: Total I/O In: 240 [P.O.:240] Out: -   Lab Results:  [facility] 11/18/11 0248 11/17/11 0625  WBC 11.8* 10.3  HGB 8.3* 8.0*  HCT 24.6* 24.0*  PLT 229 304   BMET:  Basename 11/18/11 0248 11/16/11 0500  NA 120* 126*  K 5.5* 4.9  CL 85* 88*  CO2 21 20  GLUCOSE 100* 162*  BUN 60* 68*  CREATININE 7.10* 7.75*  CALCIUM 8.9 8.9    CBG (last 3)   Basename 11/18/11 1141 11/18/11 0731 11/17/11 2038  GLUCAP 112* 99 144*   PT/INR:   Basename 11/18/11 0248  LABPROT 16.9*  INR 1.41    CXR:  *RADIOLOGY REPORT*  Clinical Data: Short of breath  CHEST - 2 VIEW  Comparison: 11/16/2011  Findings: Right middle lobe consolidation versus subpulmonic right  pleural effusion is again noted. There is increasing density in  the posterior right hemithorax. Nodular density at the right apex  laterally is stable. Similar density at the left apex laterally is  stable. Left subclavian center venous catheter removed. Dialysis  catheters stable. Cardiomegaly. No pneumothorax. Thoracolumbar  compression fractures unchanged.  IMPRESSION:   Increasing density at the right lung base either due to  consolidation or subpulmonic pleural effusion.  Bilateral upper lobe nodular opacities stable.  Original Report Authenticated By: Donavan Burnet, M.D.   Assessment/Plan:  Mr Tipler looks volume-overloaded and needs dialysis.  CXR does reveal slight increase in right pleural effusion, and warrants follow up.  Will make an effort to get Mr Steege to HD as soon as possible.  Check repeat CXR in am.  OWEN,CLARENCE H 11/18/2011 1:25 PM

## 2011-11-18 NOTE — Progress Notes (Signed)
Pt down in radiology for x-ray at beginning of shift. Upon return of pt to unit, orders written for Hemodialysis. Report given to dialysis nurse, daily medication held per policy, as well as bid blood pressure medication per dialysis nurse Consuella Lose.   Dialysis transporter came to unit at 0930 to transport pt to dialysis. Shortly after transported, received called from dialysis nurse to state that pt had to be returned to unit, as they had received an order of an emergent nature, that needed to take priority. Pt returned to unit with SOB, and anxiety. BP checked with reading of 193/95. Lopressor 12.5 mg given which was previously held prior to dialysis.  Order given to nurse tech to recheck BP in 1hr. BP recheck with reading of 192/97. Pt anxious, c/o of difficult breathing. Cardizem given which was previously held due to pt being off of unit, and respiratory called for nebulized treatment. Rechecked BP, reading 184/96.   Pt reported feeling better, prior to transfer to HD. Up to chair for a short period of time. BP 167/87 prior to transport to dialysis.

## 2011-11-18 NOTE — Progress Notes (Signed)
ANTICOAGULATION CONSULT NOTE - Follow Up Consult  Pharmacy Consult for heparin Indication: pulmonary embolus  Labs:  Basename 11/18/11 0248 11/17/11 1616 11/17/11 0625 11/16/11 0500 11/15/11 0429  HGB 8.3* -- 8.0* -- --  HCT 24.6* -- 24.0* 22.5* --  PLT 229 -- 304 352 --  APTT -- -- -- -- --  LABPROT 16.9* -- 16.8* 20.5* --  INR 1.41 -- 1.40 1.83* --  HEPARINUNFRC <0.10* <0.10* <0.10* -- --  CREATININE 7.10* -- -- 7.75* 5.22*  CKTOTAL -- -- -- -- --  CKMB -- -- -- -- --  TROPONINI -- -- -- -- --    Assessment: 56yo male remains undetectable on heparin after multiple rate increases.  Goal of Therapy:  Heparin level 0.3-0.5 units/ml   Plan:  Will increase heparin gtt by 3 units/kg/hr to 2200 units/hr and check level in 8hr.  Colleen Can PharmD BCPS 11/18/2011,4:03 AM

## 2011-11-19 ENCOUNTER — Inpatient Hospital Stay (HOSPITAL_COMMUNITY): Payer: PRIVATE HEALTH INSURANCE

## 2011-11-19 ENCOUNTER — Inpatient Hospital Stay (HOSPITAL_COMMUNITY): Payer: PRIVATE HEALTH INSURANCE | Admitting: Physical Therapy

## 2011-11-19 ENCOUNTER — Inpatient Hospital Stay (HOSPITAL_COMMUNITY): Payer: PRIVATE HEALTH INSURANCE | Admitting: Occupational Therapy

## 2011-11-19 DIAGNOSIS — R5381 Other malaise: Secondary | ICD-10-CM

## 2011-11-19 DIAGNOSIS — I359 Nonrheumatic aortic valve disorder, unspecified: Secondary | ICD-10-CM

## 2011-11-19 DIAGNOSIS — N179 Acute kidney failure, unspecified: Secondary | ICD-10-CM

## 2011-11-19 DIAGNOSIS — Z5189 Encounter for other specified aftercare: Secondary | ICD-10-CM

## 2011-11-19 DIAGNOSIS — J9600 Acute respiratory failure, unspecified whether with hypoxia or hypercapnia: Secondary | ICD-10-CM

## 2011-11-19 LAB — GLUCOSE, CAPILLARY: Glucose-Capillary: 111 mg/dL — ABNORMAL HIGH (ref 70–99)

## 2011-11-19 LAB — CBC
Hemoglobin: 8.1 g/dL — ABNORMAL LOW (ref 13.0–17.0)
MCH: 28.6 pg (ref 26.0–34.0)
MCV: 86.2 fL (ref 78.0–100.0)
RBC: 2.83 MIL/uL — ABNORMAL LOW (ref 4.22–5.81)

## 2011-11-19 LAB — RENAL FUNCTION PANEL
Albumin: 2.3 g/dL — ABNORMAL LOW (ref 3.5–5.2)
BUN: 39 mg/dL — ABNORMAL HIGH (ref 6–23)
Chloride: 88 mEq/L — ABNORMAL LOW (ref 96–112)
GFR calc non Af Amer: 11 mL/min — ABNORMAL LOW (ref >90)
Potassium: 4.5 mEq/L (ref 3.5–5.1)

## 2011-11-19 LAB — FERRITIN: Ferritin: 870 ng/mL — ABNORMAL HIGH (ref 22–322)

## 2011-11-19 LAB — PROTIME-INR: Prothrombin Time: 22.6 seconds — ABNORMAL HIGH (ref 11.6–15.2)

## 2011-11-19 MED ORDER — BISACODYL 10 MG RE SUPP
10.0000 mg | Freq: Every day | RECTAL | Status: DC | PRN
  Administered 2011-11-21: 10 mg via RECTAL

## 2011-11-19 MED ORDER — METOPROLOL TARTRATE 25 MG PO TABS
25.0000 mg | ORAL_TABLET | Freq: Two times a day (BID) | ORAL | Status: DC
  Administered 2011-11-19 – 2011-11-27 (×16): 25 mg via ORAL
  Filled 2011-11-19 (×19): qty 1

## 2011-11-19 MED ORDER — BISACODYL 10 MG RE SUPP
10.0000 mg | Freq: Once | RECTAL | Status: DC
  Filled 2011-11-19: qty 1

## 2011-11-19 MED ORDER — WARFARIN SODIUM 4 MG PO TABS
4.0000 mg | ORAL_TABLET | Freq: Once | ORAL | Status: DC
  Filled 2011-11-19: qty 1

## 2011-11-19 MED ORDER — CLONIDINE HCL 0.1 MG PO TABS
0.1000 mg | ORAL_TABLET | Freq: Two times a day (BID) | ORAL | Status: DC
  Administered 2011-11-19 – 2011-11-26 (×14): 0.1 mg via ORAL
  Filled 2011-11-19 (×19): qty 1

## 2011-11-19 MED ORDER — FLEET ENEMA 7-19 GM/118ML RE ENEM
1.0000 | ENEMA | Freq: Once | RECTAL | Status: AC
  Administered 2011-11-20: 05:00:00 via RECTAL
  Filled 2011-11-19: qty 1

## 2011-11-19 MED ORDER — PSYLLIUM 95 % PO PACK
1.0000 | PACK | Freq: Every day | ORAL | Status: DC
  Administered 2011-11-19 – 2011-11-26 (×7): 1 via ORAL
  Filled 2011-11-19 (×11): qty 1

## 2011-11-19 NOTE — Progress Notes (Signed)
Subjective:  Had dialysis yesterday If weights are believable he lost 5 kg then gained back 2 overnight Says dyspnea is less and less Making only small amounts of urine (voloumes not being recorded)  Objective:   11/19/11 0700 103.1 kg (227 lb 4.7 oz) -- -- CP  11/18/11 1830 101.8 kg (224 lb 6.9 oz) -- -- BT  11/18/11 1450 106.6 kg (235 lb 0.2 oz) -- -- ES  11/18/11 0507 105.5 kg (232 lb 9.4 oz) I/O last 3 completed shifts: In: 1200 [P.O.:1200] Out: 5002 [Urine:1; Other:5000; Stool:1] Total I/O In: 240 [P.O.:240] Out: -    Vital signs in last 24 hours: Filed Vitals:   11/19/11 0200 11/19/11 0532 11/19/11 0700 11/19/11 1030  BP:  156/90    Pulse:  76    Temp:  97.4 F (36.3 C)    TempSrc:  Oral    Resp:  18    Height:      Weight:   103.1 kg (227 lb 4.7 oz)   SpO2: 93% 92%  91%   Weight change: 1.1 kg (2 lb 6.8 oz)  Intake/Output Summary (Last 24 hours) at 11/19/11 1248 Last data filed at 11/19/11 0800  Gross per 24 hour  Intake    720 ml  Output   5002 ml  Net  -4282 ml    Physical Exam:  Blood pressure 156/90, pulse 76, temperature 97.4 F (36.3 C), temperature source Oral, resp. rate 18, height 5\' 8"  (1.727 m), weight 103.1 kg (227 lb 4.7 oz), SpO2 91.00%. Up in the chair eating lunch Decreased breath sounds right chest compared to left Sternal staples in place S1S2 no s3 Abdomen obese, protuberant 2-3+ pretibial pitting edema  Lab 11/19/11 0606 11/18/11 0248 11/16/11 0500 11/15/11 0429 11/14/11 0439 11/13/11 0456 11/12/11 1600  NA 125* 120* 126* 130* 126* 130* 131*  K 4.5 5.5* 4.9 4.7 4.8 4.9 4.8  CL 88* 85* 88* 96 92* 95* 96  CO2 24 21 20 23  18* 22 24  GLUCOSE 124* 100* 162* 150* 106* 99 114*  BUN 39* 60* 68* 45* 72* 42* 26*  CREATININE 5.27* 7.10* 7.75* 5.22* 6.77* 4.28* 2.69*  ALB -- -- -- -- -- -- --  CALCIUM 8.4 8.9 8.9 8.7 8.8 9.0 8.8  PHOS 5.7* 7.1* 7.8* 5.9* 8.4* 5.3* 3.6     Lab 11/19/11 0606 11/18/11 0248 11/16/11 0500 11/15/11 0429  11/13/11 0456  AST -- -- 28 35 57*  ALT -- -- 86* 111* 221*  ALKPHOS -- -- 126* 143* 175*  BILITOT -- -- 0.9 1.0 1.5*  PROT -- -- 6.2 6.0 6.6  ALBUMIN 2.3* 2.2* 2.2* -- --    Lab 11/19/11 0606 11/18/11 0248 11/17/11 0625 11/16/11 0500  WBC 8.9 11.8* 10.3 11.6*  NEUTROABS -- -- -- --  HGB 8.1* 8.3* 8.0* 7.4*  HCT 24.4* 24.6* 24.0* 22.5*  MCV 86.2 84.8 86.0 85.2  PLT 261 229 304 352    Lab 11/19/11 1232 11/18/11 2224 11/18/11 1859 11/18/11 1141 11/18/11 0731  GLUCAP 111* 123* 144* 112* 99    Studies/Results: Dg Chest 2 View  11/19/2011  *RADIOLOGY REPORT*  Clinical Data: Pleural effusions.  CHEST - 2 VIEW  Comparison: November 18, 2011.  Findings: Sternotomy wires are again noted.  No change is noted in position of right internal jugular dialysis catheter.  No change is noted in bilateral upper lobe nodular densities as described above. Mild bilateral pleural effusions are noted which are not significantly changed.  No pneumothorax  is noted.  IMPRESSION: Mild bilateral pleural effusions which are not significantly changed compared to prior exam.   Original Report Authenticated By: Venita Sheffield., M.D.    Dg Chest 2 View  11/18/2011  *RADIOLOGY REPORT*  Clinical Data: Short of breath  CHEST - 2 VIEW  Comparison: 11/16/2011  Findings: Right middle lobe consolidation versus subpulmonic right pleural effusion is again noted.  There is increasing density in the posterior right hemithorax.  Nodular density at the right apex laterally is stable.  Similar density at the left apex laterally is stable.  Left subclavian center venous catheter removed.  Dialysis catheters stable.  Cardiomegaly.  No pneumothorax. Thoracolumbar compression fractures unchanged.  IMPRESSION: Increasing density at the right lung base either due to consolidation or subpulmonic pleural effusion.  Bilateral upper lobe nodular opacities stable.   Original Report Authenticated By: Donavan Burnet, M.D.         Medications . amiodarone  200 mg Oral BID  . calcium acetate  1,334 mg Oral TID WC  . diltiazem  90 mg Oral Q12H  . docusate sodium  200 mg Oral Daily  . enoxaparin (LOVENOX) injection  100 mg Subcutaneous Q24H  . metoprolol tartrate  12.5 mg Oral BID  . multivitamin  1 tablet Oral QHS  . pantoprazole  40 mg Oral Q1200  . psyllium  1 packet Oral Daily  . warfarin  4 mg Oral ONCE-1800  . Warfarin - Pharmacist Dosing Inpatient   Does not apply q1800  . DISCONTD: moxifloxacin  400 mg Oral Q2000  . DISCONTD: warfarin  6 mg Oral q1800     I  have reviewed scheduled and prn medications.  ASSESSMENT/RECOMMENDATIONS 1. S/P emergency repair type A ascending aortic dissection 10/30/11 with emergency sternotomy for tamponade 9/28, sternal closure 10/1 2. Dialysis dependent AKI in the setting of #1 plus contrast plus ACE; renal arteries patent Dialysis dependent since 11/06/11 (initially CRRT then transition to HD)   Had HD yesterday for volume and will have again today for volume and azotemia; no evidence yet of renal recovery Fluid restrict 3. PE on Coumadin  4. Atrial arrythmias on cardizem 5. Anemia - do not see that he has received any ESA's; check iron studies; dose if appropriate 6. CKD-MBD - check phos/calcium with HD; on binders   Camille Bal, MD [facility] [phone] Pager 11/19/2011, 12:48 PM

## 2011-11-19 NOTE — Progress Notes (Signed)
Social Work  Social Work Assessment and Plan  Patient Details  Name: Gabriel Tran MRN: [id number] Date of Birth: Apr 02, 1955  Today's Date: 11/19/2011  Problem List:  Patient Active Problem List  Diagnosis  . HYPERLIPIDEMIA  . ESSENTIAL HYPERTENSION, BENIGN  . PSORIASIS  . HYPERGLYCEMIA  . Routine general medical examination at a health care facility  . Back pain  . HTN (hypertension), malignant  . Chest pain  . Leukocytosis  . RBBB  . Aortic dissection  . Acute respiratory failure  . Hypoxemia  . A-fib  . Physical deconditioning   Past Medical History:  Past Medical History  Diagnosis Date  . Hypertension   . Hyperglycemia     a. A1C 5.9 in Sept 2013.  Marland Kitchen RBBB    Past Surgical History:  Past Surgical History  Procedure Date  . Knee surgery     LEFT 1981  . Carpal tunnel release     2004 RIGHT  . Wisdom tooth extraction   . Thoracic aortic aneurysm repair 10/30/2011    Procedure: THORACIC ASCENDING ANEURYSM REPAIR (AAA);  Surgeon: Kerin Perna, MD;  Location: [facility] OR;  Service: Open Heart Surgery;  Laterality: N/A;  Ascending aortic dissection repair, arch reconstruction, aortic valve repair  . Insertion of dialysis catheter 11/06/2011    Procedure: INSERTION OF DIALYSIS CATHETER;  Surgeon: Kerin Perna, MD;  Location: [facility] OR;  Service: Thoracic;  Laterality: Right;  . Mediastinal exploration 11/06/2011    Procedure: MEDIASTINAL EXPLORATION;  Surgeon: Kerin Perna, MD;  Location: [facility] OR;  Service: Thoracic;  Laterality: N/A;  sternal closure; removal of wound vac  . Insertion of dialysis catheter 11/13/2011    Procedure: INSERTION OF DIALYSIS CATHETER;  Surgeon: Sherren Kerns, MD;  Location: [facility] OR;  Service: Vascular;  Laterality: N/A;  Ultrasound guided.   Social History:  reports that he quit smoking about 21 years ago. His smoking use included Cigarettes. He does not have any smokeless tobacco history on file. He reports that he drinks alcohol. He reports  that he does not use illicit drugs.  Family / Support Systems Marital Status: Married Patient Roles: Spouse;Other (Comment) (employee) Spouse/Significant Other: wife, [name] @ (C) [phone] Children: two adult children living in [place] and [facility] Other Supports: pt's mother (44), [name], is local and can offer some supervision support (she has Parkinson's, per pt) Anticipated Caregiver: wife and pt's mother Ability/Limitations of Caregiver: works days, can take some time off, but is coordinating that with a coworker also  Caregiver Availability: 24/7 (initally for a week or two but wants to return to work by 11) Family Dynamics: pt describes wife and family as very supportive -   Social History Preferred language: English Religion:  Cultural Background: NA Education: college Read: Yes Write: Yes 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 Legal Hisotry/Current Legal Issues: none Guardian/Conservator: none   Abuse/Neglect Physical Abuse: Denies Verbal Abuse: Denies Sexual Abuse: Denies Exploitation of patient/patient's resources: Denies Self-Neglect: Denies  Emotional Status Pt's affect, behavior adn adjustment status: Pleasant, oriented gentleman who is able to provide all info, however, complain of being very "winded" from previous therapy.  Denies any significant emotional distress, however, will monitor as pt progresses.  May need neuropsych involvement at some point.  Recent Psychosocial Issues: None Pyschiatric History: NOne Substance Abuse History: none  Patient / Family Perceptions, Expectations & Goals Pt/Family understanding of illness & functional limitations: Pt able to offer fairly detailed report of his medical issues and  procedures performed.  Good understanding of his current functional limitations as well. Premorbid pt/family roles/activities: Pt was working full-time with much out of state travel.  Very active. Anticipated changes in roles/activities/participation: Pt may intially need some supervision at home - return to work ability and ability to travel is TBD Pt/family expectations/goals: Pt's primary focus is to build his stamina and respiratory status - "then I'll think about work"  [employer]: None Premorbid Home Care/DME Agencies: None Transportation available at discharge: yes  Discharge Planning Living Arrangements: Spouse/significant other Support Systems: Spouse/significant other;Parent Type of Residence: Engineer, materials residence Civil engineer, contracting: Media planner (specify) Counselling psychologist) Financial Resources: Employment Surveyor, quantity Screen Referred: No Living Expenses: Database administrator Management: Patient Do you have any problems obtaining your medications?: No Home Management: pt and wife Patient/Family Preliminary Plans: Pt plans to d/c home with wife - mother to assist if needed Social Work Anticipated Follow Up Needs: HH/OP Expected length of stay: ELOS 7 to 10 days  Clinical Impression Pleasant, oriented gentleman here after AAA repair. Deconditioned and motivated to rebuild strength.  Good family support.  Anticipate short LOS.   [name] 11/19/2011, 3:37 PM

## 2011-11-19 NOTE — Progress Notes (Addendum)
[address]            [place] [address]          [phone]          Subjective: Feeling better today, less SOB  Objective   Temp:  [97.4 F (36.3 C)-99.1 F (37.3 C)] 97.4 F (36.3 C) (10/14 0532) Pulse Rate:  [67-85] 76  (10/14 0532) Resp:  [15-22] 18  (10/14 0532) BP: (145-193)/(68-97) 156/90 mmHg (10/14 0532) SpO2:  [92 %-97 %] 92 % (10/14 0532) Weight:  [224 lb 6.9 oz (101.8 kg)-235 lb 0.2 oz (106.6 kg)] 227 lb 4.7 oz (103.1 kg) (10/14 0700)   Intake/Output Summary (Last 24 hours) at 11/19/11 0857 Last data filed at 11/18/11 2300  Gross per 24 hour  Intake    600 ml  Output   5002 ml  Net  -4402 ml       General appearance: alert, cooperative and no distress Heart: regular rate and rhythm Lungs: dim Right>left lower fields Abdomen: + BS, soft, nontender, mod distension Extremities: + edema Wound: incisions healing well, staples intact  Lab Results:  Basename 11/19/11 0606 11/18/11 0248  NA 125* 120*  K 4.5 5.5*  CL 88* 85*  CO2 24 21  GLUCOSE 124* 100*  BUN 39* 60*  CREATININE 5.27* 7.10*  CALCIUM 8.4 8.9  MG -- --  PHOS 5.7* 7.1*    Basename 11/19/11 0606 11/18/11 0248  AST -- --  ALT -- --  ALKPHOS -- --  BILITOT -- --  PROT -- --  ALBUMIN 2.3* 2.2*   No results found for this basename: LIPASE:2,AMYLASE:2 in the last 72 hours  Basename 11/19/11 0606 11/18/11 0248  WBC 8.9 11.8*  NEUTROABS -- --  HGB 8.1* 8.3*  HCT 24.4* 24.6*  MCV 86.2 84.8  PLT 261 229   No results found for this basename: CKTOTAL:4,CKMB:4,TROPONINI:4 in the last 72 hours No components found with this basename: POCBNP:3 No results found for this basename: DDIMER in the last 72 hours No results found for this basename: HGBA1C in the last 72 hours No results found for this basename: CHOL,HDL,LDLCALC,TRIG,CHOLHDL in the last 72 hours No results found for this basename: TSH,T4TOTAL,FREET3,T3FREE,THYROIDAB in the last 72 hours No  results found for this basename: VITAMINB12,FOLATE,FERRITIN,TIBC,IRON,RETICCTPCT in the last 72 hours  Medications: Scheduled    . amiodarone  200 mg Oral BID  . calcium acetate  1,334 mg Oral TID WC  . diltiazem  90 mg Oral Q12H  . docusate sodium  200 mg Oral Daily  . enoxaparin (LOVENOX) injection  100 mg Subcutaneous Q24H  . metoprolol tartrate  12.5 mg Oral BID  . multivitamin  1 tablet Oral QHS  . pantoprazole  40 mg Oral Q1200  . warfarin  6 mg Oral q1800  . Warfarin - Pharmacist Dosing Inpatient   Does not apply q1800  . DISCONTD: moxifloxacin  400 mg Oral Q2000     Radiology/Studies:  Dg Chest 2 View  11/19/2011  *RADIOLOGY REPORT*  Clinical Data: Pleural effusions.  CHEST - 2 VIEW  Comparison: November 18, 2011.  Findings: Sternotomy wires are again noted.  No change is noted in position of right internal jugular dialysis catheter.  No change is noted in bilateral upper lobe nodular densities as described above. Mild bilateral pleural effusions are noted which are not significantly changed.  No pneumothorax is noted.  IMPRESSION: Mild bilateral pleural effusions which are not significantly changed compared to prior exam.   Original Report Authenticated By: Venita Sheffield., M.D.    Dg Chest 2 View  11/18/2011  *RADIOLOGY REPORT*  Clinical Data: Short of breath  CHEST - 2 VIEW  Comparison: 11/16/2011  Findings: Right middle lobe consolidation versus subpulmonic right pleural effusion is again noted.  There is increasing density in the posterior right hemithorax.  Nodular density at the right apex laterally is stable.  Similar density at the left apex laterally is stable.  Left subclavian center venous catheter removed.  Dialysis catheters stable.  Cardiomegaly.  No pneumothorax. Thoracolumbar compression fractures unchanged.  IMPRESSION: Increasing density at the right lung base either due to consolidation or subpulmonic pleural effusion.  Bilateral upper lobe nodular opacities  stable.   Original Report Authenticated By: Donavan Burnet, M.D.     INR: Will add last result for INR, ABG once components are confirmed Will add last 4 CBG results once components are confirmed  Assessment/Plan: 1. Doing better after dialysis yesterday 2 monitor effusions closely 3 Rehab 4 requests metamucil, will order   LOS: 3 days    GOLD,WAYNE E 10/14/20138:57 AM    CXR today with less R effusion INR now 2.0 -  Stop lovenox Cont coumadin with goal INR 2.0-2.5 I reviewed 2D echo done today and the mild-mod pericardial effusion persists w/o evidence of tamponade but if INR gets too high he could develop tamponade

## 2011-11-19 NOTE — Progress Notes (Signed)
Physical Therapy Session Note  Patient Details  Name: Gabriel Tran MRN: [id number] Date of Birth: [birth year]  Today's Date: 11/19/2011 Time: 0932-1030 Time Calculation (min): 58 min  Short Term Goals: Week 1:  PT Short Term Goal 1 (Week 1): = LTG of mod I overall  Skilled Therapeutic Interventions/Progress Updates:    Ambulation x 140', 160' using wheelchair for support then walker min-guard assist. Steps x 2 with min assist. Step ups for strengthening x 5 reps each leg forwards then to the side for strengthening and balance. Pt required seated rest between each. Obstacle course negotitating cones and stepping over obstacles for dynamic balance, cues needed for safety with stepping over obstacle. NuStep level 2 x 5 min , SpO2 monitored entire time >95% on 3L [place] O2, HR 78-85.   Cues required througout for adherence for sternal precautions  Therapy Documentation Precautions:  Precautions Precautions: Sternal;Fall Precaution Comments: PE, acute renal failure; on dialysis, sternal precautions, on blood thinners Restrictions Weight Bearing Restrictions: No Vital Signs: Oxygen Therapy SpO2: 91 % (post ambulation) O2 Device: Nasal cannula O2 Flow Rate (L/min): 3 L/min Pulse Oximetry Type: Intermittent Pain: Pain Assessment Pain Assessment: No/denies pain   See FIM for current functional status  Therapy/Group: Individual Therapy  Wilhemina Bonito 11/19/2011, 12:03 PM

## 2011-11-19 NOTE — Progress Notes (Signed)
Occupational Therapy Session Note  Patient Details  Name: Gabriel Tran MRN: [id number] Date of Birth: [birth year]  Today's Date: 11/19/2011 Time: 0800-0900 Time Calculation (min): 60 min  Short Term Goals: Week 1:  OT Short Term Goal 1 (Week 1): Pt.  will bathe LB with supervision while standing OT Short Term Goal 2 (Week 1): Pt. will dress LB while standing with supervision OT Short Term Goal 3 (Week 1): Pt. will perform breathing strategies with 1 questioning cue.   OT Short Term Goal 4 (Week 1): Pt. will transfer to toilet with mod I OT Short Term Goal 5 (Week 1): Pt. will perform light household task for 10 mins. at standing level at mod I level.    Skilled Therapeutic Interventions/Progress Updates:    Pt sitting EOB and agreeable to participating in bathing and dressing tasks at sink.  Pt amb with HHA to sink and to bathroom for toileiting.  Pt required assistance to bathe feet and donn socks.  Pt required increased time to complete all tasks and exhibited 4/5 dyspnea.  O2 sats >90% on 3L O2 via nasal canula.  Focus on activity tolerance, standing balance, and safety awareness.  Therapy Documentation Precautions:  Precautions Precautions: Sternal;Fall Precaution Comments: PE, acute renal failure; on dialysis, sternal precautions, on blood thinners Restrictions Weight Bearing Restrictions: No  Pain: Pain Assessment Pain Assessment: No/denies pain Pain Score: 0-No pain  See FIM for current functional status  Therapy/Group: Individual Therapy  Rich Brave 11/19/2011, 9:05 AM

## 2011-11-19 NOTE — Progress Notes (Signed)
Subjective/Complaints: Feels better today. wfie and staff have reported apneic episodes at night. He tells me that cpap had been recommended before. Anxiety increases his SOB as well.  Objective: Vital Signs: Blood pressure 156/90, pulse 76, temperature 97.4 F (36.3 C), temperature source Oral, resp. rate 18, height 5\' 8"  (1.727 m), weight 103.1 kg (227 lb 4.7 oz), SpO2 92.00%. Dg Chest 2 View  11/19/2011  *RADIOLOGY REPORT*  Clinical Data: Pleural effusions.  CHEST - 2 VIEW  Comparison: November 18, 2011.  Findings: Sternotomy wires are again noted.  No change is noted in position of right internal jugular dialysis catheter.  No change is noted in bilateral upper lobe nodular densities as described above. Mild bilateral pleural effusions are noted which are not significantly changed.  No pneumothorax is noted.  IMPRESSION: Mild bilateral pleural effusions which are not significantly changed compared to prior exam.   Original Report Authenticated By: Venita Sheffield., M.D.    Dg Chest 2 View  11/18/2011  *RADIOLOGY REPORT*  Clinical Data: Short of breath  CHEST - 2 VIEW  Comparison: 11/16/2011  Findings: Right middle lobe consolidation versus subpulmonic right pleural effusion is again noted.  There is increasing density in the posterior right hemithorax.  Nodular density at the right apex laterally is stable.  Similar density at the left apex laterally is stable.  Left subclavian center venous catheter removed.  Dialysis catheters stable.  Cardiomegaly.  No pneumothorax. Thoracolumbar compression fractures unchanged.  IMPRESSION: Increasing density at the right lung base either due to consolidation or subpulmonic pleural effusion.  Bilateral upper lobe nodular opacities stable.   Original Report Authenticated By: Donavan Burnet, M.D.     Basename 11/19/11 0606 11/18/11 0248  WBC 8.9 11.8*  HGB 8.1* 8.3*  HCT 24.4* 24.6*  PLT 261 229    Basename 11/19/11 0606 11/18/11 0248  NA 125* 120*    K 4.5 5.5*  CL 88* 85*  CO2 24 21  GLUCOSE 124* 100*  BUN 39* 60*  CREATININE 5.27* 7.10*  CALCIUM 8.4 8.9   CBG (last 3)   Basename 11/18/11 2224 11/18/11 1859 11/18/11 1141  GLUCAP 123* 144* 112*    Wt Readings from Last 3 Encounters:  11/19/11 103.1 kg (227 lb 4.7 oz)  11/16/11 103.7 kg (228 lb 9.9 oz)  11/16/11 103.7 kg (228 lb 9.9 oz)    Physical Exam:  Constitutional: He is oriented to person, place, and time. He appears well-developed.  HENT: mucosa pink and moist  Head: Normocephalic.  Eyes:  Pupils round and reactive to light  Neck: Neck supple. No thyromegaly present.  Cardiovascular: Normal rate and regular rhythm.  Pulmonary/Chest: decreased breath sounds in right lower half of lung. He has no wheezes. Appears more comfortable today. Abdominal: Soft. Bowel sounds are normal. He exhibits no distension.  Neurological: He is alert and oriented to person, place, and time. resasonable memory, insight, and awareness.  Follows three-step commands. Strength grossly 4/5 in the upper ext and 3-4 in the lower ext's  Skin:  Chest tube out. Area clean and dressed. Chest/abd incision clean and intact with staples. Mild drainage only from RLQ wound. Psychiatric: He has a normal mood and affect.  Reduced sensation right second third digits seems improved.    Assessment/Plan: 1. Functional deficits secondary to deconditioning after aortic aneurysm repair/mediastinal exploration, multiple medical issues which require 3+ hours per day of interdisciplinary therapy in a comprehensive inpatient rehab setting. Physiatrist is providing close team supervision and 24 hour  management of active medical problems listed below. Physiatrist and rehab team continue to assess barriers to discharge/monitor patient progress toward functional and medical goals. FIM:             FIM - Bed/Chair Transfer Bed/Chair Transfer: 0: Activity did not occur (patient in recliner)  FIM -  Locomotion: Wheelchair Distance: Total A secondary to sternal precautions Locomotion: Wheelchair: 1: Total Assistance/staff pushes wheelchair (Pt<25%) (secondary to sternal precautions) FIM - Locomotion: Ambulation Locomotion: Ambulation Assistive Devices: Other (comment) (HHA) Ambulation/Gait Assistance: 4: Min assist Locomotion: Ambulation: 4: Travels 150 ft or more with minimal assistance (Pt.>75%)  Comprehension Comprehension Mode: Auditory Comprehension: 6-Follows complex conversation/direction: With extra time/assistive device  Expression Expression Mode: Verbal Expression: 6-Expresses complex ideas: With extra time/assistive device  Social Interaction Social Interaction: 6-Interacts appropriately with others with medication or extra time (anti-anxiety, antidepressant).  Problem Solving Problem Solving: 5-Solves basic problems: With no assist  Memory Memory: 5-Recognizes or recalls 90% of the time/requires cueing < 10% of the time  Medical Problem List and Plan:  1. Deconditioning following emergency aortic aneurysm repair and mediastinal exploration  2. 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 Monitor for any bleeding episodes, coumadin adjustment per pharmacy with avelox initiation. 3. Pain Management: Oxycodone as needed. Monitor with increased mobility  4. Neuropsych: This patient is capable of making decisions on his/her own behalf.  5. Acute renal failure secondary to contrast nephropathy and ATN. Hemodialysis initiated. Ongoing mgt per renal services.  6. Hypertension. Cardizem. 90 mg every 12 hours, amiodarone 200 mg twice a day. Lopressor initiated yesterday.     7. Postoperative anemia. Latest hemoglobin 8.0 Transfuse as needed. Patient denies any chest pain or increased shortness of breath. ?epo candidate 8. Shortness of Breath: cxr with RML consolidation. He's afebrile.  Dc avelox    -Recheck CXR done, this  appears to be more of an effusion than infiltrate  -will contact surgery for follow up  -robitussin for occasional cough  -O2 via [place]  -improved clinically today  LOS (Days) 3 A FACE TO FACE EVALUATION WAS PERFORMED  Oliviana Mcgahee T 11/19/2011, 8:18 AM

## 2011-11-19 NOTE — Progress Notes (Signed)
ANTICOAGULATION CONSULT NOTE - Follow Up Consult  Pharmacy Consult for Lovenox/Coumadin Indication: pulmonary embolus  No Known Allergies  Patient Measurements: Height: 5\' 8"  (172.7 cm) Weight: 227 lb 4.7 oz (103.1 kg) IBW/kg (Calculated) : 68.4   Vital Signs: Temp: 97.4 F (36.3 C) (10/14 0532) Temp src: Oral (10/14 0532) BP: 156/90 mmHg (10/14 0532) Pulse Rate: 76  (10/14 0532)  Labs:  Basename 11/19/11 0606 11/18/11 0248 11/17/11 1616 11/17/11 0625  HGB 8.1* 8.3* -- --  HCT 24.4* 24.6* -- 24.0*  PLT 261 229 -- 304  APTT -- -- -- --  LABPROT 22.6* 16.9* -- 16.8*  INR 2.09* 1.41 -- 1.40  HEPARINUNFRC -- <0.10* <0.10* <0.10*  CREATININE 5.27* 7.10* -- --  CKTOTAL -- -- -- --  CKMB -- -- -- --  TROPONINI -- -- -- --    Estimated Creatinine Clearance: 18.7 ml/min (by C-G formula based on Cr of 5.27).   Medications:  Scheduled:     . amiodarone  200 mg Oral BID  . calcium acetate  1,334 mg Oral TID WC  . diltiazem  90 mg Oral Q12H  . docusate sodium  200 mg Oral Daily  . enoxaparin (LOVENOX) injection  100 mg Subcutaneous Q24H  . metoprolol tartrate  12.5 mg Oral BID  . multivitamin  1 tablet Oral QHS  . pantoprazole  40 mg Oral Q1200  . psyllium  1 packet Oral Daily  . warfarin  4 mg Oral ONCE-1800  . Warfarin - Pharmacist Dosing Inpatient   Does not apply q1800  . DISCONTD: moxifloxacin  400 mg Oral Q2000  . DISCONTD: warfarin  6 mg Oral q1800   Infusions:    Assessment: 56 year old male s/p repair AAA 9/24, complicated post-op course including PE 9/28. Patient underwent emergent sternotomy removing sternal wires/cardiac tamponade 11/04/2011. A chest tube was placed. Patient was extubated 11/10/2011. Developed acute renal failure creatinine increased to 6.22 likely ATN from dye exposure during TAA repair and started on CVVHD.   Pt transitioned from heparin to lovenox yesterday and continues on warfarin. INR today is therapeutic at 2.09 after a large  increase (possibly d/t amiodarone interaction.) Pt will require 2 days of lovenox with a therapeutic INR. No bleeding noted, CBC is stable.  Goal of Therapy:  INR 2-3 Anti-Xa level 0.6-1.2 units/ml 4hrs after LMWH dose given Monitor platelets by anticoagulation protocol: Yes   Plan:  1. Continue lovenox 100mg  SQ Q24H - hopefully can DC tomorrow if INR remains therapeutic 2. Coumadin 4mg  PO x 1 tonight 3. F/u AM INR  Lysle Pearl, PharmD, BCPS Pager # [phone] 11/19/2011 11:09 AM

## 2011-11-19 NOTE — Progress Notes (Signed)
Occupational Therapy Session Note  Patient Details  Name: Gabriel Tran MRN: [id number] Date of Birth: [birth year]  Today's Date: 11/19/2011 Time: 7829-5621 Time Calculation (min): 32 min   Skilled Therapeutic Interventions/Progress Updates:    Worked on dynamic standing balance and endurance for greater independence with basic selfcare tasks.  Pt able to maintain standing balance for 8 mins without rest break.  O2 sats decreased to 92% on 2Ls nasal cannula.  Practiced stepping over the edge of the bath tub with min guard assist two times.  Pt's O2 sats decreased to 89-90% on room air with this. Dyspnea 3/4 also noted.  Educated pt on therapy putty exercises for gross digit flexion using yellow putty.  Pt currently with only 60% of full AROM for right hand digit flexion.  Can only oppose thumb to the first 2 digits.  Therapy Documentation Precautions:  Precautions Precautions: Sternal;Fall Precaution Comments: PE, acute renal failure; on dialysis, sternal precautions, on blood thinners Restrictions Weight Bearing Restrictions: No  Vital Signs: Therapy Vitals Patient Position, if appropriate: Standing Oxygen Therapy SpO2: 96 % (post ambulation) O2 Device: Nasal cannula O2 Flow Rate (L/min): 2 L/min Pulse Oximetry Type: Intermittent Pain: Pain Assessment Pain Assessment: No/denies pain  See FIM for current functional status  Therapy/Group: Individual Therapy  Giorgi Debruin OTR/L 11/19/2011, 3:38 PM

## 2011-11-19 NOTE — Progress Notes (Signed)
Patient information reviewed and entered into eRehab system by Jazaria Jarecki, RN, CRRN, PPS Coordinator.  Information including medical coding and functional independence measure will be reviewed and updated through discharge.    

## 2011-11-19 NOTE — Progress Notes (Signed)
Inpatient Rehabilitation Center Individual Statement of Services  Patient Name:  Gabriel Tran  Date:  11/19/2011  Welcome to the Inpatient Rehabilitation Center.  Our goal is to provide you with an individualized program based on your diagnosis and situation, designed to meet your specific needs.  With this comprehensive rehabilitation program, you will be expected to participate in at least 3 hours of rehabilitation therapies Monday-Friday, with modified therapy programming on the weekends.  Your rehabilitation program will include the following services:  Physical Therapy (PT), Occupational Therapy (OT), 24 hour per day rehabilitation nursing, Therapeutic Recreaction (TR), Psychology, Case Management (Social Worker), Rehabilitation Medicine, Nutrition Services and Pharmacy Services  Weekly team conferences will be held on Tuesdays to discuss your progress.  Your Social Worker will talk with you frequently to get your input and to update you on team discussions.  Team conferences with you and your family in attendance may also be held.  Expected length of stay: 10 days  Overall anticipated outcome: modified independent  Depending on your progress and recovery, your program may change.  Your  Social Worker will coordinate services and will keep you informed of any changes.  Your SW names and contact numbers are listed  below.  The following services may also be recommended but are not provided by the Inpatient Rehabilitation Center:   Driving Evaluations  Home Health Rehabiltiation Services  Outpatient Rehabilitatation [facility]  Vocational Rehabilitation   Arrangements will be made to provide these services after discharge if needed.  Arrangements include referral to agencies that provide these services.  Your insurance has been verified to be:  [place] Your primary doctor is:  Dr. Para March  Pertinent information will be shared with your doctor and your insurance company.  Social  Worker:  [place], [place] [phone] or [phone]  Information discussed with and copy given to patient by: Amada Jupiter, 11/19/2011, 2:24 PM

## 2011-11-19 NOTE — Progress Notes (Signed)
Physical Therapy Session Note  Patient Details  Name: Gabriel Tran MRN: [id number] Date of Birth: August 20, 1955  Today's Date: 11/19/2011 Time: 1478-2956 Time Calculation (min): 45 min  Short Term Goals: Week 1:  PT Short Term Goal 1 (Week 1): = LTG of mod I overall  Skilled Therapeutic Interventions/Progress Updates:    Ambulation x 100', 84' with supervision in controlled environment. Car transfer performed with supervision, cues for safe sequencing. Ambulation x 100', 150' working dynamic balance with head turns to alter visual input, pt required up to min assist for lateral loss of balance. Seated marching and manually resisted knee extension/flexion for strengthening 2 x 10 reps. Performed 3 x 7 reps sit <> stand for conditioning.   Pt tolerating 1/2 of therapy treatment without supplemental O2, SpO2 89-92% on room air however pt with 3/4 dyspnea with most activities.  Applied 2L [place] O2 and pt's SpO2 >95% throughout rest of treatment. Pt requires extended rest between each activity secondary to fatigue and dyspnea. HR 75-88 throughout entire session.   Therapy Documentation Precautions:  Precautions Precautions: Sternal;Fall Precaution Comments: PE, acute renal failure; on dialysis, sternal precautions, on blood thinners Restrictions Weight Bearing Restrictions: No Pain: Pain Assessment Pain Assessment: No/denies pain   See FIM for current functional status  Therapy/Group: Individual Therapy  Wilhemina Bonito 11/19/2011, 3:34 PM

## 2011-11-19 NOTE — Progress Notes (Signed)
  Echocardiogram 2D Echocardiogram has been performed.  Gabriel Tran 11/19/2011, 12:18 PM

## 2011-11-19 NOTE — Progress Notes (Signed)
Complained of feeling SOB at 0530, neb.treatment given. O2 sat 92%, O2 3l/m via [place]. Abdominal breathing noted. Pitting edema to BLE;s, BUE's. Talked with wife last HS about writing down fluid intake. Encouraged patient to use I.S.  And flutter valve. Without complain of anxiety or med for sleep. Alfredo Martinez A

## 2011-11-20 ENCOUNTER — Inpatient Hospital Stay (HOSPITAL_COMMUNITY): Payer: Managed Care, Other (non HMO) | Admitting: *Deleted

## 2011-11-20 ENCOUNTER — Inpatient Hospital Stay (HOSPITAL_COMMUNITY): Payer: PRIVATE HEALTH INSURANCE

## 2011-11-20 ENCOUNTER — Inpatient Hospital Stay (HOSPITAL_COMMUNITY): Payer: PRIVATE HEALTH INSURANCE | Admitting: Speech Pathology

## 2011-11-20 ENCOUNTER — Telehealth: Payer: Self-pay

## 2011-11-20 ENCOUNTER — Inpatient Hospital Stay (HOSPITAL_COMMUNITY): Payer: PRIVATE HEALTH INSURANCE | Admitting: Physical Therapy

## 2011-11-20 DIAGNOSIS — Z5189 Encounter for other specified aftercare: Secondary | ICD-10-CM

## 2011-11-20 DIAGNOSIS — N179 Acute kidney failure, unspecified: Secondary | ICD-10-CM

## 2011-11-20 DIAGNOSIS — R5381 Other malaise: Secondary | ICD-10-CM

## 2011-11-20 DIAGNOSIS — J9600 Acute respiratory failure, unspecified whether with hypoxia or hypercapnia: Secondary | ICD-10-CM

## 2011-11-20 LAB — GLUCOSE, CAPILLARY
Glucose-Capillary: 106 mg/dL — ABNORMAL HIGH (ref 70–99)
Glucose-Capillary: 108 mg/dL — ABNORMAL HIGH (ref 70–99)
Glucose-Capillary: 114 mg/dL — ABNORMAL HIGH (ref 70–99)
Glucose-Capillary: 144 mg/dL — ABNORMAL HIGH (ref 70–99)
Glucose-Capillary: 96 mg/dL (ref 70–99)

## 2011-11-20 LAB — RENAL FUNCTION PANEL
Albumin: 2.3 g/dL — ABNORMAL LOW (ref 3.5–5.2)
BUN: 32 mg/dL — ABNORMAL HIGH (ref 6–23)
Calcium: 8.5 mg/dL (ref 8.4–10.5)
Calcium: 8.6 mg/dL (ref 8.4–10.5)
Creatinine, Ser: 4.11 mg/dL — ABNORMAL HIGH (ref 0.50–1.35)
Creatinine, Ser: 4.95 mg/dL — ABNORMAL HIGH (ref 0.50–1.35)
GFR calc Af Amer: 18 mL/min — ABNORMAL LOW (ref >90)
GFR calc non Af Amer: 15 mL/min — ABNORMAL LOW (ref >90)
Glucose, Bld: 151 mg/dL — ABNORMAL HIGH (ref 70–99)
Phosphorus: 4.9 mg/dL — ABNORMAL HIGH (ref 2.3–4.6)
Phosphorus: 4.1 mg/dL (ref 2.3–4.6)
Potassium: 3.9 mEq/L (ref 3.5–5.1)
Sodium: 130 mEq/L — ABNORMAL LOW (ref 135–145)

## 2011-11-20 LAB — CBC
MCH: 28.6 pg (ref 26.0–34.0)
MCH: 28.6 pg (ref 26.0–34.0)
MCHC: 33.1 g/dL (ref 30.0–36.0)
MCV: 87.1 fL (ref 78.0–100.0)
MCV: 86.4 fL (ref 78.0–100.0)
Platelets: 269 10*3/uL (ref 150–400)
Platelets: 287 10*3/uL (ref 150–400)
RDW: 14.8 % (ref 11.5–15.5)
RDW: 14.8 % (ref 11.5–15.5)

## 2011-11-20 LAB — PROTIME-INR: Prothrombin Time: 24.3 seconds — ABNORMAL HIGH (ref 11.6–15.2)

## 2011-11-20 MED ORDER — DARBEPOETIN ALFA-POLYSORBATE 100 MCG/0.5ML IJ SOLN
INTRAMUSCULAR | Status: AC
  Filled 2011-11-20: qty 0.5

## 2011-11-20 MED ORDER — DARBEPOETIN ALFA-POLYSORBATE 100 MCG/0.5ML IJ SOLN
100.0000 ug | INTRAMUSCULAR | Status: DC
  Administered 2011-11-20 – 2011-11-27 (×2): 100 ug via INTRAVENOUS
  Filled 2011-11-20 (×2): qty 0.5

## 2011-11-20 MED ORDER — WARFARIN SODIUM 4 MG PO TABS
4.0000 mg | ORAL_TABLET | Freq: Once | ORAL | Status: DC
  Filled 2011-11-20: qty 1

## 2011-11-20 MED ORDER — HEPARIN SODIUM (PORCINE) 1000 UNIT/ML DIALYSIS
20.0000 [IU]/kg | INTRAMUSCULAR | Status: DC | PRN
  Administered 2011-11-24: 2000 [IU] via INTRAVENOUS_CENTRAL
  Filled 2011-11-20: qty 2

## 2011-11-20 MED ORDER — GLYCERIN (LAXATIVE) 2.1 G RE SUPP
1.0000 | Freq: Once | RECTAL | Status: AC
  Administered 2011-11-20: 06:00:00 via RECTAL
  Filled 2011-11-20: qty 1

## 2011-11-20 NOTE — Progress Notes (Signed)
Anticoagulation Consult Note: Coumadin  Pt was supposed to receive coumadin 4 mg tonight. However, an INR checked at 1700 showed that the INR was continuing to rise (maybe interaction with amiodarone) so the decision was made to hold the dose tonight and see what the INR is in the morning. 1700 INR=2.95                                      Goal INR is 2-2.5  Cardell Peach, PharmD

## 2011-11-20 NOTE — Telephone Encounter (Signed)
I am okay with either.  I would do what patient prefers.  Let me know if he wants me to follow or if he wants coumadin clinic.  Thanks.

## 2011-11-20 NOTE — Discharge Summary (Signed)
patient examined and medical record reviewed,agree with above note. VAN TRIGT III,Kathaleen Dudziak 11/20/2011    

## 2011-11-20 NOTE — Progress Notes (Signed)
Urinal given to patient and reviewed with patient rationale to monitor output and notify staff when using urinal . Reviewed with patient 1200cc fluid restrictions . Continue with plan of care .                 Gabriel Tran

## 2011-11-20 NOTE — Plan of Care (Signed)
Problem: RH BOWEL ELIMINATION Goal: RH STG MANAGE BOWEL WITH ASSISTANCE STG Manage Bowel with minimal assistance.  Outcome: Not Progressing Patient constipated last BM 11/18/11

## 2011-11-20 NOTE — Progress Notes (Signed)
ANTICOAGULATION CONSULT NOTE - Follow Up Consult  Pharmacy Consult for Coumadin Indication: pulmonary embolus  No Known Allergies  Patient Measurements: Height: 5\' 8"  (172.7 cm) Weight: 222 lb 10.6 oz (101 kg) IBW/kg (Calculated) : 68.4   Vital Signs: Temp: 97.4 F (36.3 C) (10/15 0500) Temp src: Oral (10/15 0500) BP: 140/78 mmHg (10/15 0827) Pulse Rate: 70  (10/15 0500)  Labs:  Basename 11/20/11 2130 11/19/11 0606 11/18/11 0248 11/17/11 1616  HGB 8.0* 8.1* -- --  HCT 24.4* 24.4* 24.6* --  PLT 269 261 229 --  APTT -- -- -- --  LABPROT 24.3* 22.6* 16.9* --  INR 2.30* 2.09* 1.41 --  HEPARINUNFRC -- -- <0.10* <0.10*  CREATININE 4.11* 5.27* 7.10* --  CKTOTAL -- -- -- --  CKMB -- -- -- --  TROPONINI -- -- -- --    Estimated Creatinine Clearance: 23.7 ml/min (by C-G formula based on Cr of 4.11).  Assessment: 56 year old male s/p repair AAA 9/24, complicated post-op course including PE 9/28. Patient underwent emergent sternotomy removing sternal wires/cardiac tamponade 11/04/2011. A chest tube was placed. Patient was extubated 11/10/2011. Developed acute renal failure creatinine increased to 6.22 likely ATN from dye exposure during TAA repair and started on CVVHD.   Pt transitioned from heparin to lovenox yesterday and continues on warfarin. Lovenox was stopped yesterday due to therapeutic INR. INR today remains therapeutic at 2.3 however, pt did not receive his dose last night (unclear why? RN and pt are not sure if he got it). INR has rapidly increased, likely due to amiodarone interaction but since pt missed his dose yesterday, INR might decrease. No bleeding noted, CBC is stable.  Goal of Therapy:  INR 2-2.5   Plan:  1. Coumadin 4mg  PO x 1 tonight 2. F/u AM INR  Lysle Pearl, PharmD, BCPS Pager # [phone] 11/20/2011 11:00 AM

## 2011-11-20 NOTE — Telephone Encounter (Signed)
Dan,PA at [facility] left v/m pt was to have aortic dissction repair and developed PE; is on Coumadin therapy. When pt scheduled for discharge will Dr Para March follow Coumadin or will coumadin clinic follow patient.Please advise.

## 2011-11-20 NOTE — Progress Notes (Signed)
Progressing postop emerg repair Type A ascending aortic dissection, postop PE, tamponade,afib , ATN on HD, deconditioned  Echo shows nl LV, effusion resolved INR goal 2.0-2.5 Incision clean- will remove every other staple Cont HD per renal Control HBP with clonidine,cardizem, lopressor,; watch HR

## 2011-11-20 NOTE — Evaluation (Addendum)
Speech Language Pathology Assessment and Plan  Patient Details  Name: ATANACIO DUFFORD MRN: [id number] Date of Birth: 20-May-1955  SLP Diagnosis: Cognitive Impairments  Rehab Potential: Good ELOS: 1 week   Today's Date: 11/20/2011 Time: 1510-1550 Time Calculation (min): 40 min  Problem List:  Patient Active Problem List  Diagnosis  . HYPERLIPIDEMIA  . ESSENTIAL HYPERTENSION, BENIGN  . PSORIASIS  . HYPERGLYCEMIA  . Routine general medical examination at a health care facility  . Back pain  . HTN (hypertension), malignant  . Chest pain  . Leukocytosis  . RBBB  . Aortic dissection  . Acute respiratory failure  . Hypoxemia  . A-fib  . Physical deconditioning   Past Medical History:  Past Medical History  Diagnosis Date  . Hypertension   . Hyperglycemia     a. A1C 5.9 in Sept 2013.  Marland Kitchen RBBB    Past Surgical History:  Past Surgical History  Procedure Date  . Knee surgery     LEFT 1981  . Carpal tunnel release     2004 RIGHT  . Wisdom tooth extraction   . Thoracic aortic aneurysm repair 10/30/2011    Procedure: THORACIC ASCENDING ANEURYSM REPAIR (AAA);  Surgeon: Kerin Perna, MD;  Location: [facility] OR;  Service: Open Heart Surgery;  Laterality: N/A;  Ascending aortic dissection repair, arch reconstruction, aortic valve repair  . Insertion of dialysis catheter 11/06/2011    Procedure: INSERTION OF DIALYSIS CATHETER;  Surgeon: Kerin Perna, MD;  Location: [facility] OR;  Service: Thoracic;  Laterality: Right;  . Mediastinal exploration 11/06/2011    Procedure: MEDIASTINAL EXPLORATION;  Surgeon: Kerin Perna, MD;  Location: [facility] OR;  Service: Thoracic;  Laterality: N/A;  sternal closure; removal of wound vac  . Insertion of dialysis catheter 11/13/2011    Procedure: INSERTION OF DIALYSIS CATHETER;  Surgeon: Sherren Kerns, MD;  Location: [facility] OR;  Service: Vascular;  Laterality: N/A;  Ultrasound guided.    Assessment / Plan / Recommendation Clinical Impression  Gabriel Tran is a 56 y.o. right-handed male ex-smoker as well as history of hypertension admitted 10/29/2011 with chest pain radiating to the neck. Patient was active and independent prior to admission. Noted blood pressure 230/110. EKG revealed right bundle branch block. Chest x-ray showed no acute findings. Cardiac enzymes were negative. Cranial CT scan showed no evidence of acute abnormality. Patient found to have acute type A. ascending aortic dissection with moderate aortic insufficiency. Underwent emergent repair of ascending aortic dissection 11/01/2011. Hospital course with increasing shortness of breath ventilation perfusion scan completed showing intermediate probability for pulmonary embolism. He was placed on low-dose heparin infusion without bolus. Patient became more agitated and developed pulseless electrical activity became hypotensive immediately given CPR and was intubated. Patient underwent emergent sternotomy removing sternal wires/cardiac tamponade 11/04/2011. He is tolerating a regular consistency and thin liquid diet. Patient with sternal precautions. Patient transferred to [facility] 11/16/11 and SLP evaluation ordered 11/19/11 to asses high level cognition. Upon evaluation today patient presents with mild high level cognitive deficits.  Deficits are characterized by decreased processing speed, divided attention, anticipatory awareness, complex problem solving and recall of new information.  As a result, it is recommended that he receive skilled SLP services during CIR to maximize functional independence and reduce burden of care upon discharge.       SLP Assessment  Patient will need skilled Speech Lanaguage Pathology Services during CIR admission    Recommendations  Follow up Recommendations: 24 hour supervision/assistance;Outpatient SLP  Equipment Recommended: None recommended by SLP    SLP Frequency 1-2 X/day, 30-60 minutes;3 out of 7 days   SLP  Treatment/Interventions Cognitive remediation/compensation;Cueing hierarchy;Environmental controls;Functional tasks;Internal/external aids;Medication managment;Therapeutic Activities    Pain Pain Assessment Pain Assessment: No/denies pain Prior Functioning Cognitive/Linguistic Baseline: Within functional limits  Short Term Goals: Week 1: SLP Short Term Goal 1 (Week 1): short term goals = long term goals  See FIM for current functional status Refer to Care Plan for Long Term Goals  Recommendations for other services: Team consider Neuropsych  Discharge Criteria: Patient will be discharged from SLP if patient refuses treatment 3 consecutive times without medical reason, if treatment goals not met, if there is a change in medical status, if patient makes no progress towards goals or if patient is discharged from hospital.  The above assessment, treatment plan, treatment alternatives and goals were discussed and mutually agreed upon: by patient  Fae Pippin, M.A., CCC-SLP [phone]  Tyreak Reagle 11/20/2011, 4:06 PM

## 2011-11-20 NOTE — Progress Notes (Signed)
Subjective:  Dialysis yesterday well tolerated 5 liters off Breathing easier but still SOB Tolerating therapies Finally had a bowel movement  Objective:    Vital signs in last 24 hours: Filed Vitals:   11/19/11 2224 11/19/11 2228 11/20/11 0500 11/20/11 0827  BP: 147/82  158/65 140/78  Pulse:  90 70   Temp:   97.4 F (36.3 C)   TempSrc:   Oral   Resp:   19   Height:      Weight:   101 kg (222 lb 10.6 oz)   SpO2:   96%    Weight change: -1.4 kg (-3 lb 1.4 oz)  Intake/Output Summary (Last 24 hours) at 11/20/11 1012 Last data filed at 11/20/11 0830  Gross per 24 hour  Intake      0 ml  Output   5002 ml  Net  -5002 ml  11/20/11 101 kg  AM  11/19/11 100.3 kg POST DIALYSIS 11/19/11 105.2 kg PRE DIALYSS   Physical Exam:  Blood pressure 140/78, pulse 70, temperature 97.4 F (36.3 C), temperature source Oral, resp. rate 19, height 5\' 8"  (1.727 m), weight 101 kg (222 lb 10.6 oz), SpO2 96.00%. Still working some to breathe when lying flat - using abdonimal muscles Diminished breath sounds at lung bases bilat right more so than the left Sternal sound clean Protuberant obese abdomen 2+ edema bilat LE's Perm cath with dressing in place   Lab 11/20/11 0635 11/19/11 0606 11/18/11 0248 11/16/11 0500 11/15/11 0429 11/14/11 0439  NA 130* 125* 120* 126* 130* 126*  K 3.9 4.5 5.5* 4.9 4.7 4.8  CL 94* 88* 85* 88* 96 92*  CO2 28 24 21 20 23  18*  GLUCOSE 115* 124* 100* 162* 150* 106*  BUN 24* 39* 60* 68* 45* 72*  CREATININE 4.11* 5.27* 7.10* 7.75* 5.22* 6.77*  ALB -- -- -- -- -- --  CALCIUM 8.5 8.4 8.9 8.9 8.7 8.8  PHOS 4.9* 5.7* 7.1* 7.8* 5.9* 8.4*     Lab 11/20/11 0635 11/19/11 0606 11/18/11 0248 11/16/11 0500 11/15/11 0429  AST -- -- -- 28 35  ALT -- -- -- 86* 111*  ALKPHOS -- -- -- 126* 143*  BILITOT -- -- -- 0.9 1.0  PROT -- -- -- 6.2 6.0  ALBUMIN 2.3* 2.3* 2.2* -- --   Lab 11/20/11 4401 11/19/11 0606 11/18/11 0248 11/17/11 0625  WBC 7.2 8.9 11.8* 10.3  NEUTROABS -- --  -- --  HGB 8.0* 8.1* 8.3* 8.0*  HCT 24.4* 24.4* 24.6* 24.0*  MCV 87.1 86.2 84.8 86.0  PLT 269 261 229 304    Lab 11/20/11 0709 11/20/11 0012 11/19/11 2241 11/19/11 1232 11/18/11 2224  GLUCAP 114* 144* 162* 111* 123*      Lab 11/19/11 1337  IRON --  TIBC --  TRANSFERRIN --  FERRITIN 870*    Studies/Results: Dg Chest 2 View  11/19/2011  *RADIOLOGY REPORT*  Clinical Data: Pleural effusions.  CHEST - 2 VIEW  Comparison: November 18, 2011.  Findings: Sternotomy wires are again noted.  No change is noted in position of right internal jugular dialysis catheter.  No change is noted in bilateral upper lobe nodular densities as described above. Mild bilateral pleural effusions are noted which are not significantly changed.  No pneumothorax is noted.  IMPRESSION: Mild bilateral pleural effusions which are not significantly changed compared to prior exam.   Original Report Authenticated By: Venita Sheffield., M.D.    Medications: . amiodarone  200 mg Oral BID  .  bisacodyl  10 mg Rectal Once  . calcium acetate  1,334 mg Oral TID WC  . cloNIDine  0.1 mg Oral BID  . darbepoetin (ARANESP) injection - DIALYSIS  100 mcg Intravenous Q Tue-HD  . diltiazem  90 mg Oral Q12H  . docusate sodium  200 mg Oral Daily  . Glycerin (Adult)  1 suppository Rectal Once  . metoprolol tartrate  25 mg Oral BID  . multivitamin  1 tablet Oral QHS  . pantoprazole  40 mg Oral Q1200  . psyllium  1 packet Oral Daily  . sodium phosphate  1 enema Rectal Once  . warfarin  4 mg Oral ONCE-1800  . Warfarin - Pharmacist Dosing Inpatient   Does not apply q1800  . DISCONTD: enoxaparin (LOVENOX) injection  100 mg Subcutaneous Q24H  . DISCONTD: metoprolol tartrate  12.5 mg Oral BID  . DISCONTD: warfarin  6 mg Oral q1800     I  have reviewed scheduled and prn medications.  ASSESSMENT/RECOMMENDATIONS 1. S/P emergency repair type A ascending aortic dissection 10/30/11 with emergency sternotomy for tamponade 9/28, sternal  closure 10/1   2. Dialysis dependent AKI in the setting of #1 plus contrast plus ACE inhibitor Renal arteries were patent on contrasted imaging  Dialysis dependent since 11/06/11 (initially CRRT then transition to HD)  No evidence yet of renal recovery but I/O not documented Creatinine drop reflects daily HD X 2 for volume HD today to get on outpt schedule (if no recovery before time of discharge) Fluid restrict   3. PE on Coumadin   4. Atrial arrythmias on cardizem   5. Anemia - Iron studies pending Start Darbe 100 Q Tuesday with HD   6. CKD-MBD - binders NEED TO AVOID FURTHER USE OF FLEETS ENEMAS FOR CONSTIPATION DUE TO HIGH PHOSPHORUS CONTENT!!!    Camille Bal, MD [facility] [phone] Pager 11/20/2011, 10:12 AM

## 2011-11-20 NOTE — Progress Notes (Signed)
Subjective/Complaints: Feels that breathing better. Slept better. Constipated A 12 point review of systems has been performed and if not noted above is otherwise negative.   Objective: Vital Signs: Blood pressure 158/65, pulse 70, temperature 97.4 F (36.3 C), temperature source Oral, resp. rate 19, height 5\' 8"  (1.727 m), weight 101 kg (222 lb 10.6 oz), SpO2 96.00%. Dg Chest 2 View  11/19/2011  *RADIOLOGY REPORT*  Clinical Data: Pleural effusions.  CHEST - 2 VIEW  Comparison: November 18, 2011.  Findings: Sternotomy wires are again noted.  No change is noted in position of right internal jugular dialysis catheter.  No change is noted in bilateral upper lobe nodular densities as described above. Mild bilateral pleural effusions are noted which are not significantly changed.  No pneumothorax is noted.  IMPRESSION: Mild bilateral pleural effusions which are not significantly changed compared to prior exam.   Original Report Authenticated By: Venita Sheffield., M.D.     Basename 11/20/11 0635 11/19/11 0606  WBC 7.2 8.9  HGB 8.0* 8.1*  HCT 24.4* 24.4*  PLT 269 261    Basename 11/19/11 0606 11/18/11 0248  NA 125* 120*  K 4.5 5.5*  CL 88* 85*  CO2 24 21  GLUCOSE 124* 100*  BUN 39* 60*  CREATININE 5.27* 7.10*  CALCIUM 8.4 8.9   CBG (last 3)   Basename 11/20/11 0709 11/20/11 0012 11/19/11 2241  GLUCAP 114* 144* 162*    Wt Readings from Last 3 Encounters:  11/20/11 101 kg (222 lb 10.6 oz)  11/16/11 103.7 kg (228 lb 9.9 oz)  11/16/11 103.7 kg (228 lb 9.9 oz)    Physical Exam:  Constitutional: He is oriented to person, place, and time. He appears well-developed.  HENT: mucosa pink and moist  Head: Normocephalic.  Eyes:  Pupils round and reactive to light  Neck: Neck supple. No thyromegaly present.  Cardiovascular: Normal rate and regular rhythm.  Pulmonary/Chest: decreased breath sounds in right lower half of lung. He has no wheezes. Appears more comfortable today. Abdominal:  Soft. Bowel sounds are normal. He exhibits no distension.  Neurological: He is alert and oriented to person, place, and time. resasonable memory, insight, and awareness.  Follows three-step commands. Strength grossly 4/5 in the upper ext and 3-4 in the lower ext's  Skin:  Chest tube out. Area clean and dressed. Chest/abd incision clean and intact with staples. Mild drainage only from RLQ wound. Psychiatric: He has a normal mood and affect.  Reduced sensation right second third digits seems improved.    Assessment/Plan: 1. Functional deficits secondary to deconditioning after aortic aneurysm repair/mediastinal exploration, multiple medical issues which require 3+ hours per day of interdisciplinary therapy in a comprehensive inpatient rehab setting. Physiatrist is providing close team supervision and 24 hour management of active medical problems listed below. Physiatrist and rehab team continue to assess barriers to discharge/monitor patient progress toward functional and medical goals. FIM: FIM - Bathing Bathing Steps Patient Completed: Chest;Right Arm;Abdomen;Front perineal area;Buttocks;Right upper leg Bathing: 3: Mod-Patient completes 5-7 75f 10 parts or 50-74%  FIM - Upper Body Dressing/Undressing Upper body dressing/undressing steps patient completed: Thread/unthread right sleeve of front closure shirt/dress;Thread/unthread left sleeve of front closure shirt/dress;Pull shirt around back of front closure shirt/dress Upper body dressing/undressing: 4: Min-Patient completed 75 plus % of tasks FIM - Lower Body Dressing/Undressing Lower body dressing/undressing steps patient completed: Thread/unthread right underwear leg;Thread/unthread right pants leg;Thread/unthread left pants leg;Pull pants up/down Lower body dressing/undressing: 2: Max-Patient completed 25-49% of tasks  FIM - Toileting  Toileting steps completed by patient: Adjust clothing prior to toileting;Performs perineal  hygiene;Adjust clothing after toileting Toileting Assistive Devices: Grab bar or rail for support Toileting: 4: Steadying assist  FIM - Diplomatic Services operational officer Devices: Elevated toilet seat Toilet Transfers: 4-To toilet/BSC: Min A (steadying Pt. > 75%);4-From toilet/BSC: Min A (steadying Pt. > 75%)  FIM - Bed/Chair Transfer Bed/Chair Transfer: 3: Sit > Supine: Mod A (lifting assist/Pt. 50-74%/lift 2 legs);5: Bed > Chair or W/C: Supervision (verbal cues/safety issues);5: Chair or W/C > Bed: Supervision (verbal cues/safety issues)  FIM - Locomotion: Wheelchair Distance: Total A secondary to sternal precautions Locomotion: Wheelchair: 0: Activity did not occur (ambulation primary means of locomotion ) FIM - Locomotion: Ambulation Locomotion: Ambulation Assistive Devices: Designer, industrial/product Ambulation/Gait Assistance: 4: Min assist Locomotion: Ambulation: 4: Travels 150 ft or more with minimal assistance (Pt.>75%)  Comprehension Comprehension Mode: Auditory Comprehension: 6-Follows complex conversation/direction: With extra time/assistive device  Expression Expression Mode: Verbal Expression: 6-Expresses complex ideas: With extra time/assistive device  Social Interaction Social Interaction: 6-Interacts appropriately with others with medication or extra time (anti-anxiety, antidepressant).  Problem Solving Problem Solving: 5-Solves complex 90% of the time/cues < 10% of the time  Memory Memory: 5-Recognizes or recalls 90% of the time/requires cueing < 10% of the time  Medical Problem List and Plan:  1. Deconditioning following emergency aortic aneurysm repair and mediastinal exploration  2. 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 Monitor for any bleeding episodes, coumadin adjustment per pharmacy with avelox initiation. 3. Pain Management: Oxycodone as needed. Monitor with increased mobility  4. Neuropsych: This  patient is capable of making decisions on his/her own behalf.  5.Renal failure secondary to contrast nephropathy and ATN. Hemodialysis initiated. Ongoing mgt per renal services.  6. Hypertension. Cardizem. 90 mg every 12 hours, amiodarone 200 mg twice a day. Lopressor initiated yesterday.     7. Postoperative anemia. Latest hemoglobin 8.0 Transfuse as needed. Patient denies any chest pain or increased shortness of breath. ?epo candidate 8. Shortness of Breath:      -follow up CXR showed improvement  -surgery following  -better after HD  -robitussin for occasional cough  -O2 via [place]  -improved clinically today 9. Constipation: still no results with multiple meds  -SSE today  LOS (Days) 4 A FACE TO FACE EVALUATION WAS PERFORMED  Tyquan Carmickle T 11/20/2011, 7:18 AM

## 2011-11-20 NOTE — Progress Notes (Signed)
Physical Therapy Session Note  Patient Details  Name: ALEKSEY BLACKBURN MRN: [id number] Date of Birth: [birth year]  Today's Date: 11/20/2011 Time: 1000-1055 Time Calculation (min): 55 min  Short Term Goals: Week 1:  PT Short Term Goal 1 (Week 1): = LTG of mod I overall  Skilled Therapeutic Interventions/Progress Updates:    Pt fatigued at start, agreeable to therapy. Cues needed with bed mobility to adhere to sternal precautions as pt tried to pull on railing. Ambulation to/from  bathroom performed x 2 reps with supervision for safety. Ambulation x 150', 150', 120' with supervision performed over carpeted and tile surfaces for endurance and dynamic balance. Pt practicing multi-directional stepping while pointing to plants to be watered by therapist.Pt requires extended rest with each activity.   SpO2 = >93% on 1L [place] O2.   Therapy Documentation Precautions:  Precautions Precautions: Sternal;Fall Precaution Comments: PE, acute renal failure; on dialysis, sternal precautions, on blood thinners Restrictions Weight Bearing Restrictions: No Pain: Pain Assessment Pain Assessment: No/denies pain  See FIM for current functional status  Therapy/Group: Individual Therapy  Wilhemina Bonito 11/20/2011, 12:02 PM

## 2011-11-20 NOTE — Telephone Encounter (Signed)
LMOVM

## 2011-11-20 NOTE — Progress Notes (Signed)
Occupational Therapy Session Note  Patient Details  Name: Gabriel Tran MRN: [id number] Date of Birth: [birth year]  Today's Date: 11/20/2011 Time: 3086-5784 Time Calculation (min): 45 min  Short Term Goals: Week 1:  OT Short Term Goal 1 (Week 1): Pt.  will bathe LB with supervision while standing OT Short Term Goal 2 (Week 1): Pt. will dress LB while standing with supervision OT Short Term Goal 3 (Week 1): Pt. will perform breathing strategies with 1 questioning cue.   OT Short Term Goal 4 (Week 1): Pt. will transfer to toilet with mod I OT Short Term Goal 5 (Week 1): Pt. will perform light household task for 10 mins. at standing level at mod I level.    Skilled Therapeutic Interventions/Progress Updates:    Pt attempting to sit EOB upon arrival.  Pt stated he needed to have bowel movement.  Pt amb without AD to bathroom for toileting.  Pt transitioned to bathing and dressing tasks with sit<>stand from chair at sink.  Pt requires extra time to complete tasks with rest breaks between segments of task.  O2 sats >90% on RA throughout session with one exception (87% but recovered within 30 secs using pursed lip breathing techniques).  Pt completed all tasks with supervision.  Pt requested to lay back down in bed to rest before next therapy.  Pt missed 15 mins therapy.  Therapy Documentation Precautions:  Precautions Precautions: Sternal;Fall Precaution Comments: PE, acute renal failure; on dialysis, sternal precautions, on blood thinners Restrictions Weight Bearing Restrictions: No General: General Amount of Missed OT Time (min): 15 Minutes Pain: Pain Assessment Pain Assessment: No/denies pain  See FIM for current functional status  Therapy/Group: Individual Therapy  Rich Brave 11/20/2011, 9:49 AM

## 2011-11-20 NOTE — Evaluation (Signed)
Recreational Therapy Assessment and Plan  Patient Details  Name: Gabriel Tran MRN: [id number] Date of Birth: 31-Aug-1955 Today's Date: 11/20/2011  Rehab Potential: Good ELOS: 2 weeks   Assessment Clinical Impression:Problem List:  Patient Active Problem List   Diagnosis   .  HYPERLIPIDEMIA   .  ESSENTIAL HYPERTENSION, BENIGN   .  PSORIASIS   .  HYPERGLYCEMIA   .  Routine general medical examination at a health care facility   .  Back pain   .  HTN (hypertension), malignant   .  Chest pain   .  Leukocytosis   .  RBBB   .  Aortic dissection   .  Acute respiratory failure   .  Hypoxemia   .  A-fib    Past Medical History:  Past Medical History   Diagnosis  Date   .  Hypertension    .  Hyperglycemia      a. A1C 5.9 in Sept 2013.   Marland Kitchen  RBBB     Past Surgical History:  Past Surgical History   Procedure  Date   .  Knee surgery      LEFT 1981   .  Carpal tunnel release      2004 RIGHT   .  Wisdom tooth extraction    .  Thoracic aortic aneurysm repair  10/30/2011     Procedure: THORACIC ASCENDING ANEURYSM REPAIR (AAA); Surgeon: Kerin Perna, MD; Location[facility] OR; Service: Open Heart Surgery; Laterality: N/A; Ascending aortic dissection repair, arch reconstruction, aortic valve repair   .  Insertion of dialysis catheter  11/06/2011     Procedure: INSERTION OF DIALYSIS CATHETER; Surgeon: Kerin Perna, MD; Location: [facility] OR; Service: Thoracic; Laterality: Right;   .  Mediastinal exploration  11/06/2011     Procedure: MEDIASTINAL EXPLORATION; Surgeon: Kerin Perna, MD; Location: [facility] OR; Service: Thoracic; Laterality: N/A; sternal closure; removal of wound vac   .  Insertion of dialysis catheter  11/13/2011     Procedure: INSERTION OF DIALYSIS CATHETER; Surgeon: Sherren Kerns, MD; Location[facility] OR; Service: Vascular; Laterality: N/A; Ultrasound guided.    Assessment & Plan  Clinical Impression: Patient is a 56 y.o. right-handed male ex smoker as well as history of  hypertension admitted 10/29/2011 with chest pain radiating to the neck. Patient was active and independent prior to admission. Noted blood pressure 230/110. EKG revealed right bundle branch block. Chest x-ray showed no acute findings. Cardiac enzymes were negative. Cranial CT scan showed no evidence of acute abnormality. Patient found to have acute type A. ascending aortic dissection with moderate aortic insufficiency. Underwent emergent repair of ascending aortic dissection 11/01/2011. Hospital course with increasing shortness of breath ventilation perfusion scan completed showing intermediate probability for pulmonary embolism. He was placed on low-dose heparin infusion without bolus. Patient became more agitated and developed pulseless electrical activity became hypotensive immediately given CPR and was intubated. Patient underwent emergent sternotomy removing sternal wires/cardiac tamponade 11/04/2011. A chest tube was placed. Patient was extubated 11/10/2011. Developed acute renal failure and started on CVVHD. Nephrology continues to follow with permanent dialysis catheter placement and hemodialysis initiated 11/14/2011. Coumadin has since been initiated for pulmonary emboli and monitoring for any bleeding episodes. He is tolerating a regular consistency diet. Patient received education on sternal precautions. Patient transferred to CIR on 11/16/2011 .    Pt presents with decreased activity tolerance, decreased functional mobility, decreased balance limiting pt's independence with leisure/community pursuits. Leisure History/Participation Premorbid leisure interest/current participation: Gabriel Tran -  Fishing;Community - Travel (Comment);Community - Press photographer - Grocery store;Sports - Exercise (Comment) (bowling) Other Leisure Interests: Television;Reading;Computer;Cooking/Baking Leisure Participation Style: With Family/Friends Awareness of Community Resources: Good-identify 3 post discharge  leisure resources Psychosocial / Spiritual Spiritual Interests: Church Patient agreeable to Pet Therapy: Yes Does patient have pets?: Yes (jack russsell, charlie) Social interaction - Mood/Behavior: Cooperative Firefighter Appropriate for Education?: Yes Patient Agreeable to [employer]?: Yes Recreational Therapy Orientation Orientation -Reviewed with patient: Available activity resources Strengths/Weaknesses Patient Strengths/Abilities: Willingness to participate;Active premorbidly Patient weaknesses: Physical limitations  Plan Rec Therapy Plan Is patient appropriate for Therapeutic Recreation?: Yes Rehab Potential: Good Treatment times per week: Min 1 time per week .20 minutes Estimated Length of Stay: 2 weeks TR Treatment/Interventions: Adaptive equipment instruction;1:1 session;Balance/vestibular training;Functional mobility training;Community reintegration;Patient/family education;Recreation/leisure participation;Therapeutic activities;UE/LE Coordination activities  Recommendations for other services: None  Discharge Criteria: Patient will be discharged from TR if patient refuses treatment 3 consecutive times without medical reason.  If treatment goals not met, if there is a change in medical status, if patient makes no progress towards goals or if patient is discharged from hospital.  The above assessment, treatment plan, treatment alternatives and goals were discussed and mutually agreed upon: by patient  Gabriel Tran 11/20/2011, 12:05 PM

## 2011-11-20 NOTE — Progress Notes (Addendum)
Occupational Therapy Note  Patient Details  Name: Gabriel Tran MRN: [id number] Date of Birth: 06-12-1955 Today's Date: 11/20/2011  Time:  1100-1130 Pt denies pain Individual therapy Pt practiced stepping over into tub without use of grab bars X 3.  Recommended tub seat for after discharge.  Pt stated he would check with family to see if one was available.  Pt amb in ADL apartment for home mgmt tasks.  Pt O2 sats >90% on RA for approx 20 mins of session.  Pt demonstrated use of theraputty for right hand to increase function and dexterity to assist with ADLs.  Focus on activity tolerance, safety awareness, and dynamic standing balance.   Lavone Neri [facility] 11/20/2011, 1:39 PM

## 2011-11-20 NOTE — Progress Notes (Signed)
Patient had 3 small to moderate formed bowel movements this am . Attempted to give patient soap suds enema this am at 0750 . Patient refused until after therapy and then taken to hemodialysis .  Reviewed with patient benefit of taking enema . Patient verbalized understanding . Continue with plan of care .                  Cleotilde Neer

## 2011-11-21 ENCOUNTER — Inpatient Hospital Stay (HOSPITAL_COMMUNITY): Payer: Managed Care, Other (non HMO) | Admitting: Physical Therapy

## 2011-11-21 ENCOUNTER — Inpatient Hospital Stay (HOSPITAL_COMMUNITY): Payer: PRIVATE HEALTH INSURANCE

## 2011-11-21 ENCOUNTER — Inpatient Hospital Stay (HOSPITAL_COMMUNITY): Payer: PRIVATE HEALTH INSURANCE | Admitting: Speech Pathology

## 2011-11-21 ENCOUNTER — Inpatient Hospital Stay (HOSPITAL_COMMUNITY): Payer: PRIVATE HEALTH INSURANCE | Admitting: *Deleted

## 2011-11-21 LAB — GLUCOSE, CAPILLARY
Glucose-Capillary: 124 mg/dL — ABNORMAL HIGH (ref 70–99)
Glucose-Capillary: 115 mg/dL — ABNORMAL HIGH (ref 70–99)

## 2011-11-21 LAB — PROTIME-INR: INR: 2.08 — ABNORMAL HIGH (ref 0.00–1.49)

## 2011-11-21 MED ORDER — DOCUSATE SODIUM 100 MG PO CAPS
200.0000 mg | ORAL_CAPSULE | Freq: Two times a day (BID) | ORAL | Status: DC
  Administered 2011-11-21 – 2011-11-27 (×12): 200 mg via ORAL
  Filled 2011-11-21 (×15): qty 2

## 2011-11-21 MED ORDER — SODIUM CHLORIDE 0.9 % IV SOLN
125.0000 mg | INTRAVENOUS | Status: DC
  Administered 2011-11-22 – 2011-11-27 (×3): 125 mg via INTRAVENOUS
  Filled 2011-11-21 (×5): qty 10

## 2011-11-21 MED ORDER — WARFARIN SODIUM 4 MG PO TABS
4.0000 mg | ORAL_TABLET | Freq: Once | ORAL | Status: AC
  Administered 2011-11-21: 4 mg via ORAL
  Filled 2011-11-21: qty 1

## 2011-11-21 NOTE — Progress Notes (Signed)
BP controlled on current meds, NSR INR - coumadin well controled by Pharm D - thanks Waiting for return of renal fx CXR clear post HD Incisions healing, remove remaining skin staples later in week

## 2011-11-21 NOTE — Progress Notes (Signed)
Subjective/Complaints: Fair night. Finally moved bowels yesterday  A 12 point review of systems has been performed and if not noted above is otherwise negative.   Objective: Vital Signs: Blood pressure 158/84, pulse 65, temperature 98.4 F (36.9 C), temperature source Oral, resp. rate 19, height 5\' 8"  (1.727 m), weight 104.5 kg (230 lb 6.1 oz), SpO2 96.00%. No results found.  Basename 11/20/11 1705 11/20/11 0635  WBC 10.4 7.2  HGB 8.0* 8.0*  HCT 24.2* 24.4*  PLT 287 269    Basename 11/20/11 1705 11/20/11 0635  NA 126* 130*  K 3.9 3.9  CL 92* 94*  CO2 25 28  GLUCOSE 151* 115*  BUN 32* 24*  CREATININE 4.95* 4.11*  CALCIUM 8.6 8.5   CBG (last 3)   Basename 11/20/11 2135 11/20/11 1604 11/20/11 1118  GLUCAP 96 108* 106*    Wt Readings from Last 3 Encounters:  11/21/11 104.5 kg (230 lb 6.1 oz)  11/16/11 103.7 kg (228 lb 9.9 oz)  11/16/11 103.7 kg (228 lb 9.9 oz)    Physical Exam:  Constitutional: He is oriented to person, place, and time. He appears well-developed.  HENT: mucosa pink and moist  Head: Normocephalic.  Eyes:  Pupils round and reactive to light  Neck: Neck supple. No thyromegaly present.  Cardiovascular: Normal rate and regular rhythm.  Pulmonary/Chest: decreased breath sounds in right lower half of lung. He has no wheezes. Appears more comfortable today. Abdominal: Soft. Bowel sounds are normal. He exhibits no distension.  Neurological: He is alert and oriented to person, place, and time. resasonable memory, insight, and awareness.  Follows three-step commands. Strength grossly 4/5 in the upper ext and 3-4 in the lower ext's  Skin:  Chest tube out. Area clean and dressed. Chest/abd incision clean and intact with staples. Mild drainage only from RLQ wound. Psychiatric: He has a normal mood and affect.  Reduced sensation right second third digits seems improved.    Assessment/Plan: 1. Functional deficits secondary to deconditioning after aortic  aneurysm repair/mediastinal exploration, multiple medical issues which require 3+ hours per day of interdisciplinary therapy in a comprehensive inpatient rehab setting. Physiatrist is providing close team supervision and 24 hour management of active medical problems listed below. Physiatrist and rehab team continue to assess barriers to discharge/monitor patient progress toward functional and medical goals. FIM: FIM - Bathing Bathing Steps Patient Completed: Chest;Right Arm;Left Arm;Abdomen;Front perineal area;Buttocks;Right upper leg;Left upper leg Bathing: 4: Min-Patient completes 8-9 62f 10 parts or 75+ percent  FIM - Upper Body Dressing/Undressing Upper body dressing/undressing steps patient completed: Thread/unthread right sleeve of pullover shirt/dresss;Thread/unthread left sleeve of pullover shirt/dress;Put head through opening of pull over shirt/dress;Pull shirt over trunk Upper body dressing/undressing: 5: Set-up assist to: Obtain clothing/put away FIM - Lower Body Dressing/Undressing Lower body dressing/undressing steps patient completed: Thread/unthread right underwear leg;Thread/unthread left underwear leg;Pull underwear up/down;Don/Doff right shoe;Don/Doff left sock Lower body dressing/undressing: 5: Supervision: Safety issues/verbal cues  FIM - Toileting Toileting steps completed by patient: Adjust clothing prior to toileting;Performs perineal hygiene;Adjust clothing after toileting Toileting Assistive Devices: Grab bar or rail for support Toileting: 5: Supervision: Safety issues/verbal cues  FIM - Diplomatic Services operational officer Devices: Grab bars Toilet Transfers: 5-To toilet/BSC: Supervision (verbal cues/safety issues)  FIM - Games developer Transfer: 4: Chair or W/C > Bed: Min A (steadying Pt. > 75%);4: Bed > Chair or W/C: Min A (steadying Pt. > 75%);4: Sit > Supine: Min A (steadying pt. > 75%/lift 1 leg);5: Supine > Sit: Supervision (verbal  cues/safety issues)  FIM - Locomotion: Wheelchair Distance: Total A secondary to sternal precautions Locomotion: Wheelchair: 0: Activity did not occur (ambulation primary means of locomotion ) FIM - Locomotion: Ambulation Locomotion: Ambulation Assistive Devices: Designer, industrial/product Ambulation/Gait Assistance: 5: Supervision Locomotion: Ambulation: 5: Travels 150 ft or more with supervision/safety issues  Comprehension Comprehension Mode: Auditory Comprehension: 6-Follows complex conversation/direction: With extra time/assistive device  Expression Expression Mode: Verbal Expression: 6-Expresses complex ideas: With extra time/assistive device  Social Interaction Social Interaction: 6-Interacts appropriately with others with medication or extra time (anti-anxiety, antidepressant).  Problem Solving Problem Solving: 6-Solves complex problems: With extra time  Memory Memory: 5-Recognizes or recalls 90% of the time/requires cueing < 10% of the time  Medical Problem List and Plan:  1. Deconditioning following emergency aortic aneurysm repair and mediastinal exploration  2. 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 Monitor for any bleeding episodes, coumadin adjustment per pharmacy with avelox initiation. 3. Pain Management: Oxycodone as needed. Monitor with increased mobility  4. Neuropsych: This patient is capable of making decisions on his/her own behalf.  5.Renal failure secondary to contrast nephropathy and ATN. Hemodialysis initiated. Ongoing mgt per renal services.  6. Hypertension. Cardizem. 90 mg every 12 hours, amiodarone 200 mg twice a day. Lopressor initiated yesterday.     7. Postoperative anemia. Latest hemoglobin 8.0 Transfuse as needed. Patient denies any chest pain or increased shortness of breath. ?epo candidate 8. Shortness of Breath:      -follow up CXR with improvement  -surgery following  -better after  HD  -robitussin for occasional cough  -O2 via [place]  -improved clinically today 9. Constipation:   -SSE with results yesterday  LOS (Days) 5 A FACE TO FACE EVALUATION WAS PERFORMED  Cerise Lieber T 11/21/2011, 7:15 AM

## 2011-11-21 NOTE — Progress Notes (Signed)
Social Work Patient ID: Gabriel Tran, male   DOB: [birth year], 56 y.o.   MRN: [id number]  Have reviewed team conference with both pt and with wife - aware and agreeable with targeted d/c 11/27/11 at modified independent to supervision level.  Wife plans to take approx 7-10 days off of work to stay with patient.  Wife with concerns about pt's medical status - explained to her that several medical staff are following pt closely and all have input as to pt's discharge readiness.  Will continue to follow and provide support.  Jalei Shibley

## 2011-11-21 NOTE — Progress Notes (Signed)
Speech Language Pathology Daily Session Note  Patient Details  Name: Gabriel Tran MRN: [id number] Date of Birth: 1955-02-17  Today's Date: 11/21/2011 Time: 1400-1430 Time Calculation (min): 30 min  Short Term Goals: Week 1: SLP Short Term Goal 1 (Week 1): short term goals = long term goals  Skilled Therapeutic Interventions: Treatment focus on cognitive goals. Pt demonstrated appropriate short-term memory with recall of sternal precautions and fluid restrictions with Mod I.  Pt also demonstrated emergent awareness by reporting he feels "slow" and has a difficult time "putting things together." Pt's mother present and voiced concern about sleep apnea and need for C-PAP to increase restful sleep at night. RN and PA made aware.    FIM:  Comprehension Comprehension Mode: Auditory Comprehension: 6-Follows complex conversation/direction: With extra time/assistive device Expression Expression Mode: Verbal Expression: 6-Expresses complex ideas: With extra time/assistive device Social Interaction Social Interaction: 6-Interacts appropriately with others with medication or extra time (anti-anxiety, antidepressant). Problem Solving Problem Solving: 5-Solves complex 90% of the time/cues < 10% of the time Memory Memory: 5-Recognizes or recalls 90% of the time/requires cueing < 10% of the time FIM - Eating Eating Activity: 5: Set-up assist for open containers  Pain Pain Assessment Pain Assessment: No/denies pain  Therapy/Group: Individual Therapy  Jewelz Kobus 11/21/2011, 2:43 PM

## 2011-11-21 NOTE — Progress Notes (Signed)
Recreational Therapy Session Note  Patient Details  Name: Gabriel Tran MRN: [id number] Date of Birth: 1955-06-22 Today's Date: 11/21/2011 Time: 4098-1191  Skilled Therapeutic Interventions/Progress Updates:  Pt participated in bowling activity with another patient standing working on activity tolerance, dynamic standing balance, picking ball up from floor with close supervision.  Pt did experience LOB x3, but able to self correct without physical assist. Pt completed score-keeping task with mod independence. O2 sats>90% on 2L O2 throughout session.  Pain: no c/o pain Therapy/Group: Co-Treatment   Enio Hornback 11/21/2011, 4:39 PM

## 2011-11-21 NOTE — Progress Notes (Addendum)
Physical Therapy Session Note  Patient Details  Name: Gabriel Tran MRN: [id number] Date of Birth: 1955/06/15  Today's Date: 11/21/2011 Time: 7829-5621 Time Calculation (min): 41 min  Short Term Goals: Week 1:  PT Short Term Goal 1 (Week 1): = LTG of mod I overall  Skilled Therapeutic Interventions/Progress Updates:    This morning's session focused on safe mobility following sternal precautions, increasing endurance with gait training and NuStep x 5 mins level 3 (13-14 RPE scale at end of NuStep).  Pt needed rest breaks due to increased DOE (3/4 with gait on 3 L O2 [place]), but O2 sats stayed in the mid 90s.  We practiced car transfer and stairs as well as bed mobility and transfers off of lower couch in ADL apartment to simulate a more home-like environment.  Continued need for balance, endurance and lower extremity strengthening exercises.    Therapy Documentation Precautions:  Precautions Precautions: Sternal;Fall Precaution Comments: PE, acute renal failure; on dialysis, sternal precautions, on blood thinners Restrictions Weight Bearing Restrictions: No Vital Signs: Therapy Vitals BP: 148/60 mmHg Pain: Pain Assessment Pain Assessment: No/denies pain Pain Score: 0-No pain Locomotion : Ambulation Ambulation/Gait Assistance: 5: Supervision Ambulation Distance (Feet): 600 Feet Assistive device: None Ambulation/Gait Assistance Details: supervision for safety due to small LOB while distracted/divided attention, no external assist needed to regain balance.   Stairs / Additional Locomotion Stairs: Yes Stairs Assistance: 5: Supervision Stairs Assistance Details: Verbal cues for technique Stairs Assistance Details (indicate cue type and reason): supervision for safety due to trying stairs reciprocally with no handrails and then again with supervision with handrails (verbal cues for light touch on handrails for balance, not actually use for pulling body due to sternal  precautions.) Stair Management Technique: No rails;Two rails;Alternating pattern;Forwards Number of Stairs: 8  Height of Stairs: 6  Ramp: 5: Supervision     Other Treatments: Treatments Therapeutic Activity: Car transfer with supervision.  Verbal cues for safe technique and strategies to adjust car to make it easier.  Bed mobility and transfers on/off of low couch and real bed in ADL apartment.  Pt crawled into bed on hands and knees with supervision and then got up from side once situated with supervision.    See FIM for current functional status  Therapy/Group: Individual Therapy  Lurena Joiner B. Deyani Hegarty, PT, DPT [phone]   11/21/2011, 1:22 PM

## 2011-11-21 NOTE — Progress Notes (Signed)
Occupational Therapy Session Note  Patient Details  Name: Gabriel Tran MRN: [id number] Date of Birth: 16-Oct-1955  Today's Date: 11/21/2011  Session 1 Time: 0700-0755 Time Calculation (min): 55 min  Short Term Goals: Week 1:  OT Short Term Goal 1 (Week 1): Pt.  will bathe LB with supervision while standing OT Short Term Goal 2 (Week 1): Pt. will dress LB while standing with supervision OT Short Term Goal 3 (Week 1): Pt. will perform breathing strategies with 1 questioning cue.   OT Short Term Goal 4 (Week 1): Pt. will transfer to toilet with mod I OT Short Term Goal 5 (Week 1): Pt. will perform light household task for 10 mins. at standing level at mod I level.    Skilled Therapeutic Interventions/Progress Updates:    Pt resting in bed and stated that he had just fallen asleep.  Pt stated he was hoping that therapy could be rescheduled but was understanding and cooperative when it was explained that it couldn't this morning but schedule would be modified for future dates.  Pt amb to bathroom with supervision for toileting tasks.  Pt transitioned to bathing and dressing tasks with sit<>stand at sink.  Pt elected to not wear shirt but preferred to wear hospital gown in addition to pants.  Pt completed bathing and dressing tasks without O2 and O2 sats>90% for approx 30 mins.  Pt required multiple rest breaks and exhibited 3/4 dyspnea.  Pt remained sitting in chair to eat breakfast.  Focus on activity tolerance, dynamic standing balance, and safety awareness.  Therapy Documentation Precautions:  Precautions Precautions: Sternal;Fall Precaution Comments: PE, acute renal failure; on dialysis, sternal precautions, on blood thinners Restrictions Weight Bearing Restrictions: No  Pain: Pain Assessment Pain Assessment: No/denies pain  See FIM for current functional status  Therapy/Group: Individual Therapy  Session 2 Time: 1300-1345 Pt denies pain Individual therapy Pt engaged in  therapeutic activity involving functional ambulation, dynamic standing balance, retrieving items from floor, and challenging activity tolerance.  Pt engaged in bowling activity with other patient and recreational therapist.  Pt demonstrated ability to calculate score without use of paper and pencil.  Pt exhibited LOB X 3 but able to self correct.  Pt O2 sats>90% on 2L O2 throughout session.  Lavone Neri [facility] 11/21/2011, 2:55 PM

## 2011-11-21 NOTE — Progress Notes (Signed)
Inpatient RehabilitationTeam Conference Note Date: 11/20/2011   Time: 2:50 PM    Patient Name: Gabriel Tran      Medical Record Number: [id number]  Date of Birth: 08-01-1955 Sex: Male         Room/Bed: 4004/4004-01 Payor Info: Payor: [employer]  Plan: C[employer] MANAGED  Product Type: *No Product type*     Admitting Diagnosis: AAA REPAIR/DECOND  Admit Date/Time:  11/16/2011  5:48 PM Admission Comments: No comment available   Primary Diagnosis:  Physical deconditioning Principal Problem: Physical deconditioning  Patient Active Problem List   Diagnosis Date Noted  . Physical deconditioning 11/19/2011  . Acute respiratory failure 11/09/2011  . Hypoxemia 11/09/2011  . A-fib 11/09/2011  . Aortic dissection 10/31/2011  . RBBB 10/30/2011  . HTN (hypertension), malignant 10/29/2011  . Chest pain 10/29/2011  . Leukocytosis 10/29/2011  . Routine general medical examination at a health care facility 09/07/2011  . Back pain 09/07/2011  . HYPERLIPIDEMIA 06/11/2007  . ESSENTIAL HYPERTENSION, BENIGN 06/11/2007  . PSORIASIS 06/11/2007  . HYPERGLYCEMIA 06/11/2007    Expected Discharge Date: Expected Discharge Date: 11/27/11  Team Members Present: Physician: Dr. Faith Rogue Social Worker Present: Amada Jupiter, LCSW Nurse Present: Carmie End, RN PT Present: Reggy Eye, PT OT Present: Roney Mans, OT Other (Discipline and Name): Tora Duck, PPS Coordinator     Current Status/Progress Goal Weekly Team Focus  Medical   deconditioning after aaa repari, multiple medical, encephalopathy  increase stamina and exercise tolerance  manage fluid status, renal issues, pleural effusion mgt   Bowel/Bladder   constipation resolving  small to moderate formed bm's since this am  min assist  bm every other day   Swallow/Nutrition/ Hydration             ADL's   supervision/min a overall; decreased activity tolerance  mod I overall  activity tolerance, family education   Mobility   Up to  min assist with challanges, impaired endurance  modified independence  increased endurance/strength, adherence to precautions, dynamic balance, decreased need for assist.    Communication             Safety/Cognition/ Behavioral Observations            Pain   n/a         Skin   Staples to midline chest/rt chest  no skin breakdown  educate on skin care and incision care .    Rehab Goals Patient on target to meet rehab goals: Yes *See Interdisciplinary Assessment and Plan and progress notes for long and short-term goals  Barriers to Discharge: poor stamina, fluctuating volume    Possible Resolutions to Barriers:  HD, increase activity tolerance, pacing, adaptive techniques    Discharge Planning/Teaching Needs:  home with wife who can take off work 7-10 days initially and provide 24/7 supervision      Team Discussion:  Pt very deconditioned and pulmonary status remains compromised.  Medical issues are really greater than therapy needs.    Revisions to Treatment Plan:  None   Continued Need for Acute Rehabilitation Level of Care: The patient requires daily medical management by a physician with specialized training in physical medicine and rehabilitation for the following conditions: Daily direction of a multidisciplinary physical rehabilitation program to ensure safe treatment while eliciting the highest outcome that is of practical value to the patient.: Yes Daily medical management of patient stability for increased activity during participation in an intensive rehabilitation regime.: Yes Daily analysis of laboratory values and/or radiology reports with  any subsequent need for medication adjustment of medical intervention for : Cardiac problems;Pulmonary problems;Post surgical problems;Neurological problems;Other  [place] 11/21/2011, 2:50 PM

## 2011-11-21 NOTE — Progress Notes (Signed)
ANTICOAGULATION CONSULT NOTE - Follow Up Consult  Pharmacy Consult for Coumadin Indication: pulmonary embolus  No Known Allergies  Patient Measurements: Height: 5\' 8"  (172.7 cm) Weight: 230 lb 6.1 oz (104.5 kg) IBW/kg (Calculated) : 68.4   Vital Signs: Temp: 98.4 F (36.9 C) (10/16 0500) Temp src: Oral (10/16 0500) BP: 158/84 mmHg (10/16 0500) Pulse Rate: 65  (10/16 0500)  Labs:  Basename 11/21/11 0507 11/20/11 1705 11/20/11 0635 11/19/11 0606  HGB -- 8.0* 8.0* --  HCT -- 24.2* 24.4* 24.4*  PLT -- 287 269 261  APTT -- -- -- --  LABPROT 22.5* 29.2* 24.3* --  INR 2.08* 2.95* 2.30* --  HEPARINUNFRC -- -- -- --  CREATININE -- 4.95* 4.11* 5.27*  CKTOTAL -- -- -- --  CKMB -- -- -- --  TROPONINI -- -- -- --    Estimated Creatinine Clearance: 20 ml/min (by C-G formula based on Cr of 4.95).  Assessment: 56 year old male s/p repair AAA 9/24, complicated post-op course including PE 9/28. Patient underwent emergent sternotomy removing sternal wires/cardiac tamponade 11/04/2011. A chest tube was placed. Patient was extubated 11/10/2011. Developed acute renal failure creatinine increased to 6.22 likely ATN from dye exposure during TAA repair and started on CVVHD.   Pt continues on coumadin for PE. Lovenox has been stopped. INR today remains therapeutic at 2.08. His dose was held last night (and likely the night before) due to rechecked INR increase in the afternoon. Unclear where INR will head tomorrow due to held doses and amiodarone interaction. Do not want him to become subtherapeutic either.   Goal of Therapy:  INR 2-2.5   Plan:  1. Coumadin 4mg  PO x 1 tonight 2. F/u AM INR  Lysle Pearl, PharmD, BCPS Pager # [phone] 11/21/2011 9:53 AM

## 2011-11-21 NOTE — Progress Notes (Signed)
Subjective:  Dialysis again yesterday 2.5 liters or so off Continues to drink excess fluids Reviewed with him again Says he has "sleep apnea" He thinks "dialysis caused it" 12 pound weight discrepancy today from post HD yesterday Objective:   Vital signs in last 24 hours: Filed Vitals:   11/21/11 0038 11/21/11 0330 11/21/11 0353 11/21/11 0500  BP:  162/75  158/84  Pulse:  66  65  Temp:  98.4 F (36.9 C)  98.4 F (36.9 C)  TempSrc:  Oral  Oral  Resp:  28  19  Height:      Weight:    104.5 kg (230 lb 6.1 oz)  SpO2: 96% 92% 93% 96%   Weight change: -4.4 kg (-9 lb 11.2 oz)  Intake/Output Summary (Last 24 hours) at 11/21/11 0831 Last data filed at 11/21/11 0015  Gross per 24 hour  Intake    720 ml  Output   2488 ml  Net  -1768 ml   11/21/11 104.5 kg 11/20/11 99kg post dialysis 11/20/11 100.8 kg PRE DIALYSIS 11/20/11 101 kg AM  11/19/11 100.3 kg POST DIALYSIS  11/19/11 105.2 kg PRE DIALYSS  Physical Exam:  Blood pressure 158/84, pulse 65, temperature 98.4 F (36.9 C), temperature source Oral, resp. rate 19, height 5\' 8"  (1.727 m), weight 104.5 kg (230 lb 6.1 oz), SpO2 96.00%. Awake, alert, oriented Diminished breath sounds both lung bases Incision sternal with steristrips 2+ edema right 1+ left Perm cath with dry dressing   Lab 11/20/11 1705 11/20/11 0635 11/19/11 0606 11/18/11 0248 11/16/11 0500 11/15/11 0429  NA 126* 130* 125* 120* 126* 130*  K 3.9 3.9 4.5 5.5* 4.9 4.7  CL 92* 94* 88* 85* 88* 96  CO2 25 28 24 21 20 23   GLUCOSE 151* 115* 124* 100* 162* 150*  BUN 32* 24* 39* 60* 68* 45*  CREATININE 4.95* 4.11* 5.27* 7.10* 7.75* 5.22*  ALB -- -- -- -- -- --  CALCIUM 8.6 8.5 8.4 8.9 8.9 8.7  PHOS 4.1 4.9* 5.7* 7.1* 7.8* 5.9*     Lab 11/20/11 1705 11/20/11 0635 11/19/11 0606 11/16/11 0500 11/15/11 0429  AST -- -- -- 28 35  ALT -- -- -- 86* 111*  ALKPHOS -- -- -- 126* 143*  BILITOT -- -- -- 0.9 1.0  PROT -- -- -- 6.2 6.0  ALBUMIN 2.4* 2.3* 2.3* -- --     Lab 11/20/11 1705 11/20/11 0635 11/19/11 0606 11/18/11 0248  WBC 10.4 7.2 8.9 11.8*  NEUTROABS -- -- -- --  HGB 8.0* 8.0* 8.1* 8.3*  HCT 24.2* 24.4* 24.4* 24.6*  MCV 86.4 87.1 86.2 84.8  PLT 287 269 261 229   Lab 11/20/11 2135 11/20/11 1604 11/20/11 1118 11/20/11 0709 11/20/11 0012  GLUCAP 96 108* 106* 114* 144*      Lab 11/20/11 0635 11/19/11 1337  IRON 37* --  TIBC 239 --  TRANSFERRIN -- --  FERRITIN -- 870*    Studies/Results: Dg Chest 2 View  11/21/2011  *RADIOLOGY REPORT*  Clinical Data: Right pleural effusion, post CABG, shortness of breath.  CHEST - 2 VIEW  Comparison: 11/19/2011.  Findings: Trachea is midline.  Heart is enlarged, stable.  There are bilateral pleural effusions with bibasilar air space disease, right greater than left.  Right hemidiaphragm is likely elevated as well. There may be developing subpleural scarring in the upper hemithoraces.  The right IJ dialysis catheter tips are stable in position.  Lower thoracic compression fracture is stable.  IMPRESSION: Small bilateral pleural effusions and  bibasilar air space disease, right greater than left.   Original Report Authenticated By: Reyes Ivan, M.D.          . amiodarone  200 mg Oral BID  . bisacodyl  10 mg Rectal Once  . calcium acetate  1,334 mg Oral TID WC  . cloNIDine  0.1 mg Oral BID  . darbepoetin (ARANESP) injection - DIALYSIS  100 mcg Intravenous Q Tue-HD  . diltiazem  90 mg Oral Q12H  . docusate sodium  200 mg Oral BID  . metoprolol tartrate  25 mg Oral BID  . multivitamin  1 tablet Oral QHS  . pantoprazole  40 mg Oral Q1200  . psyllium  1 packet Oral Daily  . Warfarin - Pharmacist Dosing Inpatient   Does not apply q1800  . DISCONTD: docusate sodium  200 mg Oral Daily  . DISCONTD: warfarin  4 mg Oral ONCE-1800  . DISCONTD: warfarin  4 mg Oral ONCE-1800  . DISCONTD: warfarin  4 mg Oral Once     I  have reviewed scheduled and prn medications.  ASSESSMENT/RECOMMENDATIONS  1.  S/P emergency repair type A ascending aortic dissection 10/30/11 with emergency sternotomy for tamponade 9/28, sternal closure 10/1   2. Dialysis dependent AKI in the setting of #1 plus contrast plus ACE inhibitor  Renal arteries were patent on contrasted imaging  Dialysis dependent since 11/06/11 (initially CRRT then transition to HD)  No evidence yet of renal recovery but I/O not documented well (minimal output per pt) Continue HD TTS schedule  Fluid restriction reinforced   3. PE on Coumadin   4. Atrial arrythmias on cardizem   5. Anemia -   Darbe 100 Q Tuesday with HD (10/15) Start iron with HD (10/17) for Tsat of 15 (load X 10 doses)  6. CKD-MBD - binders  Good control NEED TO AVOID FURTHER USE OF FLEETS ENEMAS FOR CONSTIPATION DUE TO HIGH PHOSPHORUS CONTENT!!!  7. Per SW target discharge date is 11/27/11   Camille Bal, MD [facility] [phone] Pager 11/21/2011, 8:31 AM

## 2011-11-22 ENCOUNTER — Inpatient Hospital Stay (HOSPITAL_COMMUNITY): Payer: PRIVATE HEALTH INSURANCE

## 2011-11-22 ENCOUNTER — Inpatient Hospital Stay (HOSPITAL_COMMUNITY): Payer: PRIVATE HEALTH INSURANCE | Admitting: Speech Pathology

## 2011-11-22 ENCOUNTER — Encounter (HOSPITAL_COMMUNITY): Payer: Self-pay

## 2011-11-22 ENCOUNTER — Inpatient Hospital Stay (HOSPITAL_COMMUNITY): Payer: Managed Care, Other (non HMO) | Admitting: Physical Therapy

## 2011-11-22 LAB — CBC
HCT: 22.2 % — ABNORMAL LOW (ref 39.0–52.0)
MCH: 31.6 pg (ref 26.0–34.0)
MCV: 86.7 fL (ref 78.0–100.0)
RDW: 15.1 % (ref 11.5–15.5)
WBC: 12.7 10*3/uL — ABNORMAL HIGH (ref 4.0–10.5)

## 2011-11-22 LAB — RENAL FUNCTION PANEL
Albumin: 2.5 g/dL — ABNORMAL LOW (ref 3.5–5.2)
Calcium: 8.8 mg/dL (ref 8.4–10.5)
GFR calc Af Amer: 10 mL/min — ABNORMAL LOW (ref >90)
GFR calc non Af Amer: 9 mL/min — ABNORMAL LOW (ref >90)
Phosphorus: 3.2 mg/dL (ref 2.3–4.6)
Potassium: 3.8 mEq/L (ref 3.5–5.1)
Sodium: 130 mEq/L — ABNORMAL LOW (ref 135–145)

## 2011-11-22 LAB — GLUCOSE, CAPILLARY
Glucose-Capillary: 107 mg/dL — ABNORMAL HIGH (ref 70–99)
Glucose-Capillary: 104 mg/dL — ABNORMAL HIGH (ref 70–99)

## 2011-11-22 MED ORDER — WARFARIN SODIUM 4 MG PO TABS
4.0000 mg | ORAL_TABLET | Freq: Once | ORAL | Status: AC
  Administered 2011-11-22: 4 mg via ORAL
  Filled 2011-11-22: qty 1

## 2011-11-22 NOTE — Progress Notes (Signed)
ANTICOAGULATION CONSULT NOTE - Follow Up Consult  Pharmacy Consult for Coumadin Indication: pulmonary embolus  No Known Allergies  Patient Measurements: Height: 5\' 8"  (172.7 cm) Weight: 228 lb 9.9 oz (103.7 kg) (PATIENT WEIGHED ON BED SCALES) IBW/kg (Calculated) : 68.4   Vital Signs: Temp: 98.4 F (36.9 C) (10/17 0528) Temp src: Oral (10/17 0528) BP: 160/102 mmHg (10/17 0528) Pulse Rate: 98  (10/17 0528)  Labs:  Basename 11/22/11 0640 11/21/11 0507 11/20/11 1705 11/20/11 0635  HGB -- -- 8.0* 8.0*  HCT -- -- 24.2* 24.4*  PLT -- -- 287 269  APTT -- -- -- --  LABPROT 22.4* 22.5* 29.2* --  INR 2.06* 2.08* 2.95* --  HEPARINUNFRC -- -- -- --  CREATININE -- -- 4.95* 4.11*  CKTOTAL -- -- -- --  CKMB -- -- -- --  TROPONINI -- -- -- --    Estimated Creatinine Clearance: 19.9 ml/min (by C-G formula based on Cr of 4.95).  Assessment: 56 year old male s/p repair AAA 9/24, complicated post-op course including PE 9/28. Patient underwent emergent sternotomy removing sternal wires/cardiac tamponade 11/04/2011. A chest tube was placed. Patient was extubated 11/10/2011. Developed acute renal failure creatinine increased to 6.22 likely ATN from dye exposure during TAA repair and started on CVVHD.   Pt continues on coumadin for PE. Lovenox has been stopped. INR today remains therapeutic at 2.06. His dose was held 10/15 (and likely the night before) due to rechecked INR increase in the afternoon. INR has stabilized from yesterday but may continue to increase a bit since dose had previously been missed. Will repeat last nights dose as we do not want him to become subtherapeutic.  Goal of Therapy:  INR 2-2.5   Plan:  1. Coumadin 4mg  PO x 1 tonight 2. F/u AM INR  Lysle Pearl, PharmD, BCPS Pager # [phone] 11/22/2011 10:32 AM

## 2011-11-22 NOTE — Progress Notes (Signed)
Physical Therapy Session Note  Patient Details  Name: Gabriel Tran MRN: [id number] Date of Birth: [birth year]  Today's Date: 11/22/2011 Time: 4098-1191 Time Calculation (min): 30 min  Short Term Goals: Week 1:  PT Short Term Goal 1 (Week 1): = LTG of mod I overall  Skilled Therapeutic Interventions/Progress Updates:   This afternoon's session focused on gait and balance training.  See details below.  DOE increased to 3-4/4 with gait.  Pt was unable to walk as far as yesterday. He also had significant increase in DOE with flat surface bed mobility.  O2 sats dropped to low 90s, but never below on 3 L O2 [place] during therapy.  PA aware of increased DOE today.     Therapy Documentation Precautions:  Precautions Precautions: Sternal;Fall Precaution Comments: PE, acute renal failure; on dialysis, sternal precautions, on blood thinners Restrictions Weight Bearing Restrictions: No   Vital Signs: Therapy Vitals Temp: 98.5 F (36.9 C) Temp src: Oral Pulse Rate: 64  Resp: 16  BP: 157/84 mmHg Patient Position, if appropriate: Lying Oxygen Therapy SpO2: 93 % O2 Device: Nasal cannula O2 Flow Rate (L/min): 4 L/min Pain: Pain Assessment Pain Assessment: No/denies pain Pain Score: 0-No pain Mobility: Transfers Sit to Stand: 5: Supervision;From bed Sit to Stand Details (indicate cue type and reason): with hands on knees Stand to Sit: 5: Supervision;To bed Locomotion : Ambulation Ambulation/Gait Assistance: 5: Supervision Ambulation Distance (Feet): 300 Feet (150' seated rest break, then 150') Assistive device: None Ambulation/Gait Assistance Details: continues to have mildly staggering gait pattern with increased DOE during gait (up to 4/4 today, PA notified O2 sats lower than yesterday, but still in the low 90s with gait)     Balance: Static Standing Balance Static Standing - Balance Support: No upper extremity supported Static Standing - Level of Assistance: 5: Stand by  assistance Static Standing - Comment/# of Minutes: tandem stand multiple trials bil feet, SLS multiple trials bil feet.   Exercises: Other Exercises Other Exercises: incintive spirometer x 3 reps max 1000 with increased DOE during this activity.   Other Treatments: Treatments Therapeutic Activity: Pt was able to perform all bed mobility independently without using his arms to pull.  He did require extra time to do this so ranked at modi level.    See FIM for current functional status  Therapy/Group: Individual Therapy  Lurena Joiner B. Dillie Burandt, PT, DPT [phone]   11/22/2011, 5:04 PM

## 2011-11-22 NOTE — Progress Notes (Signed)
Speech Language Pathology Daily Session Note  Patient Details  Name: RISHAWN WALCK MRN: [id number] Date of Birth: 07-20-1955  Today's Date: 11/22/2011 Time: 1030-1110 Time Calculation (min): 40 min  Short Term Goals: Week 1: SLP Short Term Goal 1 (Week 1): short term goals = long term goals  Skilled Therapeutic Interventions: Treatment focus on cognitive goals. Treatment focus on recall of new medications. Pt was able to identify the function of his new medications with supervision verbal cues. Pt also participated in verbal complex problem solving in regards to medication management with utilization of a pill box to increase recall and organization.    FIM:  Comprehension Comprehension Mode: Auditory Comprehension: 6-Follows complex conversation/direction: With extra time/assistive device Expression Expression Mode: Verbal Expression: 6-Expresses complex ideas: With extra time/assistive device Social Interaction Social Interaction: 6-Interacts appropriately with others with medication or extra time (anti-anxiety, antidepressant). Problem Solving Problem Solving: 5-Solves complex 90% of the time/cues < 10% of the time Memory Memory: 5-Recognizes or recalls 90% of the time/requires cueing < 10% of the time  Pain Pain Assessment Pain Assessment: No/denies pain  Therapy/Group: Individual Therapy  Tuvia Woodrick 11/22/2011, 12:05 PM

## 2011-11-22 NOTE — Progress Notes (Signed)
[facility] ROUNDING NOTE  SUBJECTIVE: Good night Still not making much urine 25 cc or so recorded About 50 cc in the urinal from this AM  Intake records do not match increase in weight since last HD but he is aware of need to restrict and recorded intake within the limits - he feels he did much better past 24 hours with this  OBJECTIVE: BP 160/102  Pulse 98  Temp 98.4 F (36.9 C) (Oral)  Resp 18  Ht 5\' 8"  (1.727 m)  Wt 103.7 kg (228 lb 9.9 oz)  BMI 34.76 kg/m2  SpO2 99% I/O last 3 completed shifts: In: 1380 [P.O.:1380] Out: 2513 [Urine:25; Other:2488]   PHYSICAL EXAMINATION: Sitting on the edge of the bed - getting ready to walk Wearing O2 Incisions OK Right lower ext 3+edema, left 2-3+ Dialysis catheter with dry intact dressing  (Labs to be done with hemo; no new labs from 10/17 yet)  Lab 11/20/11 1705 11/20/11 0635 11/19/11 0606 11/18/11 0248 11/16/11 0500  NA 126* 130* 125* 120* 126*  K 3.9 3.9 4.5 5.5* 4.9  CL 92* 94* 88* 85* 88*  CO2 25 28 24 21 20   GLUCOSE 151* 115* 124* 100* 162*  BUN 32* 24* 39* 60* 68*  CREATININE 4.95* 4.11* 5.27* 7.10* 7.75*  ALB -- -- -- -- --  CALCIUM 8.6 8.5 8.4 8.9 8.9  PHOS 4.1 4.9* 5.7* 7.1* 7.8*    Lab 11/20/11 1705 11/20/11 0635 11/19/11 0606 11/18/11 0248  WBC 10.4 7.2 8.9 11.8*  NEUTROABS -- -- -- --  HGB 8.0* 8.0* 8.1* 8.3*  HCT 24.2* 24.4* 24.4* 24.6*  MCV 86.4 87.1 86.2 84.8  PLT 287 269 261 229     Studies/Results: Dg Chest 2 View  11/21/2011  *RADIOLOGY REPORT*  Clinical Data: Right pleural effusion, post CABG, shortness of breath.  CHEST - 2 VIEW  Comparison: 11/19/2011.  Findings: Trachea is midline.  Heart is enlarged, stable.  There are bilateral pleural effusions with bibasilar air space disease, right greater than left.  Right hemidiaphragm is likely elevated as well. There may be developing subpleural scarring in the upper hemithoraces.  The right IJ dialysis catheter tips are stable in  position.  Lower thoracic compression fracture is stable.  IMPRESSION: Small bilateral pleural effusions and bibasilar air space disease, right greater than left.   Original Report Authenticated By: Reyes Ivan, M.D.     Medications: . amiodarone  200 mg Oral BID  . bisacodyl  10 mg Rectal Once  . calcium acetate  1,334 mg Oral TID WC  . cloNIDine  0.1 mg Oral BID  . darbepoetin (ARANESP) injection - DIALYSIS  100 mcg Intravenous Q Tue-HD  . diltiazem  90 mg Oral Q12H  . docusate sodium  200 mg Oral BID  . ferric gluconate (FERRLECIT/NULECIT) IV  125 mg Intravenous Q T,Th,Sa-HD  . metoprolol tartrate  25 mg Oral BID  . multivitamin  1 tablet Oral QHS  . pantoprazole  40 mg Oral Q1200  . psyllium  1 packet Oral Daily  . warfarin  4 mg Oral ONCE-1800  . Warfarin - Pharmacist Dosing Inpatient   Does not apply q1800   ASSESSMENT/RECOMMENDATIONS  1. S/P emergency repair type A ascending aortic dissection 10/30/11 with emergency sternotomy for tamponade 9/28, sternal closure 10/1   2. Dialysis dependent oliguric AKI in the setting of #1 plus contrast plus ACE inhibitor  Renal arteries were patent on contrasted imaging  Dialysis dependent since 11/06/11 (initially CRRT  then transition to HD)  No evidence yet of renal recovery but I/O not documented well (minimal output per pt)  Continue HD TTS schedule  Fluid restriction reinforced again and he understands  3. PE on Coumadin   4. Atrial arrythmias on cardizem   5. Anemia -  Darbe 100 Q Tuesday with HD (10/15)  Started iron with HD (10/17) for Tsat of 15 (load X 10 doses)   6. CKD-MBD - binders  Good control  NEED TO AVOID FURTHER USE OF FLEETS ENEMAS FOR CONSTIPATION DUE TO HIGH PHOSPHORUS CONTENT!!!   7. Per SW target discharge date is 11/27/11   Camille Bal, MD [facility] [phone] Pager 11/22/2011, 8:08 AM

## 2011-11-22 NOTE — Progress Notes (Signed)
Subjective/Complaints: Best night of sleep yet yesterday. Feels very well today A 12 point review of systems has been performed and if not noted above is otherwise negative.   Objective: Vital Signs: Blood pressure 160/102, pulse 98, temperature 98.4 F (36.9 C), temperature source Oral, resp. rate 18, height 5\' 8"  (1.727 m), weight 103.7 kg (228 lb 9.9 oz), SpO2 99.00%. Dg Chest 2 View  11/21/2011  *RADIOLOGY REPORT*  Clinical Data: Right pleural effusion, post CABG, shortness of breath.  CHEST - 2 VIEW  Comparison: 11/19/2011.  Findings: Trachea is midline.  Heart is enlarged, stable.  There are bilateral pleural effusions with bibasilar air space disease, right greater than left.  Right hemidiaphragm is likely elevated as well. There may be developing subpleural scarring in the upper hemithoraces.  The right IJ dialysis catheter tips are stable in position.  Lower thoracic compression fracture is stable.  IMPRESSION: Small bilateral pleural effusions and bibasilar air space disease, right greater than left.   Original Report Authenticated By: Reyes Ivan, M.D.     Basename 11/20/11 1705 11/20/11 0635  WBC 10.4 7.2  HGB 8.0* 8.0*  HCT 24.2* 24.4*  PLT 287 269    Basename 11/20/11 1705 11/20/11 0635  NA 126* 130*  K 3.9 3.9  CL 92* 94*  CO2 25 28  GLUCOSE 151* 115*  BUN 32* 24*  CREATININE 4.95* 4.11*  CALCIUM 8.6 8.5   CBG (last 3)   Basename 11/22/11 0707 11/22/11 0403 11/21/11 2348  GLUCAP 107* 114* 123*    Wt Readings from Last 3 Encounters:  11/22/11 103.7 kg (228 lb 9.9 oz)  11/16/11 103.7 kg (228 lb 9.9 oz)  11/16/11 103.7 kg (228 lb 9.9 oz)    Physical Exam:  Constitutional: He is oriented to person, place, and time. He appears well-developed.  HENT: mucosa pink and moist  Head: Normocephalic.  Eyes:  Pupils round and reactive to light  Neck: Neck supple. No thyromegaly present.  Cardiovascular: Normal rate and regular rhythm. Legs with 2+ edema. Trace  UE edema Pulmonary/Chest: increased breath sounds at the right base.Marland Kitchen He has no wheezes. Appears more comfortable today. Abdominal: Soft. Bowel sounds are normal. He exhibits no distension.  Neurological: He is alert and oriented to person, place, and time. resasonable memory, insight, and awareness.  Follows three-step commands. Strength grossly 4/5 in the upper ext and 3-4 in the lower ext's  Skin:  Chest tube out. Area clean and dressed. Chest/abd incision clean and intact with staples. Mild drainage only from RLQ wound. Psychiatric: He has a normal mood and affect.  Reduced sensation right second third digits seems improved.    Assessment/Plan: 1. Functional deficits secondary to deconditioning after aortic aneurysm repair/mediastinal exploration, multiple medical issues which require 3+ hours per day of interdisciplinary therapy in a comprehensive inpatient rehab setting. Physiatrist is providing close team supervision and 24 hour management of active medical problems listed below. Physiatrist and rehab team continue to assess barriers to discharge/monitor patient progress toward functional and medical goals. FIM: FIM - Bathing Bathing Steps Patient Completed: Chest;Right Arm;Left Arm;Abdomen;Front perineal area;Buttocks;Right upper leg;Left upper leg Bathing: 4: Min-Patient completes 8-9 7f 10 parts or 75+ percent  FIM - Upper Body Dressing/Undressing Upper body dressing/undressing steps patient completed: Thread/unthread right sleeve of pullover shirt/dresss;Thread/unthread left sleeve of pullover shirt/dress;Put head through opening of pull over shirt/dress;Pull shirt over trunk Upper body dressing/undressing: 0: Wears gown/pajamas-no public clothing FIM - Lower Body Dressing/Undressing Lower body dressing/undressing steps patient completed: Thread/unthread right  pants leg;Thread/unthread left pants leg;Pull pants up/down;Don/Doff right sock;Don/Doff left sock Lower body  dressing/undressing: 5: Supervision: Safety issues/verbal cues  FIM - Toileting Toileting steps completed by patient: Adjust clothing prior to toileting;Performs perineal hygiene;Adjust clothing after toileting Toileting Assistive Devices: Grab bar or rail for support Toileting: 5: Supervision: Safety issues/verbal cues  FIM - Diplomatic Services operational officer Devices: Grab bars Toilet Transfers: 5-To toilet/BSC: Supervision (verbal cues/safety issues)  FIM - Games developer Transfer: 5: Supine > Sit: Supervision (verbal cues/safety issues);5: Sit > Supine: Supervision (verbal cues/safety issues)  FIM - Locomotion: Wheelchair Distance: Total A secondary to sternal precautions Locomotion: Wheelchair: 0: Activity did not occur FIM - Locomotion: Ambulation Locomotion: Ambulation Assistive Devices: Designer, industrial/product Ambulation/Gait Assistance: 5: Supervision Locomotion: Ambulation: 5: Travels 150 ft or more with supervision/safety issues  Comprehension Comprehension Mode: Auditory Comprehension: 6-Follows complex conversation/direction: With extra time/assistive device  Expression Expression Mode: Verbal Expression: 6-Expresses complex ideas: With extra time/assistive device  Social Interaction Social Interaction: 6-Interacts appropriately with others with medication or extra time (anti-anxiety, antidepressant).  Problem Solving Problem Solving: 5-Solves complex 90% of the time/cues < 10% of the time  Memory Memory: 5-Recognizes or recalls 90% of the time/requires cueing < 10% of the time  Medical Problem List and Plan:  1. Deconditioning following emergency aortic aneurysm repair and mediastinal exploration  2. 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 Monitor for any bleeding episodes, coumadin adjustment per pharmacy with avelox initiation. 3. Pain Management: Oxycodone as needed. Monitor with increased  mobility  4. Neuropsych: This patient is capable of making decisions on his/her own behalf.  5.Renal failure secondary to contrast nephropathy and ATN. Hemodialysis initiated. Ongoing mgt per renal services.   -TEDS, elevation for edema control 6. Hypertension. Cardizem. 90 mg every 12 hours, amiodarone 200 mg twice a day. Lopressor initiated yesterday.     7. Postoperative anemia. Latest hemoglobin 8.0 Transfuse as needed. Patient denies any chest pain or increased shortness of breath. ?epo candidate 8. Shortness of Breath:      -follow up CXR with improvement  -surgery following  -better after HD  -robitussin for occasional cough  -O2 via [place]  -improved clinically today  -cpap has been helpful for breathing at night/sleep 9. Constipation:   -SSE with results yesterday  LOS (Days) 6 A FACE TO FACE EVALUATION WAS PERFORMED  Margarett Viti T 11/22/2011, 7:29 AM

## 2011-11-22 NOTE — Progress Notes (Signed)
Placed patient on CPAP per MD order via nasal mask, auto titrate settings (min 6.0, max 18.0) cm H20 with 3 lpm O2 bleed in.  HR 68, O2 Sat 96%. RN aware. Pt. Tolerating well at this time.

## 2011-11-22 NOTE — Progress Notes (Signed)
Occupational Therapy Session Note  Patient Details  Name: PACEY ALTIZER MRN: [id number] Date of Birth: [birth year]  Today's Date: 11/22/2011 Time: 0900-1000 Time Calculation (min): 60 min  Short Term Goals: Week 1:  OT Short Term Goal 1 (Week 1): Pt.  will bathe LB with supervision while standing OT Short Term Goal 2 (Week 1): Pt. will dress LB while standing with supervision OT Short Term Goal 3 (Week 1): Pt. will perform breathing strategies with 1 questioning cue.   OT Short Term Goal 4 (Week 1): Pt. will transfer to toilet with mod I OT Short Term Goal 5 (Week 1): Pt. will perform light household task for 10 mins. at standing level at mod I level.    Skilled Therapeutic Interventions/Progress Updates:    Pt engaged in bathing and dressing tasks with sit<>stand from w/c at sink.  Pt amb without AD to bathroom for toileting.  No LOB observed.  Pt required increased assistance with LB bathing tasks this morning.  Pt stated he was still tired from earlier PT session.  Pt stood at sink for approx 5 minutes to complete bathing tasks but requested to sit down to complete grooming tasks.  Pt continues to exhibit 3/4 dyspnea but O2 sats >90% on 1L O2.  Pt required min verbal cues for initiation and sequencing.  Focus on task initiation, activity tolerance, standing balance, and safety awareness.  Therapy Documentation Precautions:  Precautions Precautions: Sternal;Fall Precaution Comments: PE, acute renal failure; on dialysis, sternal precautions, on blood thinners Restrictions Weight Bearing Restrictions: No   Pain: Pain Assessment Pain Assessment: 0-10  See FIM for current functional status  Therapy/Group: Individual Therapy  Rich Brave 11/22/2011, 10:51 AM

## 2011-11-22 NOTE — Progress Notes (Signed)
Occupational Therapy Weekly Progress Note  Patient Details  Name: Gabriel Tran MRN: [id number] Date of Birth: Feb 07, 1955  Today's Date: 11/22/2011  Patient has met 3 of 5 short term goals.  Pt has made steady progress since admission but continues to exhibit decreased activity tolerance and decreased RUE strength and function.  Pt is supervision for toilet transfers and toileting, supervision for UB bathing and dressing, and min A for LB bathing and dressing.  Pt requires multiple rest breaks throughout therapy sessions and continues to exhibit increased SOB with activity although O2 sats >90% on 1-3L O2.**  Patient continues to demonstrate the following deficits:muscle weakness and right hand weakness, decreased cardiorespiratoy endurance and decreased oxygen support and decreased standing balance, decreased balance strategies and difficulty maintaining precautionsand therefore will continue to benefit from skilled OT intervention to enhance overall performance with BADL and iADL.  Patient progressing toward long term goals..  Continue plan of care.  OT Short Term Goals Week 1:  OT Short Term Goal 1 (Week 1): Pt.  will bathe LB with supervision while standing OT Short Term Goal 1 - Progress (Week 1): Met OT Short Term Goal 2 (Week 1): Pt. will dress LB while standing with supervision OT Short Term Goal 2 - Progress (Week 1): Met OT Short Term Goal 3 (Week 1): Pt. will perform breathing strategies with 1 questioning cue.   OT Short Term Goal 3 - Progress (Week 1): Met OT Short Term Goal 4 (Week 1): Pt. will transfer to toilet with mod I OT Short Term Goal 4 - Progress (Week 1): Progressing toward goal OT Short Term Goal 5 (Week 1): Pt. will perform light household task for 10 mins. at standing level at mod I level.   OT Short Term Goal 5 - Progress (Week 1): Progressing toward goal Week 2:  OT Short Term Goal 1 (Week 2): Pt. will transfer to toilet with mod I OT Short Term Goal 2 (Week  2): Pt. will perform light household task for 10 mins. at standing level at mod I level OT Short Term Goal 3 (Week 2): Pt will perform bathing and dressing tasks with mod I  Skilled Therapeutic Interventions/Progress Updates:      Therapy Documentation Precautions:  Precautions Precautions: Sternal;Fall Precaution Comments: PE, acute renal failure; on dialysis, sternal precautions, on blood thinners Restrictions Weight Bearing Restrictions: No   Pain: Pain Assessment Pain Assessment: No/denies pain ADL: ADL Eating: Independent Where Assessed-Eating: Chair Grooming: Modified independent Where Assessed-Grooming: Sitting at sink;Chair Upper Body Bathing: Supervision/safety Where Assessed-Upper Body Bathing: Sitting at sink Lower Body Bathing: Minimal assistance Where Assessed-Lower Body Bathing: Standing at sink Upper Body Dressing: Supervision/safety Where Assessed-Upper Body Dressing: Sitting at sink Lower Body Dressing: Minimal assistance Where Assessed-Lower Body Dressing: Sitting at sink;Standing at sink Toileting: Supervision/safety Where Assessed-Toileting: Teacher, adult education: Distant supervision Toilet Transfer Method: Ambulating  See FIM for current functional status  Therapy/Group: Individual Therapy  Rich Brave 11/22/2011, 3:28 PM

## 2011-11-22 NOTE — Progress Notes (Signed)
Physical Therapy Session Note  Patient Details  Name: Gabriel Tran MRN: [id number] Date of Birth: Jun 28, 1955  Today's Date: 11/22/2011 Time: 0805-0858 Time Calculation (min): 53 min  Short Term Goals: Week 1:  PT Short Term Goal 1 (Week 1): = LTG of mod I overall  Skilled Therapeutic Interventions/Progress Updates:  Patient sitting edge of bed upon entering room. Patient reports good night's rest with CPAP. Patient transferred bed to wheelchair with supervision. Patient worked on endurance and strength of LE's on Nustep x 5 minutes at workload 4 (Borg exertion 15 after nustep). Patient ambulated 200 feet on level tile with min assist to pull oxygen. Patient up and down 11 steps with 1 rail and supervision. Patient ambulated 150 feet back to room pushing wheelchair. Patient's O2 sats stayed in the mid to upper 90's and HR around 75 to 80 throughout the session.  Therapy Documentation Precautions:  Precautions Precautions: Sternal;Fall Precaution Comments: PE, acute renal failure; on dialysis, sternal precautions, on blood thinners Restrictions Weight Bearing Restrictions: No  Pain: Pain Assessment Pain Assessment: No/denies pain  Locomotion : Ambulation Ambulation/Gait Assistance: 5: Supervision   See FIM for current functional status  Therapy/Group: Individual Therapy  Arelia Longest M 11/22/2011, 9:01 AM

## 2011-11-23 ENCOUNTER — Inpatient Hospital Stay (HOSPITAL_COMMUNITY): Payer: PRIVATE HEALTH INSURANCE

## 2011-11-23 ENCOUNTER — Inpatient Hospital Stay (HOSPITAL_COMMUNITY): Payer: PRIVATE HEALTH INSURANCE | Admitting: Speech Pathology

## 2011-11-23 ENCOUNTER — Inpatient Hospital Stay (HOSPITAL_COMMUNITY): Payer: PRIVATE HEALTH INSURANCE | Admitting: Physical Therapy

## 2011-11-23 ENCOUNTER — Inpatient Hospital Stay (HOSPITAL_COMMUNITY): Payer: Managed Care, Other (non HMO) | Admitting: Occupational Therapy

## 2011-11-23 ENCOUNTER — Inpatient Hospital Stay (HOSPITAL_COMMUNITY): Payer: PRIVATE HEALTH INSURANCE | Admitting: Occupational Therapy

## 2011-11-23 DIAGNOSIS — Z5189 Encounter for other specified aftercare: Secondary | ICD-10-CM

## 2011-11-23 DIAGNOSIS — J9600 Acute respiratory failure, unspecified whether with hypoxia or hypercapnia: Secondary | ICD-10-CM

## 2011-11-23 DIAGNOSIS — R5381 Other malaise: Secondary | ICD-10-CM

## 2011-11-23 DIAGNOSIS — N179 Acute kidney failure, unspecified: Secondary | ICD-10-CM

## 2011-11-23 LAB — RENAL FUNCTION PANEL
BUN: 19 mg/dL (ref 6–23)
Creatinine, Ser: 4.38 mg/dL — ABNORMAL HIGH (ref 0.50–1.35)
Glucose, Bld: 93 mg/dL (ref 70–99)
Phosphorus: 2.7 mg/dL (ref 2.3–4.6)
Potassium: 4.2 mEq/L (ref 3.5–5.1)

## 2011-11-23 LAB — PROTIME-INR: Prothrombin Time: 25 seconds — ABNORMAL HIGH (ref 11.6–15.2)

## 2011-11-23 LAB — GLUCOSE, CAPILLARY
Glucose-Capillary: 102 mg/dL — ABNORMAL HIGH (ref 70–99)
Glucose-Capillary: 145 mg/dL — ABNORMAL HIGH (ref 70–99)

## 2011-11-23 MED ORDER — WARFARIN SODIUM 3 MG PO TABS
3.0000 mg | ORAL_TABLET | Freq: Once | ORAL | Status: AC
  Administered 2011-11-23: 3 mg via ORAL
  Filled 2011-11-23: qty 1

## 2011-11-23 NOTE — Progress Notes (Signed)
Physical Therapy Weekly Progress Note  Patient Details  Name: Gabriel Tran MRN: [id number] Date of Birth: 1955/02/12  Today's Date: 11/23/2011 Time: 0900-0958 Time Calculation (min): 58 min  Patient has met 2 of 8 long term goals.  Short term goals not set due to short estimated length of stay however pt's stay has been extended secondary to medical reasons.   Patient continues to demonstrate the following deficits: funtional weakness, decreased dynamic stability, significantly impaired endurance and therefore will continue to benefit from skilled PT intervention to enhance overall performance with activity tolerance, balance, ability to compensate for deficits and knowledge of precautions.  See Patient's Care Plan for progression toward long term goals.  Patient progressing toward long term goals..  Plan of care revisions: decreased some ambulation goals to supervision as pt will likely need some supervision at discharge. .  Skilled Therapeutic Interventions/Progress Updates:    Ambulation x 300', 100', 150' with supervision, still with some lateral losses of balance however able to correct without physical assist. Home environment ambulation over carpet forwards, backwards, sidestepping with supervision. Actual bed mobility performed with modified independence. Sit <> stand from low couch with supervision, cues for safe UE placement for precautions. Obstacle course negotiating obstacles stepping over obstacles and walking over compliant uneven surface. Pt supervision throughout however does have lateral instabilities requiring supervision. Incentive spirometry practiced, cues for posture and upper torso positioning.   SpO2 ~92-98% on 3L [place] O2, once down to 88% with long distance ambulation however pt recovers quickly with seated rest.   Therapy Documentation Precautions:  Precautions Precautions: Sternal;Fall Precaution Comments: PE, acute renal failure; on dialysis, sternal  precautions, on blood thinners Restrictions Weight Bearing Restrictions: No Pain: Pain Assessment Pain Assessment: No/denies pain  See FIM for current functional status  Therapy/Group: Individual Therapy  Sherrine Maples Cheek 11/23/2011, 12:30 PM

## 2011-11-23 NOTE — Progress Notes (Signed)
Occupational Therapy Session Note  Patient Details  Name: Gabriel Tran MRN: [id number] Date of Birth: 1955-06-06  Today's Date: 11/23/2011 Time: 1400-1430 Time Calculation (min): 30 min  Short Term Goals: Week 2:  OT Short Term Goal 1 (Week 2): Pt. will transfer to toilet with mod I OT Short Term Goal 2 (Week 2): Pt. will perform light household task for 10 mins. at standing level at mod I level OT Short Term Goal 3 (Week 2): Pt will perform bathing and dressing tasks with mod I  Skilled Therapeutic Interventions/Progress Updates:    1:1 worked on Engineer, agricultural task : made grilled cheese, including obtaining all items: working on dynamic balance, activity and standing tolerance. Able to complete the task with 2 rest breaks. Also performed toileting after BM with min cuing and provided strategies for maintaining sternal precautions.  Therapy Documentation Precautions:  Precautions Precautions: Sternal;Fall Precaution Comments: PE, acute renal failure; on dialysis, sternal precautions, on blood thinners Restrictions Weight Bearing Restrictions: No Pain: Pain Assessment Pain Assessment: 0-10 Pain Score:   3 Pain Type: Acute pain Pain Location: Back Pain Orientation: Lower Pain Descriptors: Aching Pain Frequency: Intermittent Pain Onset: Gradual Patients Stated Pain Goal: 2 Pain Intervention(s): Medication (See eMAR) (oxycodone 5 mg po) ADL: ADL Eating: Independent Where Assessed-Eating: Chair Grooming: Modified independent Where Assessed-Grooming: Sitting at sink;Chair Upper Body Bathing: Supervision/safety Where Assessed-Upper Body Bathing: Sitting at sink Lower Body Bathing: Minimal assistance Where Assessed-Lower Body Bathing: Standing at sink Upper Body Dressing: Supervision/safety Where Assessed-Upper Body Dressing: Sitting at sink Lower Body Dressing: Minimal assistance Where Assessed-Lower Body Dressing: Sitting at sink;Standing at sink Toileting:  Supervision/safety Where Assessed-Toileting: Teacher, adult education: Distant supervision Toilet Transfer Method: Ambulating  See FIM for current functional status  Therapy/Group: Individual Therapy  Roney Mans [facility] 11/23/2011, 3:31 PM

## 2011-11-23 NOTE — Progress Notes (Signed)
Subjective/Complaints: No new complaints. A little fatigued from therapy yesterday. Tolerated cpap again.  A 12 point review of systems has been performed and if not noted above is otherwise negative.   Objective: Vital Signs: Blood pressure 155/87, pulse 62, temperature 98.2 F (36.8 C), temperature source Oral, resp. rate 17, height 5\' 8"  (1.727 m), weight 102.2 kg (225 lb 5 oz), SpO2 100.00%. Dg Chest 2 View  11/21/2011  *RADIOLOGY REPORT*  Clinical Data: Right pleural effusion, post CABG, shortness of breath.  CHEST - 2 VIEW  Comparison: 11/19/2011.  Findings: Trachea is midline.  Heart is enlarged, stable.  There are bilateral pleural effusions with bibasilar air space disease, right greater than left.  Right hemidiaphragm is likely elevated as well. There may be developing subpleural scarring in the upper hemithoraces.  The right IJ dialysis catheter tips are stable in position.  Lower thoracic compression fracture is stable.  IMPRESSION: Small bilateral pleural effusions and bibasilar air space disease, right greater than left.   Original Report Authenticated By: Reyes Ivan, M.D.     Basename 11/22/11 1612 11/20/11 1705  WBC 12.7* 10.4  HGB 8.1* 8.0*  HCT 22.2* 24.2*  PLT 566* 287    Basename 11/22/11 1612 11/20/11 1705  NA 130* 126*  K 3.8 3.9  CL 91* 92*  CO2 26 25  GLUCOSE 125* 151*  BUN 39* 32*  CREATININE 6.45* 4.95*  CALCIUM 8.8 8.6   CBG (last 3)   Basename 11/22/11 2358 11/22/11 2047 11/22/11 1111  GLUCAP 145* 90 104*    Wt Readings from Last 3 Encounters:  11/23/11 102.2 kg (225 lb 5 oz)  11/16/11 103.7 kg (228 lb 9.9 oz)  11/16/11 103.7 kg (228 lb 9.9 oz)    Physical Exam:  Constitutional: He is oriented to person, place, and time. He appears well-developed.  HENT: mucosa pink and moist  Head: Normocephalic.  Eyes:  Pupils round and reactive to light  Neck: Neck supple. No thyromegaly present.  Cardiovascular: Normal rate and regular rhythm.  Legs with 2+ edema. Trace UE edema Pulmonary/Chest: increased breath sounds at the right base.Marland Kitchen He has no wheezes. Appears more comfortable today. Abdominal: Soft. Bowel sounds are normal. He exhibits no distension.  Neurological: He is alert and oriented to person, place, and time. resasonable memory, insight, and awareness.  Follows three-step commands. Strength grossly 4/5 in the upper ext and 3-4 in the lower ext's  Skin:   . Chest/abd incision clean and intact with staples.   Psychiatric: He has a normal mood and affect.  Reduced sensation right second third digits seems improved.    Assessment/Plan: 1. Functional deficits secondary to deconditioning after aortic aneurysm repair/mediastinal exploration, multiple medical issues which require 3+ hours per day of interdisciplinary therapy in a comprehensive inpatient rehab setting. Physiatrist is providing close team supervision and 24 hour management of active medical problems listed below. Physiatrist and rehab team continue to assess barriers to discharge/monitor patient progress toward functional and medical goals. FIM: FIM - Bathing Bathing Steps Patient Completed: Chest;Right Arm;Left Arm;Abdomen;Front perineal area;Right upper leg;Left upper leg;Buttocks Bathing: 4: Min-Patient completes 8-9 59f 10 parts or 75+ percent  FIM - Upper Body Dressing/Undressing Upper body dressing/undressing steps patient completed: Thread/unthread right sleeve of pullover shirt/dresss;Thread/unthread left sleeve of pullover shirt/dress;Put head through opening of pull over shirt/dress;Pull shirt over trunk Upper body dressing/undressing: 5: Set-up assist to: Obtain clothing/put away FIM - Lower Body Dressing/Undressing Lower body dressing/undressing steps patient completed: Thread/unthread right underwear leg;Thread/unthread left underwear  leg;Pull underwear up/down;Thread/unthread right pants leg;Thread/unthread left pants leg;Pull pants up/down;Don/Doff  right shoe;Don/Doff left shoe Lower body dressing/undressing: 5: Supervision: Safety issues/verbal cues  FIM - Toileting Toileting steps completed by patient: Adjust clothing prior to toileting;Performs perineal hygiene;Adjust clothing after toileting Toileting Assistive Devices: Grab bar or rail for support Toileting: 5: Supervision: Safety issues/verbal cues  FIM - Diplomatic Services operational officer Devices: Grab bars Toilet Transfers: 5-Set-up assist to: Apply orthosis/W/C setup;5-From toilet/BSC: Supervision (verbal cues/safety issues)  FIM - Games developer Transfer: 6: More than reasonable amt of time;6: Supine > Sit: No assist;6: Sit > Supine: No assist;5: Bed > Chair or W/C: Supervision (verbal cues/safety issues);5: Chair or W/C > Bed: Supervision (verbal cues/safety issues)  FIM - Locomotion: Wheelchair Distance: Total A secondary to sternal precautions Locomotion: Wheelchair: 0: Activity did not occur FIM - Locomotion: Ambulation Locomotion: Ambulation Assistive Devices: Designer, industrial/product Ambulation/Gait Assistance: 5: Supervision Locomotion: Ambulation: 5: Travels 150 ft or more with supervision/safety issues  Comprehension Comprehension Mode: Auditory Comprehension: 6-Follows complex conversation/direction: With extra time/assistive device  Expression Expression Mode: Verbal Expression: 6-Expresses complex ideas: With extra time/assistive device  Social Interaction Social Interaction: 6-Interacts appropriately with others with medication or extra time (anti-anxiety, antidepressant).  Problem Solving Problem Solving: 5-Solves complex 90% of the time/cues < 10% of the time  Memory Memory: 5-Recognizes or recalls 90% of the time/requires cueing < 10% of the time  Medical Problem List and Plan:  1. Deconditioning following emergency aortic aneurysm repair and mediastinal exploration  2. DVT Prophylaxis/Anticoagulation: Chronic Coumadin therapy  for postoperative pulmonary emboli. Monitor for any bleeding episodes, coumadin adjustment per pharmacy with avelox initiation. 3. Pain Management: Oxycodone as needed. Monitor with increased mobility  4. Neuropsych: This patient is capable of making decisions on his/her own behalf.  5.Renal failure secondary to contrast nephropathy and ATN. Hemodialysis initiated.  Marland Kitchen   -TEDS, elevation for edema control  -HD per nephrology 6. Hypertension. Cardizem. 90 mg every 12 hours, amiodarone 200 mg twice a day. Lopressor initiated yesterday.     7. Postoperative anemia. Latest hemoglobin 8.0 Transfuse as needed. Patient denies any chest pain or increased shortness of breath. ?epo candidate 8. Shortness of Breath:      -follow up CXR with improvement  -surgery following  -better after HD  -robitussin for occasional cough  -O2 via [place]  -improved clinically today  -cpap has been helpful for breathing at night/sleep 9. Constipation:   -metamucil, docusate  LOS (Days) 7 A FACE TO FACE EVALUATION WAS PERFORMED  Anselmo Reihl T 11/23/2011, 7:02 AM

## 2011-11-23 NOTE — Progress Notes (Signed)
ANTICOAGULATION CONSULT NOTE - Follow Up Consult  Pharmacy Consult for Coumadin Indication: pulmonary embolus  No Known Allergies  Patient Measurements: Height: 5\' 8"  (172.7 cm) Weight: 225 lb 5 oz (102.2 kg) IBW/kg (Calculated) : 68.4   Vital Signs: Temp: 98.2 F (36.8 C) (10/18 0500) Temp src: Oral (10/18 0500) BP: 148/78 mmHg (10/18 0933) Pulse Rate: 62  (10/18 0500)  Labs:  Basename 11/23/11 0630 11/22/11 1612 11/22/11 0640 11/21/11 0507 11/20/11 1705  HGB -- 8.1* -- -- 8.0*  HCT -- 22.2* -- -- 24.2*  PLT -- 566* -- -- 287  APTT -- -- -- -- --  LABPROT 25.0* -- 22.4* 22.5* --  INR 2.39* -- 2.06* 2.08* --  HEPARINUNFRC -- -- -- -- --  CREATININE 4.38* 6.45* -- -- 4.95*  CKTOTAL -- -- -- -- --  CKMB -- -- -- -- --  TROPONINI -- -- -- -- --    Estimated Creatinine Clearance: 22.3 ml/min (by C-G formula based on Cr of 4.38).  Assessment: 56 year old male s/p repair AAA 9/24, complicated post-op course including PE 9/28. Patient underwent emergent sternotomy removing sternal wires/cardiac tamponade 11/04/2011. A chest tube was placed. Patient was extubated 11/10/2011. Developed acute renal failure creatinine increased to 6.22 likely ATN from dye exposure during TAA repair and started on CVVHD.   Pt continues on coumadin for PE. Lovenox has been stopped. INR today remains therapeutic at 2.39. His dose was held 10/15 (and likely the night before) due to rechecked INR increase in the afternoon. INR continues to increase slowly.  Goal of Therapy:  INR 2-2.5   Plan:  1. Coumadin 3 mg PO x 1 tonight- if INR stabilizes and remains within goal in AM, will consider Coumadin 3mg  daily as maintenance regimen. 2. F/u AM INR  Thanks, Kase Shughart K. Allena Katz, PharmD, BCPS.  Clinical Pharmacist Pager [phone]. 11/23/2011 1:43 PM

## 2011-11-23 NOTE — Progress Notes (Signed)
  Subjective: S/P repair Type A aortic dissection with arch reconstruction, Ao valve repair Postop PE, tamponade, ATN, on coumadin INR goal 2.16m- 2.2 remians on HD NSR, severe HTN on meds  postop R pleural effusion Objective: Vital signs in last 24 hours: Temp:  [98.2 F (36.8 C)-99.2 F (37.3 C)] 98.2 F (36.8 C) (10/18 0500) Pulse Rate:  [62-98] 62  (10/18 0500) Cardiac Rhythm:  [-]  Resp:  [14-20] 17  (10/18 0500) BP: (148-178)/(70-89) 148/78 mmHg (10/18 0933) SpO2:  [91 %-100 %] 100 % (10/18 0500) Weight:  [216 lb 11.4 oz (98.3 kg)-225 lb 5 oz (102.2 kg)] 225 lb 5 oz (102.2 kg) (10/18 0500)  Hemodynamic parameters for last 24 hours:  nsr  Intake/Output from previous day: 10/17 0701 - 10/18 0700 In: 480 [P.O.:480] Out: 3000  Intake/Output this shift:    Incisions clean and dry bs sl reduced on R  Lab Results:  [facility] 11/22/11 1612 11/20/11 1705  WBC 12.7* 10.4  HGB 8.1* 8.0*  HCT 22.2* 24.2*  PLT 566* 287   BMET:  Basename 11/23/11 0630 11/22/11 1612  NA 137 130*  K 4.2 3.8  CL 98 91*  CO2 31 26  GLUCOSE 93 125*  BUN 19 39*  CREATININE 4.38* 6.45*  CALCIUM 8.5 8.8    PT/INR:  Basename 11/23/11 0630  LABPROT 25.0*  INR 2.39*   ABG    Component Value Date/Time   PHART 7.450 11/10/2011 0404   HCO3 25.5* 11/10/2011 0404   TCO2 27 11/10/2011 0404   ACIDBASEDEF 2.0 11/07/2011 0841   O2SAT 93.0 11/10/2011 0404   CBG (last 3)   Basename 11/23/11 0715 11/22/11 2358 11/22/11 2047  GLUCAP 95 145* 90    Assessment/Plan: S/P  repair aortic dissection Should be ready for DC home next Tues, will follow in office and get f/u 2D echo in 4-6 wks INR to be followed by Juleen Starr Pharm D  in coumadin clinic  LOS: 7 days    VAN TRIGT III,PETER 11/23/2011

## 2011-11-23 NOTE — Progress Notes (Signed)
Speech Language Pathology Daily Session Note  Patient Details  Name: Gabriel Tran MRN: [id number] Date of Birth: 1956-01-20  Today's Date: 11/23/2011 Time: 4098-1191 Time Calculation (min): 45 min  Short Term Goals: Week 1: SLP Short Term Goal 1 (Week 1): short term goals = long term goals  Skilled Therapeutic Interventions: Treatment focus on complex problem solving. Pt demonstrated appropriate problem solving and organization for medication management with use of a pill box. Pt also demonstrated appropriate anticipatory awareness in regards to f/u therapy and assistance required at home.    FIM:  Comprehension Comprehension Mode: Auditory Comprehension: 6-Follows complex conversation/direction: With extra time/assistive device Expression Expression Mode: Verbal Expression: 6-Expresses complex ideas: With extra time/assistive device Social Interaction Social Interaction: 6-Interacts appropriately with others with medication or extra time (anti-anxiety, antidepressant). Problem Solving Problem Solving: 5-Solves complex 90% of the time/cues < 10% of the time Memory Memory: 5-Recognizes or recalls 90% of the time/requires cueing < 10% of the time FIM - Eating Eating Activity: 5: Set-up assist for open containers  Pain No/Denies Pain  Therapy/Group: Individual Therapy  [name] 11/23/2011, 3:35 PM

## 2011-11-23 NOTE — Progress Notes (Signed)
[facility] ROUNDING NOTE  SUBJECTIVE:  Dialysis yesterday 3 liters UF HD unit weights don't correlate with floor weights   OBJECTIVE: BP 155/87  Pulse 62  Temp 98.2 F (36.8 C) (Oral)  Resp 17  Ht 5\' 8"  (1.727 m)  Wt 102.2 kg (225 lb 5 oz)  BMI 34.26 kg/m2  SpO2 100%  I/O last 3 completed shifts: In: 840 [P.O.:840] Out: 3000 [Other:3000]   Pre HD weight 102 Post HD weight 98.3 Eating breakfast/ brother helping him Acknowledges minimal UOP Tubular breath sounds about 1/2 Clear above that level 2-3+ edema of LE's  LABS: none today  Basename 11/22/11 1612 11/20/11 1705  NA 130* 126*  K 3.8 3.9  CL 91* 92*  CO2 26 25  GLUCOSE 125* 151*  BUN 39* 32*  CREATININE 6.45* 4.95*  CALCIUM 8.8 8.6  MG -- --  PHOS 3.2 4.1   Liver Function Tests:  Basename 11/22/11 1612 11/20/11 1705  AST -- --  ALT -- --  ALKPHOS -- --  BILITOT -- --  PROT -- --  ALBUMIN 2.5* 2.4*   CBC:  Basename 11/22/11 1612 11/20/11 1705  WBC 12.7* 10.4  NEUTROABS -- --  HGB 8.1* 8.0*  HCT 22.2* 24.2*  MCV 86.7 86.4  PLT 566* 287     Medications . amiodarone  200 mg Oral BID  . bisacodyl  10 mg Rectal Once  . calcium acetate  1,334 mg Oral TID WC  . cloNIDine  0.1 mg Oral BID  . darbepoetin (ARANESP) injection - DIALYSIS  100 mcg Intravenous Q Tue-HD  . diltiazem  90 mg Oral Q12H  . docusate sodium  200 mg Oral BID  . ferric gluconate (FERRLECIT/NULECIT) IV  125 mg Intravenous Q T,Th,Sa-HD  . metoprolol tartrate  25 mg Oral BID  . multivitamin  1 tablet Oral QHS  . pantoprazole  40 mg Oral Q1200  . psyllium  1 packet Oral Daily  . warfarin  4 mg Oral ONCE-1800  . Warfarin - Pharmacist Dosing Inpatient   Does not apply q1800  ASSESSMENT/RECOMMENDATIONS   1. S/P emergency repair type A ascending aortic dissection 10/30/11 with emergency sternotomy for tamponade 9/28, sternal closure 10/1   2. Dialysis dependent oliguric AKI in the setting of #1 plus contrast plus  ACE inhibitor  (Renal arteries were patent on contrasted imaging prior to aneurysm repair)  Dialysis dependent since 11/06/11 (initially CRRT then transition to HD)  No evidence yet of renal recovery Continue HD TTS schedule (OK'd for outpt HD with "acute renal failure dx" at [facility] at discharge)  Fluid restriction Still volume overloaded Challenging weight a little more with each treatment  3. Anemia  Darbe 100 Q Tuesday with HD (started10/15)  Started iron with HD (10/17) for Tsat of 15 (load X 10 doses)   4. CKD-MBD - binders  Good control   5. PE on Coumadin  6. Atrial arrythmias on cardizem  7. Rehab issues - Per SW target discharge date is 11/27/11 (after dialysis)

## 2011-11-23 NOTE — Progress Notes (Signed)
Occupational Therapy Session Note  Patient Details  Name: Gabriel Tran MRN: [id number] Date of Birth: 1955-09-07  Today's Date: 11/23/2011 Time: 1030-1130 Time Calculation (min): 60 min  Short Term Goals: Week 2:  OT Short Term Goal 1 (Week 2): Pt. will transfer to toilet with mod I OT Short Term Goal 2 (Week 2): Pt. will perform light household task for 10 mins. at standing level at mod I level OT Short Term Goal 3 (Week 2): Pt will perform bathing and dressing tasks with mod I   Skilled Therapeutic Interventions/Progress Updates:  Self care retraining to include sponge bath at sink seated in a regular chair, dressing and groom.  Focused session on activity tolerance, adhering to sternal precautions for all transitional movements and with BADL/IADL tasks, pursed lip breathing, ambulating in room without AD or any physical assist, and problem solving during BADL/IADL.  Patient able to wash below knees and his bottom today with supervision and using adaptive techniques with verbal cues.  After session, patient assisted with cleanup of supplies and dirty clothes.  Therapy Documentation Precautions:  Precautions Precautions: Sternal;Fall Precaution Comments: PE, acute renal failure; on dialysis, sternal precautions, on blood thinners Restrictions Weight Bearing Restrictions: No Pain: Pain Assessment Pain Assessment: No/denies pain  See FIM for current functional status  Therapy/Group: Individual Therapy  [name] 11/23/2011, 12:20 PM

## 2011-11-24 ENCOUNTER — Inpatient Hospital Stay (HOSPITAL_COMMUNITY): Payer: PRIVATE HEALTH INSURANCE

## 2011-11-24 ENCOUNTER — Inpatient Hospital Stay (HOSPITAL_COMMUNITY): Payer: Managed Care, Other (non HMO) | Admitting: Occupational Therapy

## 2011-11-24 LAB — RENAL FUNCTION PANEL
CO2: 28 mEq/L (ref 19–32)
Calcium: 9.1 mg/dL (ref 8.4–10.5)
GFR calc Af Amer: 10 mL/min — ABNORMAL LOW (ref >90)
Glucose, Bld: 120 mg/dL — ABNORMAL HIGH (ref 70–99)
Sodium: 130 mEq/L — ABNORMAL LOW (ref 135–145)

## 2011-11-24 LAB — PROTIME-INR: INR: 2.43 — ABNORMAL HIGH (ref 0.00–1.49)

## 2011-11-24 LAB — CBC
MCH: 28.3 pg (ref 26.0–34.0)
MCV: 88.7 fL (ref 78.0–100.0)
WBC: 7.7 10*3/uL (ref 4.0–10.5)

## 2011-11-24 LAB — GLUCOSE, CAPILLARY: Glucose-Capillary: 103 mg/dL — ABNORMAL HIGH (ref 70–99)

## 2011-11-24 MED ORDER — OXYCODONE HCL 5 MG PO TABS
ORAL_TABLET | ORAL | Status: AC
  Administered 2011-11-24: 10 mg via ORAL
  Filled 2011-11-24: qty 2

## 2011-11-24 MED ORDER — WARFARIN SODIUM 3 MG PO TABS
3.0000 mg | ORAL_TABLET | Freq: Once | ORAL | Status: AC
  Administered 2011-11-24: 3 mg via ORAL
  Filled 2011-11-24: qty 1

## 2011-11-24 NOTE — Progress Notes (Signed)
Patient ID: Gabriel Tran, male   DOB: 03/04/1955, 56 y.o.   MRN: [id number] Subjective/Complaints: No new complaints. . Tolerated cpap again. Believes if he derives clinical benefit from CPAP; serum sodium improved to 137  A 12 point review of systems has been performed and if not noted above is otherwise negative. HEENT-nasal cannula oxygen in place Chest-clear; CV- regular; Abd- benign; Extr- +3 edema  CBG (last 3)   Basename 11/24/11 0727 11/23/11 2034 11/23/11 1654  GLUCAP 103* 112* 108*     Lab Results  Component Value Date   INR 2.43* 11/24/2011   INR 2.39* 11/23/2011   INR 2.06* 11/22/2011   Objective: Vital Signs: Blood pressure 161/75, pulse 57, temperature 98.2 F (36.8 C), temperature source Oral, resp. rate 20, height 5\' 8"  (1.727 m), weight 101.8 kg (224 lb 6.9 oz), SpO2 98.00%. Dg Chest 2 View  11/23/2011  *RADIOLOGY REPORT*  Clinical Data: Shortness of breath, effusion, weakness.  CHEST - 2 VIEW  Comparison: 11/21/2011  Findings: Prior median sternotomy.  Cardiomegaly.  Right dialysis catheters unchanged.  Bilateral pleural effusions and bibasilar opacities are again noted, unchanged.  No acute bony abnormality.  IMPRESSION: No significant change.   Original Report Authenticated By: Cyndie Chime, M.D.     Basename 11/22/11 1612  WBC 12.7*  HGB 8.1*  HCT 22.2*  PLT 566*    Basename 11/23/11 0630 11/22/11 1612  NA 137 130*  K 4.2 3.8  CL 98 91*  CO2 31 26  GLUCOSE 93 125*  BUN 19 39*  CREATININE 4.38* 6.45*  CALCIUM 8.5 8.8   CBG (last 3)   Basename 11/24/11 0727 11/23/11 2034 11/23/11 1654  GLUCAP 103* 112* 108*    Wt Readings from Last 3 Encounters:  11/24/11 101.8 kg (224 lb 6.9 oz)  11/16/11 103.7 kg (228 lb 9.9 oz)  11/16/11 103.7 kg (228 lb 9.9 oz)    Physical Exam:  Constitutional: He is oriented to person, place, and time. He appears well-developed.  HENT: mucosa pink and moist  Head: Normocephalic.  Eyes:  Pupils round and  reactive to light  Neck: Neck supple. No thyromegaly present.  Cardiovascular: Normal rate and regular rhythm. Legs with 2+ edema. Trace UE edema Pulmonary/Chest: increased breath sounds at the right base.Marland Kitchen He has no wheezes. Appears more comfortable today. Abdominal: Soft. Bowel sounds are normal. He exhibits no distension.  Neurological: He is alert and oriented to person, place, and time. resasonable memory, insight, and awareness.  Follows three-step commands. Strength grossly 4/5 in the upper ext and 3-4 in the lower ext's  Skin:   . Chest/abd incision clean and intact with staples.   Psychiatric: He has a normal mood and affect.  Reduced sensation right second third digits seems improved.    Assessment/Plan: 1. Functional deficits secondary to deconditioning after aortic aneurysm repair/mediastinal exploration, multiple medical issues which require 3+ hours per day of interdisciplinary therapy in a comprehensive inpatient rehab setting. Physiatrist is providing close team supervision and 24 hour management of active medical problems listed below. Physiatrist and rehab team continue to assess barriers to discharge/monitor patient progress toward functional and medical goals. FIM: FIM - Bathing Bathing Steps Patient Completed: Chest;Right Arm;Left Arm;Abdomen;Front perineal area;Right upper leg;Left upper leg;Buttocks;Right lower leg (including foot);Left lower leg (including foot) Bathing: 5: Set-up assist to: Obtain items  FIM - Upper Body Dressing/Undressing Upper body dressing/undressing steps patient completed: Thread/unthread right sleeve of pullover shirt/dresss;Thread/unthread left sleeve of pullover shirt/dress;Put head through opening of pull  over shirt/dress;Pull shirt over trunk Upper body dressing/undressing: 5: Set-up assist to: Obtain clothing/put away FIM - Lower Body Dressing/Undressing Lower body dressing/undressing steps patient completed: Thread/unthread right  underwear leg;Thread/unthread left underwear leg;Pull underwear up/down;Thread/unthread right pants leg;Thread/unthread left pants leg;Pull pants up/down;Don/Doff right shoe;Don/Doff left shoe;Don/Doff right sock;Don/Doff left sock Lower body dressing/undressing: 5: Set-up assist to: Obtain clothing  FIM - Toileting Toileting steps completed by patient: Adjust clothing prior to toileting;Performs perineal hygiene;Adjust clothing after toileting Toileting Assistive Devices: Grab bar or rail for support Toileting: 5: Supervision: Safety issues/verbal cues  FIM - Diplomatic Services operational officer Devices: Grab bars Toilet Transfers: 0-Activity did not occur  FIM - Games developer Transfer: 6: Supine > Sit: No assist;6: Sit > Supine: No assist;5: Bed > Chair or W/C: Supervision (verbal cues/safety issues);5: Chair or W/C > Bed: Supervision (verbal cues/safety issues)  FIM - Locomotion: Wheelchair Distance: Total A secondary to sternal precautions Locomotion: Wheelchair: 0: Activity did not occur FIM - Locomotion: Ambulation Locomotion: Ambulation Assistive Devices: Designer, industrial/product Ambulation/Gait Assistance: 5: Supervision Locomotion: Ambulation: 5: Travels 150 ft or more with supervision/safety issues  Comprehension Comprehension Mode: Auditory Comprehension: 6-Follows complex conversation/direction: With extra time/assistive device  Expression Expression Mode: Verbal Expression: 6-Expresses complex ideas: With extra time/assistive device  Social Interaction Social Interaction: 6-Interacts appropriately with others with medication or extra time (anti-anxiety, antidepressant).  Problem Solving Problem Solving: 5-Solves complex 90% of the time/cues < 10% of the time  Memory Memory: 5-Recognizes or recalls 90% of the time/requires cueing < 10% of the time  Medical Problem List and Plan:  1. Deconditioning following emergency aortic aneurysm repair and  mediastinal exploration  2. 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 Monitor for any bleeding episodes, coumadin adjustment per pharmacy with avelox initiation. 3. Pain Management: Oxycodone as needed. Monitor with increased mobility  4. Neuropsych: This patient is capable of making decisions on his/her own behalf.  5.Renal failure secondary to contrast nephropathy and ATN. Hemodialysis initiated.  Marland Kitchen   -TEDS, elevation for edema control  -HD per nephrology 6. Hypertension. Cardizem. 90 mg every 12 hours, amiodarone 200 mg twice a day. Lopressor initiated yesterday.     7. Postoperative anemia. Latest hemoglobin 8.0 Transfuse as needed. Patient denies any chest pain or increased shortness of breath. ?epo candidate 8. Shortness of Breath:      -follow up CXR with improvement  -surgery following  -better after HD  -robitussin for occasional cough  -O2 via [place]  -improved clinically today  -cpap has been helpful for breathing at night/sleep 9. Constipation:   -metamucil, docusate  LOS (Days) 8 A FACE TO FACE EVALUATION WAS PERFORMED  Rogelia Boga 11/24/2011, 9:08 AM

## 2011-11-24 NOTE — Progress Notes (Signed)
ANTICOAGULATION CONSULT NOTE - Follow Up Consult  Pharmacy Consult for Coumadin Indication: pulmonary embolus  No Known Allergies  Patient Measurements: Height: 5\' 8"  (172.7 cm) (per patient) Weight: 224 lb 6.9 oz (101.8 kg) IBW/kg (Calculated) : 68.4   Vital Signs: Temp: 98.2 F (36.8 C) (10/19 0422) Temp src: Oral (10/19 0422) BP: 161/75 mmHg (10/19 0422) Pulse Rate: 57  (10/19 0422)  Labs:  Basename 11/24/11 0600 11/23/11 0630 11/22/11 1612 11/22/11 0640  HGB -- -- 8.1* --  HCT -- -- 22.2* --  PLT -- -- 566* --  APTT -- -- -- --  LABPROT 25.3* 25.0* -- 22.4*  INR 2.43* 2.39* -- 2.06*  HEPARINUNFRC -- -- -- --  CREATININE -- 4.38* 6.45* --  CKTOTAL -- -- -- --  CKMB -- -- -- --  TROPONINI -- -- -- --    Estimated Creatinine Clearance: 22.3 ml/min (by C-G formula based on Cr of 4.38).  Assessment: 56 year old male s/p repair AAA 9/24, complicated post-op course including PE 9/28. Patient underwent emergent sternotomy removing sternal wires/cardiac tamponade 11/04/2011. A chest tube was placed. Patient was extubated 11/10/2011. Developed acute renal failure creatinine increased to 6.22 likely ATN from dye exposure during TAA repair and started on CVVHD.   Pt continues on coumadin for PE. Lovenox has been stopped. INR today remains therapeutic at 2.43. His dose was held 10/15 (and likely the night before) due to rechecked INR increase in the afternoon. INR continues to increase slowly.  Goal of Therapy:  INR 2-2.5   Plan:  1. Coumadin 3 mg PO x 1 tonight 2. F/u AM INR  Lysle Pearl, PharmD, BCPS Pager # [phone] 11/24/2011 10:01 AM

## 2011-11-24 NOTE — Progress Notes (Signed)
Occupational Therapy Session Note  Patient Details  Name: ASAAD GULLEY MRN: [id number] Date of Birth: 1955/02/15  Today's Date: 11/24/2011  Skilled Therapeutic Interventions/Progress Updates: Patient scheduled for 45 min OT session this afternoon.  He spoke with his nurse to see if he could miss this appointment in order to get to dialysis earlier.  Patient indicated he felt dialysis was of greater importance to him today.  Explained that as a patient, he had the right to refuse his treatment.      See FIM for current functional status   Collier Salina 11/24/2011, 3:03 PM

## 2011-11-24 NOTE — Procedures (Signed)
I have personally attended patient's dialysis procedure. Using permcath Qb 400. Goal is 6300 - gains are still excessive between treatments despite counseling Pre weight 103.5 kg No evidence for recovery as of yet (uop trivial - 100 cc recorded) Dialysis dependent since 8/13 Pre-HD Labs below [facility] 11/24/11 1049  NA 130*  K 4.3  CL 93*  CO2 28  GLUCOSE 120*  BUN 39*  CREATININE 6.75*  CALCIUM 9.1  MG --  PHOS 3.4   Liver Function Tests:  Basename 11/24/11 1049  AST --  ALT --  ALKPHOS --  BILITOT --  PROT --  ALBUMIN 2.4*   Basename 11/24/11 1049  WBC 7.7  NEUTROABS --  HGB 8.0*  HCT 25.1*  MCV 88.7  PLT 241  Katie Moch B

## 2011-11-25 ENCOUNTER — Inpatient Hospital Stay (HOSPITAL_COMMUNITY): Payer: PRIVATE HEALTH INSURANCE

## 2011-11-25 ENCOUNTER — Inpatient Hospital Stay (HOSPITAL_COMMUNITY): Payer: PRIVATE HEALTH INSURANCE | Admitting: Physical Therapy

## 2011-11-25 LAB — PROTIME-INR
INR: 2.39 — ABNORMAL HIGH (ref 0.00–1.49)
Prothrombin Time: 25 seconds — ABNORMAL HIGH (ref 11.6–15.2)

## 2011-11-25 LAB — GLUCOSE, CAPILLARY: Glucose-Capillary: 132 mg/dL — ABNORMAL HIGH (ref 70–99)

## 2011-11-25 MED ORDER — WARFARIN SODIUM 3 MG PO TABS
3.0000 mg | ORAL_TABLET | Freq: Every day | ORAL | Status: DC
  Administered 2011-11-25 – 2011-11-26 (×2): 3 mg via ORAL
  Filled 2011-11-25 (×3): qty 1

## 2011-11-25 NOTE — Progress Notes (Signed)
ANTICOAGULATION CONSULT NOTE - Follow Up Consult  Pharmacy Consult for Coumadin Indication: pulmonary embolus  No Known Allergies  Patient Measurements: Height: 5\' 8"  (172.7 cm) (per patient) Weight: 214 lb 8.1 oz (97.3 kg) IBW/kg (Calculated) : 68.4   Vital Signs: Temp: 98.1 F (36.7 C) (10/20 0448) Temp src: Oral (10/20 0448) BP: 165/81 mmHg (10/20 0744) Pulse Rate: 69  (10/20 0744)  Labs:  Basename 11/25/11 0600 11/24/11 1049 11/24/11 0600 11/23/11 0630 11/22/11 1612  HGB -- 8.0* -- -- 8.1*  HCT -- 25.1* -- -- 22.2*  PLT -- 241 -- -- 566*  APTT -- -- -- -- --  LABPROT 25.0* -- 25.3* 25.0* --  INR 2.39* -- 2.43* 2.39* --  HEPARINUNFRC -- -- -- -- --  CREATININE -- 6.75* -- 4.38* 6.45*  CKTOTAL -- -- -- -- --  CKMB -- -- -- -- --  TROPONINI -- -- -- -- --    Estimated Creatinine Clearance: 14.2 ml/min (by C-G formula based on Cr of 6.75).  Assessment: 56 year old male s/p repair AAA 9/24, complicated post-op course including PE 9/28. Patient underwent emergent sternotomy removing sternal wires/cardiac tamponade 11/04/2011. A chest tube was placed. Patient was extubated 11/10/2011. Developed acute renal failure creatinine increased to 6.22 likely ATN from dye exposure during TAA repair and started on CVVHD.   Pt continues on coumadin for PE. Lovenox has been stopped. INR today remains therapeutic at 2.39. His dose was held 10/15 (and likely the night before) due to rechecked INR increase in the afternoon. INR has remain stable the last few days on 3mg .  Goal of Therapy:  INR 2-2.5   Plan:  1. Coumadin 3 mg PO daily for now 2. F/u AM INR  Lysle Pearl, PharmD, BCPS Pager # [phone] 11/25/2011 9:38 AM

## 2011-11-25 NOTE — Progress Notes (Signed)
[facility] Progress Note  Subjective:  Concerned about elevated BP.  No problems on HD Saturday.  Feet swell if he doesn't keep them elevated.  Objective Filed Vitals:   11/24/11 2333 11/25/11 0448 11/25/11 0622 11/25/11 0744  BP:  181/91 181/82 165/81  Pulse: 64 61  69  Temp:  98.1 F (36.7 C)    TempSrc:  Oral    Resp: 18 20    Height:      Weight:  97.3 kg (214 lb 8.1 oz)    SpO2:  96%     Physical Exam General: Sleeping, rouses easily Heart: RRR Lungs: decreased at bases right > left Abdomen: obese soft Extremities: 1+ edema - supine Dialysis Access: right I-J catheteter I/O last 3 completed shifts: In: 525 [P.O.:500; Other:25] Out: 5650 [Urine:150; Other:5500] Total I/O In: 120 [P.O.:120] Out: -    Assessment/Plan:   56 y/o gentleman S/P emergency repair type A ascending aortic dissection 10/30/11 with emergency sternotomy for tamponade 9/28, sternal closure 10/1 and AKI in that setting (plus IV contrast_  1. Dialysis dependent oliguric AKI in the setting above plus contrast plus ACE inhibitor  (Renal arteries were patent on contrasted imaging prior to aneurysm repair)  Dialysis dependent since 11/06/11 (initially CRRT then transition to HD); No evidence yet of renal recovery  Continue HD TTS schedule (OK'd for outpt HD with "acute renal failure dx" at [facility] at discharge)  2. Anemia - Hgb stable ~8 Aranesp 100 started 8/15; IV Fe course started 10/17 4. Secondary hyperparathyroidism - binders; P controlled 5. HTN/volume - still hypertensive with excess volume; continue current meds; Challenging weight a little more with each treatment; Net UF Saturday was 5.5 L with post wt of 97.3 6. Nutrition - high protein renal diet; add ONSP at outpt unit; had many questions; discussed with pt and daughter diet and fluid restrictions.  Dr. Kirtland Bouchard ordered RD consult; he will also receive counseling at his outpt HD unit 7. PE - on coumadin; INR  therapeutic - who will monitor after discharge 8. Atrial fibrilation - on cardizem, amio and coumadin - [facility]?  9. Disp - anticipate d/c Tuesday after HD  Sheffield Slider, PA-C [facility] Beeper [phone]  11/25/2011,11:04 AM  LOS: 9 days  Agree with note and assessment of MBergman above with highlighted additions.  Remains oliguric and dialysis dependent since 11/06/11.  Gently challenging weight with each treatment.  Anticipated D/C date is Tuesday after HD.  Has spot at [facility].  Marzell Isakson B   Additional Objective Labs: Lab Results  Component Value Date   INR 2.39* 11/25/2011   INR 2.43* 11/24/2011   INR 2.39* 11/23/2011    Basic Metabolic Panel:  Lab 11/24/11 4696 11/23/11 0630 11/22/11 1612  NA 130* 137 130*  K 4.3 4.2 3.8  CL 93* 98 91*  CO2 28 31 26   GLUCOSE 120* 93 125*  BUN 39* 19 39*  CREATININE 6.75* 4.38* 6.45*  CALCIUM 9.1 8.5 8.8  ALB -- -- --  PHOS 3.4 2.7 3.2   Liver Function Tests:  Lab 11/24/11 1049 11/23/11 0630 11/22/11 1612  AST -- -- --  ALT -- -- --  ALKPHOS -- -- --  BILITOT -- -- --  PROT -- -- --  ALBUMIN 2.4* 2.3* 2.5*   CBC:  Lab 11/24/11 1049 11/22/11 1612 11/20/11 1705 11/20/11 0635 11/19/11 0606  WBC 7.7 12.7* 10.4 -- --  NEUTROABS -- -- -- -- --  HGB 8.0*  8.1* 8.0* -- --  HCT 25.1* 22.2* 24.2* -- --  MCV 88.7 86.7 86.4 87.1 86.2  PLT 241 566* 287 -- --   CBG:  Lab 11/25/11 0726 11/24/11 2048 11/24/11 1547 11/24/11 0727 11/23/11 2034  GLUCAP 99 115* 84 103* 112*  Studies/Results: Dg Chest 2 View  11/25/2011  *RADIOLOGY REPORT*  Clinical Data: Aortic repair  CHEST - 2 VIEW  Comparison: 11/23/2011; 11/21/2011  Findings: Grossly unchanged enlarged cardiac silhouette and mediastinal contours post median sternotomy.  Stable position of support apparatus.  Lung volumes remain reduced.  Grossly unchanged small bilateral pleural effusions both of which layer along the bilateral chest wall.  Unchanged  bibasilar opacities.  The bony venous congestion without frank evidence of edema.  Unchanged bones. Right axillary surgical clips.  IMPRESSION: Unchanged small bilateral, possibly partially loculated, pleural effusions and bibasilar opacities, atelectasis versus scar.   Original Report Authenticated By: Waynard Reeds, M.D.   Medications:      . amiodarone  200 mg Oral BID  . bisacodyl  10 mg Rectal Once  . calcium acetate  1,334 mg Oral TID WC  . cloNIDine  0.1 mg Oral BID  . darbepoetin (ARANESP) injection - DIALYSIS  100 mcg Intravenous Q Tue-HD  . diltiazem  90 mg Oral Q12H  . docusate sodium  200 mg Oral BID  . ferric gluconate (FERRLECIT/NULECIT) IV  125 mg Intravenous Q T,Th,Sa-HD  . metoprolol tartrate  25 mg Oral BID  . multivitamin  1 tablet Oral QHS  . pantoprazole  40 mg Oral Q1200  . psyllium  1 packet Oral Daily  . warfarin  3 mg Oral ONCE-1800  . warfarin  3 mg Oral q1800  . Warfarin - Pharmacist Dosing Inpatient   Does not apply [phone]

## 2011-11-25 NOTE — Progress Notes (Signed)
Physical Therapy Note  Patient Details  Name: Gabriel Tran MRN: [id number] Date of Birth: May 10, 1955 Today's Date: 11/25/2011  0900-0940 (40 minutes) individual Pain: No complaint of pain Focus of treatment: Gait for endurance; therapeutic exercises to improve activity tolerance, bilateral LE strengthening, standing balance Treatment: gait to/from room to gym 120 feet + SBA (3 L oxygen [place]); alternate stepping to 6 inch step SBA with no loss of balance ;tandem stance without loss of balance X 10 seconds; single leg stance X 5 seconds without UE support; heel/toe raises, up/down step X 10 for quad strengthening, Nustep Level 5 LEs only X 8 minutes. Oxygen sats > 92% throughout session.    Quandra Fedorchak,JIM 11/25/2011, 7:50 AM

## 2011-11-25 NOTE — Progress Notes (Signed)
Patient ID: Gabriel Tran, male   DOB: 06-07-1955, 56 y.o.   MRN: [id number] Patient ID: Gabriel Tran, male   DOB: 07/07/1955, 56 y.o.   MRN: [id number] Subjective/Complaints: 10/20.  No new complaints.  Multiple questions concerning renal diet;  Tolerated cpap again. Believes if he derives clinical benefit from CPAP;  Requesting Grounds pa andss   A 12 point review of systems has been performed and if not noted above is otherwise negative. HEENT-nasal cannula oxygen in place Chest-clear; CV- regular; Abd- benign; Extr- edema improved  CBG (last 3)   Basename 11/25/11 0726 11/24/11 2048 11/24/11 1547  GLUCAP 99 115* 84     Lab Results  Component Value Date   INR 2.39* 11/25/2011   INR 2.43* 11/24/2011   INR 2.39* 11/23/2011   Objective: Vital Signs: Blood pressure 165/81, pulse 69, temperature 98.1 F (36.7 C), temperature source Oral, resp. rate 20, height 5\' 8"  (1.727 m), weight 97.3 kg (214 lb 8.1 oz), SpO2 96.00%. Dg Chest 2 View  11/25/2011  *RADIOLOGY REPORT*  Clinical Data: Aortic repair  CHEST - 2 VIEW  Comparison: 11/23/2011; 11/21/2011  Findings: Grossly unchanged enlarged cardiac silhouette and mediastinal contours post median sternotomy.  Stable position of support apparatus.  Lung volumes remain reduced.  Grossly unchanged small bilateral pleural effusions both of which layer along the bilateral chest wall.  Unchanged bibasilar opacities.  The bony venous congestion without frank evidence of edema.  Unchanged bones. Right axillary surgical clips.  IMPRESSION: Unchanged small bilateral, possibly partially loculated, pleural effusions and bibasilar opacities, atelectasis versus scar.   Original Report Authenticated By: Waynard Reeds, M.D.    Dg Chest 2 View  11/23/2011  *RADIOLOGY REPORT*  Clinical Data: Shortness of breath, effusion, weakness.  CHEST - 2 VIEW  Comparison: 11/21/2011  Findings: Prior median sternotomy.  Cardiomegaly.  Right dialysis catheters  unchanged.  Bilateral pleural effusions and bibasilar opacities are again noted, unchanged.  No acute bony abnormality.  IMPRESSION: No significant change.   Original Report Authenticated By: Cyndie Chime, M.D.     Basename 11/24/11 1049 11/22/11 1612  WBC 7.7 12.7*  HGB 8.0* 8.1*  HCT 25.1* 22.2*  PLT 241 566*    Basename 11/24/11 1049 11/23/11 0630  NA 130* 137  K 4.3 4.2  CL 93* 98  CO2 28 31  GLUCOSE 120* 93  BUN 39* 19  CREATININE 6.75* 4.38*  CALCIUM 9.1 8.5   CBG (last 3)   Basename 11/25/11 0726 11/24/11 2048 11/24/11 1547  GLUCAP 99 115* 84    Wt Readings from Last 3 Encounters:  11/25/11 97.3 kg (214 lb 8.1 oz)  11/16/11 103.7 kg (228 lb 9.9 oz)  11/16/11 103.7 kg (228 lb 9.9 oz)    Physical Exam:  Constitutional: He is oriented to person, place, and time. He appears well-developed.  HENT: mucosa pink and moist  Head: Normocephalic.  Eyes:  Pupils round and reactive to light  Neck: Neck supple. No thyromegaly present.  Cardiovascular: Normal rate and regular rhythm. Legs with 2+ edema. Trace UE edema Pulmonary/Chest: increased breath sounds at the right base.Marland Kitchen He has no wheezes. Appears more comfortable today. Abdominal: Soft. Bowel sounds are normal. He exhibits no distension.  Neurological: He is alert and oriented to person, place, and time. resasonable memory, insight, and awareness.  Follows three-step commands. Strength grossly 4/5 in the upper ext and 3-4 in the lower ext's  Skin:   . Chest/abd incision clean and intact with staples.  Psychiatric: He has a normal mood and affect.  Reduced sensation right second third digits seems improved.    Assessment/Plan: 1. Functional deficits secondary to deconditioning after aortic aneurysm repair/mediastinal exploration, multiple medical issues which require 3+ hours per day of interdisciplinary therapy in a comprehensive inpatient rehab setting. Physiatrist is providing close team supervision and 24  hour management of active medical problems listed below. Physiatrist and rehab team continue to assess barriers to discharge/monitor patient progress toward functional and medical goals. FIM: FIM - Bathing Bathing Steps Patient Completed: Chest;Right Arm;Left Arm;Abdomen;Front perineal area;Right upper leg;Left upper leg;Buttocks;Right lower leg (including foot);Left lower leg (including foot) Bathing: 5: Set-up assist to: Obtain items  FIM - Upper Body Dressing/Undressing Upper body dressing/undressing steps patient completed: Thread/unthread right sleeve of pullover shirt/dresss;Thread/unthread left sleeve of pullover shirt/dress;Put head through opening of pull over shirt/dress;Pull shirt over trunk Upper body dressing/undressing: 5: Set-up assist to: Obtain clothing/put away FIM - Lower Body Dressing/Undressing Lower body dressing/undressing steps patient completed: Thread/unthread right underwear leg;Thread/unthread left underwear leg;Pull underwear up/down;Thread/unthread right pants leg;Thread/unthread left pants leg;Pull pants up/down;Don/Doff right shoe;Don/Doff left shoe;Don/Doff right sock;Don/Doff left sock Lower body dressing/undressing: 5: Set-up assist to: Obtain clothing  FIM - Toileting Toileting steps completed by patient: Adjust clothing prior to toileting;Performs perineal hygiene;Adjust clothing after toileting Toileting Assistive Devices: Grab bar or rail for support Toileting: 5: Supervision: Safety issues/verbal cues  FIM - Diplomatic Services operational officer Devices: Grab bars Toilet Transfers: 0-Activity did not occur  FIM - Games developer Transfer: 6: Supine > Sit: No assist;6: Sit > Supine: No assist;5: Bed > Chair or W/C: Supervision (verbal cues/safety issues);5: Chair or W/C > Bed: Supervision (verbal cues/safety issues)  FIM - Locomotion: Wheelchair Distance: Total A secondary to sternal precautions Locomotion: Wheelchair: 0: Activity  did not occur FIM - Locomotion: Ambulation Locomotion: Ambulation Assistive Devices: Designer, industrial/product Ambulation/Gait Assistance: 5: Supervision Locomotion: Ambulation: 5: Travels 150 ft or more with supervision/safety issues  Comprehension Comprehension Mode: Auditory Comprehension: 6-Follows complex conversation/direction: With extra time/assistive device  Expression Expression Mode: Verbal Expression: 6-Expresses complex ideas: With extra time/assistive device  Social Interaction Social Interaction: 6-Interacts appropriately with others with medication or extra time (anti-anxiety, antidepressant).  Problem Solving Problem Solving: 5-Solves complex 90% of the time/cues < 10% of the time  Memory Memory: 5-Recognizes or recalls 90% of the time/requires cueing < 10% of the time  Medical Problem List and Plan:  1. Deconditioning following emergency aortic aneurysm repair and mediastinal exploration  2. 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 Monitor for any bleeding episodes, coumadin adjustment per pharmacy with avelox initiation. 3. Pain Management: Oxycodone as needed. Monitor with increased mobility  4. Neuropsych: This patient is capable of making decisions on his/her own behalf.  5.Renal failure secondary to contrast nephropathy and ATN. Hemodialysis initiated.  Marland Kitchen   -TEDS, elevation for edema control  -HD per nephrology 6. Hypertension. Cardizem. 90 mg every 12 hours, amiodarone 200 mg twice a day. Lopressor initiated yesterday.     7. Postoperative anemia. Latest hemoglobin 8.0 Transfuse as needed. Patient denies any chest pain or increased shortness of breath. ?epo candidate 8. Shortness of Breath:      -follow up CXR with improvement  -surgery following  -better after HD  -robitussin for occasional cough  -O2 via [place]  -improved clinically today  -cpap has been helpful for breathing at night/sleep 9. Constipation:    -metamucil, docusate  LOS (Days) 9 A FACE TO FACE EVALUATION  WAS PERFORMED  Rogelia Boga 11/25/2011, 8:27 AM

## 2011-11-26 ENCOUNTER — Inpatient Hospital Stay (HOSPITAL_COMMUNITY): Payer: PRIVATE HEALTH INSURANCE | Admitting: Speech Pathology

## 2011-11-26 ENCOUNTER — Inpatient Hospital Stay (HOSPITAL_COMMUNITY): Payer: Managed Care, Other (non HMO) | Admitting: Physical Therapy

## 2011-11-26 ENCOUNTER — Inpatient Hospital Stay (HOSPITAL_COMMUNITY): Payer: PRIVATE HEALTH INSURANCE

## 2011-11-26 LAB — PROTIME-INR: Prothrombin Time: 26.3 seconds — ABNORMAL HIGH (ref 11.6–15.2)

## 2011-11-26 LAB — GLUCOSE, CAPILLARY
Glucose-Capillary: 107 mg/dL — ABNORMAL HIGH (ref 70–99)
Glucose-Capillary: 105 mg/dL — ABNORMAL HIGH (ref 70–99)

## 2011-11-26 MED ORDER — CALCIUM ACETATE 667 MG PO CAPS
667.0000 mg | ORAL_CAPSULE | Freq: Three times a day (TID) | ORAL | Status: DC
  Administered 2011-11-26 – 2011-11-27 (×2): 667 mg via ORAL
  Filled 2011-11-26 (×5): qty 1

## 2011-11-26 NOTE — Discharge Summary (Signed)
Gabriel Tran, Gabriel Tran            ACCOUNT NO.:  [id number]  MEDICAL RECORD NO.:  [id number]  LOCATION:  4004                         FACILITY:  [facility]  PHYSICIAN:  Ranelle Oyster, M.D.DATE OF BIRTH:  01-03-1956  DATE OF ADMISSION:  11/16/2011 DATE OF DISCHARGE:  11/27/2011                              DISCHARGE SUMMARY   DISCHARGE DIAGNOSES: 1. Deconditioning following emergency aortic aneurysm repair with     mediastinal exploration. 2. Chronic Coumadin for postoperative pulmonary emboli, pain     management, renal failure secondary to contrast nephropathy and     ATN, hypertension, postoperative anemia, shortness of breath. 3. Sleep apnea  This is a 56 year old right-handed male ex-smoker as well as history of hypertension admitted September 23 with chest pain radiating to the neck.  The patient was active and independent prior to admission.  Noted blood pressure 230/110.  EKG revealed right bundle branch block.  Chest x-ray showed no acute findings.  Cardiac enzymes negative.  Cranial CT scan negative for acute abnormalities.  The patient found to have acute type A ascending aortic dissection with moderate aortic insufficiency and underwent emergent repair of ascending aortic dissection November 01, 2011, per Dr. Donata Clay.  HOSPITAL COURSE:  With increasing shortness of breath, ventilation perfusion scan completed showing intermediate probability for pulmonary embolism.  He was placed on low-dose heparin infusion without bolus. The patient became more agitated, developed pulseless electrical activity, became hypotensive immediately, given CPR and was intubated. Underwent emergent sternotomy removing sternal wires, cardiac tamponade November 04, 2011.  A chest tube was placed.  He was extubated November 10, 2011.  Developed acute renal failure, creatinine increased to 6.22, likely secondary to ATN from dye exposure during TAA repair and started on hemodialysis.  Coumadin  was since initiated for pulmonary emboli and monitoring for any bleeding episodes.  His diet was advanced to a regular consistency.  He was admitted for comprehensive rehab program.  PAST MEDICAL HISTORY:  See discharge diagnoses.  SOCIAL HISTORY:  Lives with spouse.  Functional history prior to admission was independent.  Functional status upon admission to rehab services was +2 total assist to ambulate 100 feet with a rolling walker.  PHYSICAL EXAMINATION:  VITAL SIGNS:  Blood pressure 154/73, pulse 89, temperature 97, respirations 20. GENERAL:  This was an alert male, in no acute distress, followed 3 step commands.  He was oriented to person, place, time, reasonable memory and insight of his awareness. LUNGS:  Decreased breath sounds at the bases without wheezes. CARDIAC:  Regular rate and rhythm. ABDOMEN:  Soft, nontender.  Good bowel sounds.  REHABILITATION HOSPITAL COURSE:  The patient was admitted to inpatient rehab services with therapies initiated on a 3-hour daily basis consisting of physical therapy, occupational therapy, and rehabilitation nursing.  The following issues were addressed during the patient's rehabilitation stay.  Pertaining to Mr. Rosinski's deconditioning following emergency aortic aneurysm repair, he received followup for per Cardiovascular Surgery, Dr. Donata Clay.  The patient now maintained on chronic Coumadin therapy for findings of pulmonary emboli with no bleeding episodes.  Latest INR of 2.56 with a goal INR of 2.0-3.0.  The patient's primary MD Dr. Crawford Givens was contacted to follow Coumadin therapy  upon discharge and plan was to check INR on Thursday November 29, 2011, with results to Dr. Para March at [phone] with a home health nurse arranged.  He continued on dialysis as per Renal Services with latest creatinine 6.75 with ongoing care as dictated at discharge.  His blood pressures remained well controlled with no orthostatic changes.  He continued  on clonidine as well as Cardizem with amiodarone as well as Lopressor.  He had no chest pain or shortness of breath throughout his rehab course.  Pain management with the use of oxycodone and good results.  HOSPITAL COURSE:  Anemia.  Latest hemoglobin of 8.0, hematocrit 25.1. He remained on Aranesp, he could receive during dialysis.  He was followed closely by Cardiovascular Surgery for unchanged small bilateral pleural effusions, bibasilar opacities.  His oxygen saturations remained 90%.  It was noted during his hospital course bouts of sleep apnea.  The patient had noted approximately 1 year ago he was to have a sleep study which he did not attend.  He was placed on CPAP which he did very well with.  Arrangements were made for outpatient sleep study and CPAP would be ongoing at time of discharge pending insurance.  The patient received weekly collaborative interdisciplinary team conferences to discuss estimated length of stay, family teaching, and any barriers to his discharge.  He was ambulating extended distances with therapies using an assistive device, supervision for activities of daily living.  His strength and endurance continued to improve throughout his rehab course and family teaching was completed.  He was advised no driving at time of discharge.  DISCHARGE MEDICATIONS:  At the time of dictation included Xanax 0.5 mg every 6 hours as needed for anxiety, oxycodone immediate release 5-10 mg every 3-4 hours as needed pain, amiodarone 200 mg twice daily, PhosLo 1334 mg t.i.d., clonidine 0.1 mg b.i.d., Aranesp 100 mcg every 2 weeks with dialysis, Cardizem 90 mg every 12 hours, ferric gluconate Tuesday, Thursday, Saturday with dialysis, Lopressor 25 mg b.i.d., Rena-Vite 1 tablet at bedtime, Protonix 40 mg daily, psyllium 1 packet p.o. daily, Coumadin latest dose of 3 mg adjusted accordingly for an INR of 2.0 to 3.0.  DIET:  Renal diet.  SPECIAL INSTRUCTIONS:  Home health nurse  to check INR on Thursday November 29, 2011, with results to Dr. Crawford Givens at [facility] [place], [place] [phone], fax number [phone].  Continue CPAP at bedtime as well as oxygen therapy.  The patient should follow up with Dr. Kerin Perna, Cardiothoracic Surgery on December 05, 2011.  Dr. Crawford Givens call for appointment.  Dr. Delano Metz 1 week.  Continue dialysis therapy as advised.  Dr. Cassell Clement, Cardiology Services as needed.  Dr. Harold Hedge as needed.  Special instructions, continue dialysis as advised per Renal Services.  Home therapies had been arranged.     Mariam Dollar, P.A.   ______________________________ Ranelle Oyster, M.D.    DA/MEDQ  D:  11/26/2011  T:  11/26/2011  Job:  [id number]  cc:   Dwana Curd. Para March, M.D. Kerin Perna, M.D. Maree Krabbe, M.D. Cassell Clement, M.D. Ranelle Oyster, M.D.

## 2011-11-26 NOTE — Progress Notes (Signed)
Occupational Therapy Session Note  Patient Details  Name: Gabriel Tran MRN: [id number] Date of Birth: December 08, 1955  Today's Date: 11/26/2011 Time: 0800-0859 Time Calculation (min): 59 min  Short Term Goals: Week 2:  OT Short Term Goal 1 (Week 2): Pt. will transfer to toilet with mod I OT Short Term Goal 2 (Week 2): Pt. will perform light household task for 10 mins. at standing level at mod I level OT Short Term Goal 3 (Week 2): Pt will perform bathing and dressing tasks with mod I  Skilled Therapeutic Interventions/Progress Updates:    Pt seated EOB upon arrival.  Pt stated he did not want take shower until dialysis port removed.  Pt amb in room without AD to gather clothing and bathing supplies.  Pt amb to bathroom for toileting.  Pt completed all bathing and dressing tasks as well as cleaning up supplies afterwards at mod I level.  Pt did not exhibit any LOB throughout session.  Pt required rest breaks X 4 during session when exhibiting SOB.  Focus on activity tolerance, standing balance, and safety awareness.  Therapy Documentation Precautions:  Precautions Precautions: Sternal;Fall Precaution Comments: PE, acute renal failure; on dialysis, sternal precautions, on blood thinners Restrictions Weight Bearing Restrictions: No General:   Pain: Pain Assessment Pain Assessment: No/denies pain  See FIM for current functional status  Therapy/Group: Individual Therapy  Rich Brave 11/26/2011, 9:03 AM

## 2011-11-26 NOTE — Discharge Summary (Signed)
Discharge summary job # [id number]

## 2011-11-26 NOTE — Progress Notes (Signed)
Physical Therapy Session Note  Patient Details  Name: Gabriel Tran MRN: [id number] Date of Birth: Sep 27, 1955  Today's Date: 11/26/2011 Time: 0930-1025 Time Calculation (min): 55 min  Short Term Goals: Week 1:  PT Short Term Goal 1 (Week 1): = LTG of mod I overall Week 2:     Skilled Therapeutic Interventions/Progress Updates:  Patient ambulated to gym (300+ feet) independently with assist only to pull O2 tank. Patient transferred independently into car and on/off double bed. Patient worked on endurance on NuStep x 5 minutes at work level 5 using LE's only. Patient reported BORG exertion scale of 13 following exercise. Patient retested on BERG for balance and scored 56 at low risk for falls. Patient performed 6 minute walk test and ambulated 777 feet in 6 minutes. Patient's O2 sats remained 97 to 99 throughout on 3 liters of O2. Patient ambulated 225 feet without O2 and sats were 95%. Patient ambulated back to room without oxygen (145 feet) and O2 sats were 95%.  Therapy Documentation Precautions:  Precautions Precautions: Sternal;Fall Precaution Comments: PE, acute renal failure; on dialysis, sternal precautions, on blood thinners Restrictions Weight Bearing Restrictions: No   Pain: Pain Assessment Pain Assessment: No/denies pain  Mobility: Bed Mobility Bed Mobility: Rolling Right;Rolling Left;Right Sidelying to Sit;Supine to Sit;Sit to Supine Rolling Right: 7: Independent Rolling Left: 7: Independent Right Sidelying to Sit: 7: Independent Supine to Sit: 7: Independent Sitting - Scoot to Edge of Bed: 7: Independent Sit to Supine: 7: Independent Transfers Sit to Stand: 7: Independent Stand to Sit: 7: Independent Stand Pivot Transfers: 7: Independent  Locomotion : Ambulation Ambulation/Gait Assistance: 7: Independent Ambulation Distance (Feet): 777 Feet Ambulation/Gait Assistance Details: Patient performed 6 minute walk test and ambulated 777 feet in six  minutes. Stairs / Additional Locomotion Stairs: Yes Stairs Assistance: 6: Modified independent (Device/Increase time) Stair Management Technique: Two rails Number of Stairs: 12    Trunk/Postural Assessment : Cervical Assessment Cervical Assessment: Within Functional Limits Thoracic Assessment Thoracic Assessment: Within Functional Limits Lumbar Assessment Lumbar Assessment: Within Functional Limits Postural Control Postural Control: Within Functional Limits   Balance: Balance Balance Assessed: Yes Standardized Balance Assessment Standardized Balance Assessment: Berg Balance Test Berg Balance Test Sit to Stand: Able to stand without using hands and stabilize independently Standing Unsupported: Able to stand safely 2 minutes Sitting with Back Unsupported but Feet Supported on Floor or Stool: Able to sit safely and securely 2 minutes Stand to Sit: Sits safely with minimal use of hands Transfers: Able to transfer safely, minor use of hands Standing Unsupported with Eyes Closed: Able to stand 10 seconds safely Standing Ubsupported with Feet Together: Able to place feet together independently and stand 1 minute safely From Standing, Reach Forward with Outstretched Arm: Can reach confidently >25 cm (10") From Standing Position, Pick up Object from Floor: Able to pick up shoe safely and easily From Standing Position, Turn to Look Behind Over each Shoulder: Looks behind from both sides and weight shifts well Turn 360 Degrees: Able to turn 360 degrees safely in 4 seconds or less Standing Unsupported, Alternately Place Feet on Step/Stool: Able to stand independently and safely and complete 8 steps in 20 seconds Standing Unsupported, One Foot in Front: Able to place foot tandem independently and hold 30 seconds Standing on One Leg: Able to lift leg independently and hold > 10 seconds Total Score: 56   See FIM for current functional status  Therapy/Group: Individual Therapy  Arelia Longest M 11/26/2011, 10:43 AM

## 2011-11-26 NOTE — Plan of Care (Signed)
Problem: Food- and Nutrition-Related Knowledge Deficit (NB-1.1) Goal: Nutrition education Formal process to instruct or train a patient/client in a skill or to impart knowledge to help patients/clients voluntarily manage or modify food choices and eating behavior to maintain or improve health.  Outcome: Completed/Met Date Met:  11/26/11 Nutrition Education Note  RD consulted for Renal Education. Provided Choose-A-Meal Booklet to patient/family. Reviewed food groups and provided written recommended serving sizes specifically determined for patient's current nutritional status.   Explained why diet restrictions are needed and provided lists of foods to limit/avoid that are high potassium, sodium, and phosphorus. Provided specific recommendations on safer alternatives of these foods. Strongly encouraged compliance of this diet.   Discussed importance of protein intake at each meal and snack. Provided examples of how to maximize protein intake throughout the day. Discussed need for fluid restriction with dialysis, importance of minimizing weight gain between HD treatments, and renal-friendly beverage options.  Encouraged pt to discuss specific diet questions/concerns with RD at HD outpatient facility.   Expect good compliance.  Body mass index is 32.68 kg/(m^2). Pt meets criteria for Obese Class I based on current BMI.  Current diet order is Renal, patient is consuming approximately 50 - 100% of meals at this time. Labs and medications reviewed. No further nutrition interventions warranted at this time. RD contact information provided. If additional nutrition issues arise, please re-consult RD.  Jarold Motto MS, RD, LDN Pager: [phone] After-hours pager: [phone]

## 2011-11-26 NOTE — Progress Notes (Signed)
Speech Language Pathology Daily Session Note  Patient Details  Name: Gabriel Tran MRN: [id number] Date of Birth: 11-Jun-1955  Today's Date: 11/26/2011 Time: 1478-2956 Time Calculation (min): 60 min  Short Term Goals: Week 1: SLP Short Term Goal 1 (Week 1): short term goals = long term goals  Skilled Therapeutic Interventions: Treatment focus on pt/family education. Pt and his wife educated on current cognitive function and strategies to utilize at home to increase organization and problem solving. Both demonstrated and verbalized understanding.  Pt independently demonstrated anticipatory awareness and asking appropriate questions for restrictions on his renal diet. Pt given handouts and verbalized understanding and recall.    FIM:  Comprehension Comprehension Mode: Auditory Comprehension: 6-Follows complex conversation/direction: With extra time/assistive device Expression Expression Mode: Verbal Expression: 6-Expresses complex ideas: With extra time/assistive device Social Interaction Social Interaction: 6-Interacts appropriately with others with medication or extra time (anti-anxiety, antidepressant). Problem Solving Problem Solving: 6-Solves complex problems: With extra time Memory Memory: 6-More than reasonable amt of time  Pain Pain Assessment Pain Assessment: No/denies pain  Therapy/Group: Individual Therapy  Bertha Lokken 11/26/2011, 3:55 PM

## 2011-11-26 NOTE — Progress Notes (Signed)
[facility] Progress Notes   Subjective:  Concerned about CPAP study not until nov. 17/No sob/" My urine output is still good"/ HD IN Am Scheduled for outpt HD at [facility] T-Th-Sat to begin on Thursday Objective Vital signs in last 24 hours: Filed Vitals:   11/26/11 0009 11/26/11 0513 11/26/11 0812 11/26/11 1129  BP:  163/86    Pulse: 68 55 62   Temp:  98.5 F (36.9 C)    TempSrc:  Oral    Resp: 18 22    Height:      Weight:  97.5 kg (214 lb 15.2 oz)    SpO2:  99%  90%   Weight change: -6 kg (-13 lb 3.6 oz)  Intake/Output Summary (Last 24 hours) at 11/26/11 1530 Last data filed at 11/26/11 1100  Gross per 24 hour  Intake   1277 ml  Output    205 ml  Net   1072 ml   Labs: Basic Metabolic Panel:  Lab 11/24/11 1610 11/23/11 0630 11/22/11 1612  NA 130* 137 130*  K 4.3 4.2 3.8  CL 93* 98 91*  CO2 28 31 26   GLUCOSE 120* 93 125*  BUN 39* 19 39*  CREATININE 6.75* 4.38* 6.45*  CALCIUM 9.1 8.5 8.8  ALB -- -- --  PHOS 3.4 2.7 3.2   Liver Function Tests:  Lab 11/24/11 1049 11/23/11 0630 11/22/11 1612  AST -- -- --  ALT -- -- --  ALKPHOS -- -- --  BILITOT -- -- --  PROT -- -- --  ALBUMIN 2.4* 2.3* 2.5*   No results found for this basename: LIPASE:3,AMYLASE:3 in the last 168 hours No results found for this basename: AMMONIA:3 in the last 168 hours CBC:  Lab 11/24/11 1049 11/22/11 1612 11/20/11 1705 11/20/11 0635  WBC 7.7 12.7* 10.4 --  NEUTROABS -- -- -- --  HGB 8.0* 8.1* 8.0* --  HCT 25.1* 22.2* 24.2* --  MCV 88.7 86.7 86.4 87.1  PLT 241 566* 287 --   Cardiac Enzymes: No results found for this basename: CKTOTAL:5,CKMB:5,CKMBINDEX:5,TROPONINI:5 in the last 168 hours CBG:  Lab 11/26/11 1130 11/26/11 0724 11/25/11 2035 11/25/11 1634 11/25/11 1150  GLUCAP 107* 96 132* 116* 94    Medications:      . amiodarone  200 mg Oral BID  . bisacodyl  10 mg Rectal Once  . calcium acetate  1,334 mg Oral TID WC  . cloNIDine  0.1 mg Oral BID  .  darbepoetin (ARANESP) injection - DIALYSIS  100 mcg Intravenous Q Tue-HD  . diltiazem  90 mg Oral Q12H  . docusate sodium  200 mg Oral BID  . ferric gluconate (FERRLECIT/NULECIT) IV  125 mg Intravenous Q T,Th,Sa-HD  . metoprolol tartrate  25 mg Oral BID  . multivitamin  1 tablet Oral QHS  . pantoprazole  40 mg Oral Q1200  . psyllium  1 packet Oral Daily  . warfarin  3 mg Oral q1800  . Warfarin - Pharmacist Dosing Inpatient   Does not apply q1800   I  have reviewed scheduled and prn medications.  Physical Exam: General: Alert. NAD., Appropriate, Wife in room Heart: RRR, no rub Lungs: BS decreased at bases  R> L,  Abdomen: Obese, soft, nontender Extremities: Dialysis Access: 1+ bipedal edema  /  R IJ Perm cath   Fe/TIBC 15% sat on 15 Oct, ferritin 870  Assessment/Plan:  56 y/o gentleman S/P emergency repair type A ascending aortic dissection 10/30/11 with emergency sternotomy for tamponade 9/28, sternal  closure 10/1 and AKI in that setting (plus IV contras  1. Dialysis dependent oliguric AKI in the setting above plus contrast plus ACE inhibitor  (Renal arteries were patent on contrasted imaging prior to aneurysm repair)  Dialysis dependent since 11/06/11 (initially CRRT then transition to HD); No evidence yet of renal recovery  Continue HD TTS schedule (OK'd for outpt HD with "acute renal failure dx" )at [facility] at discharge On MWF 12 noon/ this wed. Will only be 3 hr because of tx tomorrow/ will check labs weekly for eval for renal function recovery possibility.  Witrh edema, his EDW will be lower--wt today 97.5 kg.  EDW probably < 90 kg 2. Anemia - Hgb stable ~8.0 on Aranesp 100 started 8/15; IV Fe course started 10/17  4. Secondary hyperparathyroidism - binders; Phos 3.4 11/24/11 with ca 9.1 ( corrrected ~10.7) phoslo 2ac will fu am labs pre hd / will decrease to 1 Phoslo  With meals 5. HTN/volume - still hypertensive with excess volume; continue current meds;  Challenging weight a little more with each treatment; Net UF Saturday was 5.5 L with post wt of 97.3 / today wt 97.5 Dietitian in to speak to wife and husband now. Try for 95 kg tomorrow 6. Nutrition - high protein renal diet; add ONSP at outpt unit; RD Seeing pt. Now/ he will also receive counseling at his outpt HD unit  7. PE - on coumadin; INR therapeutic - who will monitor after discharge  8. Atrial fibrilation - on cardizem, amio and coumadin - [place] [facility]?  9. Disp - anticipate d/c Tuesday after HD  I saw, spoke with, and examined Gabriel Tran.  He still has lots of pretibial and pedal edema.  EDW will be adjusted down.  Outpt HD scheduled at [facility]  Agree with plans  Gabriel Pastel, Gabriel Tran [facility] Beeper [phone] 11/26/2011,3:30 PM  LOS: 10 days

## 2011-11-26 NOTE — Progress Notes (Signed)
Physical Therapy Discharge Summary  Patient Details  Name: Gabriel Tran MRN: [id number] Date of Birth: [birth year]  Today's Date: 11/26/2011 Time: 10:57-11:27 Time Calculation (min): 30 min  Patient has met 10 of 10 long term goals due to improved activity tolerance, improved balance, increased strength and improved endurance.  Patient to discharge at an ambulatory level Independent.   Patient's care partner unavailable to provide the necessary physical and cognitive assistance at discharge.  Reasons goals not met: NA  Recommendation:  Patient will benefit from ongoing skilled PT services in outpatient setting to continue to advance safe functional mobility, address ongoing impairments in endurance and strength, and minimize fall risk.  Outpatient cardiopulmonary rehab would be an excellent place for him to go for further rehabilitation.    Equipment: No equipment provided  Reasons for discharge: discharge from hospital  Patient/family agrees with progress made and goals achieved: Yes  PT Discharge Precautions/Restrictions Precautions Precautions: Sternal Vital Signs  O2 sats with gait on RA 90-98%, DOE increased from 0/4 to 3/4 with longer distance gait.   Pain Pain Assessment Pain Assessment: No/denies pain Mobility Transfers Sit to Stand: 7: Independent Stand to Sit: 7: Independent Locomotion  See 6 min walk test from previous session.   Ambulation Ambulation/Gait Assistance: 7: Independent Ambulation Distance (Feet): 2000 Feet Assistive device: None Ambulation/Gait Assistance Details: Pt walked down to ICU to say goodbye to nurses who took care of him down there, working on walking endurance and he did not have to take a rest break to get there.  We then walked outside and took a seated rest break due to increased DOE 3/4 by the time we made it outside.  O2 sats on RA 90-98% despite DOE.  Walked outside on various surfaces >250' grass, mulch, slanted and level brick  and concrete.  Pt demonstrated safety when crossing the street and no significant LOB.  Once outside DOE was more consistant and pt had to take more rest breaks afer longer distance gait.  Path finding task on the way back to get back to his room without assist.    Balance  see berg score from earlier today (56/56) Extremity Assessment   Bil leg strength WNL for tasks assessed, but decreased muscular endurance.          See FIM for current functional status  Lurena Joiner B. Janeya Deyo, PT, DPT [phone]   11/26/2011, 3:55 PM

## 2011-11-26 NOTE — Progress Notes (Signed)
Patient ID: Gabriel Tran, male   DOB: May 23, 1955, 56 y.o.   MRN: [id number] Subjective/Complaints:  slept well. Up in room already this am CBG (last 3)   Basename 11/25/11 2035 11/25/11 1634 11/25/11 1150  GLUCAP 132* 116* 94     Lab Results  Component Value Date   INR 2.39* 11/25/2011   INR 2.43* 11/24/2011   INR 2.39* 11/23/2011   Objective: Vital Signs: Blood pressure 163/86, pulse 55, temperature 98.5 F (36.9 C), temperature source Oral, resp. rate 22, height 5\' 8"  (1.727 m), weight 97.5 kg (214 lb 15.2 oz), SpO2 99.00%. Dg Chest 2 View  11/25/2011  *RADIOLOGY REPORT*  Clinical Data: Aortic repair  CHEST - 2 VIEW  Comparison: 11/23/2011; 11/21/2011  Findings: Grossly unchanged enlarged cardiac silhouette and mediastinal contours post median sternotomy.  Stable position of support apparatus.  Lung volumes remain reduced.  Grossly unchanged small bilateral pleural effusions both of which layer along the bilateral chest wall.  Unchanged bibasilar opacities.  The bony venous congestion without frank evidence of edema.  Unchanged bones. Right axillary surgical clips.  IMPRESSION: Unchanged small bilateral, possibly partially loculated, pleural effusions and bibasilar opacities, atelectasis versus scar.   Original Report Authenticated By: Waynard Reeds, M.D.     Basename 11/24/11 1049  WBC 7.7  HGB 8.0*  HCT 25.1*  PLT 241    Basename 11/24/11 1049  NA 130*  K 4.3  CL 93*  CO2 28  GLUCOSE 120*  BUN 39*  CREATININE 6.75*  CALCIUM 9.1   CBG (last 3)   Basename 11/25/11 2035 11/25/11 1634 11/25/11 1150  GLUCAP 132* 116* 94    Wt Readings from Last 3 Encounters:  11/26/11 97.5 kg (214 lb 15.2 oz)  11/16/11 103.7 kg (228 lb 9.9 oz)  11/16/11 103.7 kg (228 lb 9.9 oz)    Physical Exam:  Constitutional: He is oriented to person, place, and time. He appears well-developed.  HENT: mucosa pink and moist  Head: Normocephalic.  Eyes:  Pupils round and reactive to  light  Neck: Neck supple. No thyromegaly present.  Cardiovascular: Normal rate and regular rhythm. Legs with 2+ edema. Trace UE edema Pulmonary/Chest: increased breath sounds at the right base.Marland Kitchen He has no wheezes. Appears more comfortable today. Abdominal: Soft. Bowel sounds are normal. He exhibits no distension.  Neurological: He is alert and oriented to person, place, and time. resasonable memory, insight, and awareness.  Follows three-step commands. Strength grossly 4/5 in the upper ext and 3-4 in the lower ext's  Skin:   .wounds clean and intact   Psychiatric: He has a normal mood and affect.  Reduced sensation right second third digits seems improved.    Assessment/Plan: 1. Functional deficits secondary to deconditioning after aortic aneurysm repair/mediastinal exploration, multiple medical issues which require 3+ hours per day of interdisciplinary therapy in a comprehensive inpatient rehab setting. Physiatrist is providing close team supervision and 24 hour management of active medical problems listed below. Physiatrist and rehab team continue to assess barriers to discharge/monitor patient progress toward functional and medical goals. FIM: FIM - Bathing Bathing Steps Patient Completed: Chest;Right Arm;Left Arm;Abdomen;Front perineal area;Right upper leg;Left upper leg;Buttocks;Right lower leg (including foot);Left lower leg (including foot) Bathing: 5: Set-up assist to: Obtain items  FIM - Upper Body Dressing/Undressing Upper body dressing/undressing steps patient completed: Thread/unthread right sleeve of pullover shirt/dresss;Thread/unthread left sleeve of pullover shirt/dress;Put head through opening of pull over shirt/dress;Pull shirt over trunk Upper body dressing/undressing: 5: Set-up assist to: Obtain clothing/put  away FIM - Lower Body Dressing/Undressing Lower body dressing/undressing steps patient completed: Thread/unthread right underwear leg;Thread/unthread left underwear  leg;Pull underwear up/down;Thread/unthread right pants leg;Thread/unthread left pants leg;Pull pants up/down;Don/Doff right shoe;Don/Doff left shoe;Don/Doff right sock;Don/Doff left sock Lower body dressing/undressing: 5: Set-up assist to: Obtain clothing  FIM - Toileting Toileting steps completed by patient: Adjust clothing prior to toileting;Performs perineal hygiene;Adjust clothing after toileting Toileting Assistive Devices: Grab bar or rail for support Toileting: 5: Supervision: Safety issues/verbal cues  FIM - Diplomatic Services operational officer Devices: Grab bars Toilet Transfers: 5-To toilet/BSC: Supervision (verbal cues/safety issues);5-From toilet/BSC: Supervision (verbal cues/safety issues)  FIM - Bed/Chair Transfer Bed/Chair Transfer: 5: Bed > Chair or W/C: Supervision (verbal cues/safety issues);5: Chair or W/C > Bed: Supervision (verbal cues/safety issues)  FIM - Locomotion: Wheelchair Distance: Total A secondary to sternal precautions Locomotion: Wheelchair: 0: Activity did not occur FIM - Locomotion: Ambulation Locomotion: Ambulation Assistive Devices: Designer, industrial/product Ambulation/Gait Assistance: 5: Supervision Locomotion: Ambulation: 5: Travels 150 ft or more with supervision/safety issues  Comprehension Comprehension Mode: Auditory Comprehension: 6-Follows complex conversation/direction: With extra time/assistive device  Expression Expression Mode: Verbal Expression: 6-Expresses complex ideas: With extra time/assistive device  Social Interaction Social Interaction: 6-Interacts appropriately with others with medication or extra time (anti-anxiety, antidepressant).  Problem Solving Problem Solving: 5-Solves complex 90% of the time/cues < 10% of the time  Memory Memory: 5-Recognizes or recalls 90% of the time/requires cueing < 10% of the time  Medical Problem List and Plan:  1. Deconditioning following emergency aortic aneurysm repair and mediastinal  exploration  2. 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 Monitor for any bleeding episodes, coumadin adjustment per pharmacy with avelox initiation. 3. Pain Management: Oxycodone as needed. Monitor with increased mobility  4. Neuropsych: This patient is capable of making decisions on his/her own behalf.   -occasional safety judgement issues 5.Renal failure secondary to contrast nephropathy and ATN. Hemodialysis initiated.  Marland Kitchen   -TEDS, elevation for edema control  -HD per nephrology, outpt set up 6. Hypertension. Cardizem. 90 mg every 12 hours, amiodarone 200 mg twice a day. Lopressor initiated .     7. Postoperative anemia. Latest hemoglobin 8.0 Transfuse as needed. Patient denies any chest pain or increased shortness of breath. ?epo candidate 8. Shortness of Breath:     -follow up CXR with improvement of effusions  -surgery following  -O2 via [place]  -improved clinically today  -cpap has been helpful for breathing at night/sleep 9. Constipation:   -metamucil, docusate  LOS (Days) 10 A FACE TO FACE EVALUATION WAS PERFORMED  SWARTZ,ZACHARY T 11/26/2011, 7:13 AM

## 2011-11-26 NOTE — Progress Notes (Signed)
Occupational Therapy Discharge Summary  Patient Details  Name: Gabriel Tran MRN: [id number] Date of Birth: Jun 15, 1955  Today's Date: 11/26/2011  Patient has met 8 of 8 long term goals due to improved activity tolerance, improved balance, improved attention, improved awareness, improved coordination and cardio-respiratory endurance on O2.  Pt made steady progress with bathing, dressing, and toileting tasks.  Pt performs all tasks at mod I/I level and completes home mgmt tasks at ambulatory level without AD.  Pt continues to requires 3/4 dyspnea and requires regular rest breaks throughout sessions.  Pt exhibits appropriate energy conservations techniques and does not exhibit any unsafe behaviors. Patient to discharge at overall Modified Independent level.  Patient's care partner is independent to provide the necessary physical and cognitive assistance at discharge.     Recommendation:  No f/u services needed at this time.   Equipment: No equipment provided Pt stated that he and wife would assess equipment at home and will procure tub seat after discharge.  Reasons for discharge: treatment goals met and discharge from hospital  Patient/family agrees with progress made and goals achieved: Yes  OT Discharge ADL ADL Eating: Independent Where Assessed-Eating: Chair Grooming: Independent Where Assessed-Grooming: Standing at sink Upper Body Bathing: Modified independent Where Assessed-Upper Body Bathing: Sitting at sink Lower Body Bathing: Modified independent Where Assessed-Lower Body Bathing: Standing at sink;Sitting at sink Upper Body Dressing: Independent Where Assessed-Upper Body Dressing: Chair Lower Body Dressing: Modified independent Where Assessed-Lower Body Dressing: Chair Toileting: Modified independent Where Assessed-Toileting: Teacher, adult education: Engineer, agricultural Method: Insurance claims handler: Modified independent Web designer  Method: Web designer with back Vision/Perception  Vision - History Baseline Vision: Wears glasses all the time Patient Visual Report: No change from baseline Vision - Assessment Vision Assessment: Vision not tested Perception Perception: Within Functional Limits Praxis Praxis: Intact  Cognition Arousal/Alertness: Awake/alert Orientation Level: Oriented X4 Sensation Sensation Light Touch: Appears Intact Stereognosis: Appears Intact Hot/Cold: Appears Intact Proprioception: Appears Intact Motor    Mobility    see FIM Trunk/Postural Assessment  Cervical Assessment Cervical Assessment: Within Functional Limits Thoracic Assessment Thoracic Assessment: Within Functional Limits Lumbar Assessment Lumbar Assessment: Within Functional Limits Postural Control Postural Control: Within Functional Limits  Balance  Mod I Extremity/Trunk Assessment RUE Assessment RUE Assessment: Exceptions to [facility] (sternal precautions with limited ROM) LUE Assessment LUE Assessment: Exceptions to [facility] (sternal precautions with limited ROM)  See FIM for current functional status  Rich Brave 11/26/2011, 9:58 AM

## 2011-11-26 NOTE — Progress Notes (Signed)
Speech Language Pathology Discharge Summary  Patient Details  Name: Gabriel Tran MRN: [id number] Date of Birth: 1955-03-21  Today's Date: 11/26/2011  Patient has met 4 of 4 long term goals.  Patient to discharge at overall Modified Independent level.   Reasons goals not met: N/A   Clinical Impression/Discharge Summary: Pt has met 4 out of 4 LTG's this admission. Currently, pt is overall Mod I for complex problem solving, working memory, anticipatory awareness, and attention. Pt/family education complete and both report pt is at cognitive baseline.  No f/u skilled SLP intervention is warranted at this time. Pt will discharge home with 24 hour supervision.   Care Partner:  Caregiver Able to Provide Assistance: Yes  Type of Caregiver Assistance: Cognitive  Recommendation:  None;24 hour supervision/assistance      Equipment: N/A   Reasons for discharge: Treatment goals met;Discharged from hospital   Patient/Family Agrees with Progress Made and Goals Achieved: Yes   See FIM for current functional status  [name] 11/26/2011, 4:01 PM

## 2011-11-27 ENCOUNTER — Inpatient Hospital Stay (HOSPITAL_COMMUNITY): Payer: PRIVATE HEALTH INSURANCE

## 2011-11-27 LAB — PROTIME-INR
INR: 3.99 — ABNORMAL HIGH (ref 0.00–1.49)
Prothrombin Time: 36.5 seconds — ABNORMAL HIGH (ref 11.6–15.2)

## 2011-11-27 MED ORDER — WARFARIN SODIUM 2 MG PO TABS: 2.0000 mg | ORAL_TABLET | Freq: Every day | ORAL | Status: DC

## 2011-11-27 MED ORDER — WARFARIN SODIUM 2 MG PO TABS
2.0000 mg | ORAL_TABLET | Freq: Every day | ORAL | Status: DC
  Filled 2011-11-27: qty 1

## 2011-11-27 MED ORDER — OXYCODONE HCL 5 MG PO TABS
ORAL_TABLET | ORAL | Status: AC
  Administered 2011-11-27: 5 mg via ORAL
  Filled 2011-11-27: qty 1

## 2011-11-27 MED ORDER — RENA-VITE PO TABS: 1.0000 | ORAL_TABLET | Freq: Every day | ORAL | Status: DC

## 2011-11-27 MED ORDER — CALCIUM ACETATE 667 MG PO CAPS: 1334.0000 mg | ORAL_CAPSULE | Freq: Three times a day (TID) | ORAL | Status: DC

## 2011-11-27 MED ORDER — METOPROLOL TARTRATE 25 MG PO TABS: 25.0000 mg | ORAL_TABLET | Freq: Two times a day (BID) | ORAL | Status: DC

## 2011-11-27 MED ORDER — DILTIAZEM HCL ER 90 MG PO CP12: 90.0000 mg | ORAL_CAPSULE | Freq: Two times a day (BID) | ORAL | Status: DC

## 2011-11-27 MED ORDER — ALPRAZOLAM 0.5 MG PO TABS: 0.5000 mg | ORAL_TABLET | Freq: Four times a day (QID) | ORAL | Status: DC | PRN

## 2011-11-27 MED ORDER — OXYCODONE HCL 5 MG PO TABS: 5.0000 mg | ORAL_TABLET | ORAL | Status: DC | PRN

## 2011-11-27 MED ORDER — AMIODARONE HCL 200 MG PO TABS: 200.0000 mg | ORAL_TABLET | Freq: Every day | ORAL | Status: DC

## 2011-11-27 MED ORDER — AMIODARONE HCL 200 MG PO TABS
200.0000 mg | ORAL_TABLET | Freq: Every day | ORAL | Status: DC
  Filled 2011-11-27: qty 1

## 2011-11-27 MED ORDER — CLONIDINE HCL 0.2 MG PO TABS
0.2000 mg | ORAL_TABLET | Freq: Two times a day (BID) | ORAL | Status: DC
  Filled 2011-11-27 (×2): qty 1

## 2011-11-27 MED ORDER — CLONIDINE HCL 0.1 MG PO TABS: 0.1000 mg | ORAL_TABLET | Freq: Two times a day (BID) | ORAL | Status: DC

## 2011-11-27 MED ORDER — DARBEPOETIN ALFA-POLYSORBATE 100 MCG/0.5ML IJ SOLN
INTRAMUSCULAR | Status: AC
  Administered 2011-11-27: 100 ug via INTRAVENOUS
  Filled 2011-11-27: qty 0.5

## 2011-11-27 MED ORDER — PANTOPRAZOLE SODIUM 40 MG PO TBEC: 40.0000 mg | DELAYED_RELEASE_TABLET | Freq: Every day | ORAL | Status: DC

## 2011-11-27 MED ORDER — AMIODARONE HCL 200 MG PO TABS: 200.0000 mg | ORAL_TABLET | Freq: Two times a day (BID) | ORAL | Status: DC

## 2011-11-27 MED ORDER — DSS 100 MG PO CAPS: 200.0000 mg | ORAL_CAPSULE | Freq: Two times a day (BID) | ORAL | Status: DC

## 2011-11-27 MED ORDER — CLONIDINE HCL 0.2 MG PO TABS: 0.2000 mg | ORAL_TABLET | Freq: Two times a day (BID) | ORAL | Status: DC

## 2011-11-27 NOTE — Progress Notes (Signed)
Patient returned from hemodialysis at 1111. Alert and oriented x4 . Vital signs stable . Right chest catheter intact . No complaint of pain . Bilateral legs edematous . Awaiting for discharge. Patient refused to eat at this time . Continue with plan of care.                         Cleotilde Neer

## 2011-11-27 NOTE — Consult Note (Signed)
Called by Dr. Kathlee Nations Trigt regarding this patient whose case was shared with me by Dr. Donata Clay. I have reviewed his laboratory values and dose-response relative to warfarin therapy. Dr. Donata Clay has asked me to be responsible for the patient's warfarin management immediately after discharge. Dr. Donata Clay indicates he has requested/ordered a home-health agency consult for home-collection of fingerstick INRs, the results of which should be PAGED TO ME at [phone]. I have been asked by Dr. Donata Clay to keep INR as close as possible to a range of 2.5 - 2.7 owing to his recent surgical intervention and subsequent mild-moderate pericardial effusion which place patient at higher risk for hemorrhagic complications. This has to be weighed against the current diagnosis of PE for which the patient has been treated with UFH and subsequently bridged to warfarin. INRs had been in target range--all save but today's dose. Dr. Donata Clay indicates he has ordered today's dose of warfarin to be HELD--and recommenced tomorrow at 2mg  warfarin. INR may be collected this Friday 25-OCT-13 and phoned to me and I will call back to the [facility] RN and give any dose modification necessary.

## 2011-11-27 NOTE — Progress Notes (Signed)
Noted on day of discharge INR had bumped up slightly to 2.99 with Coumadin pharmacy following patient. Plan will be to discharge home on 2 mg of Coumadin beginning tomorrow 11/28/2011 and followup with Dr. Crawford Givens with next INR on 11/29/2011

## 2011-11-27 NOTE — Consult Note (Signed)
Have read the note by Mcarthur Rossetti. Angiulli, PA for inpatient rehabilitation service. He has documented a plan of action for the patient's discharge management of warfarin that will see the patient's PCP Dr. Crawford Givens of [facility] at [phone] managing the warfarin therapy. I called Dr. Para March to apprise him of the consult that I had received from Dr. Donata Clay this morning. Dr. Para March indicates that he will (as described in discharge summary note from Dan Angiulli PA-C)manage the warfarin in the setting of PE. I did share with Dr. Para March the dosing history of warfarin--and the INR bump that has occurred overnight--and Dr. Zenaida Niece Trigt's plan relative to this overnight bump that would see the warfarin being HELD/OMITTED for TODAY 22-OCT-13--and the discharge plan for warfarin being 2mg  recommencing tomorrow 23-OCT-13 with the FIRST INR (to be collected by [facility] RN by fingerstick/point of care; results called to Dr. Crawford Givens at [phone] be collected on Thursday 24-OCT-13. Dr. Para March has been apprised of the concern of Dr. Donata Clay that prompted my being consulted, i.e., the careful balance of prevention/exacerbation of peri-cardial effusion vs.the presence of PE--target goal that Dr. Donata Clay wishes to be maintained is 2.5 - 2.7 if at all possible in an effort to mitigate against exacerbation of peri-cardial effusion while still giving therapeutic benefit for PE.

## 2011-11-27 NOTE — Procedures (Signed)
[facility]  On HD viaR IJ tunneled dialysis catheter BP--168/86 Goal--4 L Hgb--8.0 on 19 Oct (on Aranesp 100/wk) and Nulecit K+--4.3 on 19 Oct  He'll be d/c'd today and go to [facility] for T-Th-Sat dialysis CBC, PT-INT, and renal profile pending He still has edema and should have EDW lowered each dialysis He says he's making more urine (230 cc out recorded yesterday)

## 2011-11-27 NOTE — Progress Notes (Signed)
Patient ID: Gabriel Tran, male   DOB: [birth year], 56 y.o.   MRN: [id number] Subjective/Complaints:  excited to go home. In HD already this am.  CBG (last 3)   Basename 11/26/11 2046 11/26/11 1643 11/26/11 1130  GLUCAP 105* 101* 107*     Lab Results  Component Value Date   INR 2.56* 11/26/2011   INR 2.39* 11/25/2011   INR 2.43* 11/24/2011   Objective: Vital Signs: Blood pressure 172/72, pulse 62, temperature 98.9 F (37.2 C), temperature source Oral, resp. rate 18, height 5\' 8"  (1.727 m), weight 100.4 kg (221 lb 5.5 oz), SpO2 98.00%. No results found.  Basename 11/24/11 1049  WBC 7.7  HGB 8.0*  HCT 25.1*  PLT 241    Basename 11/24/11 1049  NA 130*  K 4.3  CL 93*  CO2 28  GLUCOSE 120*  BUN 39*  CREATININE 6.75*  CALCIUM 9.1   CBG (last 3)   Basename 11/26/11 2046 11/26/11 1643 11/26/11 1130  GLUCAP 105* 101* 107*    Wt Readings from Last 3 Encounters:  11/27/11 100.4 kg (221 lb 5.5 oz)  11/16/11 103.7 kg (228 lb 9.9 oz)  11/16/11 103.7 kg (228 lb 9.9 oz)    Physical Exam:  Constitutional: He is oriented to person, place, and time. He appears well-developed.  HENT: mucosa pink and moist  Head: Normocephalic.  Eyes:  Pupils round and reactive to light  Neck: Neck supple. No thyromegaly present.  Cardiovascular: Normal rate and regular rhythm. Legs with 2+ edema. Trace UE edema Pulmonary/Chest: increased breath sounds at the right base.Marland Kitchen He has no wheezes. Appears more comfortable today. Abdominal: Soft. Bowel sounds are normal. He exhibits no distension.  Neurological: He is alert and oriented to person, place, and time. resasonable memory, insight, and awareness.  Follows three-step commands. Strength grossly 4/5 in the upper ext and 3-4 in the lower ext's  Skin:   .wounds clean and intact   Psychiatric: He has a normal mood and affect.  Reduced sensation right second third digits seems improved.    Assessment/Plan: 1. Functional deficits  secondary to deconditioning after aortic aneurysm repair/mediastinal exploration, multiple medical issues which require 3+ hours per day of interdisciplinary therapy in a comprehensive inpatient rehab setting. Physiatrist is providing close team supervision and 24 hour management of active medical problems listed below. Physiatrist and rehab team continue to assess barriers to discharge/monitor patient progress toward functional and medical goals.  DC goals met. Follow up with surgery, renal, pcp.  FIM: FIM - Bathing Bathing Steps Patient Completed: Chest;Right Arm;Left Arm;Abdomen;Front perineal area;Buttocks;Right upper leg;Left upper leg;Left lower leg (including foot);Right lower leg (including foot) Bathing: 6: More than reasonable amount of time  FIM - Upper Body Dressing/Undressing Upper body dressing/undressing steps patient completed: Thread/unthread right sleeve of pullover shirt/dresss;Thread/unthread left sleeve of pullover shirt/dress;Put head through opening of pull over shirt/dress;Pull shirt over trunk Upper body dressing/undressing: 7: Complete Independence: No helper FIM - Lower Body Dressing/Undressing Lower body dressing/undressing steps patient completed: Thread/unthread right underwear leg;Thread/unthread left underwear leg;Pull underwear up/down;Pull pants up/down;Thread/unthread left pants leg;Thread/unthread right pants leg;Fasten/unfasten pants;Don/Doff right shoe;Don/Doff left shoe Lower body dressing/undressing: 6: More than reasonable amount of time  FIM - Toileting Toileting steps completed by patient: Adjust clothing prior to toileting;Performs perineal hygiene;Adjust clothing after toileting Toileting Assistive Devices: Grab bar or rail for support Toileting: 6: More than reasonable amount of time  FIM - Diplomatic Services operational officer Devices: Grab bars;Walker Toilet Transfers: 5-To toilet/BSC: Supervision (verbal cues/safety issues)  FIM -  Bed/Chair Transfer Bed/Chair Transfer: 5: Supine > Sit: Supervision (verbal cues/safety issues);5: Sit > Supine: Supervision (verbal cues/safety issues);5: Bed > Chair or W/C: Supervision (verbal cues/safety issues);5: Chair or W/C > Bed: Supervision (verbal cues/safety issues)  FIM - Locomotion: Wheelchair Distance: Total A secondary to sternal precautions Locomotion: Wheelchair: 0: Activity did not occur FIM - Locomotion: Ambulation Locomotion: Ambulation Assistive Devices: Designer, industrial/product Ambulation/Gait Assistance: 7: Independent Locomotion: Ambulation: 7: Travels 150 ft or more independently  Comprehension Comprehension Mode: Auditory Comprehension: 6-Follows complex conversation/direction: With extra time/assistive device  Expression Expression Mode: Verbal Expression: 6-Expresses complex ideas: With extra time/assistive device  Social Interaction Social Interaction: 6-Interacts appropriately with others with medication or extra time (anti-anxiety, antidepressant).  Problem Solving Problem Solving: 6-Solves complex problems: With extra time  Memory Memory: 6-More than reasonable amt of time  Medical Problem List and Plan:  1. Deconditioning following emergency aortic aneurysm repair and mediastinal exploration  2. 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 Monitor for any bleeding episodes, coumadin adjustment per pharmacy with avelox initiation. 3. Pain Management: Oxycodone as needed. Monitor with increased mobility  4. Neuropsych: This patient is capable of making decisions on his/her own behalf.   -occasional safety judgement issues 5.Renal failure secondary to contrast nephropathy and ATN. Hemodialysis initiated.  outpt HD arranged  -TEDS, elevation for edema control  -HD per nephrology, outpt set up 6. Hypertension. Cardizem. 90 mg every 12 hours, amiodarone 200 mg twice a day. Lopressor initiated .     7.  Postoperative anemia. Latest hemoglobin 8.0 Transfuse as needed.   8. Shortness of Breath:     -follow up CXR with improvement of effusions  -surgery following  -O2 via [place]  -improved clinically today  -cpap has been helpful for breathing at night/sleep  -renal working on [employer] with HD 9. Constipation:   -metamucil, docusate  LOS (Days) 11 A FACE TO FACE EVALUATION WAS PERFORMED  Zaylen Susman T 11/27/2011, 7:15 AM

## 2011-11-27 NOTE — Progress Notes (Signed)
Patient returned from renal dialysis blood pressure was noted to be mildly elevated patient was asymptomatic. Received call from Dr. Kathlee Nations Trigt and recommended clonidine be increased to 0.2 mg twice a day as well his amiodarone be decreased to 200 mg daily with cardiac rate normal sinus. All medications were reviewed with patient plan was to continue dialysis at [facility] Tuesday Thursday Saturday and followed by [facility]

## 2011-11-28 ENCOUNTER — Telehealth: Payer: Self-pay

## 2011-11-28 NOTE — Progress Notes (Addendum)
Social Work  Discharge Note  The overall goal for the admission was met for:   Discharge location: Yes - home with wife  Length of Stay: Yes - 11 days  Discharge activity level: Yes - modified independent  Home/community participation: Yes  Services provided included: MD, RD, PT, OT, SLP, RN, TR, Pharmacy, Neuropsych and SW  Financial Services: Private Insurance: [employer] Counselling psychologist)  Follow-up services arranged: Home Health: RN via [facility], DME: CPAP via [employer] and Patient/Family has no preference for HH/DME agencies  Comments (or additional information):  Patient/Family verbalized understanding of follow-up arrangements: Yes  Individual responsible for coordination of the follow-up plan: patient  Confirmed correct DME delivered:  Pt and wife instructed on picking up CPAP directly from [place] on day of discharge.  Gabriel Tran  Have also scheduled a Sleep study for pt at the [facility] on 11/8 @ 8:oo pm

## 2011-11-28 NOTE — Telephone Encounter (Signed)
Gabriel Tran called to let us know patient has been set [facility] with [facility] for home care.

## 2011-11-30 ENCOUNTER — Other Ambulatory Visit: Payer: Self-pay | Admitting: Cardiothoracic Surgery

## 2011-11-30 ENCOUNTER — Telehealth: Payer: Self-pay | Admitting: Cardiology

## 2011-11-30 DIAGNOSIS — I714 Abdominal aortic aneurysm, without rupture: Secondary | ICD-10-CM

## 2011-11-30 NOTE — Telephone Encounter (Signed)
Dr Zenaida Niece trigt's office calling to schedule new pt appt with [facility] for htn and a-fib, I told the office dr Patty Sermons not taking new pt's, nor does he see pt's for htn orr a-fib, but pt says dr Patty Sermons knows his family and requested I at least leave the request, if not who would he rccommend?

## 2011-12-02 NOTE — Telephone Encounter (Signed)
Okay to see this patient.

## 2011-12-04 ENCOUNTER — Ambulatory Visit (INDEPENDENT_AMBULATORY_CARE_PROVIDER_SITE_OTHER): Payer: Managed Care, Other (non HMO) | Admitting: Family Medicine

## 2011-12-04 ENCOUNTER — Encounter: Payer: Self-pay | Admitting: Family Medicine

## 2011-12-04 VITALS — BP 122/70 | HR 68 | Temp 98.6°F | Wt 205.0 lb

## 2011-12-04 DIAGNOSIS — N186 End stage renal disease: Secondary | ICD-10-CM

## 2011-12-04 DIAGNOSIS — I10 Essential (primary) hypertension: Secondary | ICD-10-CM

## 2011-12-04 DIAGNOSIS — I2699 Other pulmonary embolism without acute cor pulmonale: Secondary | ICD-10-CM

## 2011-12-04 DIAGNOSIS — I7100 Dissection of unspecified site of aorta: Secondary | ICD-10-CM

## 2011-12-04 LAB — POCT INR: INR: 1.7

## 2011-12-04 MED ORDER — WARFARIN SODIUM 2 MG PO TABS: 2.0000 mg | ORAL_TABLET | Freq: Every day | ORAL | Status: DC

## 2011-12-04 NOTE — Patient Instructions (Addendum)
Check your clonidine and amiodarone dose.   Clonidine 0.2 mg twice a day and amiodarone 200 mg daily.  Don't take ibuprofen, aleve, goody's, BCs, or extra aspirin.  Recheck INR in 1 weeks.  Coumadin dose listed below.

## 2011-12-04 NOTE — Assessment & Plan Note (Signed)
Improved, continue current meds.  He'll check his meds at home and call back prn.  He agrees with plan.  >25 min spent with face to face with patient, >50% counseling and/or coordinating care

## 2011-12-04 NOTE — Telephone Encounter (Signed)
Scheduled appointment next week, advised patient and Revonda Standard at Dr Sheffield Slider office

## 2011-12-04 NOTE — Assessment & Plan Note (Signed)
S/p repair and has vasc surg f/u planned.  Pt/family/I are all grateful for care of patient in [facility].  D/w pt about deep breathing and pulmonary toilet.  Doing well, esp given his recent events.

## 2011-12-04 NOTE — Progress Notes (Signed)
In brief, admitted for aortic dissection repair, had emergency sternotomy in the ICU for tamponade, also with PE and ARF requiring HD.  Went inpatient rehab, now back home.  Has f/u with mult clinics in the near future.  We are following INRs here. Due for INR today.  No bleeding, bruising.  Compliant with meds.    MWF HD at [facility] and doing well with that.  UOP increasing, still with port in place on R upper chest wall.  Clonidine has been increased and amiodarone decreased per EMR notes.    We discussed his hospital course and recent developments.    Minimal pain med use now, breathing well unless supine- this is at baseline since the hospitalization.  No edema. No fevers, no sputum.  Mobility is increasing gradually.   PMH and SH reviewed  ROS: See HPI, otherwise noncontributory.  Meds, vitals, and allergies reviewed.   nad ncat Mmm Sounds to be rrr ctab Midline sternotomy scar healing, other incision sites healing well HD port appear CDI abd soft Ext w/o edema symm radial pulses Speech fluent Gait symmetric and steady

## 2011-12-04 NOTE — Assessment & Plan Note (Signed)
With VQ scan with possible PE.  Continue coumadin for now.  Inc to 4mg  Sun/Thursday, 2mg  other day.  Recheck 1 week via home health.  Written order given to patient. INR 1.7 today.  D/w pt about diet/vit K foods.

## 2011-12-05 ENCOUNTER — Ambulatory Visit (INDEPENDENT_AMBULATORY_CARE_PROVIDER_SITE_OTHER): Payer: Self-pay | Admitting: Cardiothoracic Surgery

## 2011-12-05 ENCOUNTER — Other Ambulatory Visit: Payer: Self-pay

## 2011-12-05 ENCOUNTER — Ambulatory Visit
Admission: RE | Admit: 2011-12-05 | Discharge: 2011-12-05 | Disposition: A | Discharge status: Home / Self Care | Payer: PRIVATE HEALTH INSURANCE | Source: Ambulatory Visit | Attending: Cardiothoracic Surgery | Admitting: Cardiothoracic Surgery

## 2011-12-05 ENCOUNTER — Other Ambulatory Visit: Payer: Self-pay | Admitting: Cardiothoracic Surgery

## 2011-12-05 ENCOUNTER — Encounter: Payer: Self-pay | Admitting: Cardiothoracic Surgery

## 2011-12-05 VITALS — BP 114/67 | HR 55 | Resp 16 | Ht 67.0 in | Wt 211.0 lb

## 2011-12-05 DIAGNOSIS — I7100 Dissection of unspecified site of aorta: Secondary | ICD-10-CM

## 2011-12-05 DIAGNOSIS — Z09 Encounter for follow-up examination after completed treatment for conditions other than malignant neoplasm: Secondary | ICD-10-CM

## 2011-12-05 DIAGNOSIS — I714 Abdominal aortic aneurysm, without rupture, unspecified: Secondary | ICD-10-CM

## 2011-12-05 DIAGNOSIS — I2699 Other pulmonary embolism without acute cor pulmonale: Secondary | ICD-10-CM

## 2011-12-05 DIAGNOSIS — I359 Nonrheumatic aortic valve disorder, unspecified: Secondary | ICD-10-CM

## 2011-12-05 DIAGNOSIS — I351 Nonrheumatic aortic (valve) insufficiency: Secondary | ICD-10-CM

## 2011-12-05 LAB — RENAL FUNCTION PANEL
Albumin: 3.5 g/dL (ref 3.5–5.2)
BUN: 21 mg/dL (ref 6–23)
CO2: 32 mEq/L (ref 19–32)
Calcium: 10 mg/dL (ref 8.4–10.5)
Chloride: 94 mEq/L — ABNORMAL LOW (ref 96–112)
Creat: 3.45 mg/dL — ABNORMAL HIGH (ref 0.50–1.35)
Glucose, Bld: 119 mg/dL — ABNORMAL HIGH (ref 70–99)
Phosphorus: 2.9 mg/dL (ref 2.3–4.6)
Potassium: 4.5 mEq/L (ref 3.5–5.3)
Sodium: 133 mEq/L — ABNORMAL LOW (ref 135–145)

## 2011-12-05 MED ORDER — WARFARIN SODIUM 2 MG PO TABS: ORAL_TABLET | ORAL | Status: DC

## 2011-12-05 NOTE — Telephone Encounter (Signed)
Sent!

## 2011-12-05 NOTE — Progress Notes (Signed)
PCP is Crawford Givens, MD Referring Provider is Joaquim Nam, MD  Chief Complaint  Patient presents with  . Routine Post Op    S/P repair Aortic dissection  with cxr    HPI: First office visit after a long hospitalization for repair of type a thoracic aortic dissection with arch reconstruction and resuspension of aortic valve, residual mild AI Postoperative pulmonary embolus Postoperative cardiac Not requiring emergency ICU thoracotomy Postoperative acute renal insufficiency requiring dialysis Postoperative atrial fibrillation, resolved  Patient receiving hemodialysis Monday Wednesday Friday. Patient on Coumadin for postoperative PE management by Dr. Para March of our [facility] last INR 1.7, goal INR 2.0-2.4 no clinical bleeding Patient ambulating 3-400 feet daily still somewhat weak Surgical incisions healing well no fever Native renal function improving with urine output greater than 1 L per 24 hours since Sunday, 1.4 L yesterday   Past Medical History  Diagnosis Date  . Hypertension   . Hyperglycemia     a. A1C 5.9 in Sept 2013.  . RBBB     Past Surgical History  Procedure Date  . Knee surgery     LEFT 1981  . Carpal tunnel release     2004 RIGHT  . Wisdom tooth extraction   . Thoracic aortic aneurysm repair 10/30/2011    Procedure: THORACIC ASCENDING ANEURYSM REPAIR (AAA);  Surgeon: Peter Van Trigt, MD;  Location: [facility] OR;  Service: Open Heart Surgery;  Laterality: N/A;  Ascending aortic dissection repair, arch reconstruction, aortic valve repair  . Insertion of dialysis catheter 11/06/2011    Procedure: INSERTION OF DIALYSIS CATHETER;  Surgeon: Peter Van Trigt, MD;  Location: [facility] OR;  Service: Thoracic;  Laterality: Right;  . Mediastinal exploration 11/06/2011    Procedure: MEDIASTINAL EXPLORATION;  Surgeon: Peter Van Trigt, MD;  Location: [facility] OR;  Service: Thoracic;  Laterality: N/A;  sternal closure; removal of wound vac  . Insertion of dialysis catheter 11/13/2011   Procedure: INSERTION OF DIALYSIS CATHETER;  Surgeon: Charles E Fields, MD;  Location: [facility] OR;  Service: Vascular;  Laterality: N/A;  Ultrasound guided.    Family History  Problem Relation Age of Onset  . Parkinsonism Mother   . Leukemia Father     AML at 67  . Colon polyps Brother   . Kidney disease Brother   . Heart disease Brother     CABG at age 52.  . Prostate cancer Neg Hx   . Colon cancer Neg Hx   . Heart disease Father     Angioplasty in his 50s, CABG age 64.    Social History History  Substance Use Topics  . Smoking status: Former Smoker    Types: Cigarettes    Quit date: 02/05/1990  . Smokeless tobacco: Not on file   Comment: Smoked for 20 years.  . Alcohol Use: 0.0 oz/week     10 -12 beer a week on average    Current Outpatient Prescriptions  Medication Sig Dispense Refill  . ALPRAZolam (XANAX) 0.5 MG tablet Take 1 tablet (0.5 mg total) by mouth every 6 (six) hours as needed for anxiety.  60 tablet  0  . amiodarone (PACERONE) 200 MG tablet Take 200 mg by mouth daily.      . calcium acetate (PHOSLO) 667 MG capsule Take 2 capsules (1,334 mg total) by mouth 3 (three) times daily with meals.  180 capsule  1  . cloNIDine (CATAPRES) 0.2 MG tablet Take 1 tablet (0.2 mg total) by mouth 2 (two) times daily.  60 tablet  1  .  diltiazem (CARDIZEM SR) 90 MG 12 hr capsule Take 1 capsule (90 mg total) by mouth every 12 (twelve) hours.  60 capsule  1  . docusate sodium 100 MG CAPS Take 200 mg by mouth daily.  10 capsule    . metoprolol tartrate (LOPRESSOR) 25 MG tablet Take 1 tablet (25 mg total) by mouth 2 (two) times daily.  60 tablet  1  . multivitamin (RENA-VIT) TABS tablet Take 1 tablet by mouth daily.  30 tablet  1`  . oxyCODONE (OXY IR/ROXICODONE) 5 MG immediate release tablet Take 1-2 tablets (5-10 mg total) by mouth every 3 (three) hours as needed for pain.  90 tablet  0  . pantoprazole (PROTONIX) 40 MG tablet Take 1 tablet (40 mg total) by mouth daily at 12 noon.  30 tablet   1  . warfarin (COUMADIN) 2 MG tablet Take 2 mg by mouth daily at 6 PM. As directed      . DISCONTD: warfarin (COUMADIN) 2 MG tablet Take 1 tablet (2 mg total) by mouth daily at 6 PM. Except for 4mg  a day on Sunday and Thursday.  30 tablet  1    No Known Allergies  Review of SystemsNo fever no edema no chest pain right arm strength improving from brachial plexus stretch  BP 114/67  Pulse 55  Resp 16  Ht 5\' 7"  (1.702 m)  Wt 211 lb (95.709 kg)  BMI 33.05 kg/m2  SpO2 98% Physical Exam Alert and comfortable Neck without JVD Chest with well-healed sternal incision clear breath sounds, no diastolic murmur, patient in sinus rhythm Right upper extremity with improved grip strength Pulses intact all extremities  Diagnostic Tests: Chest x-ray without pleural effusion, slight decrease in lung volumes  Impression: Progressive improvement on discharge from the hospital after repair of acute dissection and postoperative problems including dialysis-dependent renal failure and redo sternotomy for tamponade   Plan:D RETURN WITH CHEST X-RAY IN 2 WEEKS     DC amiodarone continue antihypertensives, Coumadin per Dr. Para March, primary care check renal panel today to assess creatinine as native renal function appears to be returning

## 2011-12-05 NOTE — Telephone Encounter (Signed)
[facility] request clarification of refill for quantity and # of refills on coumadin.Please advise.

## 2011-12-11 ENCOUNTER — Telehealth: Payer: Self-pay | Admitting: *Deleted

## 2011-12-11 NOTE — Telephone Encounter (Signed)
Spoke with patient and he did not have his PT/INR checked thru home health yet, per pt he will be going to dialysis on Monday, Wednesday and Thursday out on [address] and it would make sense to have it checked there? Per pt he would hate for someone to come way out to his home when he will be at dialysis already and the nurses there can do it. I advised pt I would let Dr.Duncan know.

## 2011-12-11 NOTE — Telephone Encounter (Signed)
Spoke with patient again and per pt he doesn't know who his home health is, I offered to call them. Pt states he's at dialysis from 12-5pm and would rather the nurses come there instead of bothering him at home. I advised he needs to have his PT/INR checked tomorrow, pt states they can come tomorrow morning it doesn't matter to him, again I asked for a name or number of the home health and the pt didn't know. He said they only been out once and asked a lot of "nonsense" questions and the next time they tried to come he was on his way to see Dr.Duncan.  I can't find in the pt's chart who order home health if so I would call and let them know to when to check his PT/INR.

## 2011-12-11 NOTE — Telephone Encounter (Signed)
If he can't call the [facility] agency to come to his home for INR, then he'll need to come to [facility] tomorrow for INR at lab draw.  I will notify vascular surgery about the recent developments.

## 2011-12-11 NOTE — Telephone Encounter (Signed)
I can't give the orders for the dialysis nurses to check it.  It needs to come through home health.  I need the results done via [facility] so I can manage this.

## 2011-12-11 NOTE — Telephone Encounter (Signed)
Message copied by Sueanne Margarita on Tue Dec 11, 2011  4:32 PM ------      Message from: Joaquim Nam      Created: Tue Dec 11, 2011  4:26 PM       Call pt.  I don't have INR results yet.  Thanks.             ----- Message -----         From: Joaquim Nam, MD         Sent: 12/11/2011           To: Joaquim Nam, MD             Inc to 4mg  Sun/Thursday, 2mg  other day.  Recheck 1 week via home health.

## 2011-12-12 ENCOUNTER — Ambulatory Visit: Payer: Managed Care, Other (non HMO) | Admitting: Cardiology

## 2011-12-12 ENCOUNTER — Ambulatory Visit: Payer: Managed Care, Other (non HMO) | Admitting: Cardiothoracic Surgery

## 2011-12-12 NOTE — Telephone Encounter (Signed)
Patient advised as instructed by telephone. Patient agreed to come to [facility] Thursday for the INR.

## 2011-12-12 NOTE — Telephone Encounter (Signed)
Left message on machine to call back ASAP.

## 2011-12-12 NOTE — Telephone Encounter (Signed)
Noted  

## 2011-12-13 ENCOUNTER — Ambulatory Visit (INDEPENDENT_AMBULATORY_CARE_PROVIDER_SITE_OTHER): Payer: 59 | Admitting: Family Medicine

## 2011-12-13 DIAGNOSIS — Z7901 Long term (current) use of anticoagulants: Secondary | ICD-10-CM

## 2011-12-13 DIAGNOSIS — Z5181 Encounter for therapeutic drug level monitoring: Secondary | ICD-10-CM

## 2011-12-13 DIAGNOSIS — I2699 Other pulmonary embolism without acute cor pulmonale: Secondary | ICD-10-CM

## 2011-12-13 NOTE — Patient Instructions (Addendum)
Continue 2 mg daily, except 4 mg Thurs, sun, recheck 2 weeks

## 2011-12-14 ENCOUNTER — Ambulatory Visit (HOSPITAL_BASED_OUTPATIENT_CLINIC_OR_DEPARTMENT_OTHER): Payer: PRIVATE HEALTH INSURANCE | Attending: Physician Assistant

## 2011-12-14 VITALS — Ht 67.0 in | Wt 202.0 lb

## 2011-12-14 DIAGNOSIS — G4733 Obstructive sleep apnea (adult) (pediatric): Secondary | ICD-10-CM

## 2011-12-15 DIAGNOSIS — G4733 Obstructive sleep apnea (adult) (pediatric): Secondary | ICD-10-CM

## 2011-12-15 NOTE — Procedures (Signed)
NAME:  [name], Gabriel Tran            ACCOUNT NO.:  [id number]  MEDICAL RECORD NO.:  [id number]          PATIENT TYPE:  OUT  LOCATION:  SLEEP CENTER                 FACILITY[facility]  [facility]  PHYSICIAN:  Adisen Bennion D. Maple Hudson, MD, FCCP, FACPDATE OF BIRTH:  January 19, 1958  DATE OF STUDY:  12/14/2011                           NOCTURNAL POLYSOMNOGRAM  REFERRING PHYSICIAN:  Mariam Dollar, P.A.  REFERRING PHYSICIAN:  Mariam Dollar, PA  INDICATION FOR STUDY:  Hypersomnia with sleep apnea.  EPWORTH SLEEPINESS SCORE:  9/24.  BMI 32, weight 202 pounds, height 67 inches, neck 15 inches.  MEDICATIONS:  Home medications are charted and reviewed.  SLEEP ARCHITECTURE:  Split study protocol.  During the diagnostic phase, total sleep time 123 minutes with sleep efficiency 92.8%.  Stage I was 1.6%, stage II 98.4%, stage III and REM were absent.  Sleep latency 6 minutes, awake after sleep onset 3 minutes.  Arousal index 6.8.  Bedtime medication:  Metoprolol, Senokot.  RESPIRATORY DATA:  Split study protocol.  Apnea/hypopnea index (AHI) 38.5 per hour.  A total of 79 events was scored, all as hypopneas associated with supine sleep position.  CPAP was then titrated to 8 CWP, AHI 0 per hour.  He wore a large Wisp mask with heated humidifier.  OXYGEN DATA:  Moderate snoring before CPAP with oxygen desaturation to a nadir of 85% on room air.  With CPAP titration, snoring was prevented and mean oxygen saturation held 91% on room air.  CARDIAC DATA:  Sinus rhythm.  MOVEMENT/PARASOMNIA:  No significant movement disturbance.  Bathroom x1.  IMPRESSION/RECOMMENDATION: 1. Severe obstructive sleep apnea/hypopnea syndrome, AHI 38.5 per hour     with supine events.  Moderate snoring with oxygen desaturation to a     nadir of 85% on room air. 2. Successful CPAP titration to 8 CWP, AHI 0 per hour.  Mean oxygen     saturation held 91% on room air and snoring was prevented.  He wore     a large Wisp mask with heated  humidifier.     Carlina Derks D. Maple Hudson, MD, [facility], FACP Diplomate, American Board of Sleep Medicine    CDY/MEDQ  D:  12/15/2011 12:27:55  T:  12/15/2011 23:01:05  Job:  [id number]

## 2011-12-17 ENCOUNTER — Other Ambulatory Visit: Payer: Self-pay | Admitting: Cardiothoracic Surgery

## 2011-12-17 DIAGNOSIS — I714 Abdominal aortic aneurysm, without rupture: Secondary | ICD-10-CM

## 2011-12-19 ENCOUNTER — Ambulatory Visit
Admission: RE | Admit: 2011-12-19 | Discharge: 2011-12-19 | Disposition: A | Discharge status: Home / Self Care | Payer: PRIVATE HEALTH INSURANCE | Source: Ambulatory Visit | Attending: Cardiothoracic Surgery | Admitting: Cardiothoracic Surgery

## 2011-12-19 ENCOUNTER — Encounter: Payer: Self-pay | Admitting: Cardiothoracic Surgery

## 2011-12-19 ENCOUNTER — Ambulatory Visit (INDEPENDENT_AMBULATORY_CARE_PROVIDER_SITE_OTHER): Payer: Self-pay | Admitting: Cardiothoracic Surgery

## 2011-12-19 VITALS — BP 140/81 | HR 63 | Resp 19 | Ht 67.0 in | Wt 209.0 lb

## 2011-12-19 DIAGNOSIS — I714 Abdominal aortic aneurysm, without rupture: Secondary | ICD-10-CM

## 2011-12-19 DIAGNOSIS — Z8679 Personal history of other diseases of the circulatory system: Secondary | ICD-10-CM

## 2011-12-19 DIAGNOSIS — Z9889 Other specified postprocedural states: Secondary | ICD-10-CM

## 2011-12-19 NOTE — Progress Notes (Signed)
PCP is Crawford Givens, MD Referring Provider is Joaquim Nam, MD  Chief Complaint  Patient presents with  . 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 Postop the patient developed tamponade which required emergency redo sternotomy, pulmonary embolus she required heparin and then Coumadin and atrial fibrillation which has resolved. His postop ATN has resolved and he has not needed dialysis this past week. He still has a diatech catheter in the right IJ. The patient is ambulating, driving, without CHF or angina. 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  5 days ago the patient had a sleep study by Dr. click young and was found have severe sleep apnea and was placed on home CPAP 8 cm pressure with improved sleeping He has not had any bleeding complications from his Coumadin. He is maintained sinus rhythm. The surgical incisions are all well-healed.  Past Medical History  Diagnosis Date  . Hypertension   . Hyperglycemia     a. A1C 5.9 in Sept 2013.  Marland Kitchen RBBB     Past Surgical History  Procedure Date  . Knee surgery     LEFT 1981  . Carpal tunnel release     2004 RIGHT  . Wisdom tooth extraction   . Thoracic aortic aneurysm repair 10/30/2011    Procedure: THORACIC ASCENDING ANEURYSM REPAIR (AAA);  Surgeon: Kerin Perna, MD;  Location: [facility] OR;  Service: Open Heart Surgery;  Laterality: N/A;  Ascending aortic dissection repair, arch reconstruction, aortic valve repair  . Insertion of dialysis catheter 11/06/2011    Procedure: INSERTION OF DIALYSIS CATHETER;  Surgeon: Kerin Perna, MD;  Location: [facility] OR;  Service: Thoracic;  Laterality: Right;  . Mediastinal exploration 11/06/2011    Procedure: MEDIASTINAL EXPLORATION;  Surgeon: Kerin Perna, MD;  Location: [facility] OR;  Service: Thoracic;   Laterality: N/A;  sternal closure; removal of wound vac  . Insertion of dialysis catheter 11/13/2011    Procedure: INSERTION OF DIALYSIS CATHETER;  Surgeon: Sherren Kerns, MD;  Location: [facility] OR;  Service: Vascular;  Laterality: N/A;  Ultrasound guided.    Family History  Problem Relation Age of Onset  . Parkinsonism Mother   . Leukemia Father     AML at 47  . Colon polyps Brother   . Kidney disease Brother   . Heart disease Brother     CABG at age 7.  . Prostate cancer Neg Hx   . Colon cancer Neg Hx   . Heart disease Father     Angioplasty in his 72s, CABG age 63.    Social History History  Substance Use Topics  . Smoking status: Former Smoker    Types: Cigarettes    Quit date: 02/05/1990  . Smokeless tobacco: Not on file     Comment: Smoked for 20 years.  . Alcohol Use: 0.0 oz/week     Comment: 10-12 beer a week on average    Current Outpatient Prescriptions  Medication Sig Dispense Refill  . ALPRAZolam (XANAX) 0.5 MG tablet Take 1 tablet (0.5 mg total) by mouth every 6 (six) hours as needed for anxiety.  60 tablet  0  . cloNIDine (CATAPRES) 0.2 MG tablet Take 1 tablet (0.2 mg total) by mouth 2 (two) times daily.  60 tablet  1  . diltiazem (CARDIZEM SR)  90 MG 12 hr capsule Take 1 capsule (90 mg total) by mouth every 12 (twelve) hours.  60 capsule  1  . docusate sodium 100 MG CAPS Take 200 mg by mouth daily.  10 capsule    . metoprolol tartrate (LOPRESSOR) 25 MG tablet Take 1 tablet (25 mg total) by mouth 2 (two) times daily.  60 tablet  1  . multivitamin (RENA-VIT) TABS tablet Take 1 tablet by mouth daily.  30 tablet  1`  . oxyCODONE (OXY IR/ROXICODONE) 5 MG immediate release tablet Take 1-2 tablets (5-10 mg total) by mouth every 3 (three) hours as needed for pain.  90 tablet  0  . pantoprazole (PROTONIX) 40 MG tablet Take 1 tablet (40 mg total) by mouth daily at 12 noon.  30 tablet  1  . warfarin (COUMADIN) 2 MG tablet Take 4 mg on Sunday and Thursday, 2mg  on other days.   As directed by clinic.  50 tablet  5    No Known Allergies  Review of Systems no fever improved strength and appetite  BP 140/81  Pulse 63  Resp 19  Ht 5\' 7"  (1.702 m)  Wt 209 lb (94.802 kg)  BMI 32.73 kg/m2  SpO2 94% Physical Exam Alert and comfortable Lungs clear Cardiac rhythm regular No murmur of AI No pedal edema Right hand grip strength improved  Diagnostic Tests: Chest x-ray shows resolution of pleural effusions improved lung volumes sternal wires intact  Impression: Steady improvement after repair of acutea ascending dissection with postop organ dysfunction now resolved.  Plan: Hopefully the temporary hemodialysis catheter can be removed soon by [facility]. He will be able to  start cardiac rehabilitation at [facility] after the catheter is removed. His blood pressure is well-controlled and his last INR was 2.0 overall he looks very well. Return for followup exam with chest x-ray in 4 weeks. Patient encouraged to take a 20 minute walk daily.

## 2011-12-21 ENCOUNTER — Other Ambulatory Visit: Payer: Self-pay | Admitting: *Deleted

## 2011-12-21 DIAGNOSIS — K5900 Constipation, unspecified: Secondary | ICD-10-CM

## 2011-12-21 MED ORDER — LACTULOSE 10 GM/15ML PO SOLN: 20.0000 g | Freq: Three times a day (TID) | ORAL | Status: DC

## 2011-12-24 ENCOUNTER — Other Ambulatory Visit: Payer: Self-pay | Admitting: *Deleted

## 2011-12-24 DIAGNOSIS — Z01818 Encounter for other preprocedural examination: Secondary | ICD-10-CM

## 2011-12-26 ENCOUNTER — Other Ambulatory Visit: Payer: Self-pay | Admitting: *Deleted

## 2011-12-27 ENCOUNTER — Ambulatory Visit (HOSPITAL_COMMUNITY)
Admission: RE | Admit: 2011-12-27 | Discharge: 2011-12-27 | Disposition: A | Discharge status: Home / Self Care | Payer: PRIVATE HEALTH INSURANCE | Source: Ambulatory Visit | Attending: Surgery | Admitting: Surgery

## 2011-12-27 ENCOUNTER — Ambulatory Visit: Payer: 59

## 2011-12-27 DIAGNOSIS — N186 End stage renal disease: Secondary | ICD-10-CM | POA: Insufficient documentation

## 2011-12-27 DIAGNOSIS — Z452 Encounter for adjustment and management of vascular access device: Secondary | ICD-10-CM | POA: Insufficient documentation

## 2011-12-27 LAB — APTT: aPTT: 37 seconds (ref 24–37)

## 2011-12-27 NOTE — Progress Notes (Signed)
[facility] Catheter Removal Procedure Note  Diagnosis: ESRD  Plan:  Remove right diatek catheter  Consent signed:  yes Time out completed:  yes Coumadin:  yes PT/INR (if applicable):  1.3 Other labs:  Procedure: 1.  Sterile prepping and draping over catheter area 2. 0 ml 2% lidocaine plain instilled at removal site. 3.  right catheter removed in its entirety with cuff in tact. 4.  Complications:  None 5. Tip of catheter sent for culture:  no   Patient tolerated procedure well:  yes Pressure held, no bleeding noted, dressing applied Instructions given to the pt regarding wound care and bleeding. Pt instructed to restart coumadin tomorrow morning.  Other:  Doreatha Massed, PA-C 12/27/2011 2:30 PM

## 2011-12-27 NOTE — Progress Notes (Signed)
[facility] SHORT STAY H&P  CC:  Catheter removal   HPI:  56 yo male who recently underwent emergent heart surgery.  While in the hospital, he had a diatek catheter placed 10/29/11.  He states that he is not on HD at this time and has not needed the catheter for about 2 weeks.  He is on chronic coumadin for PE.  His INR today is 1.34.  He is here to have his diatek removed.  Past Medical History  Diagnosis Date  . Hypertension   . Hyperglycemia     a. A1C 5.9 in Sept 2013.  Marland Kitchen RBBB     FH:  Non-Contributory  History   Social History  . Marital Status: Married    Spouse Name: N/A    Number of Children: N/A  . Years of Education: N/A   Occupational History  . Not on file.   Social History Main Topics  . Smoking status: Former Smoker    Types: Cigarettes    Quit date: 02/05/1990  . Smokeless tobacco: Not on file     Comment: Smoked for 20 years.  . Alcohol Use: 0.0 oz/week     Comment: 10-12 beer a week on average  . Drug Use: No  . Sexually Active: Not on file   Other Topics Concern  . Not on file   Social History Narrative   Works at [employer], some overseas travel, some [employer] travelMarried 2000, 2 adult step kids     No Known Allergies  Current Outpatient Prescriptions  Medication Sig Dispense Refill  . ALPRAZolam (XANAX) 0.5 MG tablet Take 1 tablet (0.5 mg total) by mouth every 6 (six) hours as needed for anxiety.  60 tablet  0  . cloNIDine (CATAPRES) 0.2 MG tablet Take 1 tablet (0.2 mg total) by mouth 2 (two) times daily.  60 tablet  1  . diltiazem (CARDIZEM SR) 90 MG 12 hr capsule Take 1 capsule (90 mg total) by mouth every 12 (twelve) hours.  60 capsule  1  . docusate sodium 100 MG CAPS Take 200 mg by mouth daily.  10 capsule    . lactulose (CHRONULAC) 10 GM/15ML solution Take 30 mLs (20 g total) by mouth 3 (three) times daily. Take 4 tablespoons by mouth once daily if needed for constipation  240 mL  0  . metoprolol tartrate (LOPRESSOR) 25  MG tablet Take 1 tablet (25 mg total) by mouth 2 (two) times daily.  60 tablet  1  . multivitamin (RENA-VIT) TABS tablet Take 1 tablet by mouth daily.  30 tablet  1`  . oxyCODONE (OXY IR/ROXICODONE) 5 MG immediate release tablet Take 1-2 tablets (5-10 mg total) by mouth every 3 (three) hours as needed for pain.  90 tablet  0  . pantoprazole (PROTONIX) 40 MG tablet Take 1 tablet (40 mg total) by mouth daily at 12 noon.  30 tablet  1  . warfarin (COUMADIN) 2 MG tablet Take 4 mg on Sunday and Thursday, 2mg  on other days.  As directed by clinic.  50 tablet  5    ROS:  See HPI  PHYSICAL EXAM  Filed Vitals:   12/27/11 1421  BP: 161/71  Pulse: 59  Temp: 98.7 F (37.1 C)  Resp: 20    Gen:  NAD HEENT:  normocephalic Neck:  Right IJ catheter Heart:  RRR Lungs:  Non labored Abdomen:  Soft NT/ND Skin:  No obvious rashes Neuro:  In tact  Lab/X-ray:  Impression: This  is a 56 y.o. male here for diatek catheter removal  Plan:  Removal of right diatek catheter  Doreatha Massed, PA-C [facility] [phone] 12/27/2011 2:25 PM

## 2011-12-31 ENCOUNTER — Ambulatory Visit (INDEPENDENT_AMBULATORY_CARE_PROVIDER_SITE_OTHER): Payer: PRIVATE HEALTH INSURANCE | Admitting: Cardiology

## 2011-12-31 ENCOUNTER — Encounter: Payer: Self-pay | Admitting: Cardiology

## 2011-12-31 VITALS — BP 140/72 | HR 85 | Resp 18 | Ht 67.0 in | Wt 201.0 lb

## 2011-12-31 DIAGNOSIS — I2699 Other pulmonary embolism without acute cor pulmonale: Secondary | ICD-10-CM

## 2011-12-31 DIAGNOSIS — I10 Essential (primary) hypertension: Secondary | ICD-10-CM

## 2011-12-31 DIAGNOSIS — N186 End stage renal disease: Secondary | ICD-10-CM

## 2011-12-31 DIAGNOSIS — I480 Paroxysmal atrial fibrillation: Secondary | ICD-10-CM

## 2011-12-31 DIAGNOSIS — I7100 Dissection of unspecified site of aorta: Secondary | ICD-10-CM

## 2011-12-31 DIAGNOSIS — I119 Hypertensive heart disease without heart failure: Secondary | ICD-10-CM

## 2011-12-31 DIAGNOSIS — I71011 Dissection of aortic arch: Secondary | ICD-10-CM

## 2011-12-31 DIAGNOSIS — I7101 Dissection of thoracic aorta: Secondary | ICD-10-CM

## 2011-12-31 DIAGNOSIS — I4891 Unspecified atrial fibrillation: Secondary | ICD-10-CM

## 2011-12-31 MED ORDER — DILTIAZEM HCL ER COATED BEADS 180 MG PO CP24: 180.0000 mg | ORAL_CAPSULE | Freq: Every day | ORAL | Status: DC

## 2011-12-31 NOTE — Patient Instructions (Signed)
CHANGE YOUR DILTIAZEM TO CD 180 MG 1 DAILY, STOP YOUR DILTIAZEM 90 MG   Your physician recommends that you schedule a follow-up appointment in: 6 week ov/ekg

## 2011-12-31 NOTE — Progress Notes (Signed)
Gabriel Tran Date of Birth:  17-Mar-1955 [facility] [address] [place], [place]  [address] [phone]         Fax   [phone]  History of Present Illness: This pleasant 56 year old gentleman is seen by me for the first time in the office.  I met him on 10/30/11 in the hospital where he had been admitted with atypical chest pain and uncontrolled hypertension.  He was found at cardiac catheterization to have a dissection of his descending aortic root.  Cardiac catheterization did not reveal any coronary artery disease.  He was taken to surgery that same day as an emergency by Dr. Donata Clay.  Postoperatively he had a PEA arrest on 11/03/11 and required emergent reexploration of the mediastinum.  He was found to have postoperative pulmonary emboli.  He had a long hospital course complicated by renal failure requiring dialysis.  [facility]11 and was discharged home on 11/27/11.  He was discharged on Coumadin and this is being monitored by his primary care physician at [facility].   Current Outpatient Prescriptions  Medication Sig Dispense Refill  . ALPRAZolam (XANAX) 0.5 MG tablet Take 1 tablet (0.5 mg total) by mouth every 6 (six) hours as needed for anxiety.  60 tablet  0  . cloNIDine (CATAPRES) 0.2 MG tablet Take 1 tablet (0.2 mg total) by mouth 2 (two) times daily.  60 tablet  1  . docusate sodium 100 MG CAPS Take 200 mg by mouth daily.  10 capsule    . lactulose (CHRONULAC) 10 GM/15ML solution Take 30 mLs (20 g total) by mouth 3 (three) times daily. Take 4 tablespoons by mouth once daily if needed for constipation  240 mL  0  . metoprolol tartrate (LOPRESSOR) 25 MG tablet Take 1 tablet (25 mg total) by mouth 2 (two) times daily.  60 tablet  1  . multivitamin (RENA-VIT) TABS tablet Take 1 tablet by mouth daily.  30 tablet  1`  . oxyCODONE (OXY IR/ROXICODONE) 5 MG immediate release tablet  Take 1-2 tablets (5-10 mg total) by mouth every 3 (three) hours as needed for pain.  90 tablet  0  . pantoprazole (PROTONIX) 40 MG tablet Take 1 tablet (40 mg total) by mouth daily at 12 noon.  30 tablet  1  . warfarin (COUMADIN) 2 MG tablet Take 4 mg on Sunday and Thursday, 2mg  on other days.  As directed by clinic.  50 tablet  5  . diltiazem (CARDIZEM CD) 180 MG 24 hr capsule Take 1 capsule (180 mg total) by mouth daily.  30 capsule  11    No Known Allergies  Patient Active Problem List  Diagnosis  . HYPERLIPIDEMIA  . ESSENTIAL HYPERTENSION, BENIGN  . PSORIASIS  . HYPERGLYCEMIA  . Routine general medical examination at a health care facility  . Back pain  . HTN (hypertension), malignant  . Chest pain  . Leukocytosis  . RBBB  . Aortic dissection  . Acute respiratory failure  . Hypoxemia  . A-fib  . Physical deconditioning  . Pulmonary embolism  . ESRD (end stage renal disease)    History  Smoking status  . Former Smoker  . Types: Cigarettes  . Quit date: 02/05/1990  Smokeless tobacco  . Not on file    Comment: Smoked for 20 years.    History  Alcohol Use  . 0.0 oz/week    Comment: 10-12 beer a week  on average    Family History  Problem Relation Age of Onset  . Parkinsonism Mother   . Leukemia Father     AML at 29  . Colon polyps Brother   . Kidney disease Brother   . Heart disease Brother     CABG at age 48.  . Prostate cancer Neg Hx   . Colon cancer Neg Hx   . Heart disease Father     Angioplasty in his 79s, CABG age 55.    Review of Systems: Constitutional: no fever chills diaphoresis or fatigue or change in weight.  Head and neck: no hearing loss, no epistaxis, no photophobia or visual disturbance. Respiratory: No cough, shortness of breath or wheezing. Cardiovascular: No chest pain peripheral edema, palpitations. Gastrointestinal: No abdominal distention, no abdominal pain, no change in bowel habits hematochezia or melena. Genitourinary: No  dysuria, no frequency, no urgency, no nocturia. Musculoskeletal:No arthralgias, no back pain, no gait disturbance or myalgias. Neurological: No dizziness, no headaches, no numbness, no seizures, no syncope, no weakness, no tremors. Hematologic: No lymphadenopathy, no easy bruising. Psychiatric: No confusion, no hallucinations, no sleep disturbance.    Physical Exam: Filed Vitals:   12/31/11 0919  BP: 140/72  Pulse: 85  Resp: 18   the general appearance reveals a well-developed well-nourished gentleman in no distress.  He states that his weight is down 30-40 pounds from preop.The head and neck exam reveals pupils equal and reactive.  Extraocular movements are full.  There is no scleral icterus.  The mouth and pharynx are normal.  The neck is supple.  The carotids reveal no bruits.  The jugular venous pressure is normal.  The  thyroid is not enlarged.  There is no lymphadenopathy.  The chest is clear to percussion and auscultation.  There are no rales or rhonchi.  Expansion of the chest is symmetrical.  The precordium is quiet.  The first heart sound is normal.  The second heart sound is physiologically split.  There is no murmur gallop rub or click.  There is no abnormal lift or heave.  The abdomen is soft and nontender.  The bowel sounds are normal.  The liver and spleen are not enlarged.  There are no abdominal masses.  There are no abdominal bruits.  Extremities reveal good pedal pulses.  There is no phlebitis or edema.  There is no cyanosis or clubbing.  Strength is normal and symmetrical in all extremities.  There is no lateralizing weakness.  There is decreased sensation in the middle fingers of the right hand postoperatively felt to be secondary to possible brachial plexus injury.  The skin is warm and dry.  There is no rash.  EKG shows sinus rhythm with first degree AV block and a right bundle branch block with secondary ST-T wave changes   Assessment / Plan: Continue same medication.  We  will change his diltiazem to 180 mg one daily. Recheck in 6 weeks for followup office visit and EKG then consider a followup echocardiogram to look at LV function and aortic root.

## 2011-12-31 NOTE — Assessment & Plan Note (Signed)
The patient has a long history of essential hypertension.  Postoperatively his blood pressure has been borderline high despite multiple medications.  The patient is not having any exertional chest pain or unusual dyspnea.  He has not had any recurrence of paroxysmal atrial fibrillation.

## 2011-12-31 NOTE — Assessment & Plan Note (Signed)
She has had no subsequent symptoms of pulmonary embolism.  He is on Coumadin and we will anticipate that he will be on Coumadin for a total of 6 months post surgery.

## 2011-12-31 NOTE — Assessment & Plan Note (Signed)
His renal function has improved and he has not required any subsequent hemodialysis.  His internal jugular dialysis catheter has been removed.

## 2012-01-09 ENCOUNTER — Telehealth: Payer: Self-pay | Admitting: Cardiology

## 2012-01-09 NOTE — Telephone Encounter (Signed)
[facility]- [facility] [phone] ext [phone]   plz return call to jazmin concerning order for cardiac rehab for pt

## 2012-01-09 NOTE — Telephone Encounter (Signed)
Spoke with rehab and gave them diagnosis in chart

## 2012-01-14 ENCOUNTER — Other Ambulatory Visit: Payer: Self-pay | Admitting: *Deleted

## 2012-01-14 DIAGNOSIS — I714 Abdominal aortic aneurysm, without rupture: Secondary | ICD-10-CM

## 2012-01-16 ENCOUNTER — Encounter: Payer: Self-pay | Admitting: Cardiothoracic Surgery

## 2012-01-16 ENCOUNTER — Ambulatory Visit
Admission: RE | Admit: 2012-01-16 | Discharge: 2012-01-16 | Disposition: A | Discharge status: Home / Self Care | Payer: PRIVATE HEALTH INSURANCE | Source: Ambulatory Visit | Attending: Cardiothoracic Surgery | Admitting: Cardiothoracic Surgery

## 2012-01-16 ENCOUNTER — Ambulatory Visit (INDEPENDENT_AMBULATORY_CARE_PROVIDER_SITE_OTHER): Payer: Self-pay | Admitting: Cardiothoracic Surgery

## 2012-01-16 VITALS — BP 156/86 | HR 62 | Resp 16 | Ht 67.0 in | Wt 200.0 lb

## 2012-01-16 DIAGNOSIS — Z09 Encounter for follow-up examination after completed treatment for conditions other than malignant neoplasm: Secondary | ICD-10-CM

## 2012-01-16 DIAGNOSIS — I7100 Dissection of unspecified site of aorta: Secondary | ICD-10-CM

## 2012-01-16 DIAGNOSIS — I714 Abdominal aortic aneurysm, without rupture: Secondary | ICD-10-CM

## 2012-01-16 NOTE — Progress Notes (Signed)
PCP is Crawford Givens, MD Referring Provider is Tat, Onalee Hua, MD  Chief Complaint  Patient presents with  . Routine Post Op    4 wk f/u with cxr    HPI: 10 weeks following repair of type a dissection and arch reconstruction Patient is now off dialysis and temporary catheter has been removed He remains in sinus rhythm incisions are well-healed He has some residual weakness in the right hand which is improving Chest x-ray today shows resolution of right pleural effusion and right basilar atelectasis Cardiac silhouette is stable on x-ray Patient is been on Coumadin-- plan for 6 months total-- for perioperative pulmonary embolus by VQ scan   Past Medical History  Diagnosis Date  . Hypertension   . Hyperglycemia     a. A1C 5.9 in Sept 2013.  Marland Kitchen RBBB     Past Surgical History  Procedure Date  . Knee surgery     LEFT 1981  . Carpal tunnel release     2004 RIGHT  . Wisdom tooth extraction   . Thoracic aortic aneurysm repair 10/30/2011    Procedure: THORACIC ASCENDING ANEURYSM REPAIR (AAA);  Surgeon: Kerin Perna, MD;  Location: [facility] OR;  Service: Open Heart Surgery;  Laterality: N/A;  Ascending aortic dissection repair, arch reconstruction, aortic valve repair  . Insertion of dialysis catheter 11/06/2011    Procedure: INSERTION OF DIALYSIS CATHETER;  Surgeon: Kerin Perna, MD;  Location: [facility] OR;  Service: Thoracic;  Laterality: Right;  . Mediastinal exploration 11/06/2011    Procedure: MEDIASTINAL EXPLORATION;  Surgeon: Kerin Perna, MD;  Location: [facility] OR;  Service: Thoracic;  Laterality: N/A;  sternal closure; removal of wound vac  . Insertion of dialysis catheter 11/13/2011    Procedure: INSERTION OF DIALYSIS CATHETER;  Surgeon: Sherren Kerns, MD;  Location: [facility] OR;  Service: Vascular;  Laterality: N/A;  Ultrasound guided.    Family History  Problem Relation Age of Onset  . Parkinsonism Mother   . Leukemia Father     AML at 61  . Colon polyps Brother   . Kidney disease  Brother   . Heart disease Brother     CABG at age 21.  . Prostate cancer Neg Hx   . Colon cancer Neg Hx   . Heart disease Father     Angioplasty in his 97s, CABG age 58.    Social History History  Substance Use Topics  . Smoking status: Former Smoker    Types: Cigarettes    Quit date: 02/05/1990  . Smokeless tobacco: Not on file     Comment: Smoked for 20 years.  . Alcohol Use: 0.0 oz/week     Comment: 10-12 beer a week on average    Current Outpatient Prescriptions  Medication Sig Dispense Refill  . ALPRAZolam (XANAX) 0.5 MG tablet Take 1 tablet (0.5 mg total) by mouth every 6 (six) hours as needed for anxiety.  60 tablet  0  . cloNIDine (CATAPRES) 0.2 MG tablet Take 1 tablet (0.2 mg total) by mouth 2 (two) times daily.  60 tablet  1  . diltiazem (CARDIZEM CD) 180 MG 24 hr capsule Take 1 capsule (180 mg total) by mouth daily.  30 capsule  11  . docusate sodium 100 MG CAPS Take 200 mg by mouth daily.  10 capsule    . lactulose (CHRONULAC) 10 GM/15ML solution Take 30 mLs (20 g total) by mouth 3 (three) times daily. Take 4 tablespoons by mouth once daily if needed for constipation  240 mL  0  . metoprolol tartrate (LOPRESSOR) 25 MG tablet Take 1 tablet (25 mg total) by mouth 2 (two) times daily.  60 tablet  1  . multivitamin (RENA-VIT) TABS tablet Take 1 tablet by mouth daily.  30 tablet  1`  . pantoprazole (PROTONIX) 40 MG tablet Take 1 tablet (40 mg total) by mouth daily at 12 noon.  30 tablet  1  . warfarin (COUMADIN) 2 MG tablet Take 4 mg on Sunday and Thursday, 2mg  on other days.  As directed by clinic.  50 tablet  5    No Known Allergies  Review of Systems no fever  BP 156/86  Pulse 62  Resp 16  Ht 5\' 7"  (1.702 m)  Wt 200 lb (90.719 kg)  BMI 31.32 kg/m2  SpO2 98% Physical Exam Alert and comfortable Neuro intact Lungs clear Heart rate regular No murmur Peripheral pulses intact  Diagnostic Tests: Chest x-ray shows resolution of right basilar  atelectasis-effusion  Impression: Doing well after emergency repair of type A dissection kidney with current activities and rehabilitation  Plan: CT scan of chest 3 months, no contrast because of postoperative renal failure

## 2012-01-21 ENCOUNTER — Other Ambulatory Visit: Payer: Self-pay | Admitting: *Deleted

## 2012-01-28 ENCOUNTER — Other Ambulatory Visit: Payer: Self-pay | Admitting: *Deleted

## 2012-01-31 ENCOUNTER — Other Ambulatory Visit: Payer: Self-pay

## 2012-02-01 ENCOUNTER — Other Ambulatory Visit: Payer: Self-pay

## 2012-02-01 ENCOUNTER — Telehealth: Payer: Self-pay | Admitting: *Deleted

## 2012-02-01 MED ORDER — METOPROLOL TARTRATE 25 MG PO TABS: 25.0000 mg | ORAL_TABLET | Freq: Two times a day (BID) | ORAL | Status: DC

## 2012-02-01 MED ORDER — CLONIDINE HCL 0.2 MG PO TABS: 0.2000 mg | ORAL_TABLET | Freq: Two times a day (BID) | ORAL | Status: DC

## 2012-02-01 NOTE — Telephone Encounter (Signed)
Needing refills on multiple medications--blood pressure meds.  I directed him to call either his PCP or Dr Patty Sermons his cardiologist for these refills

## 2012-02-01 NOTE — Telephone Encounter (Signed)
Rx refilled for 1 month and pt notified that Dr. Lianne Bushy nurse will call him next week regarding a f/u appt

## 2012-02-01 NOTE — Telephone Encounter (Signed)
Pt request refills metoprolol(lopressor) and clonidine to [facility] [place].(pt is out of med)Pt"s BP today is 163/78 P 59. Pt last saw Dr Para March 12/04/11; hx of hypertension and pulmonary embolism. Pt not sure when supposed to see Dr Para March again.Please advise.

## 2012-02-01 NOTE — Telephone Encounter (Signed)
Go ahead and refil once in his absence , then I will also forward to Dr D so he can decide when to plan f/u

## 2012-02-03 NOTE — Telephone Encounter (Signed)
Get him in the next month.  Thanks to all involved.

## 2012-02-04 ENCOUNTER — Telehealth: Payer: Self-pay | Admitting: *Deleted

## 2012-02-04 NOTE — Telephone Encounter (Signed)
I called this gentleman to tell him that he needs to schedule a follow up visit with Dr. Para March as requested from his last refill request.  I scheduled that appt and he then said he needed to get his INR checked.  He says he gets it checked here at [facility] and if that's the case, it has not been checked since November 7th.  He seemed very confused and finally ended up asking his wife when he was discharged from the hospital which was October 22nd.  He then saw Dr. Para March for a f/u on 12/04/11 and had an INR on 12/13/11 and it doesn't look like we've seen him since then.  I tried to explain to him how important it was to have this done once a month at the very least.  He became agitated with me and said he had it done "about once a month" and that he was out shopping and it wasn't a good time for him to talk to me.  I asked him to call in to get the INR scheduled and advised him that I would report this to Dr. Para March for his input.

## 2012-02-05 ENCOUNTER — Telehealth: Payer: Self-pay | Admitting: *Deleted

## 2012-02-05 NOTE — Telephone Encounter (Signed)
He has to make arrangements for his INR.  We have tried to get this done reliably in the past. The patient had not followed up with home health about prev INRs, in spite of our clinic trying to set this up.  He has to come in for INR this week.    If there is any concern for confusion on his part, then his wife needs to be notified about the situation and she needs to come to INR check this week with the patient and she'll need to come to f/u visit with me.    Thanks.

## 2012-02-05 NOTE — Telephone Encounter (Signed)
Opened in error

## 2012-02-05 NOTE — Telephone Encounter (Signed)
Spoke with patient.  He was agreeable to getting protime checked.  Appointment made for 02/07/12.

## 2012-02-05 NOTE — Telephone Encounter (Signed)
Addendum- I have personally talked to patient about the importance of compliance with coumadin and INR checks.  He previously voiced understanding of potential morbidity/mortality to due noncompliance with coumadin/INR checks.

## 2012-02-07 ENCOUNTER — Ambulatory Visit (INDEPENDENT_AMBULATORY_CARE_PROVIDER_SITE_OTHER): Payer: Self-pay | Admitting: General Practice

## 2012-02-07 DIAGNOSIS — Z7901 Long term (current) use of anticoagulants: Secondary | ICD-10-CM

## 2012-02-07 DIAGNOSIS — I2699 Other pulmonary embolism without acute cor pulmonale: Secondary | ICD-10-CM

## 2012-02-07 DIAGNOSIS — Z5181 Encounter for therapeutic drug level monitoring: Secondary | ICD-10-CM

## 2012-02-07 LAB — POCT INR: INR: 1

## 2012-02-13 ENCOUNTER — Ambulatory Visit: Payer: PRIVATE HEALTH INSURANCE | Admitting: Cardiology

## 2012-02-14 ENCOUNTER — Ambulatory Visit (INDEPENDENT_AMBULATORY_CARE_PROVIDER_SITE_OTHER): Payer: No Typology Code available for payment source | Admitting: Cardiology

## 2012-02-14 ENCOUNTER — Encounter: Payer: Self-pay | Admitting: Cardiology

## 2012-02-14 ENCOUNTER — Ambulatory Visit (INDEPENDENT_AMBULATORY_CARE_PROVIDER_SITE_OTHER): Payer: No Typology Code available for payment source | Admitting: General Practice

## 2012-02-14 ENCOUNTER — Other Ambulatory Visit: Payer: Self-pay | Admitting: Family Medicine

## 2012-02-14 ENCOUNTER — Other Ambulatory Visit: Payer: Self-pay | Admitting: *Deleted

## 2012-02-14 VITALS — BP 108/70 | HR 60 | Ht 67.0 in | Wt 209.0 lb

## 2012-02-14 DIAGNOSIS — Z5181 Encounter for therapeutic drug level monitoring: Secondary | ICD-10-CM

## 2012-02-14 DIAGNOSIS — I2699 Other pulmonary embolism without acute cor pulmonale: Secondary | ICD-10-CM

## 2012-02-14 DIAGNOSIS — I119 Hypertensive heart disease without heart failure: Secondary | ICD-10-CM

## 2012-02-14 DIAGNOSIS — Z7901 Long term (current) use of anticoagulants: Secondary | ICD-10-CM

## 2012-02-14 DIAGNOSIS — I7100 Dissection of unspecified site of aorta: Secondary | ICD-10-CM

## 2012-02-14 DIAGNOSIS — I10 Essential (primary) hypertension: Secondary | ICD-10-CM

## 2012-02-14 DIAGNOSIS — I4891 Unspecified atrial fibrillation: Secondary | ICD-10-CM

## 2012-02-14 NOTE — Progress Notes (Signed)
Gabriel Tran Date of Birth:  1955-06-20 [facility] [address] [place], [place]  [address] [phone]         Fax   [phone]  History of Present Illness: This pleasant 57 year old gentleman is seen her a scheduled followup office visit. I met him on 10/30/11 in the hospital where he had been admitted with atypical chest pain and uncontrolled hypertension. 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 Cardiac catheterization did not reveal any coronary artery disease. He was taken to surgery that same day as an emergency by Dr. Donata Clay. Postoperatively he had a PEA arrest on 11/03/11 and required emergent reexploration of the mediastinum. He was found to have postoperative pulmonary emboli. He had a long hospital course complicated by renal failure requiring dialysis. [facility] on 11/16/11 and was discharged home on 11/27/11. He was discharged on Coumadin and this is being monitored by his primary care physician at [facility].  Since discharge the patient has been doing well.  He has returned to work part-time.  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  He has a history of sleep apnea and uses a CPAP machine.   Current Outpatient Prescriptions  Medication Sig Dispense Refill  . ALPRAZolam (XANAX) 0.5 MG tablet Take 1 tablet (0.5 mg total) by mouth every 6 (six) hours as needed for anxiety.  60 tablet  0  . cloNIDine (CATAPRES) 0.2 MG tablet Take 1 tablet (0.2 mg total) by mouth 2 (two) times daily.  60 tablet  0  . diltiazem (CARDIZEM CD) 180 MG 24 hr capsule Take 1 capsule (180 mg total) by mouth daily.  30 capsule  11  . diltiazem (CARDIZEM SR) 90 MG 12 hr capsule       . docusate sodium 100 MG CAPS Take 200 mg by mouth daily.  10 capsule    . lactulose (CHRONULAC) 10 GM/15ML solution Take 30 mLs (20 g total) by mouth 3 (three) times  daily. Take 4 tablespoons by mouth once daily if needed for constipation  240 mL  0  . metoprolol tartrate (LOPRESSOR) 25 MG tablet Take 1 tablet (25 mg total) by mouth 2 (two) times daily.  60 tablet  0  . multivitamin (RENA-VIT) TABS tablet Take 1 tablet by mouth daily.  30 tablet  1`  . pantoprazole (PROTONIX) 40 MG tablet Take 1 tablet (40 mg total) by mouth daily at 12 noon.  30 tablet  1  . warfarin (COUMADIN) 2 MG tablet Take 4 mg on Sunday and Thursday, 2mg  on other days.  As directed by clinic.  50 tablet  5    No Known Allergies  Patient Active Problem List  Diagnosis  . HYPERLIPIDEMIA  . ESSENTIAL HYPERTENSION, BENIGN  . PSORIASIS  . HYPERGLYCEMIA  . Routine general medical examination at a health care facility  . Back pain  . HTN (hypertension), malignant  . Chest pain  . Leukocytosis  . RBBB  . Aortic dissection  . Acute respiratory failure  . Hypoxemia  . A-fib  . Physical deconditioning  . Pulmonary embolism  . ESRD (end stage renal disease)    History  Smoking status  . Former Smoker  . Types: Cigarettes  . Quit date: 02/05/1990  Smokeless tobacco  . Not on file    Comment: Smoked for 20 years.    History  Alcohol Use  .  0.0 oz/week    Comment: 10-12 beer a week on average    Family History  Problem Relation Age of Onset  . Parkinsonism Mother   . Leukemia Father     AML at 14  . Colon polyps Brother   . Kidney disease Brother   . Heart disease Brother     CABG at age 38.  . Prostate cancer Neg Hx   . Colon cancer Neg Hx   . Heart disease Father     Angioplasty in his 68s, CABG age 90.    Review of Systems: Constitutional: no fever chills diaphoresis or fatigue or change in weight.  Head and neck: no hearing loss, no epistaxis, no photophobia or visual disturbance. Respiratory: No cough, shortness of breath or wheezing. Cardiovascular: No chest pain peripheral edema, palpitations. Gastrointestinal: No abdominal distention, no abdominal  pain, no change in bowel habits hematochezia or melena. Genitourinary: No dysuria, no frequency, no urgency, no nocturia. Musculoskeletal:No arthralgias, no back pain, no gait disturbance or myalgias. Neurological: No dizziness, no headaches, no numbness, no seizures, no syncope, no weakness, no tremors. Hematologic: No lymphadenopathy, no easy bruising. Psychiatric: No confusion, no hallucinations, no sleep disturbance.    Physical Exam: Filed Vitals:   02/14/12 1038  BP: 108/70  Pulse: 60   the general appearance reveals a well-developed well-nourished gentleman in no distress.The head and neck exam reveals pupils equal and reactive.  Extraocular movements are full.  There is no scleral icterus.  The mouth and pharynx are normal.  The neck is supple.  The carotids reveal no bruits.  The jugular venous pressure is normal.  The  thyroid is not enlarged.  There is no lymphadenopathy.  The chest is clear to percussion and auscultation.  There are no rales or rhonchi.  Expansion of the chest is symmetrical.  The precordium is quiet.  The first heart sound is normal.  The second heart sound is physiologically split.  There is no murmur gallop rub or click.  There is no abnormal lift or heave.  The abdomen is soft and nontender.  The bowel sounds are normal.  The liver and spleen are not enlarged.  There are no abdominal masses.  There are no abdominal bruits.  Extremities reveal good pedal pulses.  There is no phlebitis or edema.  There is no cyanosis or clubbing.  Strength is normal and symmetrical in all extremities.  There is no lateralizing weakness.  There are no sensory deficits.  The skin is warm and dry.  There is no rash.  EKG shows sinus bradycardia and right bundle branch block.   Assessment / Plan: The patient is to continue same medication.  Continue regular walking exercise.  Recheck in 2 months for office visit CBC lipid panel hepatic function panel and basal metabolic panel.

## 2012-02-14 NOTE — Assessment & Plan Note (Signed)
The patient has been maintaining normal sinus rhythm.  He sometimes does experience a forceful regular heartbeat at night when he uses his CPAP machine.  He has not been aware of any tachycardia.

## 2012-02-14 NOTE — Assessment & Plan Note (Signed)
The patient has not been experiencing any chest pain. 

## 2012-02-14 NOTE — Patient Instructions (Signed)
Your physician recommends that you continue on your current medications as directed. Please refer to the Current Medication list given to you today.  Your physician recommends that you schedule a follow-up appointment in: 2 month ov/lp/bmet/hfp 

## 2012-02-14 NOTE — Assessment & Plan Note (Signed)
Blood pressure has been remaining stable on current therapy. 

## 2012-02-14 NOTE — Assessment & Plan Note (Addendum)
The patient will remain on Coumadin for 6 months following his surgery.  We will plan to stop Coumadin about 05/07/2011.  Patient is not having any pleuritic chest pain.  His exertional dyspnea is decreasing as his strength improves.  His EKG today is stable.

## 2012-02-15 NOTE — Telephone Encounter (Signed)
Electronic refill request.  Please advise. 

## 2012-02-16 NOTE — Telephone Encounter (Signed)
Please call in

## 2012-02-18 ENCOUNTER — Encounter: Payer: Self-pay | Admitting: Family Medicine

## 2012-02-18 NOTE — Telephone Encounter (Signed)
Rx phoned to pharmacy.  

## 2012-02-19 ENCOUNTER — Telehealth: Payer: Self-pay

## 2012-02-19 NOTE — Telephone Encounter (Signed)
Note sent to patient already, needs OV.

## 2012-02-19 NOTE — Telephone Encounter (Signed)
Triage Record Num: [id number] Operator: Roselyn Meier Patient Name: Ireland Chagnon Call Date & Time: 02/18/2012 5:42:17PM Patient Phone: [phone] PCP: Crawford Givens Patient Gender: Male PCP Fax : Patient DOB: October 05, 1955 Practice Name: [facility] Reason for Call: Caller: Tim/Patient; PCP: Crawford Givens Clelia Croft) [facility]); CB#: [phone]; Call regarding Cough/Congestion; Pt reports that he "picked up a bug over the weekend". Pt reports he has cough, drainage, and aches. Pt reports low grade fever. Pt reports he is coughing up brown phelgm. Triaged patient per Cough - Adult Protocol. See Provider within 4 Hours Disposition for 'New or worsening cough and known cardiac or respiratory condition'. Pt did not want to be seen tonight or even tomorrow in the office (appt offered). Pt is requesting a cough medication with codeine to manage cough symptoms. RN advised patient to follow up with the office in the am for medication request. RN advised OTC Delsym, warm fluids and warm showers for pt. Pt verbalized understanding. Protocol(s) Used: Cough - Adult Recommended Outcome per Protocol: See Provider within 4 hours Reason for Outcome: New or worsening cough AND known cardiac or respiratory condition Care Advice: ~ Call provider if symptoms worsen or new symptoms develop. 01/

## 2012-02-19 NOTE — Telephone Encounter (Signed)
Please call to schedule

## 2012-02-19 NOTE — Telephone Encounter (Signed)
LMOVM to call to schedule appt.

## 2012-02-25 ENCOUNTER — Ambulatory Visit: Payer: No Typology Code available for payment source

## 2012-02-26 ENCOUNTER — Ambulatory Visit (INDEPENDENT_AMBULATORY_CARE_PROVIDER_SITE_OTHER): Payer: No Typology Code available for payment source | Admitting: Family Medicine

## 2012-02-26 ENCOUNTER — Encounter: Payer: Self-pay | Admitting: Family Medicine

## 2012-02-26 VITALS — BP 142/80 | HR 62 | Temp 98.5°F | Wt 206.0 lb

## 2012-02-26 DIAGNOSIS — I2699 Other pulmonary embolism without acute cor pulmonale: Secondary | ICD-10-CM

## 2012-02-26 DIAGNOSIS — I7100 Dissection of unspecified site of aorta: Secondary | ICD-10-CM

## 2012-02-26 LAB — PROTIME-INR: INR: 1.4 ratio — ABNORMAL HIGH (ref 0.8–1.0)

## 2012-02-26 MED ORDER — WARFARIN SODIUM 2 MG PO TABS: ORAL_TABLET | ORAL | Status: DC

## 2012-02-26 NOTE — Patient Instructions (Addendum)
Go to the lab on the way out.  We'll contact you with your lab report.  Plan on recheck with Para March in 6 months.  We may need to get together sooner, depending on how you feel and the outcome of your 3/14 cardiology visit.  Take care.

## 2012-02-26 NOTE — Progress Notes (Signed)
He had a cold but this is resolving.  He has minimal residual cough.    Still on coumadin.  He had been running low on his INRs and the coumadin clinic is following this usually but he is due for INR today.  No bleeding, no bruising.  No missed doses.  He hasn't had chest pain or other sx of Afib.  He walked 3 miles briskly yesterday w/o trouble.    Off dialysis now, had been off 2 months.  He's very happy to be off HD.  Catheter is out.  Due for recheck in labs in 3/14 per cards.    Overall, a lot of progress since acute illness.  He still feels slightly 'foggy' with changes in concentration noted.  No focal changes.    Meds, vitals, and allergies reviewed.   ROS: See HPI.  Otherwise, noncontributory.  GEN: nad, alert and oriented NECK: supple w/o LA CV: rrr.  Midline scar well healed PULM: ctab, no inc wob ABD: soft, +bs EXT: no edema SKIN: no acute rash No bruising

## 2012-02-26 NOTE — Assessment & Plan Note (Signed)
Good functional status.  Still on coumadin.  See notes on INR.  Overall doing very well.  It is possible the 'foggy' sensation is due to BP meds but I wouldn't change his meds at this point.  D/w pt.  He agrees. Will recheck his summer as he has f/u with cards in 3/14.  The plan for coumadin duration is 6 months per vascular surgery's notes.

## 2012-02-27 ENCOUNTER — Other Ambulatory Visit: Payer: Self-pay | Admitting: Family Medicine

## 2012-02-27 MED ORDER — WARFARIN SODIUM 2 MG PO TABS: ORAL_TABLET | ORAL | Status: DC

## 2012-02-27 MED ORDER — DILTIAZEM HCL ER COATED BEADS 180 MG PO CP24: 180.0000 mg | ORAL_CAPSULE | Freq: Every day | ORAL | Status: DC

## 2012-02-27 MED ORDER — HYDROCHLOROTHIAZIDE 12.5 MG PO TABS: 12.5000 mg | ORAL_TABLET | Freq: Every day | ORAL | Status: DC

## 2012-03-10 ENCOUNTER — Telehealth: Payer: Self-pay | Admitting: *Deleted

## 2012-03-10 ENCOUNTER — Ambulatory Visit (INDEPENDENT_AMBULATORY_CARE_PROVIDER_SITE_OTHER): Payer: No Typology Code available for payment source | Admitting: General Practice

## 2012-03-10 DIAGNOSIS — Z5181 Encounter for therapeutic drug level monitoring: Secondary | ICD-10-CM

## 2012-03-10 DIAGNOSIS — Z7901 Long term (current) use of anticoagulants: Secondary | ICD-10-CM

## 2012-03-10 DIAGNOSIS — I2699 Other pulmonary embolism without acute cor pulmonale: Secondary | ICD-10-CM

## 2012-03-10 LAB — POCT INR: INR: 1.4

## 2012-03-10 NOTE — Telephone Encounter (Signed)
He needs to fill out his part and then we can do the rest.

## 2012-03-10 NOTE — Telephone Encounter (Signed)
LMOVM

## 2012-03-10 NOTE — Telephone Encounter (Signed)
Patient has not completed his portion of the form.  I will fill in our address, patient info, etc but I wanted to be sure you were okay with filling this out before I did that. - lsf

## 2012-03-10 NOTE — Telephone Encounter (Signed)
Pt dropped off supplemental insurance forms for Dr. Para March.  Please call [phone] when complete.  Thank you.

## 2012-03-17 ENCOUNTER — Other Ambulatory Visit: Payer: Self-pay

## 2012-03-17 DIAGNOSIS — D381 Neoplasm of uncertain behavior of trachea, bronchus and lung: Secondary | ICD-10-CM

## 2012-03-20 ENCOUNTER — Ambulatory Visit: Payer: No Typology Code available for payment source

## 2012-03-24 ENCOUNTER — Other Ambulatory Visit: Payer: Self-pay | Admitting: General Practice

## 2012-03-24 ENCOUNTER — Ambulatory Visit (INDEPENDENT_AMBULATORY_CARE_PROVIDER_SITE_OTHER): Payer: No Typology Code available for payment source | Admitting: General Practice

## 2012-03-24 DIAGNOSIS — Z5181 Encounter for therapeutic drug level monitoring: Secondary | ICD-10-CM

## 2012-03-24 DIAGNOSIS — I2699 Other pulmonary embolism without acute cor pulmonale: Secondary | ICD-10-CM

## 2012-03-24 DIAGNOSIS — Z7901 Long term (current) use of anticoagulants: Secondary | ICD-10-CM

## 2012-03-24 LAB — POCT INR: INR: 1.6

## 2012-03-24 MED ORDER — WARFARIN SODIUM 5 MG PO TABS: 5.0000 mg | ORAL_TABLET | Freq: Every day | ORAL | Status: DC

## 2012-03-25 DIAGNOSIS — Z0279 Encounter for issue of other medical certificate: Secondary | ICD-10-CM

## 2012-04-03 ENCOUNTER — Ambulatory Visit (INDEPENDENT_AMBULATORY_CARE_PROVIDER_SITE_OTHER): Payer: No Typology Code available for payment source | Admitting: General Practice

## 2012-04-03 DIAGNOSIS — I2699 Other pulmonary embolism without acute cor pulmonale: Secondary | ICD-10-CM

## 2012-04-03 DIAGNOSIS — Z7901 Long term (current) use of anticoagulants: Secondary | ICD-10-CM

## 2012-04-03 DIAGNOSIS — Z5181 Encounter for therapeutic drug level monitoring: Secondary | ICD-10-CM

## 2012-04-03 LAB — POCT INR: INR: 2

## 2012-04-09 ENCOUNTER — Other Ambulatory Visit: Payer: No Typology Code available for payment source

## 2012-04-09 ENCOUNTER — Ambulatory Visit: Payer: No Typology Code available for payment source | Admitting: Cardiothoracic Surgery

## 2012-04-14 ENCOUNTER — Other Ambulatory Visit: Payer: No Typology Code available for payment source

## 2012-04-14 ENCOUNTER — Ambulatory Visit: Payer: No Typology Code available for payment source | Admitting: Cardiology

## 2012-04-28 ENCOUNTER — Encounter: Payer: Self-pay | Admitting: Cardiology

## 2012-05-01 ENCOUNTER — Ambulatory Visit (INDEPENDENT_AMBULATORY_CARE_PROVIDER_SITE_OTHER): Payer: No Typology Code available for payment source | Admitting: General Practice

## 2012-05-01 DIAGNOSIS — Z7901 Long term (current) use of anticoagulants: Secondary | ICD-10-CM

## 2012-05-01 DIAGNOSIS — I2699 Other pulmonary embolism without acute cor pulmonale: Secondary | ICD-10-CM

## 2012-05-01 DIAGNOSIS — Z5181 Encounter for therapeutic drug level monitoring: Secondary | ICD-10-CM

## 2012-05-29 ENCOUNTER — Ambulatory Visit (INDEPENDENT_AMBULATORY_CARE_PROVIDER_SITE_OTHER): Payer: No Typology Code available for payment source | Admitting: General Practice

## 2012-05-29 DIAGNOSIS — Z5181 Encounter for therapeutic drug level monitoring: Secondary | ICD-10-CM

## 2012-05-29 DIAGNOSIS — Z7901 Long term (current) use of anticoagulants: Secondary | ICD-10-CM

## 2012-05-29 DIAGNOSIS — I2699 Other pulmonary embolism without acute cor pulmonale: Secondary | ICD-10-CM

## 2012-05-29 LAB — POCT INR: INR: 2.2

## 2012-06-16 ENCOUNTER — Ambulatory Visit: Payer: No Typology Code available for payment source | Admitting: Cardiology

## 2012-06-20 ENCOUNTER — Other Ambulatory Visit: Payer: Self-pay | Admitting: Family Medicine

## 2012-06-20 NOTE — Telephone Encounter (Signed)
Electronic refill request.  Please advise. 

## 2012-06-21 ENCOUNTER — Other Ambulatory Visit: Payer: Self-pay | Admitting: Family Medicine

## 2012-06-22 NOTE — Telephone Encounter (Signed)
Please call in

## 2012-06-23 NOTE — Telephone Encounter (Signed)
Medication phoned to pharmacy.  

## 2012-07-03 ENCOUNTER — Ambulatory Visit: Payer: No Typology Code available for payment source

## 2012-08-20 ENCOUNTER — Other Ambulatory Visit: Payer: Self-pay | Admitting: Family Medicine

## 2012-08-20 NOTE — Telephone Encounter (Signed)
Needs OV scheduled here for routine f/u.

## 2012-08-20 NOTE — Telephone Encounter (Signed)
Received refill request electronically from pharmacy. Last office visit 02/26/12. Is it okay to refill medication?

## 2012-08-21 NOTE — Telephone Encounter (Signed)
Patient advised. Appointment scheduled.  

## 2012-08-28 ENCOUNTER — Ambulatory Visit (INDEPENDENT_AMBULATORY_CARE_PROVIDER_SITE_OTHER): Payer: Commercial Indemnity | Admitting: Cardiology

## 2012-08-28 ENCOUNTER — Encounter: Payer: Self-pay | Admitting: Cardiology

## 2012-08-28 VITALS — BP 134/86 | HR 66 | Ht 67.0 in | Wt 218.4 lb

## 2012-08-28 DIAGNOSIS — I2699 Other pulmonary embolism without acute cor pulmonale: Secondary | ICD-10-CM

## 2012-08-28 DIAGNOSIS — I119 Hypertensive heart disease without heart failure: Secondary | ICD-10-CM

## 2012-08-28 DIAGNOSIS — N289 Disorder of kidney and ureter, unspecified: Secondary | ICD-10-CM

## 2012-08-28 DIAGNOSIS — I4891 Unspecified atrial fibrillation: Secondary | ICD-10-CM

## 2012-08-28 DIAGNOSIS — I7100 Dissection of unspecified site of aorta: Secondary | ICD-10-CM

## 2012-08-28 HISTORY — DX: Disorder of kidney and ureter, unspecified: N28.9

## 2012-08-28 NOTE — Assessment & Plan Note (Signed)
Blood pressure today is borderline elevated.  On repeat blood pressure measurement by myself his blood pressure is 140/80.  His weight is up 9 pounds since last visit.  He talked about the importance of keeping his blood pressure under control because of his past history of aortic dissection.

## 2012-08-28 NOTE — Patient Instructions (Signed)
Will obtain labs today and call you with the results (cbc/bmet/hfp)  Your physician has requested that you have an echocardiogram. Echocardiography is a painless test that uses sound waves to create images of your heart. It provides your doctor with information about the size and shape of your heart and how well your heart's chambers and valves are working. This procedure takes approximately one hour. There are no restrictions for this procedure.  Your physician wants you to follow-up in: 3 month ov/bmet You will receive a reminder letter in the mail two months in advance. If you don't receive a letter, please call our office to schedule the follow-up appointment.

## 2012-08-28 NOTE — Assessment & Plan Note (Signed)
The patient states that he is voiding well.  He does have nocturia.  There are no renal function studies in epic since 12/05/11 at which time his creatinine was 3.45. The patient would like to not have to take clonidine.  We are checking a basal metabolic panel today as well as other labs to update his profile and see if he would be a candidate for ACE inhibitor or ARB at this point.

## 2012-08-28 NOTE — Assessment & Plan Note (Signed)
Patient remains on Coumadin.  His pulmonary embolus was 10 months ago.  We are going to have him return soon for a two-dimensional echocardiogram.  His previous echocardiogram 11/19/11 postoperatively showed right ventricular dilatation and normal LV systolic function with ejection fraction 55-60%.  We will consider stopping his Coumadin after we see how his echo looks.

## 2012-08-28 NOTE — Progress Notes (Signed)
Gabriel Tran Date of Birth:  May 11, 1955 [facility] [address] [place], [place]  [address] [phone]         Fax   [phone]  History of Present Illness: This pleasant 57 year old gentleman is seen for a scheduled followup office visit. I met him on 10/30/11 in the hospital where he had been admitted with atypical chest pain and uncontrolled hypertension. 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 Cardiac catheterization did not reveal any coronary artery disease. He was taken to surgery that same day as an emergency by Dr. Donata Clay. Postoperatively he had a PEA arrest on 11/03/11 and required emergent reexploration of the mediastinum. He was found to have postoperative pulmonary emboli. He had a long hospital course complicated by renal failure requiring dialysis. [facility] was admitted on 11/16/11 and was discharged home on 11/27/11. He was discharged on Coumadin and this is being monitored by his primary care physician at [facility]. Since discharge the patient has been doing well. He has returned to work . 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 He has a history of sleep apnea and uses a CPAP machine.  Since last visit he has not been experiencing any chest pain or shortness of breath.  He has not been wearing any irregular heartbeat.   Current Outpatient Prescriptions  Medication Sig Dispense Refill  . ALPRAZolam (XANAX) 0.5 MG tablet take 1 tablet by mouth every 6 hours if needed for anxiety  30 tablet  0  . cloNIDine (CATAPRES) 0.2 MG tablet Take 1 tablet (0.2 mg total) by mouth 2 (two) times daily.  60 tablet  0  . diltiazem (CARDIZEM CD) 180 MG 24 hr capsule Take 1 capsule (180 mg total) by mouth daily.  90 capsule  1  . hydrochlorothiazide (HYDRODIURIL) 12.5 MG tablet Take 1 tablet (12.5 mg total) by mouth daily.  90 tablet  1  . lactulose  (CHRONULAC) 10 GM/15ML solution Take 30 mLs (20 g total) by mouth 3 (three) times daily. Take 4 tablespoons by mouth once daily if needed for constipation  240 mL  0  . metoprolol tartrate (LOPRESSOR) 25 MG tablet Take 1 tablet (25 mg total) by mouth 2 (two) times daily.  180 tablet  1  . warfarin (COUMADIN) 5 MG tablet take 1 tablet by mouth once daily as directed BY COUMADIN CLINIC  35 tablet  2   No current facility-administered medications for this visit.    No Known Allergies  Patient Active Problem List   Diagnosis Date Noted  . Pulmonary embolism 12/04/2011  . ESRD (end stage renal disease) 12/04/2011  . Physical deconditioning 11/19/2011  . Acute respiratory failure 11/09/2011  . Hypoxemia 11/09/2011  . A-fib 11/09/2011  . Aortic dissection 10/31/2011  . RBBB 10/30/2011  . HTN (hypertension), malignant 10/29/2011  . Chest pain 10/29/2011  . Leukocytosis 10/29/2011  . Routine general medical examination at a health care facility 09/07/2011  . Back pain 09/07/2011  . HYPERLIPIDEMIA 06/11/2007  . ESSENTIAL HYPERTENSION, BENIGN 06/11/2007  . PSORIASIS 06/11/2007  . HYPERGLYCEMIA 06/11/2007    History  Smoking status  . Former Smoker  . Types: Cigarettes  . Quit date: 02/05/1990  Smokeless tobacco  . Not on file    Comment: Smoked for 20 years.    History  Alcohol Use  . 0.0 oz/week    Comment: 10-12 beer a week on  average    Family History  Problem Relation Age of Onset  . Parkinsonism Mother   . Leukemia Father     AML at 45  . Colon polyps Brother   . Kidney disease Brother   . Heart disease Brother     CABG at age 31.  . Prostate cancer Neg Hx   . Colon cancer Neg Hx   . Heart disease Father     Angioplasty in his 34s, CABG age 58.    Review of Systems: Constitutional: no fever chills diaphoresis or fatigue or change in weight.  Head and neck: no hearing loss, no epistaxis, no photophobia or visual disturbance. Respiratory: No cough, shortness of  breath or wheezing. Cardiovascular: No chest pain peripheral edema, palpitations. Gastrointestinal: No abdominal distention, no abdominal pain, no change in bowel habits hematochezia or melena. Genitourinary: No dysuria, no frequency, no urgency, no nocturia. Musculoskeletal:No arthralgias, no back pain, no gait disturbance or myalgias. Neurological: No dizziness, no headaches, no numbness, no seizures, no syncope, no weakness, no tremors. Hematologic: No lymphadenopathy, no easy bruising. Psychiatric: No confusion, no hallucinations, no sleep disturbance.    Physical Exam: Filed Vitals:   08/28/12 1529  BP: 134/86  Pulse: 66   the general appearance reveals a well-developed well-nourished gentleman in no distress.  Her peak blood pressure by me is 140/80 bilaterally sitting.  His weight is up 9 pounds since last visit.The head and neck exam reveals pupils equal and reactive.  Extraocular movements are full.  There is no scleral icterus.  The mouth and pharynx are normal.  The neck is supple.  The carotids reveal no bruits.  The jugular venous pressure is normal.  The  thyroid is not enlarged.  There is no lymphadenopathy.  The chest is clear to percussion and auscultation.  There are no rales or rhonchi.  Expansion of the chest is symmetrical.  The precordium is quiet.  The first heart sound is normal.  The second heart sound is physiologically split.  There is no murmur gallop rub or click.  There is no aortic insufficiency murmur.  There is no abnormal lift or heave.  The abdomen is soft and nontender.  The bowel sounds are normal.  The liver and spleen are not enlarged.  There are no abdominal masses.  There are no abdominal bruits.  Extremities reveal good pedal pulses.  There is no phlebitis or edema.  There is no cyanosis or clubbing.  Strength is normal and symmetrical in all extremities.  There is no lateralizing weakness.  There are no sensory deficits.  The skin is warm and dry.  There is  no rash.  EKG today shows normal sinus rhythm with first degree AV block and right bundle branch block pattern and is unchanged since 02/14/12   Assessment / Plan: Continue same medication.  Check basal metabolic panel and if satisfactory consider starting to taper clonidine and switch to lisinopril or an ARB. Obtain two-dimensional echocardiogram looking at atrial size and right ventricular size.  Consider stopping Coumadin and switch him 2 baby aspirin after we see what the echo shows.  He has not had any atrial fibrillation or evidence of recurrent pulmonary emboli. Recheck 3 months for followup office visit and basal metabolic panel

## 2012-08-29 LAB — BASIC METABOLIC PANEL
CO2: 28 mEq/L (ref 19–32)
Calcium: 9.6 mg/dL (ref 8.4–10.5)
Creatinine, Ser: 1.5 mg/dL (ref 0.4–1.5)
GFR: 50.5 mL/min — ABNORMAL LOW (ref >60.00)
Glucose, Bld: 98 mg/dL (ref 70–99)
Sodium: 131 mEq/L — ABNORMAL LOW (ref 135–145)

## 2012-08-29 LAB — CBC WITH DIFFERENTIAL/PLATELET
Basophils Absolute: 0 10*3/uL (ref 0.0–0.1)
Eosinophils Absolute: 0.1 10*3/uL (ref 0.0–0.7)
Lymphocytes Relative: 20.6 % (ref 12.0–46.0)
MCHC: 34.3 g/dL (ref 30.0–36.0)
Monocytes Relative: 7.1 % (ref 3.0–12.0)
Neutrophils Relative %: 70.4 % (ref 43.0–77.0)
RBC: 4.13 Mil/uL — ABNORMAL LOW (ref 4.22–5.81)
RDW: 13.2 % (ref 11.5–14.6)

## 2012-09-01 ENCOUNTER — Encounter: Payer: Self-pay | Admitting: Family Medicine

## 2012-09-01 ENCOUNTER — Ambulatory Visit (INDEPENDENT_AMBULATORY_CARE_PROVIDER_SITE_OTHER): Payer: Commercial Indemnity | Admitting: Family Medicine

## 2012-09-01 VITALS — BP 112/70 | HR 74 | Temp 98.2°F | Ht 67.0 in | Wt 219.0 lb

## 2012-09-01 DIAGNOSIS — I2699 Other pulmonary embolism without acute cor pulmonale: Secondary | ICD-10-CM

## 2012-09-01 DIAGNOSIS — Z5181 Encounter for therapeutic drug level monitoring: Secondary | ICD-10-CM

## 2012-09-01 DIAGNOSIS — N186 End stage renal disease: Secondary | ICD-10-CM

## 2012-09-01 DIAGNOSIS — Z7901 Long term (current) use of anticoagulants: Secondary | ICD-10-CM

## 2012-09-01 DIAGNOSIS — I119 Hypertensive heart disease without heart failure: Secondary | ICD-10-CM

## 2012-09-01 NOTE — Patient Instructions (Addendum)
We'll check with cardiology about your BP meds.  Don't change anything unless you hear from cards.   Take care.  Schedule a physical in summer 2015.

## 2012-09-01 NOTE — Assessment & Plan Note (Signed)
Controlled, will check with cards about possible ACE.  Echo pending.  Off HD and doing well.

## 2012-09-01 NOTE — Progress Notes (Signed)
In brief, admitted for aortic dissection repair, had emergency sternotomy in the ICU for tamponade, also with PE and ARF requiring HD. Went inpatient rehab, now back home.  Now off ESRD.  May come off coumadin.  Echo f/u pending with cards.  Due for f/u echo with cards later this week.  He hasn't been in AF that he knows of.  No CP, not SOB.  He isn't on an ACE.    He feels well.  No bleeding, no bruising.  Work is going well but his father in law is dying from cancer, he is on hospice.  I offered my condolences.    Meds, vitals, and allergies reviewed.   ROS: See HPI.  Otherwise, noncontributory.  GEN: nad, alert and oriented HEENT: mucous membranes moist NECK: supple w/o LA CV: rrr.  Faint SEM PULM: ctab, no inc wob ABD: soft, +bs EXT: no edema SKIN: no acute rash

## 2012-09-03 ENCOUNTER — Other Ambulatory Visit: Payer: Self-pay | Admitting: Family Medicine

## 2012-09-03 ENCOUNTER — Ambulatory Visit (HOSPITAL_COMMUNITY): Payer: Commercial Indemnity | Attending: Cardiology

## 2012-09-03 DIAGNOSIS — Z87891 Personal history of nicotine dependence: Secondary | ICD-10-CM | POA: Insufficient documentation

## 2012-09-03 DIAGNOSIS — I4891 Unspecified atrial fibrillation: Secondary | ICD-10-CM | POA: Insufficient documentation

## 2012-09-03 DIAGNOSIS — Z09 Encounter for follow-up examination after completed treatment for conditions other than malignant neoplasm: Secondary | ICD-10-CM | POA: Insufficient documentation

## 2012-09-03 DIAGNOSIS — Z86711 Personal history of pulmonary embolism: Secondary | ICD-10-CM | POA: Insufficient documentation

## 2012-09-03 DIAGNOSIS — I10 Essential (primary) hypertension: Secondary | ICD-10-CM | POA: Insufficient documentation

## 2012-09-03 DIAGNOSIS — N289 Disorder of kidney and ureter, unspecified: Secondary | ICD-10-CM | POA: Insufficient documentation

## 2012-09-03 DIAGNOSIS — I7100 Dissection of unspecified site of aorta: Secondary | ICD-10-CM

## 2012-09-03 DIAGNOSIS — R079 Chest pain, unspecified: Secondary | ICD-10-CM | POA: Insufficient documentation

## 2012-09-03 NOTE — Progress Notes (Signed)
Echocardiogram performed.  

## 2012-09-04 ENCOUNTER — Telehealth: Payer: Self-pay | Admitting: *Deleted

## 2012-09-04 DIAGNOSIS — Z79899 Other long term (current) drug therapy: Secondary | ICD-10-CM

## 2012-09-04 MED ORDER — LOSARTAN POTASSIUM 25 MG PO TABS: 25.0000 mg | ORAL_TABLET | Freq: Every day | ORAL | Status: DC

## 2012-09-04 NOTE — Telephone Encounter (Signed)
Advised patient. Patient did want to know if he could come off Catapres. Discussed with  Dr. Patty Sermons and will have patient decrease Catapres to once a day opposite of Losartan. Also ok to stop warfarin and start a baby aspirin. Follow up with  Dr. Patty Sermons in one month. Patient verbalized understanding.

## 2012-09-04 NOTE — Telephone Encounter (Signed)
Message copied by Burnell Blanks on Thu Sep 04, 2012  5:36 PM ------      Message from: Cassell Clement      Created: Wed Sep 03, 2012  8:31 PM       Please report. LV function in low normal range EF 50-55%. Aortic valve shows mild regurgitation.       I want him to add ARB losartan 25 mg daily.  Check BMET 1 week after starting ARB. ------

## 2012-09-12 ENCOUNTER — Other Ambulatory Visit (INDEPENDENT_AMBULATORY_CARE_PROVIDER_SITE_OTHER): Payer: Commercial Indemnity

## 2012-09-12 DIAGNOSIS — Z79899 Other long term (current) drug therapy: Secondary | ICD-10-CM

## 2012-09-12 LAB — BASIC METABOLIC PANEL
GFR: 61.48 mL/min (ref >60.00)
Potassium: 3.4 mEq/L — ABNORMAL LOW (ref 3.5–5.1)
Sodium: 132 mEq/L — ABNORMAL LOW (ref 135–145)

## 2012-09-13 ENCOUNTER — Encounter: Payer: Self-pay | Admitting: Cardiothoracic Surgery

## 2012-09-14 NOTE — Progress Notes (Signed)
Quick Note:  Please report to patient. The recent labs are stable. Continue same medication and careful diet. K is low. Add Ktab 10 meq one daily. ______

## 2012-09-16 ENCOUNTER — Other Ambulatory Visit: Payer: Self-pay | Admitting: *Deleted

## 2012-09-16 DIAGNOSIS — I7100 Dissection of unspecified site of aorta: Secondary | ICD-10-CM

## 2012-09-16 DIAGNOSIS — I712 Thoracic aortic aneurysm, without rupture: Secondary | ICD-10-CM

## 2012-09-17 ENCOUNTER — Telehealth: Payer: Self-pay | Admitting: *Deleted

## 2012-09-17 DIAGNOSIS — E876 Hypokalemia: Secondary | ICD-10-CM

## 2012-09-17 MED ORDER — POTASSIUM CHLORIDE ER 10 MEQ PO TBCR: 10.0000 meq | EXTENDED_RELEASE_TABLET | Freq: Every day | ORAL | Status: DC

## 2012-09-17 NOTE — Telephone Encounter (Signed)
Message copied by Burnell Blanks on Wed Sep 17, 2012  9:37 AM ------      Message from: Cassell Clement      Created: Sun Sep 14, 2012  4:07 PM       Please report to patient.  The recent labs are stable. Continue same medication and careful diet.      K is low.  Add Ktab 10 meq one daily. ------

## 2012-09-17 NOTE — Telephone Encounter (Signed)
Advised patient of lab results and medication change  

## 2012-09-19 ENCOUNTER — Other Ambulatory Visit: Payer: Commercial Indemnity

## 2012-09-22 ENCOUNTER — Ambulatory Visit: Payer: Commercial Indemnity

## 2012-09-23 ENCOUNTER — Other Ambulatory Visit: Payer: Self-pay | Admitting: Family Medicine

## 2012-09-30 ENCOUNTER — Other Ambulatory Visit: Payer: Self-pay | Admitting: Family Medicine

## 2012-10-08 ENCOUNTER — Other Ambulatory Visit: Payer: Commercial Indemnity

## 2012-10-08 ENCOUNTER — Ambulatory Visit: Payer: Commercial Indemnity | Admitting: Cardiology

## 2012-12-11 ENCOUNTER — Other Ambulatory Visit: Payer: Self-pay

## 2012-12-31 ENCOUNTER — Other Ambulatory Visit: Payer: Self-pay | Admitting: Family Medicine

## 2013-01-27 ENCOUNTER — Ambulatory Visit: Payer: Self-pay | Admitting: Family Medicine

## 2013-01-27 DIAGNOSIS — I2699 Other pulmonary embolism without acute cor pulmonale: Secondary | ICD-10-CM

## 2013-02-17 ENCOUNTER — Other Ambulatory Visit: Payer: Self-pay | Admitting: Cardiology

## 2013-02-18 ENCOUNTER — Other Ambulatory Visit: Payer: Self-pay | Admitting: Family Medicine

## 2013-03-14 ENCOUNTER — Other Ambulatory Visit: Payer: Self-pay | Admitting: Family Medicine

## 2013-03-31 ENCOUNTER — Other Ambulatory Visit: Payer: Self-pay | Admitting: Cardiology

## 2013-05-06 ENCOUNTER — Other Ambulatory Visit: Payer: Self-pay | Admitting: Cardiology

## 2013-05-11 ENCOUNTER — Encounter: Payer: Self-pay | Admitting: Physician Assistant

## 2013-05-11 ENCOUNTER — Ambulatory Visit (INDEPENDENT_AMBULATORY_CARE_PROVIDER_SITE_OTHER): Payer: 59 | Admitting: Physician Assistant

## 2013-05-11 VITALS — BP 144/88 | HR 55 | Ht 67.0 in | Wt 212.1 lb

## 2013-05-11 DIAGNOSIS — I7100 Dissection of unspecified site of aorta: Secondary | ICD-10-CM

## 2013-05-11 DIAGNOSIS — I2699 Other pulmonary embolism without acute cor pulmonale: Secondary | ICD-10-CM

## 2013-05-11 DIAGNOSIS — N289 Disorder of kidney and ureter, unspecified: Secondary | ICD-10-CM

## 2013-05-11 DIAGNOSIS — I10 Essential (primary) hypertension: Secondary | ICD-10-CM

## 2013-05-11 MED ORDER — METOPROLOL TARTRATE 25 MG PO TABS: 25.0000 mg | ORAL_TABLET | Freq: Two times a day (BID) | ORAL | Status: DC

## 2013-05-11 MED ORDER — LOSARTAN POTASSIUM 50 MG PO TABS: 50.0000 mg | ORAL_TABLET | Freq: Every day | ORAL | Status: DC

## 2013-05-11 MED ORDER — POTASSIUM CHLORIDE ER 10 MEQ PO TBCR: EXTENDED_RELEASE_TABLET | ORAL | Status: DC

## 2013-05-11 MED ORDER — DILTIAZEM HCL ER COATED BEADS 180 MG PO CP24: 180.0000 mg | ORAL_CAPSULE | Freq: Every day | ORAL | Status: DC

## 2013-05-11 MED ORDER — HYDROCHLOROTHIAZIDE 12.5 MG PO TABS
12.5000 mg | ORAL_TABLET | Freq: Every day | ORAL | Status: DC
Start: 2013-05-11 — End: 2014-03-24

## 2013-05-11 NOTE — Progress Notes (Signed)
[address] [place], [place]  [address] Phone: [phone] Fax:  [phone]  Date:  05/11/2013   ID:  Gabriel Tran, DOB 18-Aug-1955, MRN [id number]  PCP:  Elsie Stain, MD  Cardiologist:  Dr. Darlin Coco     History of Present Illness: Gabriel Tran is a 58 y.o. male with a hx of ascending aortic dissection in the setting of uncontrolled HTN.  He underwent emergent repair and post op course was c/b PEA arrest and pulmonary emboli.  He had a long hospital course complicated by renal failure requiring dialysis.  He had a prolonged hospital course.  Last seen by Dr. Darlin Coco in 08/2012.  F/u echo with EF 50-55%.  He was place on Losartan 25 QD after that visit.    He returns with his wife today for follow up.  His wife has recently suffered an inferior STEMI and is also here for follow up.  The patient is doing well. He denies chest pain, shortness of breath, syncope. He denies orthopnea, PND or edema. He has gradually been tapering himself off clonidine. He is no longer on Coumadin.   Studies:  - LHC (10/29/11):  No CAD in LAD and CFX; RCA not engaged.  - Echo (09/03/12):  EF 50-55%, no RWMA, mild AI, mild LAE.     Recent Labs: 08/28/2012: Hemoglobin 12.6*  09/12/2012: Creatinine 1.3; Potassium 3.4*   Wt Readings from Last 3 Encounters:  05/11/13 212 lb 1.9 oz (96.217 kg)  09/01/12 219 lb (99.338 kg)  08/28/12 218 lb 6.4 oz (99.066 kg)     Past Medical History  Diagnosis Date  . Hypertension   . Hyperglycemia     a. A1C 5.9 in Sept 2013.  Marland Kitchen RBBB   . Renal insufficiency 08/28/2012    Current Outpatient Prescriptions  Medication Sig Dispense Refill  . ALPRAZolam (XANAX) 0.5 MG tablet take 1 tablet by mouth every 6 hours if needed for anxiety  30 tablet  0  . aspirin 81 MG tablet Take 81 mg by mouth daily.      . cloNIDine (CATAPRES) 0.2 MG tablet 1/2 tablet qhs      . diltiazem (CARDIZEM CD) 180 MG 24 hr capsule take 1 capsule by mouth once  daily  30 capsule  0  . hydrochlorothiazide (HYDRODIURIL) 12.5 MG tablet Take 1 tablet (12.5 mg total) by mouth daily.  90 tablet  1  . losartan (COZAAR) 25 MG tablet take 1 tablet by mouth once daily (NEED OFFICE VISIT)  30 tablet  0  . metoprolol tartrate (LOPRESSOR) 25 MG tablet take 1 tablet by mouth twice a day  180 tablet  1  . potassium chloride (K-DUR) 10 MEQ tablet take 1 tablet by mouth once daily  30 tablet  0   No current facility-administered medications for this visit.    Allergies:   Review of patient's allergies indicates no known allergies.   Social History:  The patient  reports that he quit smoking about 23 years ago. His smoking use included Cigarettes. He smoked 0.00 packs per day. He does not have any smokeless tobacco history on file. He reports that he drinks alcohol. He reports that he does not use illicit drugs.   Family History:  The patient's family history includes Colon polyps in his brother; Heart disease in his brother and father; Kidney disease in his brother; Leukemia in his father; Parkinsonism in his mother. There is no history of Prostate cancer or Colon  cancer.   ROS:  Please see the history of present illness.      All other systems reviewed and negative.   PHYSICAL EXAM: VS:  BP 144/88  Pulse 55  Ht 5\' 7"  (1.702 m)  Wt 212 lb 1.9 oz (96.217 kg)  BMI 33.21 kg/m2 Well nourished, well developed, in no acute distress HEENT: normal Neck: no JVD Cardiac:  normal S1, S2; RRR; no murmur Lungs:  clear to auscultation bilaterally, no wheezing, rhonchi or rales Abd: soft, nontender, no hepatomegaly Ext: no edema Skin: warm and dry Neuro:  CNs 2-12 intact, no focal abnormalities noted  EKG:  Sinus bradycardia, HR 55, RBBB     ASSESSMENT AND PLAN:  1. Hypertension: Fair control. He would like to get off of the clonidine. I will taper him off of this. He will increase his losartan to 50 mg daily. Check a basic metabolic panel in one week. 2. Chronic  Kidney Disease: His creatinine has improved over time. Repeat basic metabolic panel next week as planned. 3. Type A Aortic Dissection: He has not seen Dr. Prescott Gum since 2013. I have asked him to call to arrange follow up. 4. Pulmonary Embolism: He has completed treatment with Coumadin. He remains on aspirin only. 5. Disposition: Follow up with Dr. Mare Ferrari in 6 months.  Signed, Richardson Dopp, PA-C  05/11/2013 4:18 PM

## 2013-05-11 NOTE — Patient Instructions (Signed)
Your physician wants you to follow-up in: [place]. You will receive a reminder letter in the mail two months in advance. If you don't receive a letter, please call our office to schedule the follow-up appointment.   LAB WORK IN 1 WEEK (BMET)  INCREASE LOSARTAN TO 50 MG DAILY; NEW RX SENT IN FOR THE 50 MG TABLET  TAKE CLONIDINE 0.1 MG AT BEDTIME EVERY OTHER NIGHT FOR 2 MORE DOSES THEN STOP

## 2013-05-12 ENCOUNTER — Encounter: Payer: Self-pay | Admitting: Physician Assistant

## 2013-05-12 ENCOUNTER — Telehealth: Payer: Self-pay | Admitting: *Deleted

## 2013-05-12 NOTE — Telephone Encounter (Signed)
Pt requested 90 day supply of losartan/ I see that it was sent as a 90 day already, I will inform the pt.

## 2013-05-18 ENCOUNTER — Other Ambulatory Visit: Payer: 59

## 2013-05-19 ENCOUNTER — Other Ambulatory Visit (INDEPENDENT_AMBULATORY_CARE_PROVIDER_SITE_OTHER): Payer: 59

## 2013-05-19 DIAGNOSIS — N289 Disorder of kidney and ureter, unspecified: Secondary | ICD-10-CM

## 2013-05-19 DIAGNOSIS — I10 Essential (primary) hypertension: Secondary | ICD-10-CM

## 2013-05-19 LAB — BASIC METABOLIC PANEL
BUN: 16 mg/dL (ref 6–23)
CO2: 28 mEq/L (ref 19–32)
Calcium: 9.6 mg/dL (ref 8.4–10.5)
Chloride: 100 mEq/L (ref 96–112)
Creatinine, Ser: 1.3 mg/dL (ref 0.4–1.5)
GFR: 60.24 mL/min (ref >60.00)
Glucose, Bld: 107 mg/dL — ABNORMAL HIGH (ref 70–99)
POTASSIUM: 4 meq/L (ref 3.5–5.1)
Sodium: 135 mEq/L (ref 135–145)

## 2013-08-08 ENCOUNTER — Other Ambulatory Visit: Payer: Self-pay | Admitting: Family Medicine

## 2013-08-08 DIAGNOSIS — E785 Hyperlipidemia, unspecified: Secondary | ICD-10-CM

## 2013-08-11 ENCOUNTER — Other Ambulatory Visit (INDEPENDENT_AMBULATORY_CARE_PROVIDER_SITE_OTHER): Payer: 59

## 2013-08-11 DIAGNOSIS — I10 Essential (primary) hypertension: Secondary | ICD-10-CM

## 2013-08-11 DIAGNOSIS — E785 Hyperlipidemia, unspecified: Secondary | ICD-10-CM

## 2013-08-11 LAB — LIPID PANEL
CHOL/HDL RATIO: 4
Cholesterol: 235 mg/dL — ABNORMAL HIGH (ref 0–200)
HDL: 57.4 mg/dL (ref >39.00)
LDL Cholesterol: 154 mg/dL — ABNORMAL HIGH (ref 0–99)
NONHDL: 177.6
Triglycerides: 119 mg/dL (ref 0.0–149.0)
VLDL: 23.8 mg/dL (ref 0.0–40.0)

## 2013-08-11 LAB — COMPREHENSIVE METABOLIC PANEL
ALT: 17 U/L (ref 0–53)
AST: 19 U/L (ref 0–37)
Albumin: 4.1 g/dL (ref 3.5–5.2)
Alkaline Phosphatase: 51 U/L (ref 39–117)
BILIRUBIN TOTAL: 0.8 mg/dL (ref 0.2–1.2)
BUN: 16 mg/dL (ref 6–23)
CO2: 29 mEq/L (ref 19–32)
Calcium: 9.4 mg/dL (ref 8.4–10.5)
Chloride: 98 mEq/L (ref 96–112)
Creatinine, Ser: 1.2 mg/dL (ref 0.4–1.5)
GFR: 67.25 mL/min (ref >60.00)
Glucose, Bld: 106 mg/dL — ABNORMAL HIGH (ref 70–99)
Potassium: 4.2 mEq/L (ref 3.5–5.1)
Sodium: 132 mEq/L — ABNORMAL LOW (ref 135–145)
TOTAL PROTEIN: 7.4 g/dL (ref 6.0–8.3)

## 2013-08-17 ENCOUNTER — Encounter: Payer: Self-pay | Admitting: Family Medicine

## 2013-08-17 ENCOUNTER — Ambulatory Visit (INDEPENDENT_AMBULATORY_CARE_PROVIDER_SITE_OTHER): Payer: 59 | Admitting: Family Medicine

## 2013-08-17 VITALS — BP 140/78 | HR 60 | Temp 98.1°F | Ht 67.0 in | Wt 213.5 lb

## 2013-08-17 DIAGNOSIS — E7800 Pure hypercholesterolemia, unspecified: Secondary | ICD-10-CM

## 2013-08-17 DIAGNOSIS — Z7189 Other specified counseling: Secondary | ICD-10-CM

## 2013-08-17 DIAGNOSIS — I119 Hypertensive heart disease without heart failure: Secondary | ICD-10-CM

## 2013-08-17 DIAGNOSIS — E785 Hyperlipidemia, unspecified: Secondary | ICD-10-CM

## 2013-08-17 DIAGNOSIS — N186 End stage renal disease: Secondary | ICD-10-CM

## 2013-08-17 DIAGNOSIS — Z0000 Encounter for general adult medical examination without abnormal findings: Secondary | ICD-10-CM

## 2013-08-17 DIAGNOSIS — I7100 Dissection of unspecified site of aorta: Secondary | ICD-10-CM

## 2013-08-17 MED ORDER — ALPRAZOLAM 0.5 MG PO TABS: ORAL_TABLET | ORAL | Status: DC

## 2013-08-17 NOTE — Patient Instructions (Addendum)
Call about seeing the vascular surgery clinic.   Recheck lipids in about 3 months.  Work on your [employer] in the meantime.  We'll go from there.   Take care.  Glad to see you.  I would get a flu shot each fall.

## 2013-08-17 NOTE — Progress Notes (Signed)
Pre visit review using our clinic review tool, if applicable. No additional management support is needed unless otherwise documented below in the visit note.  CPE- See plan.  Routine anticipatory guidance given to patient.  See health maintenance. Tetanus 2007 Flu 2014 Shingles and PNA shot not due.  Colonoscopy 2012 Prostate cancer screening and PSA options (with potential risks and benefits of testing vs not testing) were discussed along with recent recs/guidelines.  He declined testing PSA at this point. Living will d/w pt.  Wife designated if patient were incapacitated.   Diet and exercise d/w pt.  "Better" with both.  On a treadmill at home, had been doing well until his mother in law became ill and that affected his routine.    Hypertension:    Using medication without problems or lightheadedness: yes Chest pain with exertion:no Edema:no Short of breath:no Average home BPs: occ elevations prev.  Noted inc in BP with salt loading.   Elevated Cholesterol: Using medications without problems:no meds Diet compliance:d/w pt.  Exercise: see above.  Labs d/w pt.  Recheck lipids in a few months, planned.  No statin use currently.    Anxiety.  Rare BZD use.  Needs refill.    He has no chest sx currently and he'll call about f/u with the vascular surgeons.    PMH and SH reviewed  Meds, vitals, and allergies reviewed.   ROS: See HPI.  Otherwise negative.    GEN: nad, alert and oriented HEENT: mucous membranes moist NECK: supple w/o LA CV: rrr. PULM: ctab, no inc wob ABD: soft, +bs EXT: no edema SKIN: no acute rash

## 2013-08-18 DIAGNOSIS — Z7189 Other specified counseling: Secondary | ICD-10-CM | POA: Insufficient documentation

## 2013-08-18 NOTE — Assessment & Plan Note (Signed)
Living will d/w pt.  Wife designated if patient were incapacitated.   ?

## 2013-08-18 NOTE — Assessment & Plan Note (Signed)
Needs more work on diet and exercise. If not improved in 3 months on recheck at lab visit the consider pravastatin. He agrees.  Labs d/w pt.

## 2013-08-18 NOTE — Assessment & Plan Note (Signed)
Reasonable control for now, he'll work on weight.  Labs d/w pt.

## 2013-08-18 NOTE — Assessment & Plan Note (Signed)
He'll call about vascular surgery follow up.

## 2013-08-18 NOTE — Assessment & Plan Note (Signed)
Routine anticipatory guidance given to patient.  See health maintenance. Tetanus 2007 Flu 2014 Shingles and PNA shot not due.  Colonoscopy 2012 Prostate cancer screening and PSA options (with potential risks and benefits of testing vs not testing) were discussed along with recent recs/guidelines.  He declined testing PSA at this point. Living will d/w pt.  Wife designated if patient were incapacitated.   Diet and exercise d/w pt.  "Better" with both.  On a treadmill at home, had been doing well until his mother in law became ill and that affected his routine.

## 2013-09-22 IMAGING — NM NM PULMONARY VENT & PERF
1 series · 12 of 12 positions shown · non-contrast
Comparison: Plain film chest of 1 day prior.

CLINICAL DATA: Rule out pulmonary embolism.  Shortness of breath.

NUCLEAR MEDICINE VENTILATION - PERFUSION LUNG SCAN
TECHNIQUE: Wash-in, equilibrium, and wash-out phase ventilation
images were obtained using Je-B44 gas.  Perfusion images were
obtained in multiple projections after intravenous injection of Tc-
99m MAA.
Radiopharmaceuticals:  10.0 mCi Je-B44 gas and 3.0 mCi Oc-00m MAA.

[Series 0: vq scan · 2.52mm/px · 2 acquisitions, 12 frames shown]
[im 1/2  full-range]
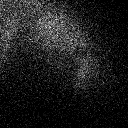
[im 1/2  full-range]
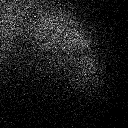
[im 1/2  full-range]
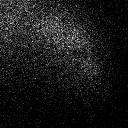
[im 1/2  full-range]
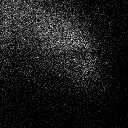
[im 1/2  full-range]
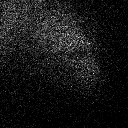
[im 1/2  full-range]
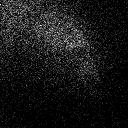
[im 2/2  full-range]
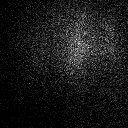
[im 2/2  full-range]
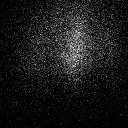
[im 2/2  full-range]
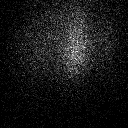
[im 2/2  full-range]
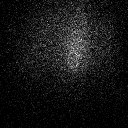
[im 2/2  full-range]
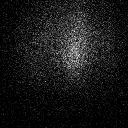
[im 2/2  full-range]
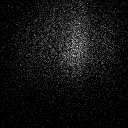

[12 of 12 positions shown; findings below may reference images not displayed]

FINDINGS: Ventilation images are nondiagnostic.  Per technologist
report, the patient could not hold his breath.

Perfusion images demonstrate a moderate sized defect involving the
right inferior lateral hemithorax.  A probable separate moderate
sized anterior defect within the right hemithorax on the lateral
image.
IMPRESSION: Intermediate probability for pulmonary embolism.  The perfusion
study demonstrates 1 and likely 2 moderate size defects.  However,
the ventilation study is nondiagnostic, secondary to technique
related factors.

## 2013-09-23 IMAGING — CR DG CHEST 1V PORT
1 series · 1 of 1 positions shown · non-contrast
Comparison: 1 day prior

CLINICAL DATA: Status post aortic valve replacement.

PORTABLE CHEST - 1 VIEW

[AP]
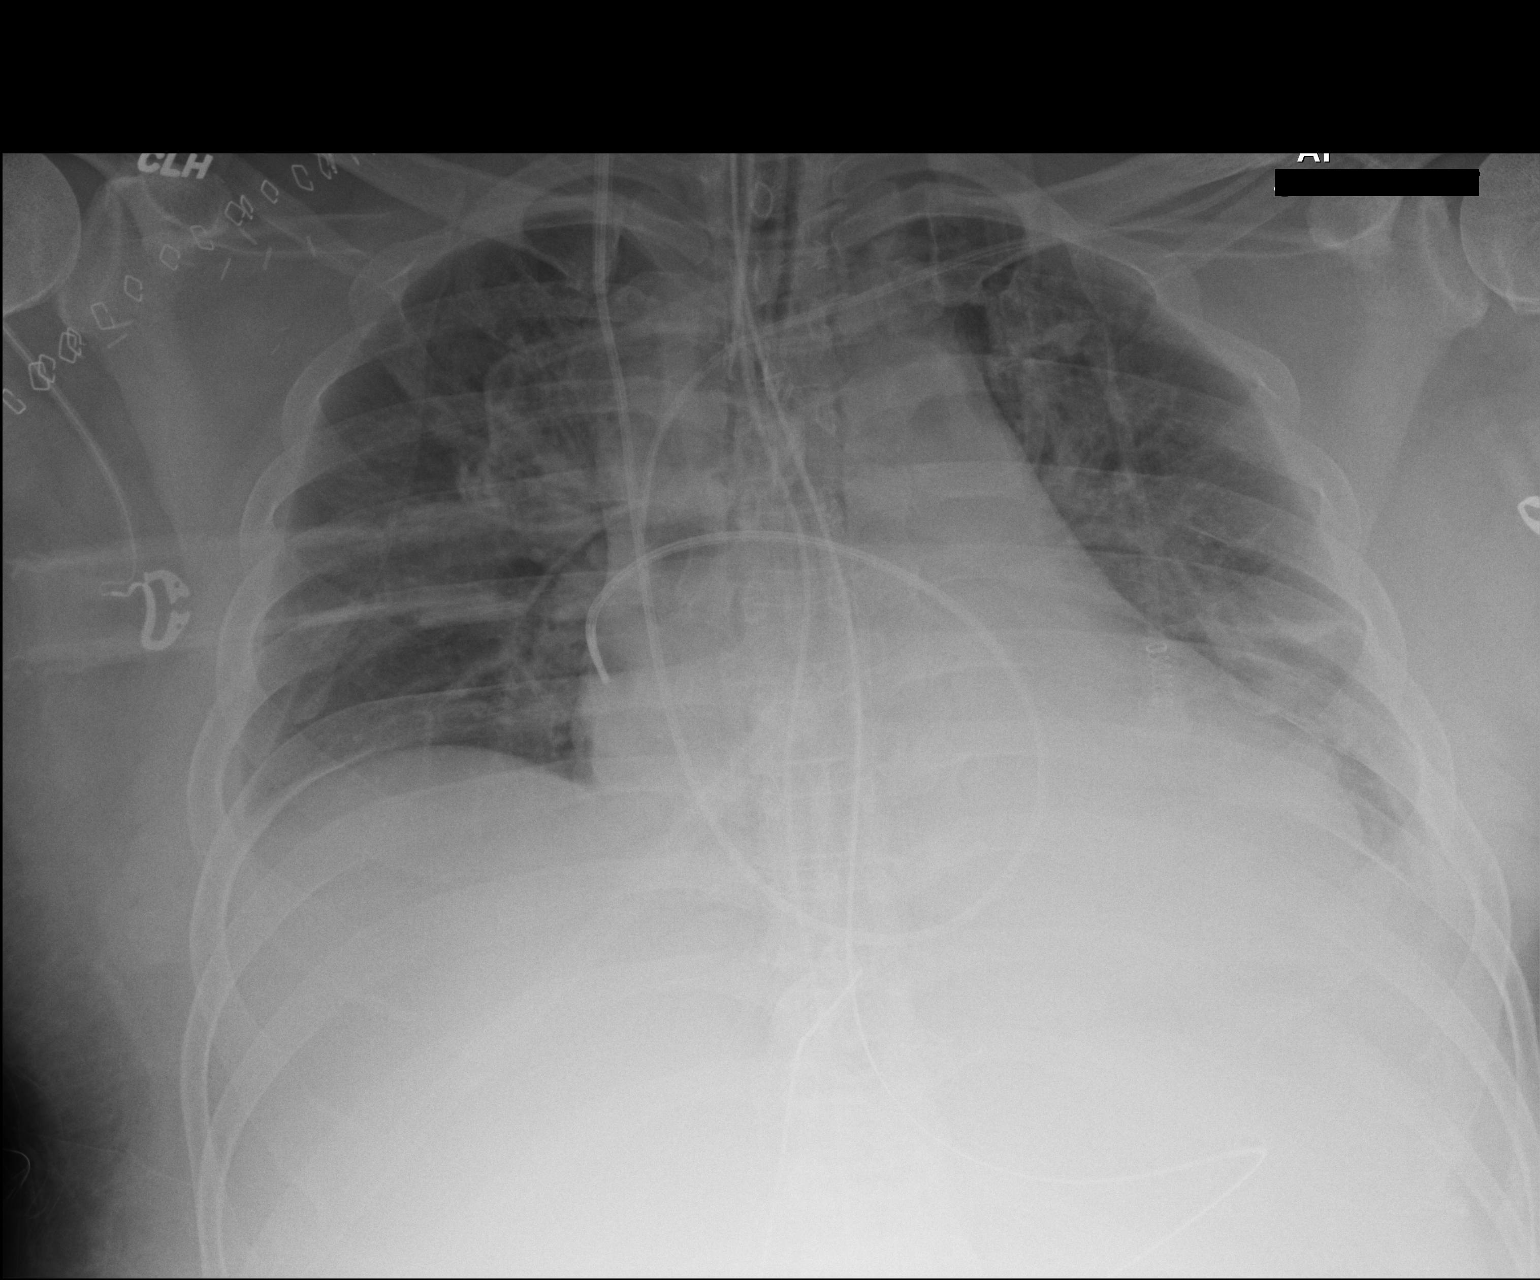

[1 of 1 positions shown; findings below may reference images not displayed]

FINDINGS: Endotracheal tube remains appropriately positioned.  A
Swan-Ganz catheter from a right IJ approach has tip within either
the main or proximal descending right pulmonary artery branch.

Left-sided subclavian line is poorly visualized centrally likely
similar.  Nasogastric tube extends beyond the  inferior aspect of
the film.

Cardiomegaly accentuated by AP portable technique.  Superior
mediastinal soft tissue fullness is likely due to AP portable
technique.  Suspect small bilateral pleural effusions. No
pneumothorax.  Lung volumes are diminished with similar moderate
interstitial edema.  Slightly improved left greater than right
lower lobe predominant atelectasis.
IMPRESSION: 1.  Slightly improved aeration with persistent interstitial edema
and decreased bibasilar atelectasis.
2.  Probable small bilateral pleural effusions.
3.  Swan-Ganz catheter with tip in distal main main or proximal
descending pulmonary artery branch.  Consider retraction 2 cm.

## 2013-09-24 IMAGING — CR DG CHEST 1V PORT
1 series · 1 of 1 positions shown · non-contrast
Comparison: 11/04/2011

CLINICAL DATA: Intubated.  CABG, PE, chest tube, Vent.

PORTABLE CHEST - 1 VIEW

[AP]
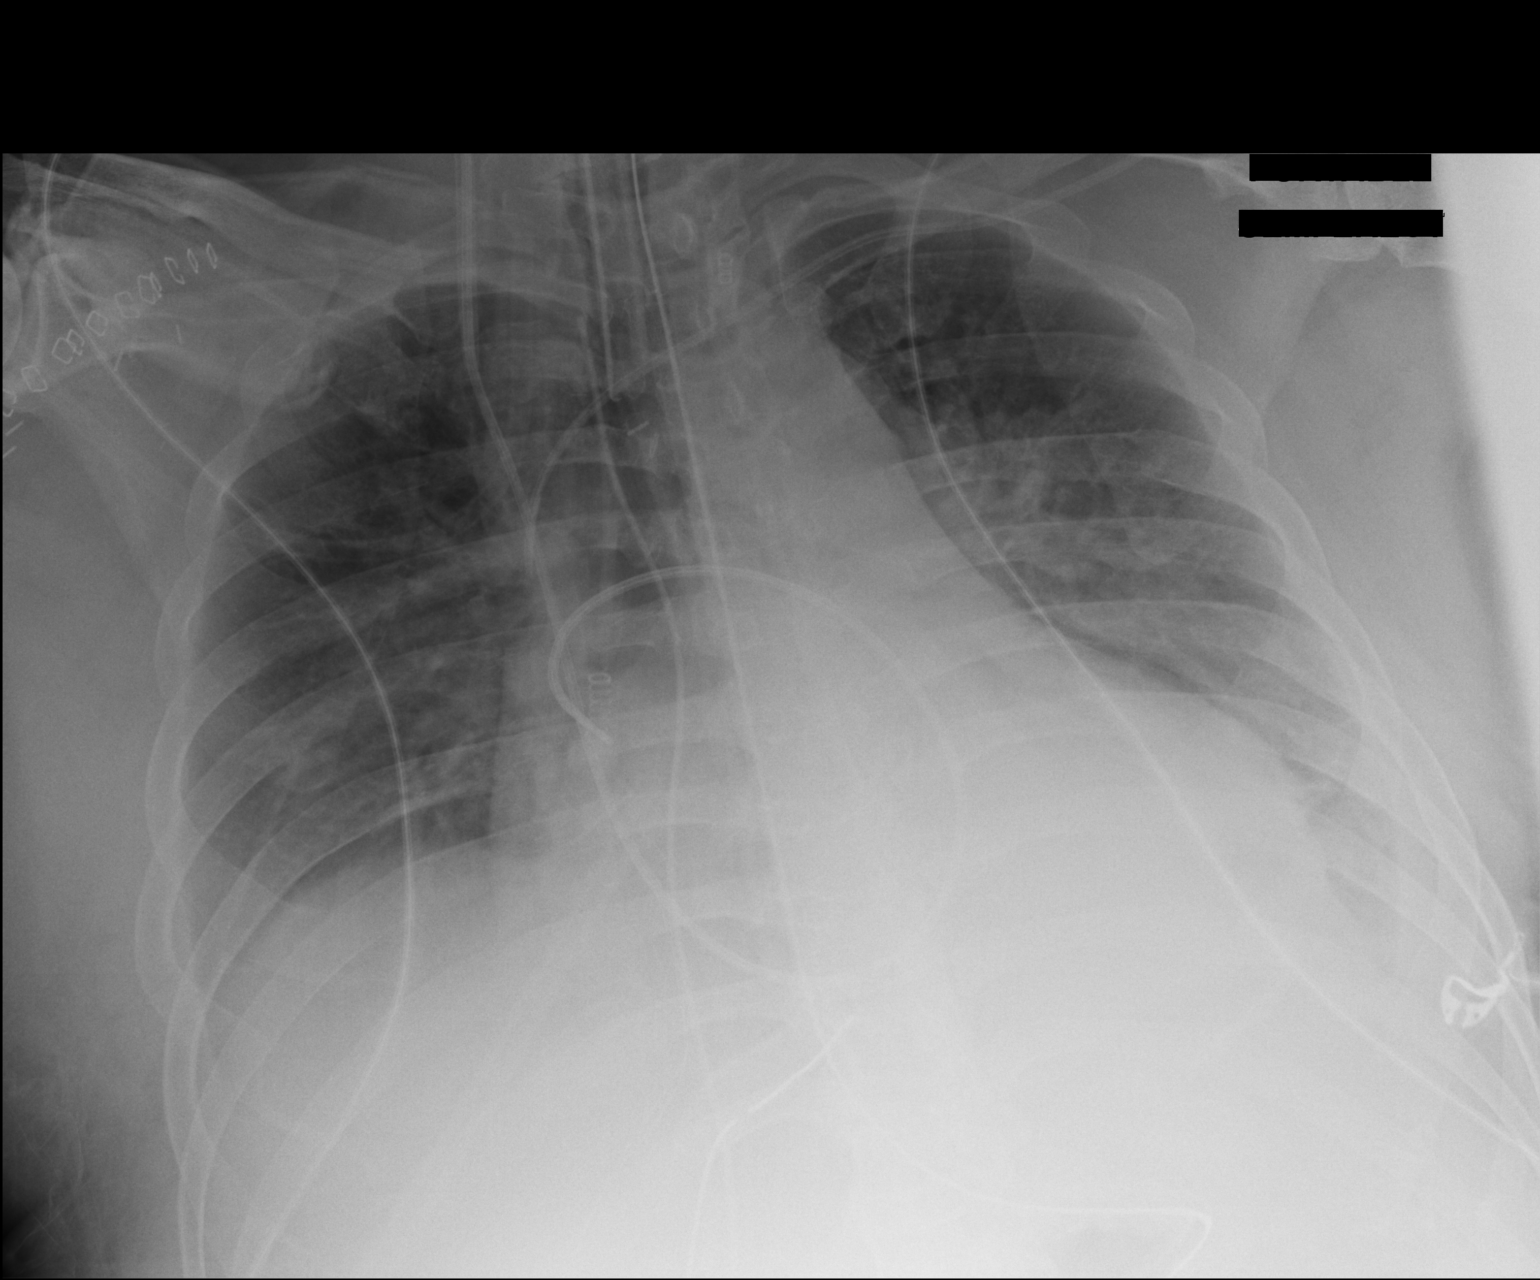

[1 of 1 positions shown; findings below may reference images not displayed]

FINDINGS: Nasogastric.  Endotracheal tube is in place, tip 5.0 cm
above carina.  A Swan-Ganz catheter tip is in place, tip directed
inferiorly in the region of the right lower lobe pulmonary artery.
Nasogastric tube is in place, tip off the film but beyond the
gastroesophageal junction.  A central venous line with inferior
approach has its tip overlying the superior vena cava.

Heart is enlarged.  There is dense opacity in the left lung base.
There is pulmonary vascular congestion and mild pulmonary edema.
Bilateral pleural effusions are present.
IMPRESSION: 1..  Swan-Ganz catheter tip likely directed to the right lower lobe
pulmonary artery.
2.  Cardiomegaly and pulmonary edema.
3.  Dense left lower lobe consolidation and/or atelectasis.

## 2013-09-27 IMAGING — CR DG CHEST 1V PORT
1 series · 1 of 1 positions shown · non-contrast
Comparison: 11/07/2011; 11/06/2011; chest CT - 10/30/2011

CLINICAL DATA: Thoracic aneurysm repair, cardiac tamponade.

PORTABLE CHEST - 1 VIEW

[AP]
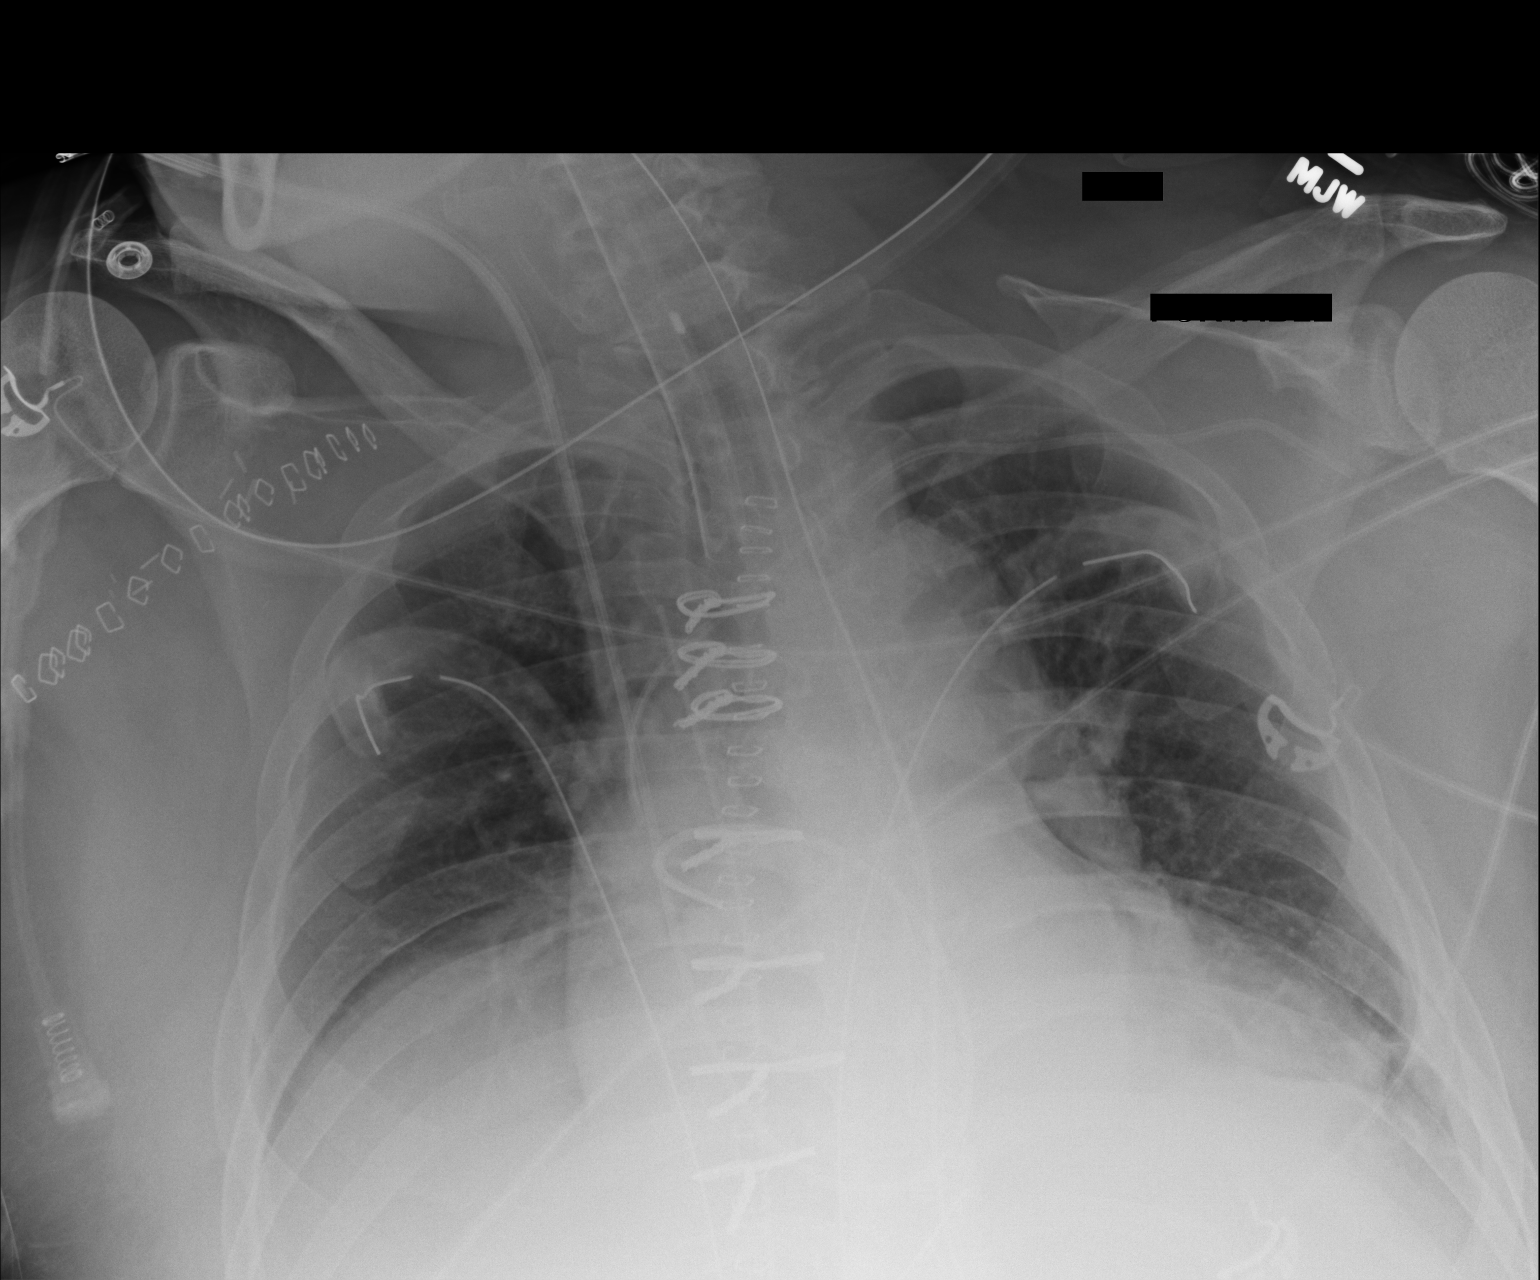

[1 of 1 positions shown; findings below may reference images not displayed]

FINDINGS: Grossly unchanged enlarged cardiac silhouette and mediastinal
contours.  Stable position of support apparatus including right
jugular approach PA catheter tip overlying expected location of the
right interlobar pulmonary artery.  Lung volumes remain
persistently reduced.  Grossly unchanged trace bilateral effusions
and bibasilar opacities.  No focal airspace opacity.  Unchanged
bones.  Skin staples overlie the right axilla.
IMPRESSION: 1.  Stable positioning of support apparatus.  No pneumothorax.
2.  Right jugular approach PA catheter tip remains overlying the
right intralobar pulmonary artery.
3.  Persistently reduced lung volumes with likely small bilateral
effusions and bibasilar opacities, likely atelectasis.

## 2013-09-28 IMAGING — CR DG CHEST 1V PORT
1 series · 1 of 1 positions shown · non-contrast
Comparison: 11/08/2011

CLINICAL DATA: AVR, intubated.

PORTABLE CHEST - 1 VIEW

[AP]
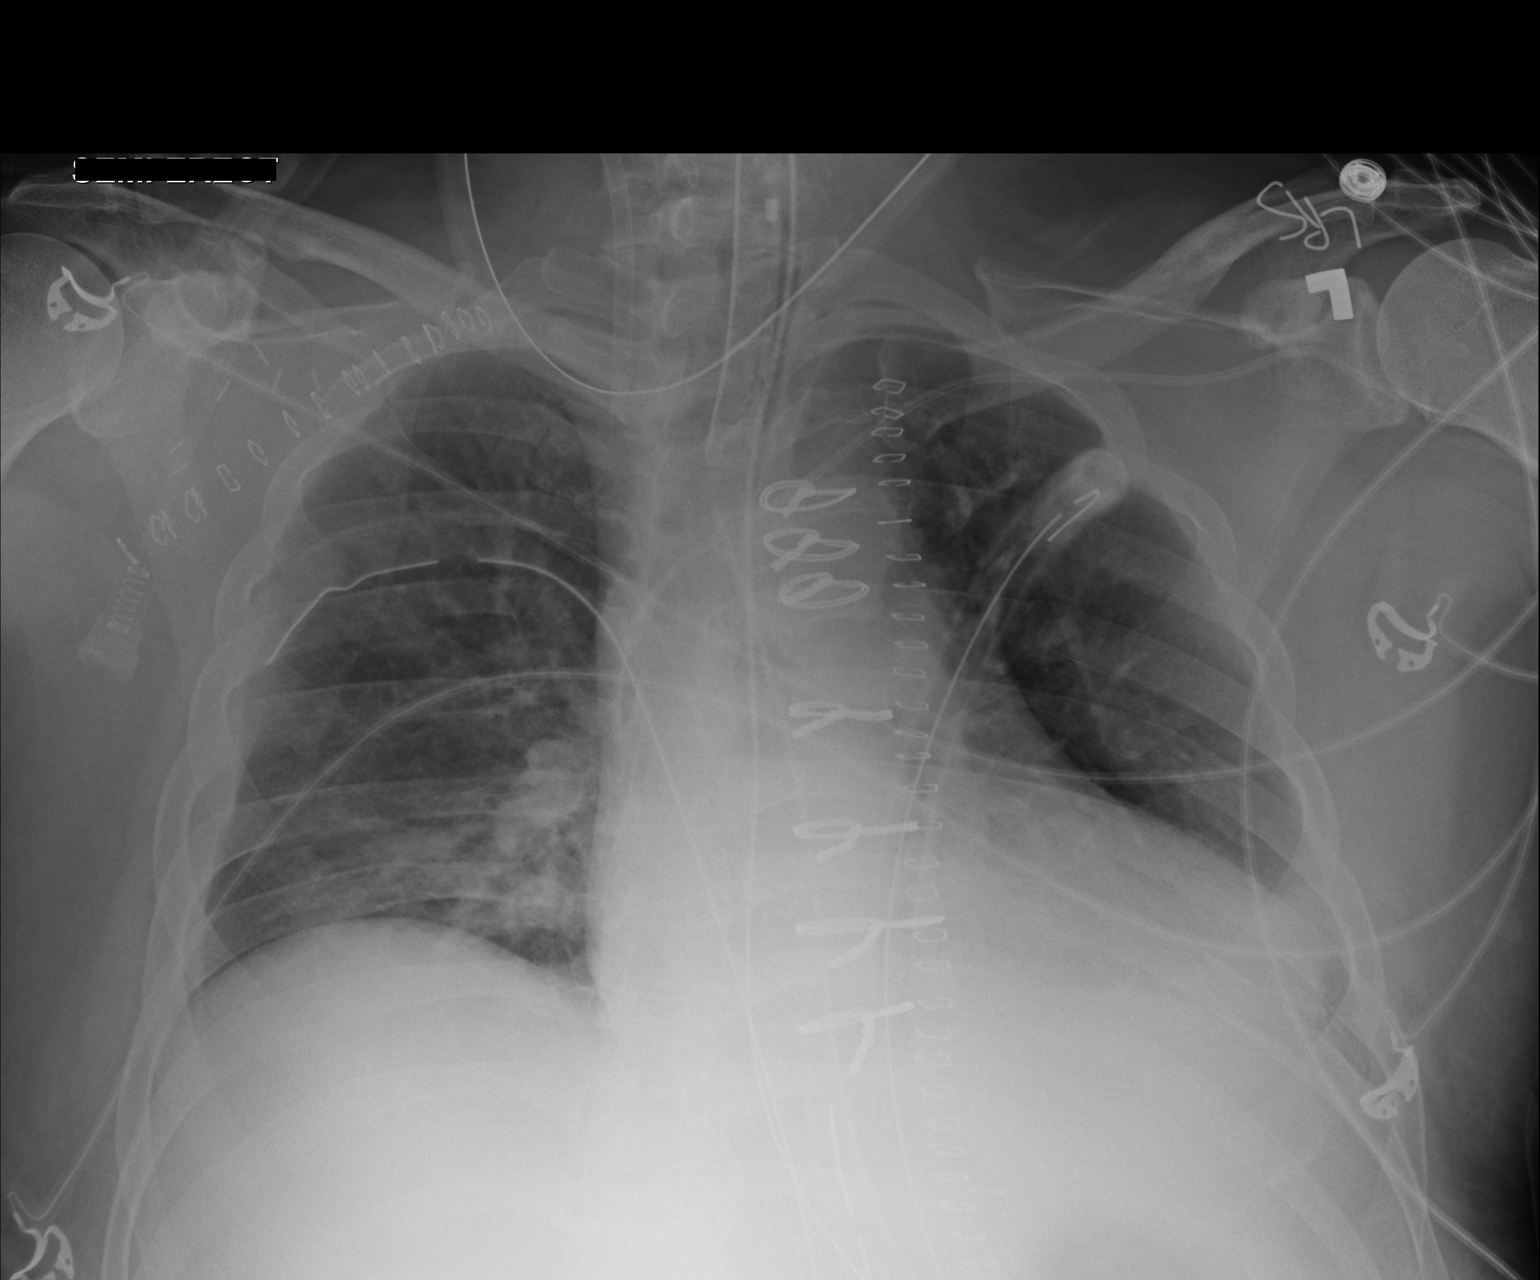

[1 of 1 positions shown; findings below may reference images not displayed]

FINDINGS: Endotracheal tube tip projects 4.8 cm proximal to the
carina.  NG tube descends below the level of the image.  Bilateral
chest tubes and mediastinal drain are in place.  Right IJ sheath
with interval removal of the Swan-Ganz catheter.  Left subclavian
central venous catheter tip projects over the proximal SVC.  Status
post median sternotomy and AVR.  Degraded by rotation.
Hypoaeration with interstitial and vascular crowding.  Mild
bibasilar opacities, likely atelectasis.  No interval osseous
change. Skin staples project over the midline and right axilla.
Right axillary surgical clips.
IMPRESSION: Interval removal of the Swan-Ganz catheter placement of a right IJ
sheath.

Endotracheal tube tip projects 4.8 cm proximal to the carina.
Support devices otherwise unchanged.

Postoperative changes and bibasilar opacities, likely atelectasis.

## 2013-11-20 ENCOUNTER — Other Ambulatory Visit: Payer: Self-pay

## 2014-01-13 ENCOUNTER — Inpatient Hospital Stay (HOSPITAL_COMMUNITY)
Admission: EM | Admit: 2014-01-13 | Discharge: 2014-01-15 | DRG: 062 | Disposition: A | Discharge status: Home / Self Care | Payer: 59 | Attending: Neurology | Admitting: Neurology

## 2014-01-13 ENCOUNTER — Emergency Department (HOSPITAL_COMMUNITY): Payer: 59

## 2014-01-13 DIAGNOSIS — Z86711 Personal history of pulmonary embolism: Secondary | ICD-10-CM

## 2014-01-13 DIAGNOSIS — E785 Hyperlipidemia, unspecified: Secondary | ICD-10-CM | POA: Diagnosis present

## 2014-01-13 DIAGNOSIS — E871 Hypo-osmolality and hyponatremia: Secondary | ICD-10-CM | POA: Diagnosis present

## 2014-01-13 DIAGNOSIS — R531 Weakness: Secondary | ICD-10-CM | POA: Diagnosis present

## 2014-01-13 DIAGNOSIS — I69398 Other sequelae of cerebral infarction: Secondary | ICD-10-CM

## 2014-01-13 DIAGNOSIS — I69328 Other speech and language deficits following cerebral infarction: Secondary | ICD-10-CM | POA: Diagnosis not present

## 2014-01-13 DIAGNOSIS — E876 Hypokalemia: Secondary | ICD-10-CM | POA: Diagnosis present

## 2014-01-13 DIAGNOSIS — I69392 Facial weakness following cerebral infarction: Secondary | ICD-10-CM | POA: Diagnosis not present

## 2014-01-13 DIAGNOSIS — Z8249 Family history of ischemic heart disease and other diseases of the circulatory system: Secondary | ICD-10-CM | POA: Diagnosis not present

## 2014-01-13 DIAGNOSIS — Z87891 Personal history of nicotine dependence: Secondary | ICD-10-CM

## 2014-01-13 DIAGNOSIS — I639 Cerebral infarction, unspecified: Secondary | ICD-10-CM

## 2014-01-13 DIAGNOSIS — H532 Diplopia: Secondary | ICD-10-CM | POA: Diagnosis present

## 2014-01-13 DIAGNOSIS — R739 Hyperglycemia, unspecified: Secondary | ICD-10-CM | POA: Diagnosis present

## 2014-01-13 DIAGNOSIS — I10 Essential (primary) hypertension: Secondary | ICD-10-CM | POA: Diagnosis present

## 2014-01-13 DIAGNOSIS — I63412 Cerebral infarction due to embolism of left middle cerebral artery: Secondary | ICD-10-CM | POA: Diagnosis present

## 2014-01-13 DIAGNOSIS — Z6834 Body mass index (BMI) 34.0-34.9, adult: Secondary | ICD-10-CM

## 2014-01-13 DIAGNOSIS — I4510 Unspecified right bundle-branch block: Secondary | ICD-10-CM | POA: Diagnosis present

## 2014-01-13 DIAGNOSIS — E669 Obesity, unspecified: Secondary | ICD-10-CM | POA: Diagnosis present

## 2014-01-13 DIAGNOSIS — I69331 Monoplegia of upper limb following cerebral infarction affecting right dominant side: Secondary | ICD-10-CM

## 2014-01-13 LAB — COMPREHENSIVE METABOLIC PANEL
ALK PHOS: 62 U/L (ref 39–117)
ALT: 18 U/L (ref 0–53)
ANION GAP: 15 (ref 5–15)
AST: 20 U/L (ref 0–37)
Albumin: 3.7 g/dL (ref 3.5–5.2)
BILIRUBIN TOTAL: 0.4 mg/dL (ref 0.3–1.2)
BUN: 16 mg/dL (ref 6–23)
CHLORIDE: 92 meq/L — AB (ref 96–112)
CO2: 23 meq/L (ref 19–32)
Calcium: 9.7 mg/dL (ref 8.4–10.5)
Creatinine, Ser: 1.1 mg/dL (ref 0.50–1.35)
GFR calc Af Amer: 85 mL/min — ABNORMAL LOW (ref >90)
GFR calc non Af Amer: 73 mL/min — ABNORMAL LOW (ref >90)
Glucose, Bld: 118 mg/dL — ABNORMAL HIGH (ref 70–99)
Potassium: 3.3 mEq/L — ABNORMAL LOW (ref 3.7–5.3)
Sodium: 130 mEq/L — ABNORMAL LOW (ref 137–147)
Total Protein: 7.4 g/dL (ref 6.0–8.3)

## 2014-01-13 LAB — I-STAT CHEM 8, ED
BUN: 20 mg/dL (ref 6–23)
CHLORIDE: 94 meq/L — AB (ref 96–112)
CREATININE: 1.2 mg/dL (ref 0.50–1.35)
Calcium, Ion: 1.19 mmol/L (ref 1.12–1.23)
GLUCOSE: 122 mg/dL — AB (ref 70–99)
HCT: 46 % (ref 39.0–52.0)
Hemoglobin: 15.6 g/dL (ref 13.0–17.0)
Potassium: 3.3 mEq/L — ABNORMAL LOW (ref 3.7–5.3)
SODIUM: 135 meq/L — AB (ref 137–147)
TCO2: 24 mmol/L (ref 0–100)

## 2014-01-13 LAB — DIFFERENTIAL
BASOS PCT: 0 % (ref 0–1)
Basophils Absolute: 0 10*3/uL (ref 0.0–0.1)
Eosinophils Absolute: 0.4 10*3/uL (ref 0.0–0.7)
Eosinophils Relative: 4 % (ref 0–5)
LYMPHS PCT: 29 % (ref 12–46)
Lymphs Abs: 2.5 10*3/uL (ref 0.7–4.0)
MONOS PCT: 9 % (ref 3–12)
Monocytes Absolute: 0.8 10*3/uL (ref 0.1–1.0)
NEUTROS ABS: 5.1 10*3/uL (ref 1.7–7.7)
Neutrophils Relative %: 58 % (ref 43–77)

## 2014-01-13 LAB — CBC
HCT: 41.3 % (ref 39.0–52.0)
Hemoglobin: 14.5 g/dL (ref 13.0–17.0)
MCH: 30.7 pg (ref 26.0–34.0)
MCHC: 35.1 g/dL (ref 30.0–36.0)
MCV: 87.3 fL (ref 78.0–100.0)
Platelets: 177 10*3/uL (ref 150–400)
RBC: 4.73 MIL/uL (ref 4.22–5.81)
RDW: 12.6 % (ref 11.5–15.5)
WBC: 8.8 10*3/uL (ref 4.0–10.5)

## 2014-01-13 LAB — PROTIME-INR
INR: 0.96 (ref 0.00–1.49)
Prothrombin Time: 12.9 seconds (ref 11.6–15.2)

## 2014-01-13 LAB — CBG MONITORING, ED: Glucose-Capillary: 116 mg/dL — ABNORMAL HIGH (ref 70–99)

## 2014-01-13 LAB — APTT: APTT: 30 s (ref 24–37)

## 2014-01-13 LAB — I-STAT TROPONIN, ED: Troponin i, poc: 0 ng/mL (ref 0.00–0.08)

## 2014-01-13 MED ORDER — ACETAMINOPHEN 650 MG RE SUPP: 650.0000 mg | RECTAL | Status: DC | PRN

## 2014-01-13 MED ORDER — POTASSIUM CHLORIDE 10 MEQ/100ML IV SOLN
10.0000 meq | INTRAVENOUS | Status: AC
  Administered 2014-01-13 – 2014-01-14 (×2): 10 meq via INTRAVENOUS
  Filled 2014-01-13 (×2): qty 100

## 2014-01-13 MED ORDER — LABETALOL HCL 5 MG/ML IV SOLN
10.0000 mg | Freq: Once | INTRAVENOUS | Status: AC
  Administered 2014-01-13: 10 mg via INTRAVENOUS

## 2014-01-13 MED ORDER — PANTOPRAZOLE SODIUM 40 MG IV SOLR
40.0000 mg | Freq: Every day | INTRAVENOUS | Status: DC
  Administered 2014-01-13: 40 mg via INTRAVENOUS
  Filled 2014-01-13 (×2): qty 40

## 2014-01-13 MED ORDER — LABETALOL HCL 5 MG/ML IV SOLN
10.0000 mg | INTRAVENOUS | Status: DC | PRN
  Administered 2014-01-14: 10 mg via INTRAVENOUS
  Filled 2014-01-13: qty 4

## 2014-01-13 MED ORDER — SODIUM CHLORIDE 0.9 % IV SOLN
INTRAVENOUS | Status: DC
  Administered 2014-01-13 – 2014-01-14 (×2): via INTRAVENOUS

## 2014-01-13 MED ORDER — NICARDIPINE HCL IN NACL 20-0.86 MG/200ML-% IV SOLN
3.0000 mg/h | INTRAVENOUS | Status: DC
Start: 2014-01-13 — End: 2014-01-15
  Administered 2014-01-13 – 2014-01-14 (×2): 5 mg/h via INTRAVENOUS
  Filled 2014-01-13: qty 400

## 2014-01-13 MED ORDER — LABETALOL HCL 5 MG/ML IV SOLN
INTRAVENOUS | Status: AC
  Filled 2014-01-13: qty 4

## 2014-01-13 MED ORDER — STROKE: EARLY STAGES OF RECOVERY BOOK
Freq: Once | Status: AC
  Administered 2014-01-13: 23:00:00
  Filled 2014-01-13: qty 1

## 2014-01-13 MED ORDER — ACETAMINOPHEN 325 MG PO TABS
650.0000 mg | ORAL_TABLET | ORAL | Status: DC | PRN
  Administered 2014-01-14: 650 mg via ORAL
  Filled 2014-01-13: qty 2

## 2014-01-13 MED ORDER — ALTEPLASE (STROKE) FULL DOSE INFUSION
0.9000 mg/kg | Freq: Once | INTRAVENOUS | Status: AC
  Administered 2014-01-13: 88 mg via INTRAVENOUS
  Filled 2014-01-13: qty 88

## 2014-01-13 NOTE — H&P (Signed)
Neurology H&P  CC: Right handed weakness  History is obtained from: Patient, wife  HPI: Gabriel Tran is a 58 y.o. male with hypertension, hyperglycemia but not diagnosed with diabetes, renal insufficiency who presents with sudden onset right hand weakness and facial droop that started suddenly at 8:25 PM. He called 911 and EMS transported him and activated a code stroke en route. CT was negative and there were no contraindications to TPA and therefore the decision to give TPA was made after discussing risks and benefits with him and his wife. He was hypertensive and despite treatment remained hypertensive. Finally with a Cardene drip his blood pressure lowered to arrange for TPA can be initiated and was started.   LKW: 8:25 PM tpa given?: Yes   ROS: A 14 point ROS was performed and is negative except as noted in the HPI.   Past Medical History  Diagnosis Date  . Hypertension   . Hyperglycemia     a. A1C 5.9 in Sept 2013.  Marland Kitchen RBBB   . Renal insufficiency 08/28/2012  Aortic aneurysm repair   Family History: Brother-heart disease Father-heart disease Mother-parkinsonism  Social History: Tob: Quit 1994  Exam: Current vital signs: BP 193/76 mmHg  Pulse 73  Resp 19  Wt 97.523 kg (215 lb)  SpO2 99% Vital signs in last 24 hours: Pulse Rate:  [67-75] 73 (12/09 2145) Resp:  [13-19] 19 (12/09 2145) BP: (185-200)/(76-110) 193/76 mmHg (12/09 2145) SpO2:  [99 %-100 %] 99 % (12/09 2145) Weight:  [97.523 kg (215 lb)] 97.523 kg (215 lb) (12/09 2120)  General: In bed, NAD  Physical Exam  Constitutional: Appears well-developed and well-nourished.  Psych: Affect appropriate to situation Eyes: No scleral injection HENT: No OP obstrucion Head: Normocephalic.  Cardiovascular: Normal rate and regular rhythm. Possible mild systolic murmur Respiratory: Effort normal and breath sounds normal to anterior ascultation GI: Soft.  No distension. There is no tenderness.  Skin:  WDI  Neuro: Mental Status: Patient is awake, alert, oriented to person, place, month, year, and situation. Patient is able to give a clear and coherent history. No signs of aphasia or neglect He is mildly dysarthric Cranial Nerves: II: Visual Fields are full. Pupils are equal, round, and reactive to light.   III,IV, VI: EOMI without ptosis or diploplia.  V: Facial sensation is decreased on right  VII: Facial movement is notable for prominent lower right facial weakness as well as some fore head involvement. VIII: hearing is intact to voice X: Uvula elevates symmetrically XI: Shoulder shrug is symmetric. XII: tongue is midline without atrophy or fasciculations.  Motor: Tone is normal. Bulk is normal. 5/5 strength was present on the left and in the right leg. He has good proximal strength of the right arm without drift, however has markedly impaired fine motor movements of the right hand. Sensory: Sensation is decreased to pin on the right side Deep Tendon Reflexes: 2+ and symmetric in the biceps and patellae.  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 He is receiving IV TPA and will be admitted to the intensive care unit given the degree of hypertension requiring a Cardene drip.  Recommendations: 1. HgbA1c, fasting lipid panel 2.  MRI, MRA  of the brain without contrast 3. Frequent neuro checks 4. Echocardiogram 5. Carotid dopplers 6. Prophylactic therapy-Antiplatelet med: Aspirin - dose 325mg  PO or 300mg  PR 7. Risk factor modification 8. Telemetry monitoring 9. PT consult, OT consult, Speech consult 10. Nicardipine for accelerated  hypertension. 11. IV K 20 mEQ for hypokalemia 12. Will repeat BMP in the AM to monitor hyponatremia.   This patient is critically ill and at significant risk of neurological worsening, death and care requires constant monitoring of vital signs, hemodynamics,respiratory and cardiac monitoring, neurological assessment, discussion with family, other specialists and medical decision making of high complexity. I spent 50 minutes of neurocritical care time  in the care of  this patient.  Roland Rack, MD [facility] [phone]  If 7pm- 7am, please page neurology on call as listed in [place]. 01/13/2014  10:28 PM

## 2014-01-13 NOTE — ED Provider Notes (Signed)
CSN: [id number]     Arrival date & time 01/13/14  2111 History   First MD Initiated Contact with Patient 01/13/14 2138     No chief complaint on file.    (Consider location/radiation/quality/duration/timing/severity/associated sxs/prior Treatment) Patient is a 58 y.o. male presenting with neurologic complaint. The history is provided by the patient.  Neurologic Problem This is a new problem. The current episode started 1 to 2 hours ago. The problem occurs constantly. The problem has not changed since onset.Pertinent negatives include no chest pain, no abdominal pain, no headaches and no shortness of breath. Nothing aggravates the symptoms. Nothing relieves the symptoms. He has tried nothing for the symptoms. The treatment provided no relief.    Past Medical History  Diagnosis Date  . Hypertension   . Hyperglycemia     a. A1C 5.9 in Sept 2013.  Marland Kitchen RBBB   . Renal insufficiency 08/28/2012   Past Surgical History  Procedure Laterality Date  . Knee surgery      LEFT 1981  . Carpal tunnel release      2004 RIGHT  . Wisdom tooth extraction    . Thoracic aortic aneurysm repair  10/30/2011    Procedure: THORACIC ASCENDING ANEURYSM REPAIR (AAA);  Surgeon: Ivin Poot, MD;  Location: [facility];  Service: Open Heart Surgery;  Laterality: N/A;  Ascending aortic dissection repair, arch reconstruction, aortic valve repair  . Insertion of dialysis catheter  11/06/2011    Procedure: INSERTION OF DIALYSIS CATHETER;  Surgeon: Ivin Poot, MD;  Location: [facility];  Service: Thoracic;  Laterality: Right;  . Mediastinal exploration  11/06/2011    Procedure: MEDIASTINAL EXPLORATION;  Surgeon: Ivin Poot, MD;  Location: [facility];  Service: Thoracic;  Laterality: N/A;  sternal closure; removal of wound vac  . Insertion of dialysis catheter  11/13/2011    Procedure: INSERTION OF DIALYSIS CATHETER;  Surgeon: Elam Dutch, MD;  Location: [facility];  Service: Vascular;  Laterality: N/A;  Ultrasound guided.    Family History  Problem Relation Age of Onset  . Parkinsonism Mother   . Leukemia Father     AML at 53  . Colon polyps Brother   . Kidney disease Brother   . Heart disease Brother     CABG at age 18.  . Prostate cancer Neg Hx   . Colon cancer Neg Hx   . Heart disease Father     Angioplasty in his 86s, CABG age 76.   History  Substance Use Topics  . Smoking status: Former Smoker    Types: Cigarettes    Quit date: 02/05/1990  . Smokeless tobacco: Not on file     Comment: Smoked for 20 years.  . Alcohol Use: 0.0 oz/week     Comment: 6 beers a week on average    Review of Systems  Constitutional: Negative for fever.  HENT: Negative for drooling and rhinorrhea.   Eyes: Negative for pain.  Respiratory: Negative for cough and shortness of breath.   Cardiovascular: Negative for chest pain and leg swelling.  Gastrointestinal: Negative for nausea, vomiting, abdominal pain and diarrhea.  Genitourinary: Negative for dysuria and hematuria.  Musculoskeletal: Negative for gait problem and neck pain.  Skin: Negative for color change.  Neurological: Negative for numbness and headaches.  Hematological: Negative for adenopathy.  Psychiatric/Behavioral: Negative for behavioral problems.  All other systems reviewed and are negative.     Allergies  Lipitor  Home Medications   Prior to Admission medications   Medication  Sig Start Date End Date Taking? Authorizing Provider  ALPRAZolam Duanne Moron) 0.5 MG tablet take 1 tablet by mouth every 6 hours if needed for anxiety 08/17/13   Tonia Ghent, MD  aspirin 81 MG tablet Take 81 mg by mouth daily.    Historical Provider, MD  diltiazem (CARDIZEM CD) 180 MG 24 hr capsule Take 1 capsule (180 mg total) by mouth daily. 05/11/13   Liliane Shi, PA-C  hydrochlorothiazide (HYDRODIURIL) 12.5 MG tablet Take 1 tablet (12.5 mg total) by mouth daily. 05/11/13   Liliane Shi, PA-C  losartan (COZAAR) 50 MG tablet Take 1 tablet (50 mg total) by mouth  daily. 05/11/13   Liliane Shi, PA-C  metoprolol tartrate (LOPRESSOR) 25 MG tablet Take 1 tablet (25 mg total) by mouth 2 (two) times daily. 05/11/13   Liliane Shi, PA-C  potassium chloride (K-DUR) 10 MEQ tablet take 1 tablet by mouth once daily 05/11/13   Liliane Shi, PA-C   BP 194/80 mmHg  Pulse 74  Resp 17  Wt 215 lb (97.523 kg)  SpO2 99% Physical Exam  Constitutional: He is oriented to person, place, and time. He appears well-developed and well-nourished.  HENT:  Head: Normocephalic and atraumatic.  Right Ear: External ear normal.  Left Ear: External ear normal.  Nose: Nose normal.  Mouth/Throat: Oropharynx is clear and moist. No oropharyngeal exudate.  Eyes: Conjunctivae and EOM are normal. Pupils are equal, round, and reactive to light.  Neck: Normal range of motion. Neck supple.  Cardiovascular: Normal rate, regular rhythm, normal heart sounds and intact distal pulses.  Exam reveals no gallop and no friction rub.   No murmur heard. Pulmonary/Chest: Effort normal and breath sounds normal. No respiratory distress. He has no wheezes.  Abdominal: Soft. Bowel sounds are normal. He exhibits no distension. There is no tenderness. There is no rebound and no guarding.  Musculoskeletal: Normal range of motion. He exhibits no edema or tenderness.  Neurological: He is alert and oriented to person, place, and time.  Right-sided facial droop noted on exam.  Skin: Skin is warm and dry.  Psychiatric: He has a normal mood and affect. His behavior is normal.  Nursing note and vitals reviewed.   ED Course  Procedures (including critical care time) Labs Review Labs Reviewed  COMPREHENSIVE METABOLIC PANEL - Abnormal; Notable for the following:    Sodium 130 (*)    Potassium 3.3 (*)    Chloride 92 (*)    Glucose, Bld 118 (*)    GFR calc non Af Amer 73 (*)    GFR calc Af Amer 85 (*)    All other components within normal limits  CBG MONITORING, ED - Abnormal; Notable for the following:     Glucose-Capillary 116 (*)    All other components within normal limits  I-STAT CHEM 8, ED - Abnormal; Notable for the following:    Sodium 135 (*)    Potassium 3.3 (*)    Chloride 94 (*)    Glucose, Bld 122 (*)    All other components within normal limits  MRSA PCR SCREENING  PROTIME-INR  APTT  CBC  DIFFERENTIAL  BASIC METABOLIC PANEL  HEMOGLOBIN A1C  LIPID PANEL  I-STAT TROPOININ, ED    Imaging Review Ct Head (brain) Wo Contrast  01/13/2014   CLINICAL DATA:  Right-sided weakness.  Stroke symptoms  EXAM: CT HEAD WITHOUT CONTRAST  TECHNIQUE: Contiguous axial images were obtained from the base of the skull through the vertex without intravenous  contrast.  COMPARISON:  10/29/2011  FINDINGS: Mild ventricular dilatation may indicate early central atrophy or normal variation. This is unchanged since previous study. Sulci appear symmetrical. No mass effect or midline shift. No abnormal extra-axial fluid collections. Gray-white matter junctions are distinct. Basal cisterns are not effaced. No evidence of acute intracranial hemorrhage. No depressed skull fractures. Visualized paranasal sinuses and mastoid air cells are not opacified.  IMPRESSION: No acute intracranial abnormalities. Unchanged appearance of symmetrical prominence of lateral ventricles.   Electronically Signed   By: Lucienne Capers M.D.   On: 01/13/2014 21:24     EKG Interpretation   Date/Time:  Wednesday January 13 2014 21:29:42 EST Ventricular Rate:  75 PR Interval:  205 QRS Duration: 181 QT Interval:  445 QTC Calculation: 497 R Axis:   -7 Text Interpretation:  Sinus rhythm Borderline prolonged PR interval  Probable left atrial enlargement Right bundle branch block Inferior  infarct, old No significant change since last tracing Confirmed by  Mandisa Persinger  MD, Irini Leet ([id number]) on 01/13/2014 9:38:24 PM     CRITICAL CARE Performed by: Pamella Pert, S Total critical care time: 30 min Critical care time was exclusive  of separately billable procedures and treating other patients. Critical care was necessary to treat or prevent imminent or life-threatening deterioration. Critical care was time spent personally by me on the following activities: development of treatment plan with patient and/or surrogate as well as nursing, discussions with consultants, evaluation of patient's response to treatment, examination of patient, obtaining history from patient or surrogate, ordering and performing treatments and interventions, ordering and review of laboratory studies, ordering and review of radiographic studies, pulse oximetry and re-evaluation of patient's condition.  MDM   Final diagnoses:  Stroke    9:42 PM 58 y.o. male who presents with sudden onset right-sided numbness, weakness and right-sided facial droop which began this evening at approximately 8:25 PM.  Patient seen by neurology in the ER. Plan to give TPA once we get BP under control w/ cardene gtt.   TPA given to pt. Neuro to admit.       Pamella Pert, MD 01/14/14 [phone]

## 2014-01-13 NOTE — ED Notes (Signed)
Cardene drip increased to 7.5 mg/hr.

## 2014-01-13 NOTE — ED Notes (Signed)
Cardene drip decreased to 5 mg/hr due to BP 158/72

## 2014-01-13 NOTE — ED Notes (Signed)
Cardene drip started at 5mg /hr per Dr. Leonel Ramsay

## 2014-01-14 ENCOUNTER — Inpatient Hospital Stay (HOSPITAL_COMMUNITY): Payer: 59

## 2014-01-14 ENCOUNTER — Encounter (HOSPITAL_COMMUNITY): Payer: Self-pay | Admitting: Neurology

## 2014-01-14 DIAGNOSIS — I639 Cerebral infarction, unspecified: Secondary | ICD-10-CM | POA: Insufficient documentation

## 2014-01-14 DIAGNOSIS — I359 Nonrheumatic aortic valve disorder, unspecified: Secondary | ICD-10-CM

## 2014-01-14 DIAGNOSIS — I63011 Cerebral infarction due to thrombosis of right vertebral artery: Secondary | ICD-10-CM

## 2014-01-14 LAB — BASIC METABOLIC PANEL
Anion gap: 12 (ref 5–15)
BUN: 16 mg/dL (ref 6–23)
CHLORIDE: 98 meq/L (ref 96–112)
CO2: 26 mEq/L (ref 19–32)
Calcium: 9.6 mg/dL (ref 8.4–10.5)
Creatinine, Ser: 1.07 mg/dL (ref 0.50–1.35)
GFR calc Af Amer: 88 mL/min — ABNORMAL LOW (ref >90)
GFR, EST NON AFRICAN AMERICAN: 76 mL/min — AB (ref >90)
Glucose, Bld: 109 mg/dL — ABNORMAL HIGH (ref 70–99)
Potassium: 4 mEq/L (ref 3.7–5.3)
Sodium: 136 mEq/L — ABNORMAL LOW (ref 137–147)

## 2014-01-14 LAB — LIPID PANEL
CHOL/HDL RATIO: 4 ratio
CHOLESTEROL: 226 mg/dL — AB (ref 0–200)
HDL: 56 mg/dL (ref >39)
LDL Cholesterol: 148 mg/dL — ABNORMAL HIGH (ref 0–99)
Triglycerides: 108 mg/dL (ref <150)
VLDL: 22 mg/dL (ref 0–40)

## 2014-01-14 LAB — HEMOGLOBIN A1C
Hgb A1c MFr Bld: 5.7 % — ABNORMAL HIGH (ref <5.7)
MEAN PLASMA GLUCOSE: 117 mg/dL — AB (ref <117)

## 2014-01-14 LAB — MRSA PCR SCREENING: MRSA by PCR: NEGATIVE

## 2014-01-14 MED ORDER — ASPIRIN 325 MG PO TABS
325.0000 mg | ORAL_TABLET | Freq: Every day | ORAL | Status: DC
  Administered 2014-01-14 – 2014-01-15 (×2): 325 mg via ORAL
  Filled 2014-01-14 (×3): qty 1

## 2014-01-14 MED ORDER — METOPROLOL TARTRATE 25 MG PO TABS
25.0000 mg | ORAL_TABLET | Freq: Two times a day (BID) | ORAL | Status: DC
  Administered 2014-01-14 – 2014-01-15 (×3): 25 mg via ORAL
  Filled 2014-01-14 (×5): qty 1

## 2014-01-14 MED ORDER — ALPRAZOLAM 0.5 MG PO TABS
0.5000 mg | ORAL_TABLET | Freq: Four times a day (QID) | ORAL | Status: DC | PRN
Start: 2014-01-14 — End: 2014-01-15
  Administered 2014-01-14 – 2014-01-15 (×2): 0.5 mg via ORAL
  Filled 2014-01-14 (×2): qty 1

## 2014-01-14 MED ORDER — HYDROCHLOROTHIAZIDE 12.5 MG PO CAPS
12.5000 mg | ORAL_CAPSULE | Freq: Every day | ORAL | Status: DC
  Administered 2014-01-14 – 2014-01-15 (×2): 12.5 mg via ORAL
  Filled 2014-01-14 (×3): qty 1

## 2014-01-14 MED ORDER — GADOBENATE DIMEGLUMINE 529 MG/ML IV SOLN
20.0000 mL | Freq: Once | INTRAVENOUS | Status: AC | PRN
  Administered 2014-01-14: 20 mL via INTRAVENOUS

## 2014-01-14 MED ORDER — PANTOPRAZOLE SODIUM 40 MG PO TBEC
40.0000 mg | DELAYED_RELEASE_TABLET | Freq: Every day | ORAL | Status: DC
  Administered 2014-01-14 – 2014-01-15 (×2): 40 mg via ORAL
  Filled 2014-01-14 (×2): qty 1

## 2014-01-14 MED ORDER — DILTIAZEM HCL ER COATED BEADS 180 MG PO CP24
180.0000 mg | ORAL_CAPSULE | Freq: Every day | ORAL | Status: DC
  Administered 2014-01-14 – 2014-01-15 (×2): 180 mg via ORAL
  Filled 2014-01-14 (×2): qty 1

## 2014-01-14 MED ORDER — LOSARTAN POTASSIUM 50 MG PO TABS
50.0000 mg | ORAL_TABLET | Freq: Every day | ORAL | Status: DC
  Administered 2014-01-14 – 2014-01-15 (×2): 50 mg via ORAL
  Filled 2014-01-14 (×3): qty 1

## 2014-01-14 NOTE — Progress Notes (Signed)
Pt remains concerned about receiving contrast in CTA due to having been on dialysis briefly two years ago following cardiothoracic surgery. Dr. Leonie Man notified. Pt reassured that we will not do any procedures until he feels comfortable about it.

## 2014-01-14 NOTE — Plan of Care (Signed)
Problem: tPA Day Progression Outcomes-Only if tPA administered Goal: Pre tPA NIH Stroke Scale documented Outcome: Completed/Met Date Met:  01/14/14 Goal: Pre tPA BP < 185/110 - see orders for mgmt Outcome: Completed/Met Date Met:  01/14/14 Goal: Pre tPA two IV sites established Outcome: Completed/Met Date Met:  01/14/14 Goal: Pre tPA foley catheter inserted Outcome: Not Applicable Date Met:  23/53/61 Goal: Pre tPA panda/NGT inserted if need anticipated Outcome: Not Applicable Date Met:  44/31/54

## 2014-01-14 NOTE — Progress Notes (Signed)
  Echocardiogram 2D Echocardiogram has been performed.  Gabriel Tran 01/14/2014, 11:30 AM

## 2014-01-14 NOTE — Progress Notes (Signed)
STROKE TEAM PROGRESS NOTE   HISTORY Gabriel Tran is a 58 year old man with PMH of hypertension, hx of PE, hx of aortic dissection s/p repair,  hyperglycemia but not diagnosed with diabetes, renal insufficiency who presents with sudden onset right hand weakness, right facial droop, and visual deficits that started suddenly at 7:25 PM. He called 911 and EMS transported him and activated a code stroke en route. CT was negative and there were no contraindications to TPA and therefore the decision to give TPA was made after discussing risks and benefits with him and his wife. He was hypertensive and despite treatment remained hypertensive. Cardene drip was started and his blood pressure lowered allowing for TPA to be administered.   Patient was administered TPA at 9:55PM, 45 minutes from door to needle . He was admitted to the neuro ICU on 12/9 for further evaluation and treatment.   SUBJECTIVE (INTERVAL HISTORY) His family is at the bedside.  Overall he feels his condition is gradually improving. He remains with slurred speech, mild right hand weakness and facial droop with no apparent right leg weakness. He reports a history of acute kidney injury and is very concerned about receiving IV dye/contrast.    OBJECTIVE Temp:  [98 F (36.7 C)-98.8 F (37.1 C)] 98 F (36.7 C) (12/10 0813) Pulse Rate:  [57-97] 77 (12/10 0800) Cardiac Rhythm:  [-]  Resp:  [11-30] 16 (12/10 0800) BP: (125-200)/(49-115) 148/81 mmHg (12/10 0800) SpO2:  [92 %-100 %] 92 % (12/10 0800) Weight:  [215 lb (97.523 kg)-220 lb 14.4 oz (100.2 kg)] 220 lb 14.4 oz (100.2 kg) (12/09 2230)   Recent Labs Lab 01/13/14 2124  GLUCAP 116*    Recent Labs Lab 01/13/14 2110 01/13/14 2119 01/14/14 0249  NA 130* 135* 136*  K 3.3* 3.3* 4.0  CL 92* 94* 98  CO2 23  --  26  GLUCOSE 118* 122* 109*  BUN 16 20 16   CREATININE 1.10 1.20 1.07  CALCIUM 9.7  --  9.6    Recent Labs Lab 01/13/14 2110  AST 20  ALT 18  ALKPHOS 62   BILITOT 0.4  PROT 7.4  ALBUMIN 3.7    Recent Labs Lab 01/13/14 2110 01/13/14 2119  WBC 8.8  --   NEUTROABS 5.1  --   HGB 14.5 15.6  HCT 41.3 46.0  MCV 87.3  --   PLT 177  --     Recent Labs  01/13/14 2110  LABPROT 12.9  INR 0.96        Component Value Date/Time   CHOL 226* 01/14/2014 0249   TRIG 108 01/14/2014 0249   HDL 56 01/14/2014 0249   CHOLHDL 4.0 01/14/2014 0249   VLDL 22 01/14/2014 0249   LDLCALC 148* 01/14/2014 0249   Lab Results  Component Value Date   HGBA1C 5.9* 10/29/2011      Component Value Date/Time   LABOPIA NONE DETECTED 10/29/2011 2235   COCAINSCRNUR NONE DETECTED 10/29/2011 2235   LABBENZ NONE DETECTED 10/29/2011 2235   AMPHETMU NONE DETECTED 10/29/2011 2235   THCU NONE DETECTED 10/29/2011 2235   LABBARB POSITIVE* 10/29/2011 2235    No results for input(s): ETH in the last 168 hours.  Ct Head (brain) Wo Contrast  01/13/2014   CLINICAL DATA:  Right-sided weakness.  Stroke symptoms  EXAM: CT HEAD WITHOUT CONTRAST  TECHNIQUE: Contiguous axial images were obtained from the base of the skull through the vertex without intravenous contrast.  COMPARISON:  10/29/2011  FINDINGS: Mild ventricular dilatation  may indicate early central atrophy or normal variation. This is unchanged since previous study. Sulci appear symmetrical. No mass effect or midline shift. No abnormal extra-axial fluid collections. Gray-white matter junctions are distinct. Basal cisterns are not effaced. No evidence of acute intracranial hemorrhage. No depressed skull fractures. Visualized paranasal sinuses and mastoid air cells are not opacified.  IMPRESSION: No acute intracranial abnormalities. Unchanged appearance of symmetrical prominence of lateral ventricles.   Electronically Signed   By: Lucienne Capers M.D.   On: 01/13/2014 21:24     PHYSICAL EXAM Alert, conversant with slurred speech. Oriented to place, time, person, and situation. Diplopia with central gaze. Saccadic  dysmetria with horizontal gaze. Right eye with impaired vertical gaze. Weakness of right eye lid closure. Unable to frown right forehead. Right facial droop. Strength of right hand diminished compared to the right. Fine motor skills of right hand diminished compared to the left. LE with normal strength bilaterally. Finger-to-nose test normal with left UE, slow with the right UE. Normal tone of UE and LE. Gait deferred  ASSESSMENT/PLAN [name] is a 57 y.o. man with PMH of HTN, hx of PE, hx of aortic dissection s/p repair, hyperglycemia (no DM diagnosis), CKD, presenting with acute right hand weakness, facial droop, and visual defect with CT head unremarkable for acute CVA. He did receive IV t-PA at 21:55 PM on 12/9 due to deficits and no contract indication to TPA.   Stroke:  CT head has not demonstrated acute infarct or bleed but physical exam is concerning for left brain stem lesion likely secondary to small vessel disease source due to his HTN.  Resultant  Right hand weakness, right facial weakness of upper and lower motor function, visual defect  MRI  Ordered  MRA  Neck with and Wo Contrast ordered    2D Echo  EF 60-65% with no regional wall abnormality, mild AV regurgitation, mild LA and RA dilation  CT head wo contrast: No acute intracranial abnormalities. Unchanged appearance of symmetrical prominence of lateral ventricles.   LDL 148  HgbA1c 5.7%  SCDs for VTE prophylaxis  Diet regular thin liquids  aspirin 81 mg orally every day prior to admission, will await 48hrs post TPA  Ongoing aggressive stroke risk factor management  Therapy recommendations:  Given his hx of aortic dissection, ordered MRA neck w and wo contrast for better evaluation of vessels as possible cause of CVA.   Disposition: Pending  Hypertension  Home meds:   Diltiazem 180mg  24 hr, HCTZ 12.5mg , Losartan 50mg  daily, metoprolol 25mg  BID  Unstable  Patient counseled to be compliant with his  blood pressure medications  Hyperlipidemia  Home meds:  None.   LDL 148 , goal < 70  Add  Lipitor 40mg  daily.   Continue statin at discharge  Diabetes, no hx of  HgbA1c of 5.7%, goal < 7.0  Other Stroke Risk Factors  Former Cigarette smoker, quit in '92  ETOH use, unknown frequency/quantity  Obesity, Body mass index is 34.59 kg/(m^2).   Aortic dissection type A, s/p repair  Other Active Problems  Uncontrolled HTN  Other Pertinent History  Hx of Aortic dissection type A s/p repair  Hospital day # 1 I have personally examined this patient, reviewed notes, independently viewed imaging studies, participated in medical decision making and plan of care. I have made any additions or clarifications directly to the above note. Agree with note above. The patient has had a left brain subcortical infarct and received TPA. And obtained modest  recovery. I had a long discussion with the patient, his wife and brother with regards to need to look at his blood vessels with contrast study. He has concerns about hurting his kidneys because of the dye but his current serum creatinine is 1.07 and he had an acute renal failure in the past which was postoperative and he has recovered from that. I assured him that the risk-benefit of using the dye was in favor of doing a contrast angiogram as it may provide information which will impact his treatment course  This patient is critically ill and at significant risk of neurological worsening, death and care requires constant monitoring of vital signs, hemodynamics,respiratory and cardiac monitoring,review of multiple databases, neurological assessment, discussion with family, other specialists and medical decision making of high complexity.I have made any additions or clarifications directly to the above note.  I spent 40 minutes of neurocritical care time  in the care of  this patient.  Antony Contras, MD Medical Director [facility] Pager:  [phone] 01/14/2014 3:49 PM   To contact Stroke Continuity provider, please refer to [email or web address] After hours, contact General Neurology

## 2014-01-14 NOTE — Progress Notes (Signed)
PT Cancellation Note  Patient Details Name: Gabriel Tran MRN: [id number] DOB: 26-Feb-1955   Cancelled Treatment:    Reason Eval/Treat Not Completed: Patient not medically ready.  Pt currently on bedrest post tpa.  Will f/u tomorrow.     Ziyad Dyar, Thornton Papas 01/14/2014, 7:26 AM

## 2014-01-14 NOTE — Plan of Care (Signed)
Problem: tPA Day Progression Outcomes-Only if tPA administered Goal: Post tPA no anticoagulants/antiplatelets 24 hrs Outcome: Completed/Met Date Met:  01/14/14 Goal: Post tPA VS, neuro checks Outcome: Completed/Met Date Met:  01/14/14 Goal: Post tPA keep BP </equal 180/105 - see orders for mgmt Outcome: Completed/Met Date Met:  01/14/14 Goal: Post tPA call MD if neuro decline - plan for STAT CT Outcome: Completed/Met Date Met:  01/14/14 Goal: Post tPA CT brain w/o contrast 24 hrs post tPA Outcome: Completed/Met Date Met:  01/14/14 Goal: Post tPA bedrest for 24 hours Outcome: Not Applicable Date Met:  70/17/79 Goal: Post tPA fall precautions Outcome: Completed/Met Date Met:  01/14/14 Goal: Post tPA monitor for S/S of bleeding Outcome: Completed/Met Date Met:  01/14/14 Goal: Post tPA no S/S of hemorrhage elsewhere Outcome: Completed/Met Date Met:  01/14/14 Goal: Post tPA neurologically at baseline or improved CRITERIA FOR NEUROLOGICALLY AT BASELINE OR IMPROVED: - NO S/S OF INC. ICP - AWAKE, ALERT, ORIENTED X3 - SPEECH CLEAR, APPROPRIATE - PERRL - EOMS, BLINK INTACT - FACE SYMMETRICAL - TONGUE/TRACH MIDLINE - GRIPS, PUSH/PULL EQUAL - NO PRONATOR DRIFT - DORSIPLANTAR FLEXION EQUAL - MOVES ALL EXTREMITIES - SENSATION INTACT - NO NUCHAL RIGIDITY OR PHOTOPHOBIA  Outcome: Completed/Met Date Met:  01/14/14 Goal: Post tPA neuro decline/bleeding complications guide BLEEDING COMPLICATION GUIDE: - STOP TPA - NOTIFY MD ASAP - IF COMPRESSIBLE, HOLD DIRECT PRESSURE - IF NONCOMPRESSIBLE OR UNCONTROLLED BLEEDS, ANTICIPATE MD MAY ORDER STAT:  --- 5 UNITS CRYOPRECIPITATE,  --- 2-3 UNITS FFP IF NEEDED,  --- PLATELET TRANSFUSION IF NEEDED,  --- PRBCS IF NEEDED  Outcome: Not Applicable Date Met:  39/03/00 Goal: Other tPA Day Goals/Outcomes Outcome: Completed/Met Date Met:  01/14/14

## 2014-01-14 NOTE — Plan of Care (Signed)
Problem: tPA Day Progression Outcomes-Only if tPA administered Goal: Inclusion criteria for tPA STANDARD: < 3hours from symptoms onset: - Diagnosis of ischemic stroke causing measureable neurological deficit - Neurological signs shold not be minor & isolated - Symptoms of stroke should not be sugestive of SAH - No head trauma or prior stroke in previous 3 months - No gastrointestinal or urinary tract hemorrhage in previous 21 days - No arterial puncture at a noncompressible site in previous 7 days - BP not elevated [systolic <423 mmHg, diastolic < 536 mmHg] - Not taking oral anticoagulant or if being taken INR < or equal 1.7 - Platelet count > or equal 100,000 mm3 - No seizure w/postictal residual neurological impairments - Pt/family understand potential risks/benefits from treatment - Caution in pts with NIHSS > 22 - Seizure at stroke onset eligible for tPA if residual impairments due to stroke and not the seizures. - Neurological signs should not be clearing spontaneously - Exercise caution in treating pt with major deficits - Symptoms onset < 3hrs before beginning treatment - No MI in previous 3 months - No major surgery in previous 14 days - No history previous intracranial hemorrhage - No evidence active bleeding/acute trauma (fracture) on examinations - If receiving heparin in previous 48 hrs, aPTT must be normal range - Abnormal Blood Glucose < 50 or > 400 mg/dL - CT does not show multilobar infarction [hypodensity >1/3 cerebral hemisphere] EXTENDED: 3-4.5 hrous from symptom onset - Age > 80 years - History of prior stroke and diabetes - Any anticoagulant use prior to admission (even if INR < 1.7) - NIHSS > 25" Outcome: Completed/Met Date Met:  01/14/14

## 2014-01-14 NOTE — Progress Notes (Signed)
OT Cancellation Note  Patient Details Name: Gabriel Tran MRN: [id number] DOB: [birth year]   Cancelled Treatment:    Reason Eval/Treat Not Completed: Patient not medically ready (bedrest - s/p TPA)  Peri Maris  [phone] 01/14/2014, 7:13 AM

## 2014-01-14 NOTE — Progress Notes (Signed)
UR completed.  Edina Winningham, RN BSN [facility] CCM Trauma/Neuro ICU Case Manager [phone]  

## 2014-01-15 ENCOUNTER — Inpatient Hospital Stay (HOSPITAL_COMMUNITY): Payer: 59

## 2014-01-15 DIAGNOSIS — I63032 Cerebral infarction due to thrombosis of left carotid artery: Secondary | ICD-10-CM

## 2014-01-15 MED ORDER — ROSUVASTATIN CALCIUM 20 MG PO TABS
20.0000 mg | ORAL_TABLET | Freq: Every day | ORAL | Status: DC
  Administered 2014-01-15: 20 mg via ORAL
  Filled 2014-01-15: qty 1

## 2014-01-15 MED ORDER — ROSUVASTATIN CALCIUM 20 MG PO TABS: 20.0000 mg | ORAL_TABLET | Freq: Every day | ORAL | Status: DC

## 2014-01-15 MED ORDER — CLOPIDOGREL BISULFATE 75 MG PO TABS: 75.0000 mg | ORAL_TABLET | Freq: Every day | ORAL | Status: DC

## 2014-01-15 MED ORDER — CLOPIDOGREL BISULFATE 75 MG PO TABS
75.0000 mg | ORAL_TABLET | Freq: Every day | ORAL | Status: DC
  Administered 2014-01-15: 75 mg via ORAL
  Filled 2014-01-15: qty 1

## 2014-01-15 NOTE — Progress Notes (Signed)
MD call RN for patient to normal diet. Procedures schedule as outpatient. Patient to be discharged home later this evening. Tomma Rakers RN

## 2014-01-15 NOTE — Progress Notes (Signed)
Patient arrived to 4N04 AAOx4 and pleasant. Vital signs were taken, tele placed and call bell by his side. Medications from previous floor were placed in the med room. SCD's on. Questions were answered and will continue to monitor. Chryl Holten, Rande Brunt

## 2014-01-15 NOTE — Progress Notes (Signed)
Pt transferred to 4N room 4 with no incident. Receiving RN is Judson Roch.

## 2014-01-15 NOTE — Consult Note (Signed)
ELECTROPHYSIOLOGY CONSULT NOTE  Patient ID: Gabriel Tran MRN: [id number], DOB/AGE: Feb 13, 1955   Admit date: 01/13/2014 Date of Consult: 01/15/2014  Primary Physician: Elsie Stain, MD Primary Cardiologist: Mare Ferrari Reason for Consultation: Cryptogenic stroke; recommendations regarding Implantable Loop Recorder  History of Present Illness Gabriel Tran was admitted on 01/13/2014 with right hand weakness and right facial droop.  Imaging demonstrated left posterior frontal lobe infarct.  He underwent tPA treatment.  He has undergone workup for stroke including echocardiogram and carotid dopplers.  The patient has been monitored on telemetry which has demonstrated sinus rhythm with PVC's.  Inpatient stroke work-up is to be completed with a TEE.   He had atrial fibrillation briefly in the post op period following aortic dissection repair but has had none since. He does have palpitations at home but these are described as PVC's.    Echocardiogram this admission demonstrated EF 60-65%, no RWMA, LA 47.  Lab work is reviewed.  Prior to admission, the patient denies chest pain, shortness of breath, dizziness, or syncope.  They are recovering from their stroke with plans to return home at discharge.  EP has been asked to evaluate for placement of an implantable loop recorder to monitor for atrial fibrillation.  ROS is negative except as outlined above.    Past Medical History  Diagnosis Date  . Hypertension   . Hyperglycemia     a. A1C 5.9 in Sept 2013.  Marland Kitchen RBBB   . Renal insufficiency 08/28/2012     Surgical History:  Past Surgical History  Procedure Laterality Date  . Knee surgery      LEFT 1981  . Carpal tunnel release      2004 RIGHT  . Wisdom tooth extraction    . Thoracic aortic aneurysm repair  10/30/2011    Procedure: THORACIC ASCENDING ANEURYSM REPAIR (AAA);  Surgeon: Ivin Poot, MD;  Location: [place];  Service: Open Heart Surgery;  Laterality: N/A;  Ascending  aortic dissection repair, arch reconstruction, aortic valve repair  . Insertion of dialysis catheter  11/06/2011    Procedure: INSERTION OF DIALYSIS CATHETER;  Surgeon: Ivin Poot, MD;  Location: [place];  Service: Thoracic;  Laterality: Right;  . Mediastinal exploration  11/06/2011    Procedure: MEDIASTINAL EXPLORATION;  Surgeon: Ivin Poot, MD;  Location: [place];  Service: Thoracic;  Laterality: N/A;  sternal closure; removal of wound vac  . Insertion of dialysis catheter  11/13/2011    Procedure: INSERTION OF DIALYSIS CATHETER;  Surgeon: Elam Dutch, MD;  Location: [place];  Service: Vascular;  Laterality: N/A;  Ultrasound guided.  . Cardiac valve replacement  10/2011    Aortic valve repaired  . Left heart catheterization with coronary angiogram N/A 10/30/2011    Procedure: LEFT HEART CATHETERIZATION WITH CORONARY ANGIOGRAM;  Surgeon: Burnell Blanks, MD;  Location: [facility] CATH LAB;  Service: Cardiovascular;  Laterality: N/A;     Prescriptions prior to admission  Medication Sig Dispense Refill Last Dose  . ALPRAZolam (XANAX) 0.5 MG tablet take 1 tablet by mouth every 6 hours if needed for anxiety 30 tablet 0 Past Week at Unknown time  . aspirin 81 MG tablet Take 81 mg by mouth daily.   01/13/2014 at 1000  . augmented betamethasone dipropionate (DIPROLENE) 0.05 % ointment Apply 1 application topically daily as needed (Xerosis).   01/12/2014 at Unknown time  . diltiazem (CARDIZEM CD) 180 MG 24 hr capsule Take 1 capsule (180 mg total) by mouth daily. [place]  capsule 3 01/12/2014 at Unknown time  . hydrochlorothiazide (HYDRODIURIL) 12.5 MG tablet Take 1 tablet (12.5 mg total) by mouth daily. 90 tablet 3 01/13/2014 at 1000  . losartan (COZAAR) 50 MG tablet Take 1 tablet (50 mg total) by mouth daily. 90 tablet 3 01/13/2014 at 1930  . metoprolol tartrate (LOPRESSOR) 25 MG tablet Take 1 tablet (25 mg total) by mouth 2 (two) times daily. 180 tablet 3 01/13/2014 at 1000  . Multiple Vitamin (MULTIVITAMIN  WITH MINERALS) TABS tablet Take 1 tablet by mouth daily.   01/13/2014 at Unknown time  . potassium chloride (K-DUR) 10 MEQ tablet take 1 tablet by mouth once daily 90 tablet 3 01/13/2014 at 1930    Inpatient Medications:  . aspirin  325 mg Oral Daily  . diltiazem  180 mg Oral Daily  . hydrochlorothiazide  12.5 mg Oral Daily  . losartan  50 mg Oral Daily  . metoprolol tartrate  25 mg Oral BID  . pantoprazole  40 mg Oral Q1200    Allergies:  Allergies  Allergen Reactions  . Lipitor [Atorvastatin]     aches    History   Social History  . Marital Status: Married    Spouse Name: N/A    Number of Children: N/A  . Years of Education: N/A   Occupational History  . Not on file.   Social History Main Topics  . Smoking status: Former Smoker    Types: Cigarettes    Quit date: 02/05/1990  . Smokeless tobacco: Not on file     Comment: Smoked for 20 years.  . Alcohol Use: 0.0 oz/week     Comment: 6 beers a week on average  . Drug Use: No  . Sexual Activity: Not on file   Other Topics Concern  . Not on file   Social History Narrative   Works at [employer], some overseas travel, some [employer] travel   Married 2000, 2 adult step kids      Family History  Problem Relation Age of Onset  . Parkinsonism Mother   . Leukemia Father     AML at 88  . Colon polyps Brother   . Kidney disease Brother   . Heart disease Brother     CABG at age 58.  . Prostate cancer Neg Hx   . Colon cancer Neg Hx   . Heart disease Father     Angioplasty in his 61s, CABG age 66.     Physical Exam: Filed Vitals:   01/15/14 0200 01/15/14 0314 01/15/14 0951 01/15/14 1407  BP: 143/85 153/66 155/66 162/67  Pulse: 67 65 70 70  Temp:  98.2 F (36.8 C) 98.2 F (36.8 C) 97.5 F (36.4 C)  TempSrc:  Oral Oral Oral  Resp: 15 16 16 16   Height:      Weight:      SpO2: 96% 97% 98% 99%    GEN- The patient is well appearing, alert and oriented x 3 today.   Head- normocephalic, atraumatic Eyes-  Sclera  clear, conjunctiva pink Ears- hearing intact Oropharynx- clear Neck- supple, Lungs- Clear to ausculation bilaterally, normal work of breathing Heart- Regular rate and rhythm, no murmurs, rubs or gallops, PMI not laterally displaced GI- soft, NT, ND, + BS Extremities- no clubbing, cyanosis, or edema MS- no significant deformity or atrophy Skin- no rash or lesion Psych- euthymic mood, full affect   Labs:   Lab Results  Component Value Date   WBC 8.8 01/13/2014   HGB 15.6 01/13/2014  HCT 46.0 01/13/2014   MCV 87.3 01/13/2014   PLT 177 01/13/2014    Recent Labs Lab 01/13/14 2110  01/14/14 0249  NA 130*  < > 136*  K 3.3*  < > 4.0  CL 92*  < > 98  CO2 23  --  26  BUN 16  < > 16  CREATININE 1.10  < > 1.07  CALCIUM 9.7  --  9.6  PROT 7.4  --   --   BILITOT 0.4  --   --   ALKPHOS 62  --   --   ALT 18  --   --   AST 20  --   --   GLUCOSE 118*  < > 109*  < > = values in this interval not displayed.   Radiology/Studies: Dg Chest 2 View 01/15/2014   CLINICAL DATA:  58 year old male status post left MCA stroke yesterday. Right hand weakness. Initial encounter.  EXAM: CHEST  2 VIEW  COMPARISON:  01/16/2012.  FINDINGS: Stable to improved lung volumes. Stable cardiomegaly and mediastinal contours. Sequelae of median sternotomy. Visualized tracheal air column is within normal limits. Right axillary surgical clips are stable. No pneumothorax, pulmonary edema, pleural effusion or confluent pulmonary opacity. Mild tortuosity of the thoracic aorta. No acute osseous abnormality identified. Chronic T12 compression fracture.  IMPRESSION: No acute cardiopulmonary abnormality.   Electronically Signed   By: Lars Pinks M.D.   On: 01/15/2014 09:14   Ct Head (brain) Wo Contrast 01/13/2014   CLINICAL DATA:  Right-sided weakness.  Stroke symptoms  EXAM: CT HEAD WITHOUT CONTRAST  TECHNIQUE: Contiguous axial images were obtained from the base of the skull through the vertex without intravenous contrast.   COMPARISON:  10/29/2011  FINDINGS: Mild ventricular dilatation may indicate early central atrophy or normal variation. This is unchanged since previous study. Sulci appear symmetrical. No mass effect or midline shift. No abnormal extra-axial fluid collections. Gray-white matter junctions are distinct. Basal cisterns are not effaced. No evidence of acute intracranial hemorrhage. No depressed skull fractures. Visualized paranasal sinuses and mastoid air cells are not opacified.  IMPRESSION: No acute intracranial abnormalities. Unchanged appearance of symmetrical prominence of lateral ventricles.   Electronically Signed   By: Lucienne Capers M.D.   On: 01/13/2014 21:24   Mr Jodene Nam Head Wo Contrast 01/14/2014   CLINICAL DATA:  58 year old hypertensive male with history of aortic dissection post repair. Presents with sudden onset of right hand weakness, right facial droop and visual changes. Post tPA. Subsequent encounter.  EXAM: MRI HEAD WITHOUT AND WITH CONTRAST AND MRA HEAD WITHOUT CONTRAST AND MRI NECK WITHOUT AND WITH CONTRAST  TECHNIQUE: Multiplanar, multiecho pulse sequences of the brain and surrounding structures were obtained without and with intravenous contrast. Angiographic images of the head were obtained using MRA technique without contrast. Multiplanar, multiecho pulse sequences of the neck and surrounding structures were obtained without and with intravenous contrast.  CONTRAST:  15mL MULTIHANCE GADOBENATE DIMEGLUMINE 529 MG/ML IV SOLN  COMPARISON:  01/13/2014 head CT.  No comparison brain MR.  FINDINGS: MRI HEAD FINDINGS  Acute small to slightly moderate size nonhemorrhagic posterior left frontal lobe infarct.  Tiny punctate areas of blood breakdown products cerebellum bilaterally, temporal lobe bilaterally, left posterior -parietal frontal lobe and right caudate body may represent result of prior hemorrhagic ischemia or remote trauma.  Remote very small left anterior corona radiata/centrum semiovale  infarct. Remote very small left periatrial infarct. Mild white matter type changes suggestive of result of small vessel disease.  Minimal amount of enhancement along the inferior aspect of the acute left frontal lobe infarct otherwise no intracranial enhancing lesion or mass is identified.  No hydrocephalus.  Minimal atrophy.  Cervical medullary junction, pituitary region, pineal region and orbital structures unremarkable.  MRA HEAD FINDINGS  Moderate middle cerebral artery branch vessel narrowing and irregularity bilaterally. No significant stenosis of either carotid terminus or M1 segment of the middle cerebral artery.  Mild irregularity and slight narrowing cavernous and supraclinoid segment of the internal carotid arteries bilaterally.  No significant stenosis distal vertebral arteries. Right vertebral artery is slightly dominant with mild narrowing distal left vertebral artery.  Moderate narrowing posterior inferior cerebellar artery branch vessels bilaterally.  Nonvisualized anterior inferior cerebellar arteries.  Minimal distal posterior cerebral artery branch vessel irregularity.  Mild irregularity right superior cerebellar artery.  No aneurysm noted.  MRI NECK FINDINGS  Prominent ectasia of the carotid arteries bilaterally including the common carotid arteries and internal carotid arteries with ectasia more notable on the left. No evidence the hemodynamically significant stenosis involving either carotid bifurcation.  Mildly ectatic vertebral arteries with right vertebral artery slightly dominant in size. Very mild narrowing proximal left vertebral artery.  No significant stenosis of either subclavian artery.  IMPRESSION: MRI HEAD  Acute small to slightly moderate size nonhemorrhagic posterior left frontal lobe infarct.  Tiny punctate areas of blood breakdown products cerebellum bilaterally, temporal lobe bilaterally, left posterior -parietal frontal lobe and right caudate body may represent result of prior  hemorrhagic ischemia or remote trauma.  Remote very small left anterior corona radiata/centrum semiovale infarct. Remote very small left periatrial infarct. Mild white matter type changes suggestive of result of small vessel disease.  Minimal amount of enhancement along the inferior aspect of the acute left frontal lobe infarct otherwise no intracranial enhancing lesion or mass is identified.  MRA HEAD  Moderate middle cerebral artery branch vessel narrowing and irregularity bilaterally. No significant stenosis of either carotid terminus or M1 segment of the middle cerebral artery.  Mild irregularity and slight narrowing cavernous and supraclinoid segment of the internal carotid arteries bilaterally.  No significant stenosis distal vertebral arteries. Right vertebral artery is slightly dominant with mild narrowing distal left vertebral artery.  Moderate narrowing posterior inferior cerebellar artery branch vessels bilaterally.  Nonvisualized anterior inferior cerebellar arteries.  MRI NECK  Prominent ectasia of the carotid arteries bilaterally including the common carotid arteries and internal carotid arteries with ectasia more notable on the left.  No evidence the hemodynamically significant stenosis involving either carotid bifurcation.  Mildly ectatic vertebral arteries with right vertebral artery slightly dominant in size. Very mild narrowing proximal left vertebral artery.   Electronically Signed   By: Chauncey Cruel M.D.   On: 01/14/2014 20:15   12-lead ECG sinus rhythm, rate 75, RBBB  Telemetry sinus rhythm with PVC's  Assessment and Plan: 1. Cryptogenic stroke 2. H/o aortic dissection repair 3. HTN 4. RBBB  Rec: will plan to proceed with TEE/LINQ scheduled for Wednesday 01-20-14 at [facility].  Pt should arrive at short stay at [facility] after midnight 01-19-14. [place] for discharge from my perspective.  Mikle Bosworth.D.

## 2014-01-15 NOTE — Progress Notes (Signed)
STROKE TEAM PROGRESS NOTE   HISTORY Gabriel Tran is a 58 year old man with PMH of hypertension, hx of PE, hx of aortic dissection s/p repair,  hyperglycemia but not diagnosed with diabetes, renal insufficiency who presents with sudden onset right hand weakness, right facial droop, and visual deficits that started suddenly at 7:25 PM. He called 911 and EMS transported him and activated a code stroke en route. CT was negative and there were no contraindications to TPA and therefore the decision to give TPA was made after discussing risks and benefits with him and his wife. He was hypertensive and despite treatment remained hypertensive. Cardene drip was started and his blood pressure lowered allowing for TPA to be administered.   Patient was administered TPA at 9:55PM, 45 minutes from door to needle . He was admitted to the neuro ICU on 12/9 for further evaluation and treatment.   SUBJECTIVE (INTERVAL HISTORY) His wife is at the bedside.  Overall he feels his condition continues to gradually improve. He is out in the hallway walking. His slurred speech is still present but he is tolerating a diet well. His hand weakness is gradually improving though he still has fine motor coordination impairment.  He reports that he had post-op A. Fib in the past and intermittent heart palpitations recently.    OBJECTIVE Temp:  [98.2 F (36.8 C)-99 F (37.2 C)] 98.2 F (36.8 C) (12/11 0951) Pulse Rate:  [57-79] 70 (12/11 0951) Cardiac Rhythm:  [-] Normal sinus rhythm;Bundle branch block (12/11 0315) Resp:  [14-21] 16 (12/11 0951) BP: (123-171)/(48-95) 155/66 mmHg (12/11 0951) SpO2:  [92 %-100 %] 98 % (12/11 0951)   Recent Labs Lab 01/13/14 2124  GLUCAP 116*    Recent Labs Lab 01/13/14 2110 01/13/14 2119 01/14/14 0249  NA 130* 135* 136*  K 3.3* 3.3* 4.0  CL 92* 94* 98  CO2 23  --  26  GLUCOSE 118* 122* 109*  BUN 16 20 16   CREATININE 1.10 1.20 1.07  CALCIUM 9.7  --  9.6    Recent  Labs Lab 01/13/14 2110  AST 20  ALT 18  ALKPHOS 62  BILITOT 0.4  PROT 7.4  ALBUMIN 3.7    Recent Labs Lab 01/13/14 2110 01/13/14 2119  WBC 8.8  --   NEUTROABS 5.1  --   HGB 14.5 15.6  HCT 41.3 46.0  MCV 87.3  --   PLT 177  --     Recent Labs  01/13/14 2110  LABPROT 12.9  INR 0.96        Component Value Date/Time   CHOL 226* 01/14/2014 0249   TRIG 108 01/14/2014 0249   HDL 56 01/14/2014 0249   CHOLHDL 4.0 01/14/2014 0249   VLDL 22 01/14/2014 0249   LDLCALC 148* 01/14/2014 0249   Lab Results  Component Value Date   HGBA1C 5.7* 01/14/2014      Component Value Date/Time   LABOPIA NONE DETECTED 10/29/2011 2235   COCAINSCRNUR NONE DETECTED 10/29/2011 2235   LABBENZ NONE DETECTED 10/29/2011 2235   AMPHETMU NONE DETECTED 10/29/2011 2235   THCU NONE DETECTED 10/29/2011 2235   LABBARB POSITIVE* 10/29/2011 2235    No results for input(s): ETH in the last 168 hours.  Dg Chest 2 View  01/15/2014   CLINICAL DATA:  58 year old male status post left MCA stroke yesterday. Right hand weakness. Initial encounter.  EXAM: CHEST  2 VIEW  COMPARISON:  01/16/2012.  FINDINGS: Stable to improved lung volumes. Stable cardiomegaly and mediastinal contours.  Sequelae of median sternotomy. Visualized tracheal air column is within normal limits. Right axillary surgical clips are stable. No pneumothorax, pulmonary edema, pleural effusion or confluent pulmonary opacity. Mild tortuosity of the thoracic aorta. No acute osseous abnormality identified. Chronic T12 compression fracture.  IMPRESSION: No acute cardiopulmonary abnormality.   Electronically Signed   By: Lars Pinks M.D.   On: 01/15/2014 09:14   Ct Head (brain) Wo Contrast  01/13/2014   CLINICAL DATA:  Right-sided weakness.  Stroke symptoms  EXAM: CT HEAD WITHOUT CONTRAST  TECHNIQUE: Contiguous axial images were obtained from the base of the skull through the vertex without intravenous contrast.  COMPARISON:  10/29/2011  FINDINGS: Mild  ventricular dilatation may indicate early central atrophy or normal variation. This is unchanged since previous study. Sulci appear symmetrical. No mass effect or midline shift. No abnormal extra-axial fluid collections. Gray-white matter junctions are distinct. Basal cisterns are not effaced. No evidence of acute intracranial hemorrhage. No depressed skull fractures. Visualized paranasal sinuses and mastoid air cells are not opacified.  IMPRESSION: No acute intracranial abnormalities. Unchanged appearance of symmetrical prominence of lateral ventricles.   Electronically Signed   By: Lucienne Capers M.D.   On: 01/13/2014 21:24   Mr Jodene Nam Head Wo Contrast  01/14/2014   CLINICAL DATA:  58 year old hypertensive male with history of aortic dissection post repair. Presents with sudden onset of right hand weakness, right facial droop and visual changes. Post tPA. Subsequent encounter.  EXAM: MRI HEAD WITHOUT AND WITH CONTRAST AND MRA HEAD WITHOUT CONTRAST AND MRI NECK WITHOUT AND WITH CONTRAST  TECHNIQUE: Multiplanar, multiecho pulse sequences of the brain and surrounding structures were obtained without and with intravenous contrast. Angiographic images of the head were obtained using MRA technique without contrast. Multiplanar, multiecho pulse sequences of the neck and surrounding structures were obtained without and with intravenous contrast.  CONTRAST:  42mL MULTIHANCE GADOBENATE DIMEGLUMINE 529 MG/ML IV SOLN  COMPARISON:  01/13/2014 head CT.  No comparison brain MR.  FINDINGS: MRI HEAD FINDINGS  Acute small to slightly moderate size nonhemorrhagic posterior left frontal lobe infarct.  Tiny punctate areas of blood breakdown products cerebellum bilaterally, temporal lobe bilaterally, left posterior -parietal frontal lobe and right caudate body may represent result of prior hemorrhagic ischemia or remote trauma.  Remote very small left anterior corona radiata/centrum semiovale infarct. Remote very small left  periatrial infarct. Mild white matter type changes suggestive of result of small vessel disease.  Minimal amount of enhancement along the inferior aspect of the acute left frontal lobe infarct otherwise no intracranial enhancing lesion or mass is identified.  No hydrocephalus.  Minimal atrophy.  Cervical medullary junction, pituitary region, pineal region and orbital structures unremarkable.  MRA HEAD FINDINGS  Moderate middle cerebral artery branch vessel narrowing and irregularity bilaterally. No significant stenosis of either carotid terminus or M1 segment of the middle cerebral artery.  Mild irregularity and slight narrowing cavernous and supraclinoid segment of the internal carotid arteries bilaterally.  No significant stenosis distal vertebral arteries. Right vertebral artery is slightly dominant with mild narrowing distal left vertebral artery.  Moderate narrowing posterior inferior cerebellar artery branch vessels bilaterally.  Nonvisualized anterior inferior cerebellar arteries.  Minimal distal posterior cerebral artery branch vessel irregularity.  Mild irregularity right superior cerebellar artery.  No aneurysm noted.  MRI NECK FINDINGS  Prominent ectasia of the carotid arteries bilaterally including the common carotid arteries and internal carotid arteries with ectasia more notable on the left. No evidence the hemodynamically significant stenosis involving either carotid  bifurcation.  Mildly ectatic vertebral arteries with right vertebral artery slightly dominant in size. Very mild narrowing proximal left vertebral artery.  No significant stenosis of either subclavian artery.  IMPRESSION: MRI HEAD  Acute small to slightly moderate size nonhemorrhagic posterior left frontal lobe infarct.  Tiny punctate areas of blood breakdown products cerebellum bilaterally, temporal lobe bilaterally, left posterior -parietal frontal lobe and right caudate body may represent result of prior hemorrhagic ischemia or remote  trauma.  Remote very small left anterior corona radiata/centrum semiovale infarct. Remote very small left periatrial infarct. Mild white matter type changes suggestive of result of small vessel disease.  Minimal amount of enhancement along the inferior aspect of the acute left frontal lobe infarct otherwise no intracranial enhancing lesion or mass is identified.  MRA HEAD  Moderate middle cerebral artery branch vessel narrowing and irregularity bilaterally. No significant stenosis of either carotid terminus or M1 segment of the middle cerebral artery.  Mild irregularity and slight narrowing cavernous and supraclinoid segment of the internal carotid arteries bilaterally.  No significant stenosis distal vertebral arteries. Right vertebral artery is slightly dominant with mild narrowing distal left vertebral artery.  Moderate narrowing posterior inferior cerebellar artery branch vessels bilaterally.  Nonvisualized anterior inferior cerebellar arteries.  MRI NECK  Prominent ectasia of the carotid arteries bilaterally including the common carotid arteries and internal carotid arteries with ectasia more notable on the left.  No evidence the hemodynamically significant stenosis involving either carotid bifurcation.  Mildly ectatic vertebral arteries with right vertebral artery slightly dominant in size. Very mild narrowing proximal left vertebral artery.   Electronically Signed   By: Chauncey Cruel M.D.   On: 01/14/2014 20:15   Mr Angiogram Neck W Wo Contrast  01/14/2014   CLINICAL DATA:  58 year old hypertensive male with history of aortic dissection post repair. Presents with sudden onset of right hand weakness, right facial droop and visual changes. Post tPA. Subsequent encounter.  EXAM: MRI HEAD WITHOUT AND WITH CONTRAST AND MRA HEAD WITHOUT CONTRAST AND MRI NECK WITHOUT AND WITH CONTRAST  TECHNIQUE: Multiplanar, multiecho pulse sequences of the brain and surrounding structures were obtained without and with  intravenous contrast. Angiographic images of the head were obtained using MRA technique without contrast. Multiplanar, multiecho pulse sequences of the neck and surrounding structures were obtained without and with intravenous contrast.  CONTRAST:  48mL MULTIHANCE GADOBENATE DIMEGLUMINE 529 MG/ML IV SOLN  COMPARISON:  01/13/2014 head CT.  No comparison brain MR.  FINDINGS: MRI HEAD FINDINGS  Acute small to slightly moderate size nonhemorrhagic posterior left frontal lobe infarct.  Tiny punctate areas of blood breakdown products cerebellum bilaterally, temporal lobe bilaterally, left posterior -parietal frontal lobe and right caudate body may represent result of prior hemorrhagic ischemia or remote trauma.  Remote very small left anterior corona radiata/centrum semiovale infarct. Remote very small left periatrial infarct. Mild white matter type changes suggestive of result of small vessel disease.  Minimal amount of enhancement along the inferior aspect of the acute left frontal lobe infarct otherwise no intracranial enhancing lesion or mass is identified.  No hydrocephalus.  Minimal atrophy.  Cervical medullary junction, pituitary region, pineal region and orbital structures unremarkable.  MRA HEAD FINDINGS  Moderate middle cerebral artery branch vessel narrowing and irregularity bilaterally. No significant stenosis of either carotid terminus or M1 segment of the middle cerebral artery.  Mild irregularity and slight narrowing cavernous and supraclinoid segment of the internal carotid arteries bilaterally.  No significant stenosis distal vertebral arteries. Right vertebral artery is slightly  dominant with mild narrowing distal left vertebral artery.  Moderate narrowing posterior inferior cerebellar artery branch vessels bilaterally.  Nonvisualized anterior inferior cerebellar arteries.  Minimal distal posterior cerebral artery branch vessel irregularity.  Mild irregularity right superior cerebellar artery.  No  aneurysm noted.  MRI NECK FINDINGS  Prominent ectasia of the carotid arteries bilaterally including the common carotid arteries and internal carotid arteries with ectasia more notable on the left. No evidence the hemodynamically significant stenosis involving either carotid bifurcation.  Mildly ectatic vertebral arteries with right vertebral artery slightly dominant in size. Very mild narrowing proximal left vertebral artery.  No significant stenosis of either subclavian artery.  IMPRESSION: MRI HEAD  Acute small to slightly moderate size nonhemorrhagic posterior left frontal lobe infarct.  Tiny punctate areas of blood breakdown products cerebellum bilaterally, temporal lobe bilaterally, left posterior -parietal frontal lobe and right caudate body may represent result of prior hemorrhagic ischemia or remote trauma.  Remote very small left anterior corona radiata/centrum semiovale infarct. Remote very small left periatrial infarct. Mild white matter type changes suggestive of result of small vessel disease.  Minimal amount of enhancement along the inferior aspect of the acute left frontal lobe infarct otherwise no intracranial enhancing lesion or mass is identified.  MRA HEAD  Moderate middle cerebral artery branch vessel narrowing and irregularity bilaterally. No significant stenosis of either carotid terminus or M1 segment of the middle cerebral artery.  Mild irregularity and slight narrowing cavernous and supraclinoid segment of the internal carotid arteries bilaterally.  No significant stenosis distal vertebral arteries. Right vertebral artery is slightly dominant with mild narrowing distal left vertebral artery.  Moderate narrowing posterior inferior cerebellar artery branch vessels bilaterally.  Nonvisualized anterior inferior cerebellar arteries.  MRI NECK  Prominent ectasia of the carotid arteries bilaterally including the common carotid arteries and internal carotid arteries with ectasia more notable on the  left.  No evidence the hemodynamically significant stenosis involving either carotid bifurcation.  Mildly ectatic vertebral arteries with right vertebral artery slightly dominant in size. Very mild narrowing proximal left vertebral artery.   Electronically Signed   By: Chauncey Cruel M.D.   On: 01/14/2014 20:15   Mr Jeri Cos YI Contrast  01/14/2014   CLINICAL DATA:  58 year old hypertensive male with history of aortic dissection post repair. Presents with sudden onset of right hand weakness, right facial droop and visual changes. Post tPA. Subsequent encounter.  EXAM: MRI HEAD WITHOUT AND WITH CONTRAST AND MRA HEAD WITHOUT CONTRAST AND MRI NECK WITHOUT AND WITH CONTRAST  TECHNIQUE: Multiplanar, multiecho pulse sequences of the brain and surrounding structures were obtained without and with intravenous contrast. Angiographic images of the head were obtained using MRA technique without contrast. Multiplanar, multiecho pulse sequences of the neck and surrounding structures were obtained without and with intravenous contrast.  CONTRAST:  11mL MULTIHANCE GADOBENATE DIMEGLUMINE 529 MG/ML IV SOLN  COMPARISON:  01/13/2014 head CT.  No comparison brain MR.  FINDINGS: MRI HEAD FINDINGS  Acute small to slightly moderate size nonhemorrhagic posterior left frontal lobe infarct.  Tiny punctate areas of blood breakdown products cerebellum bilaterally, temporal lobe bilaterally, left posterior -parietal frontal lobe and right caudate body may represent result of prior hemorrhagic ischemia or remote trauma.  Remote very small left anterior corona radiata/centrum semiovale infarct. Remote very small left periatrial infarct. Mild white matter type changes suggestive of result of small vessel disease.  Minimal amount of enhancement along the inferior aspect of the acute left frontal lobe infarct otherwise no intracranial enhancing lesion  or mass is identified.  No hydrocephalus.  Minimal atrophy.  Cervical medullary junction,  pituitary region, pineal region and orbital structures unremarkable.  MRA HEAD FINDINGS  Moderate middle cerebral artery branch vessel narrowing and irregularity bilaterally. No significant stenosis of either carotid terminus or M1 segment of the middle cerebral artery.  Mild irregularity and slight narrowing cavernous and supraclinoid segment of the internal carotid arteries bilaterally.  No significant stenosis distal vertebral arteries. Right vertebral artery is slightly dominant with mild narrowing distal left vertebral artery.  Moderate narrowing posterior inferior cerebellar artery branch vessels bilaterally.  Nonvisualized anterior inferior cerebellar arteries.  Minimal distal posterior cerebral artery branch vessel irregularity.  Mild irregularity right superior cerebellar artery.  No aneurysm noted.  MRI NECK FINDINGS  Prominent ectasia of the carotid arteries bilaterally including the common carotid arteries and internal carotid arteries with ectasia more notable on the left. No evidence the hemodynamically significant stenosis involving either carotid bifurcation.  Mildly ectatic vertebral arteries with right vertebral artery slightly dominant in size. Very mild narrowing proximal left vertebral artery.  No significant stenosis of either subclavian artery.  IMPRESSION: MRI HEAD  Acute small to slightly moderate size nonhemorrhagic posterior left frontal lobe infarct.  Tiny punctate areas of blood breakdown products cerebellum bilaterally, temporal lobe bilaterally, left posterior -parietal frontal lobe and right caudate body may represent result of prior hemorrhagic ischemia or remote trauma.  Remote very small left anterior corona radiata/centrum semiovale infarct. Remote very small left periatrial infarct. Mild white matter type changes suggestive of result of small vessel disease.  Minimal amount of enhancement along the inferior aspect of the acute left frontal lobe infarct otherwise no intracranial  enhancing lesion or mass is identified.  MRA HEAD  Moderate middle cerebral artery branch vessel narrowing and irregularity bilaterally. No significant stenosis of either carotid terminus or M1 segment of the middle cerebral artery.  Mild irregularity and slight narrowing cavernous and supraclinoid segment of the internal carotid arteries bilaterally.  No significant stenosis distal vertebral arteries. Right vertebral artery is slightly dominant with mild narrowing distal left vertebral artery.  Moderate narrowing posterior inferior cerebellar artery branch vessels bilaterally.  Nonvisualized anterior inferior cerebellar arteries.  MRI NECK  Prominent ectasia of the carotid arteries bilaterally including the common carotid arteries and internal carotid arteries with ectasia more notable on the left.  No evidence the hemodynamically significant stenosis involving either carotid bifurcation.  Mildly ectatic vertebral arteries with right vertebral artery slightly dominant in size. Very mild narrowing proximal left vertebral artery.   Electronically Signed   By: Chauncey Cruel M.D.   On: 01/14/2014 20:15     PHYSICAL EXAM Obese middle aged Caucasian male not in distress.Awake alert. Afebrile. Head is nontraumatic. Neck is supple without bruit. Hearing is normal. Cardiac exam no murmur or gallop. Lungs are clear to auscultation. Distal pulses are well felt. Neurological Exam : Alert, conversant with slurred speech. Oriented to place, time, person, and situation. Diplopia with central gaze (improving). Saccadic dysmetria with horizontal gaze (improving). Right eye with impaired vertical gaze. Weakness of right eye lid closure. Unable to frown right forehead. Right facial droop. Strength of right hand diminished compared to the right. Fine motor skills of right hand diminished compared to the left (improved). LE with normal strength bilaterally. Finger-to-nose test normal with left UE, slow with the right UE. Normal  tone of UE and LE. Gait steady.   ASSESSMENT/PLAN Gabriel Tran is a 58 y.o. man with PMH of HTN,  hx of PE, hx of aortic dissection s/p repair, hyperglycemia (no DM diagnosis), CKD, presenting with acute right hand weakness, facial droop, and visual defect with CT head unremarkable for acute CVA. He did receive IV t-PA at 21:55 PM on 12/9 due to deficits and no contraindication to TPA.   Stroke:  CT head has not demonstrated acute infarct or bleed but MRI brain revealed small Left posterior frontal lobe infarct. MRA neck/head reveals no source of emboli. He reports having intermittent heart palpitations similar to his post-op A.fib palpitations.  Resultant  Right hand weakness, right facial weakness of upper and lower motor function, visual defect  MRI brain:  Acute small nonhemorrhagic posterior Left frontal lobe infarct. Tiny punctuate areas of blood breakdown products bilaterally in cerebellum, temporal lobe, left posterior-parietal frontal lobe and right caudate. Remote very small left anterior corona radiata infarct.    MRI neck: Prominent ectasia of carotid arteries bilaterally including common carotid artery and ICA with ectasia more notable on the left. Mild ectatic vertebral arteries with R vertebral artery slightly dominant in size. Very mild narrowing proximal L vertebral artery.    MRA  Neck/Head with and Wo Contrast:  Moderate MCA branch vessel narrowing and irregularity bilaterally. No significant carotid terminus or M1 segment of MCA. Mild irregularity and slight narrowing cavernous and supraclinoid segment of the ICA bilaterally. Moderate narrowing PICA branch vessels bilaterally.    2D Echo  EF 60-65% with no regional wall abnormality, mild AV regurgitation, mild LA and RA dilation  CT head wo contrast: No acute intracranial abnormalities. Unchanged appearance of symmetrical prominence of lateral ventricles.   LDL 148  HgbA1c 5.7%  SCDs for VTE prophylaxis  Diet  regular thin liquids  aspirin 81 mg orally every day prior to admission, await 48hrs post TPA, will start anticoagulation tonight or tomorrow, he is likely a good candidate for the novel anticoagulation agents.   Ongoing aggressive stroke risk factor management  Therapy recommendations:   Cardiology consult today for further evaluation of likely A. Fib--pt had post-op A. Fib years ago and reports having similar heart palpitations for the past few months. He may need a TEE as well for PFO evaluation.    Disposition: Pending  Hypertension  Home meds:   Diltiazem 180mg  24 hr, HCTZ 12.5mg , Losartan 50mg  daily, metoprolol 25mg  BID  Unstable  Patient counseled to be compliant with his blood pressure medications  Hyperlipidemia, had myalgia with Lipitor  Home meds:  None.   LDL 148 , goal < 70  Add   Crestor 20mg  daily  Continue statin at discharge  Diabetes, no hx of  HgbA1c of 5.7%, goal < 7.0  Other Stroke Risk Factors  Former Cigarette smoker, quit in '92  ETOH use, unknown frequency/quantity  Obesity, Body mass index is 34.59 kg/(m^2).   Aortic dissection type A, s/p repair  Other Active Problems  Uncontrolled HTN  Other Pertinent History  Hx of Aortic dissection type A s/p repair  Possible A. [facility] day # 2 I have personally examined this patient, reviewed notes, independently viewed imaging studies, participated in medical decision making and plan of care. I have made any additions or clarifications directly to the above note. Agree with note above. Plan to have outpatient recorder placed and TEE if necessary determined by cardiology. Likely discharge home if no therapy  Needs for inpatient rehab. Antony Contras, MD Medical Director [facility] Pager: [phone] 01/15/2014 4:45 PM   To contact Stroke Continuity provider, please  refer to [email or web address] After hours, contact General Neurology

## 2014-01-15 NOTE — Discharge Summary (Signed)
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  Past Medical History  Diagnosis Date  . Hypertension   . Hyperglycemia     a. A1C 5.9 in Sept 2013.  Marland Kitchen RBBB   . Renal insufficiency 08/28/2012   Past Surgical History  Procedure Laterality Date  . Knee surgery      LEFT 1981  . Carpal tunnel release      2004 RIGHT  . Wisdom tooth extraction    . Thoracic aortic aneurysm repair  10/30/2011    Procedure: THORACIC ASCENDING ANEURYSM REPAIR (AAA);  Surgeon: Ivin Poot, MD;  Location: [place];  Service: Open Heart Surgery;  Laterality: N/A;  Ascending aortic dissection repair, arch reconstruction, aortic valve repair  . Insertion of dialysis catheter  11/06/2011    Procedure: INSERTION OF DIALYSIS CATHETER;  Surgeon: Ivin Poot, MD;  Location: [employer];  Service: Thoracic;  Laterality: Right;  . Mediastinal exploration  11/06/2011    Procedure: MEDIASTINAL EXPLORATION;  Surgeon: Ivin Poot, MD;  Location: [place];  Service: Thoracic;  Laterality: N/A;  sternal closure; removal of wound vac  . Insertion of dialysis catheter  11/13/2011    Procedure: INSERTION OF DIALYSIS CATHETER;  Surgeon: Elam Dutch, MD;  Location: [place];  Service: Vascular;  Laterality: N/A;  Ultrasound guided.  . Cardiac valve replacement  10/2011    Aortic valve repaired  . Left heart catheterization with coronary angiogram N/A 10/30/2011    Procedure: LEFT HEART CATHETERIZATION WITH CORONARY  ANGIOGRAM;  Surgeon: Burnell Blanks, MD;  Location: [facility] CATH LAB;  Service: Cardiovascular;  Laterality: N/A;      Medication List    STOP taking these medications        aspirin 81 MG tablet      TAKE these medications        ALPRAZolam 0.5 MG tablet  Commonly known as:  XANAX  take 1 tablet by mouth every 6 hours if needed for anxiety     clopidogrel 75 MG tablet  Commonly known as:  PLAVIX  Take 1 tablet (75 mg total) by mouth daily.     diltiazem 180 MG 24 hr capsule  Commonly known as:  CARDIZEM CD  Take 1 capsule (180 mg total) by mouth daily.     DIPROLENE 0.05 % ointment  Generic drug:  augmented betamethasone dipropionate  Apply 1 application topically daily as needed (Xerosis).     hydrochlorothiazide 12.5 MG tablet  Commonly known as:  HYDRODIURIL  Take 1 tablet (12.5 mg total) by mouth daily.     losartan 50 MG tablet  Commonly known as:  COZAAR  Take 1 tablet (50 mg total) by mouth daily.     metoprolol tartrate 25 MG tablet  Commonly known as:  LOPRESSOR  Take 1 tablet (25 mg total) by mouth 2 (two) times daily.     multivitamin with minerals Tabs tablet  Take 1 tablet by mouth daily.  potassium chloride 10 MEQ tablet  Commonly known as:  K-DUR  take 1 tablet by mouth once daily     rosuvastatin 20 MG tablet  Commonly known as:  CRESTOR  Take 1 tablet (20 mg total) by mouth daily at 6 PM.        LABORATORY STUDIES CBC    Component Value Date/Time   WBC 8.8 01/13/2014 2110   RBC 4.73 01/13/2014 2110   HGB 15.6 01/13/2014 2119   HCT 46.0 01/13/2014 2119   PLT 177 01/13/2014 2110   MCV 87.3 01/13/2014 2110   MCH 30.7 01/13/2014 2110   MCHC 35.1 01/13/2014 2110   RDW 12.6 01/13/2014 2110   LYMPHSABS 2.5 01/13/2014 2110   MONOABS 0.8 01/13/2014 2110   EOSABS 0.4 01/13/2014 2110   BASOSABS 0.0 01/13/2014 2110   CMP    Component Value Date/Time   NA 136* 01/14/2014 0249   K 4.0 01/14/2014 0249   CL 98 01/14/2014 0249    CO2 26 01/14/2014 0249   GLUCOSE 109* 01/14/2014 0249   BUN 16 01/14/2014 0249   CREATININE 1.07 01/14/2014 0249   CREATININE 3.45* 12/05/2011 1213   CALCIUM 9.6 01/14/2014 0249   PROT 7.4 01/13/2014 2110   ALBUMIN 3.7 01/13/2014 2110   AST 20 01/13/2014 2110   ALT 18 01/13/2014 2110   ALKPHOS 62 01/13/2014 2110   BILITOT 0.4 01/13/2014 2110   GFRNONAA 76* 01/14/2014 0249   GFRAA 88* 01/14/2014 0249   COAGS Lab Results  Component Value Date   INR 0.96 01/13/2014   INR 2.8 09/01/2012   INR 2.2 05/29/2012   Lipid Panel    Component Value Date/Time   CHOL 226* 01/14/2014 0249   TRIG 108 01/14/2014 0249   HDL 56 01/14/2014 0249   CHOLHDL 4.0 01/14/2014 0249   VLDL 22 01/14/2014 0249   LDLCALC 148* 01/14/2014 0249   HgbA1C  Lab Results  Component Value Date   HGBA1C 5.7* 01/14/2014   Urinalysis    Component Value Date/Time   COLORURINE AMBER* 11/03/2011 1120   APPEARANCEUR TURBID* 11/03/2011 1120   LABSPEC 1.025 11/03/2011 1120   PHURINE 5.0 11/03/2011 1120   GLUCOSEU NEGATIVE 11/03/2011 1120   HGBUR SMALL* 11/03/2011 1120   BILIRUBINUR SMALL* 11/03/2011 1120   KETONESUR 15* 11/03/2011 1120   PROTEINUR NEGATIVE 11/03/2011 1120   UROBILINOGEN 1.0 11/03/2011 1120   NITRITE NEGATIVE 11/03/2011 1120   LEUKOCYTESUR NEGATIVE 11/03/2011 1120   Urine Drug Screen     Component Value Date/Time   LABOPIA NONE DETECTED 10/29/2011 2235   COCAINSCRNUR NONE DETECTED 10/29/2011 2235   LABBENZ NONE DETECTED 10/29/2011 2235   AMPHETMU NONE DETECTED 10/29/2011 [place] DETECTED 10/29/2011 2235   LABBARB POSITIVE* 10/29/2011 2235     SIGNIFICANT DIAGNOSTIC STUDIES  MRI brain: Acute small nonhemorrhagic posterior Left frontal lobe infarct. Tiny punctuate areas of blood breakdown products bilaterally in cerebellum, temporal lobe, left posterior-parietal frontal lobe and right caudate. Remote very small left anterior corona radiata infarct.   MRI neck: Prominent  ectasia of carotid arteries bilaterally including common carotid artery and ICA with ectasia more notable on the left. Mild ectatic vertebral arteries with R vertebral artery slightly dominant in size. Very mild narrowing proximal L vertebral artery.   MRA Neck/Head with and Wo Contrast: Moderate MCA branch vessel narrowing and irregularity bilaterally. No significant carotid terminus or M1 segment of MCA. Mild irregularity and slight narrowing cavernous and supraclinoid segment of the ICA bilaterally. Moderate narrowing PICA  branch vessels bilaterally.   2D Echo EF 60-65% with no regional wall abnormality, mild AV regurgitation, mild LA and RA dilation   CT head wo contrast: No acute intracranial abnormalities. Unchanged appearance of symmetrical prominence of lateral ventricles.     HISTORY OF PRESENT ILLNESS Gabriel Tran is a 58 year old man with PMH of hypertension, hx of PE, hx of aortic dissection s/p repair, hyperglycemia but not diagnosed with diabetes, renal insufficiency who presents with sudden onset right hand weakness, right facial droop, and visual deficits that started suddenly at 7:25 PM. He called 911 and EMS transported him and activated a code stroke en route. CT was negative and there were no contraindications to TPA and therefore the decision to give TPA was made after discussing risks and benefits with him and his wife. He was hypertensive and despite treatment remained hypertensive. Cardene drip was started and his blood pressure lowered allowing for TPA to be administered.   Patient was administered TPA at 9:55PM, 45 minutes from door to needle . He was admitted to the neuro ICU on 12/9 for further evaluation and treatment.    HOSPITAL COURSE Left subcortical infarct: He presented with sudden onset of right facial droop, visual deficits, and right hand weakness. CT head was negative for acute infarct but given his deficits, he received IV TPA with moderate  recovery of his symptoms. MRI brain revealed small LEFT posterior frontal lobe infarct. MRA neck/head revealed no source of emboli. He has history of intermittent pos-op A. Fib years ago after his aortic dissection repair surgery but was never diagnosed with PAF. He complained during this hospitalization of intermittent heart palpitations for the past few months. He had no A. Fib per telemetry during this hospitalization but Cardiology was consulted for further evaluation of possible PAF as this was considered as the possible cause for his stroke. Please see below for further discussion. He was discharged on Plavix 75mg  daily for stroke secondary prevent ation.   Paroxysmal A. Fib, Possible: This diagnosed was considered given his complaint of intermittent heart palpitations for the recent months and a possible cause for his stroke. Cardiology was consulted. He has TEE/LINQ scheduled for Wednesday 01-20-14 at [facility]. He is a good candidate for NOAC therapy if A. Fib diagnosis is confirmed--with discontinuation of Plavix. We continued his home diltiazem and metoprolol upon his discharge.   Hypertension: He reports having history of uncontrolled hypertension with SBP mildly elevated at times during this hospitalization. He is on Cardizem 180mg  daily, HCTZ 12.5mg  BID, Losartan 50mg  daily, and metoprolol 25mg  BID at home and will be discharge with these medications. He will follow up with his PCP and Cardiologist for further blood pressure control with target goal of <130/80.   Hyperlipidemia: His LDL was 148 with new target goal of <70 given his acute stroke. He has been on Lipitor in the past but did not tolerate this medication due to myalgias. He was started on Crestor 20mg  daily and will be discharged home on this therapy with follow up with his Cardiologist and his PCP for monitoring of side effects of this medication.    DISCHARGE EXAM Blood pressure 179/80, pulse 76, temperature 98.7 F (37.1 C),  temperature source Oral, resp. rate 16, height 5\' 7"  (1.702 m), weight 220 lb 14.4 oz (100.2 kg), SpO2 100 %. Alert, conversant with slurred speech. Oriented to place, time, person, and situation. Diplopia with central gaze (improving). Saccadic dysmetria with horizontal gaze (improving). Right eye with impaired vertical  gaze. Weakness of right eye lid closure. Unable to frown right forehead. Right facial droop. Strength of right hand diminished compared to the right. Fine motor skills of right hand diminished compared to the left (improved). LE with normal strength bilaterally. Finger-to-nose test normal with left UE, slow with the right UE. Normal tone of UE and LE. Gait steady.   Discharge Diet    Regular with thin liquids  DISCHARGE PLAN  Disposition:  Home with intermittent supervision (may need outpatient OT if strength/dexterity does not improve in 3 weeks)  clopidogrel 75 mg orally every day for secondary stroke prevention.  Follow-up Elsie Stain, MD in 2 weeks.  Follow-up with Dr. Antony Contras, Stroke Clinic in 1 month.  TEE/LINQ on 01/20/14 at [facility] at [place] at [place] after [place] on 01/19/14.  35 minutes were spent preparing discharge. I have personally examined this patient, reviewed notes, independently viewed imaging studies, participated in medical decision making and plan of care. I have made any additions or clarifications directly to the above note. Agree with note above. Dc home after cardiology consult. Outpatient TEE/Linq for afib.  Antony Contras, MD Medical Director [facility] Pager: [phone] 01/15/2014 10:11 PM

## 2014-01-15 NOTE — Plan of Care (Signed)
Problem: tPA Day Progression Outcomes-Only if tPA administered Goal: Post tPA image without hemorrhage Outcome: Not Applicable Date Met:  11/94/17 MRI done in its place  Problem: Acute Treatment Outcomes Goal: Neuro exam at baseline or improved Outcome: Completed/Met Date Met:  01/15/14

## 2014-01-15 NOTE — Discharge Instructions (Signed)
You have been scheduled for a TEE study and LINQ for Wednesday 01-20-14 at [facility] need to arrive at the Short Stay at [facility].  Please do not eat or drink any liquids after midnight on 01-19-14.

## 2014-01-15 NOTE — Evaluation (Signed)
Speech Language Pathology Evaluation Patient Details Name: Gabriel Tran MRN: [id number] DOB: 28-Feb-1955 Today's Date: 01/15/2014 Time: 9211-9417 SLP Time Calculation (min) (ACUTE ONLY): 15 min  Problem List:  Patient Active Problem List   Diagnosis Date Noted  . Cerebral infarct   . Stroke 01/13/2014  . Advance care planning 08/18/2013  . Pulmonary embolism 12/04/2011  . ESRD (end stage renal disease) 12/04/2011  . A-fib 11/09/2011  . Aortic dissection 10/31/2011  . RBBB 10/30/2011  . HTN (hypertension), malignant 10/29/2011  . Routine general medical examination at a health care facility 09/07/2011  . Back pain 09/07/2011  . HYPERLIPIDEMIA 06/11/2007  . Benign hypertensive heart disease without heart failure 06/11/2007  . PSORIASIS 06/11/2007  . HYPERGLYCEMIA 06/11/2007   Past Medical History:  Past Medical History  Diagnosis Date  . Hypertension   . Hyperglycemia     a. A1C 5.9 in Sept 2013.  Marland Kitchen RBBB   . Renal insufficiency 08/28/2012   Past Surgical History:  Past Surgical History  Procedure Laterality Date  . Knee surgery      LEFT 1981  . Carpal tunnel release      2004 RIGHT  . Wisdom tooth extraction    . Thoracic aortic aneurysm repair  10/30/2011    Procedure: THORACIC ASCENDING ANEURYSM REPAIR (AAA);  Surgeon: Ivin Poot, MD;  Location: [place];  Service: Open Heart Surgery;  Laterality: N/A;  Ascending aortic dissection repair, arch reconstruction, aortic valve repair  . Insertion of dialysis catheter  11/06/2011    Procedure: INSERTION OF DIALYSIS CATHETER;  Surgeon: Ivin Poot, MD;  Location: [place];  Service: Thoracic;  Laterality: Right;  . Mediastinal exploration  11/06/2011    Procedure: MEDIASTINAL EXPLORATION;  Surgeon: Ivin Poot, MD;  Location: [place];  Service: Thoracic;  Laterality: N/A;  sternal closure; removal of wound vac  . Insertion of dialysis catheter  11/13/2011    Procedure: INSERTION OF DIALYSIS CATHETER;  Surgeon: Elam Dutch, MD;  Location: [place];  Service: Vascular;  Laterality: N/A;  Ultrasound guided.  . Cardiac valve replacement  10/2011    Aortic valve repaired  . Left heart catheterization with coronary angiogram N/A 10/30/2011    Procedure: LEFT HEART CATHETERIZATION WITH CORONARY ANGIOGRAM;  Surgeon: Burnell Blanks, MD;  Location: [facility] CATH LAB;  Service: Cardiovascular;  Laterality: N/A;   HPI:  Patient is a 58 y/o male with PMH of HTN, hx of PE, hx of aortic dissection s/p repair, hyperglycemia and renal insufficiency who presents with sudden onset right hand weakness, right facial droop, and visual deficits. Code stroke initiated, CT negative, MRI acute small to slightly moderate size nonhemorrhagic posterior left frontal lobe, TPA administered.     Assessment / Plan / Recommendation Clinical Impression  Orders received; evaluation completed.  Patient presented with a restless demeanor, up walking around room and he expressed frustration with how much he was being charged for all this unneeded therapy.  As a result, overall cooperation limited full evaluation.  Family member/friend who was present did not report any changes in personality or cognition and as a result, evaluation focused on patient's concern regarding his speech intelligibility. Oral motor exam remarkable for mild right labial motor impairment, which results in mild speech distortions at the conversational level; however, overall intelligibility is [facility].  SLP educated patient on effective compensatory strategies for speech intelligibility which he was able to return demonstration of.  Given, mild deficits and patient's desire to not participate in  therapy at this time recommend no skilled follow-up at this time.  However, patient made aware of outpatient services if deficits do not resolve.      SLP Assessment  Patient does not need any further Speech Lanaguage Pathology Services    Follow Up Recommendations  None             Pertinent Vitals/Pain Pain Assessment: No/denies pain   SLP Goals  Progression toward goals: Progressing toward goals Patient/Family Stated Goal: to go home  SLP Evaluation Prior Functioning  Cognitive/Linguistic Baseline:  (pt denies any changes ) Type of Home: House Available Help at Discharge: Family   Cognition  Overall Cognitive Status: Within Functional Limits for tasks assessed Arousal/Alertness: Awake/alert Orientation Level: Oriented X4 Attention: Selective Selective Attention: Appears intact Memory: Appears intact Behaviors: Restless Safety/Judgment: Appears intact    Comprehension  Auditory Comprehension Overall Auditory Comprehension: Appears within functional limits for tasks assessed Visual Recognition/Discrimination Discrimination: Within Function Limits Reading Comprehension Reading Status: Not tested    Expression Expression Primary Mode of Expression: Verbal Verbal Expression Overall Verbal Expression: Appears within functional limits for tasks assessed Written Expression Dominant Hand: Right Written Expression: Not tested   Oral / Motor Oral Motor/Sensory Function Overall Oral Motor/Sensory Function: Impaired Labial ROM: Reduced right Labial Symmetry: Abnormal symmetry right Labial Strength: Reduced Labial Sensation: Within Functional Limits Lingual ROM: Within Functional Limits Lingual Symmetry: Within Functional Limits Lingual Strength: Within Functional Limits Lingual Sensation: Within Functional Limits Facial ROM: Reduced right Facial Symmetry: Right droop Facial Strength: Reduced Facial Sensation: Within Functional Limits Velum: Within Functional Limits Mandible: Within Functional Limits Motor Speech Overall Motor Speech: Impaired Respiration: Within functional limits Phonation: Normal Resonance: Within functional limits Articulation: Impaired Level of Impairment: Conversation Intelligibility: Intelligible Motor Planning: Witnin  functional limits Motor Speech Errors: Not applicable Effective Techniques: Slow rate;Over-articulate   GO     Carmelia Roller., CCC-SLP [phone]  [place] 01/15/2014, 1:42 PM

## 2014-01-15 NOTE — Progress Notes (Signed)
Patient is refusing bed alarm and is very irritable with the nurse. This RN explained the reasoning behind the alarms but patient is still unwilling to comply. Will monitor. Marnee Sherrard, Rande Brunt

## 2014-01-15 NOTE — Progress Notes (Signed)
Patient discharged home per order. Discharged instructions given to patient and spouse. Patient and spouse stated understanding of instructions given. Stroke prevention pamphlets, and medications handouts also given to patient. RN informed patient and spouse the importance of his appointment for December 16th for TEE. And also to be NPO on 01/19/2014 by midnight. Tomma Rakers RN

## 2014-01-15 NOTE — Progress Notes (Signed)
Patient placed on NPO per MD order. RN informed patient about NPO status and awaiting Cardiology. Tomma Rakers RN

## 2014-01-15 NOTE — Evaluation (Signed)
Physical Therapy Evaluation Patient Details Name: Gabriel Tran MRN: [id number] DOB: 1955/09/19 Today's Date: 01/15/2014   History of Present Illness  Patient is a 58 y/o male with PMH of HTN, hx of PE, hx of aortic dissection s/p repair, hyperglycemia and renal insufficiency who presents with sudden onset right hand weakness, right facial droop, and visual deficits. CT (-). Code Stroke s/p tPA. MRI- Acute small to slightly moderate size nonhemorrhagic posterior left frontal lobe.    Clinical Impression  Patient presents with slurred speech, right facial droop and impaired coordination/strength in RUE. Pt able to safely ambulate and negotiate steps without difficulty while performing highly level balance challenges. No evidence of balance deficits. Pt does not require skilled therapy services from a mobility stand point. Discharge from therapy. Education provided to use RUE as much as possible during functional tasks/activities.    Follow Up Recommendations No PT follow up;Supervision - Intermittent    Equipment Recommendations  None recommended by PT    Recommendations for Other Services       Precautions / Restrictions Precautions Precautions: None Restrictions Weight Bearing Restrictions: No      Mobility  Bed Mobility Overal bed mobility: Modified Independent                Transfers Overall transfer level: Independent Equipment used: None                Ambulation/Gait Ambulation/Gait assistance: Independent Ambulation Distance (Feet): 300 Feet Assistive device: None Gait Pattern/deviations: WFL(Within Functional Limits)   Gait velocity interpretation: at or above normal speed for age/gender General Gait Details: Pt with steady gait even with head turns, changes in gait speed and sudden stops. No overt LOB.  Stairs Stairs: Yes Stairs assistance: Modified independent (Device/Increase time) Stair Management: No rails Number of Stairs: 5 General  stair comments: No LOB or difficulty.  Wheelchair Mobility    Modified Rankin (Stroke Patients Only) Modified Rankin (Stroke Patients Only) Pre-Morbid Rankin Score: No symptoms Modified Rankin: Slight disability     Balance Overall balance assessment: Needs assistance Sitting-balance support: Feet supported;No upper extremity supported Sitting balance-Leahy Scale: Normal     Standing balance support: During functional activity Standing balance-Leahy Scale: Good               High level balance activites: Direction changes;Turns;Sudden stops;Head turns High Level Balance Comments: Tolerated above higher level balance activities without LOB or difficulty. Able to step over objects without LOB.             Pertinent Vitals/Pain Pain Assessment: No/denies pain    Home Living Family/patient expects to be discharged to:: Private residence Living Arrangements: Spouse/significant other Available Help at Discharge: Family Type of Home: House Home Access: Stairs to enter Entrance Stairs-Rails: Can reach both Entrance Stairs-Number of Steps: 5 Home Layout: One level Home Equipment: None      Prior Function Level of Independence: Independent               Hand Dominance   Dominant Hand: Right    Extremity/Trunk Assessment   Upper Extremity Assessment: Defer to OT evaluation RUE Deficits / Details: Impaired coordination as exhibited by overshooting target during FTN test. Not able to perform finger to thumb on right. Able to pick up cup/soda and pour soda in cup.         Lower Extremity Assessment: Overall WFL for tasks assessed         Communication   Communication: No difficulties  Cognition Arousal/Alertness:  Awake/alert Behavior During Therapy: WFL for tasks assessed/performed Overall Cognitive Status: Within Functional Limits for tasks assessed                      General Comments General comments (skin integrity, edema, etc.):  Education provided on importance of using RUE during functional tasks.    Exercises        Assessment/Plan    PT Assessment Patent does not need any further PT services  PT Diagnosis     PT Problem List    PT Treatment Interventions     PT Goals (Current goals can be found in the Care Plan section) Acute Rehab PT Goals Patient Stated Goal: to get home and return to work PT Goal Formulation: With patient Time For Goal Achievement: 01/29/14 Potential to Achieve Goals: Good    Frequency     Barriers to discharge        Co-evaluation               End of Session   Activity Tolerance: Patient tolerated treatment well Patient left: in bed;with call bell/phone within reach (Refusing bed alarm.) Nurse Communication: Mobility status         Time: 3354-5625 PT Time Calculation (min) (ACUTE ONLY): 15 min   Charges:   PT Evaluation $Initial PT Evaluation Tier I: 1 Procedure PT Treatments $Gait Training: 8-22 mins   PT G CodesCandy Sledge A 01/15/2014, 9:54 AM  Candy Sledge, [place], DPT [phone]

## 2014-01-15 NOTE — Progress Notes (Signed)
Occupational Therapy Evaluation and Discharge Patient Details Name: Gabriel Tran MRN: [id number] DOB: [birth year] Today's Date: 01/15/2014    History of Present Illness Patient is a 58 y/o male with PMH of HTN, hx of PE, hx of aortic dissection s/p repair, hyperglycemia and renal insufficiency who presents with sudden onset right hand weakness, right facial droop, and visual deficits. CT (-). Code Stroke s/p tPA. MRI- Acute small to slightly moderate size nonhemorrhagic posterior left frontal lobe.   Clinical Impression   PTA pt lived at home and was independent with ADLs and IADLs. Pt presents with decreased R (dominant) hand strength and coordination impacting his fine motor skills. Pt expressed concern at cost of therapy and asked for home exercises to do. Provided pt with theraputty (yellow and tan) and exercises/activities to complete at home daily to promote fine motor coordination and strength. Pt declines need for further acute OT and is hopeful to d/c today.     Follow Up Recommendations  No OT follow up;Other (comment) (encouraged pt to contact MD if no change in strength/dexterity in 2 weeks to discuss possibility of Outpatient OT)    Equipment Recommendations  None recommended by OT    Recommendations for Other Services       Precautions / Restrictions Precautions Precautions: None Restrictions Weight Bearing Restrictions: No      Mobility Bed Mobility               General bed mobility comments: Pt sitting up in recliner when OT arrived.   Transfers Overall transfer level: Independent                         ADL Overall ADL's : Modified independent                                       General ADL Comments: Pt overall Mod I, however may need assistance opening tight containers due to decreased R hand strength and dexterity.      Vision  Pt wears glasses and reports no change from baseline.                     Perception Perception Perception Tested?: No   Praxis Praxis Praxis tested?: Within functional limits    Pertinent Vitals/Pain Pain Assessment: No/denies pain     Hand Dominance Right   Extremity/Trunk Assessment Upper Extremity Assessment Upper Extremity Assessment: RUE deficits/detail RUE Deficits / Details: Impaired coordination as exhibited by overshooting target during FTN test. Not able to perform finger to thumb on right. Able to pick up cup/soda and pour soda in cup. Pt reports he tied his shoes earlier, but it "took some time." Composite digit flexion 3+/5. Pt able to extend/close fully against some resistance.  RUE Coordination: decreased fine motor   Lower Extremity Assessment Lower Extremity Assessment: Overall WFL for tasks assessed   Cervical / Trunk Assessment Cervical / Trunk Assessment: Normal   Communication Communication Communication: Expressive difficulties (mild due to right side facial droop)   Cognition Arousal/Alertness: Awake/alert Behavior During Therapy: WFL for tasks assessed/performed;Restless Overall Cognitive Status: Within Functional Limits for tasks assessed                        Exercises Exercises: Other exercises Other Exercises Other Exercises: provided pt with theraputty (tan/ supersoft and yellow/soft) in addition to  handout with fine motor coordination activities and theraputty exercises. Encouraged pt to perform 3x per day for ~10 minutes.  Other Exercises: encouraged functional use of RUE during daily activities (including work tasks, when pt returns) to increase strength and dexterity.         Home Living Family/patient expects to be discharged to:: Private residence Living Arrangements: Spouse/significant other Available Help at Discharge: Family Type of Home: House Home Access: Stairs to enter Technical brewer of Steps: 5 Entrance Stairs-Rails: Can reach both [place]: One level     Bathroom  Shower/Tub: Teacher, early years/pre: Standard     Home Equipment: None          Prior Functioning/Environment Level of Independence: Independent        Comments: Pt works in Writer Diagnosis: Generalized weakness   OT Problem List: Decreased strength;Decreased coordination;Impaired UE functional use    End of Session Equipment Utilized During Treatment: Other (comment) (theraputty (super soft/tan and soft/yellow))  Activity Tolerance: Patient tolerated treatment well Patient left: in chair;with call bell/phone within reach;with family/visitor present   Time: 4010-2725 OT Time Calculation (min): 20 min Charges:  OT General Charges $OT Visit: 1 Procedure OT Evaluation $Initial OT Evaluation Tier I: 1 Procedure OT Treatments $Therapeutic Exercise: 8-22 mins  Juluis Rainier 01/15/2014, 2:13 PM   Cyndie Chime, OTR/L Occupational Therapist [phone] (pager)

## 2014-01-16 ENCOUNTER — Other Ambulatory Visit: Payer: Self-pay | Admitting: Family Medicine

## 2014-01-16 ENCOUNTER — Other Ambulatory Visit: Payer: Self-pay | Admitting: Internal Medicine

## 2014-01-18 ENCOUNTER — Ambulatory Visit (INDEPENDENT_AMBULATORY_CARE_PROVIDER_SITE_OTHER): Payer: 59 | Admitting: Cardiology

## 2014-01-18 VITALS — BP 148/82 | HR 59 | Ht 67.0 in | Wt 219.0 lb

## 2014-01-18 DIAGNOSIS — I10 Essential (primary) hypertension: Secondary | ICD-10-CM

## 2014-01-18 DIAGNOSIS — E7800 Pure hypercholesterolemia, unspecified: Secondary | ICD-10-CM

## 2014-01-18 DIAGNOSIS — F329 Major depressive disorder, single episode, unspecified: Secondary | ICD-10-CM

## 2014-01-18 DIAGNOSIS — I119 Hypertensive heart disease without heart failure: Secondary | ICD-10-CM

## 2014-01-18 DIAGNOSIS — I4891 Unspecified atrial fibrillation: Secondary | ICD-10-CM

## 2014-01-18 DIAGNOSIS — F32A Depression, unspecified: Secondary | ICD-10-CM

## 2014-01-18 MED ORDER — SERTRALINE HCL 25 MG PO TABS: 25.0000 mg | ORAL_TABLET | Freq: Every day | ORAL | Status: DC

## 2014-01-18 MED ORDER — ALPRAZOLAM 0.5 MG PO TABS: ORAL_TABLET | ORAL | Status: DC

## 2014-01-18 MED ORDER — LOSARTAN POTASSIUM 100 MG PO TABS: 100.0000 mg | ORAL_TABLET | Freq: Every day | ORAL | Status: DC

## 2014-01-18 NOTE — Progress Notes (Signed)
Gabriel Tran Date of Birth:  [birth year] [facility] [address] [place], [place]  [address] [phone]        Fax   [phone]   History of Present Illness: This 58 year old gentleman is seen for a post hospital office visit.  He has a past history of an aortic dissection in the setting of uncontrolled hypertension.  The dissection was in the ascending aorta.  He underwent emergent repair and his postop course was complicated by PEA arrest and pulmonary emboli.  He has a long history of hypertension. He presented to [facility] on 01/13/14 with evidence of a left brain stroke with right hemiparesis.  His initial blood pressure was in the range of 200/115.  He was given intravenous medication to bring his blood pressure down quickly after which she was given TPA.  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 The patient is scheduled for a TEE and for insertion of a LINQ recorder  on December 16.  Current Outpatient Prescriptions  Medication Sig Dispense Refill  . ALPRAZolam (XANAX) 0.5 MG tablet take 1 tablet by mouth every 6 hours if needed for anxiety 30 tablet 0  . augmented betamethasone dipropionate (DIPROLENE) 0.05 % ointment Apply 1 application topically daily as needed (Xerosis).    . clopidogrel (PLAVIX) 75 MG tablet Take 1 tablet (75 mg total) by mouth daily. 30 tablet 0  . diltiazem (CARDIZEM CD) 180 MG 24 hr capsule Take 1 capsule (180 mg total) by mouth daily. 90 capsule 3  . hydrochlorothiazide (HYDRODIURIL) 12.5 MG tablet Take 1 tablet (12.5 mg total) by mouth daily. 90 tablet 3  . losartan (COZAAR) 100 MG tablet Take 1 tablet (100 mg total) by mouth daily. 90 tablet 1  . metoprolol tartrate (LOPRESSOR) 25 MG tablet Take 1 tablet (25 mg total) by mouth 2 (two) times daily. 180 tablet 3  . Multiple Vitamin (MULTIVITAMIN WITH MINERALS) TABS tablet Take 1 tablet by mouth daily.    .  potassium chloride (K-DUR) 10 MEQ tablet take 1 tablet by mouth once daily 90 tablet 3  . rosuvastatin (CRESTOR) 20 MG tablet Take 1 tablet (20 mg total) by mouth daily at 6 PM. 30 tablet 0  . sertraline (ZOLOFT) 25 MG tablet Take 1 tablet (25 mg total) by mouth daily. 90 tablet 1   No current facility-administered medications for this visit.    Allergies  Allergen Reactions  . Lipitor [Atorvastatin]     aches    Patient Active Problem List   Diagnosis Date Noted  . Depression 01/18/2014  . Cerebral infarct   . Stroke 01/13/2014  . Advance care planning 08/18/2013  . Pulmonary embolism 12/04/2011  . ESRD (end stage renal disease) 12/04/2011  . A-fib 11/09/2011  . Aortic dissection 10/31/2011  . RBBB 10/30/2011  . HTN (hypertension), malignant 10/29/2011  . Routine general medical examination at a health care facility 09/07/2011  . Back pain 09/07/2011  . HYPERLIPIDEMIA 06/11/2007  . Benign hypertensive heart disease without heart failure 06/11/2007  . PSORIASIS 06/11/2007  . HYPERGLYCEMIA 06/11/2007    History  Smoking status  . Former Smoker  . Types: Cigarettes  . Quit date: 02/05/1990  Smokeless tobacco  . Not on file    Comment: Smoked for 20 years.    History  Alcohol Use  . 0.0 oz/week    Comment: 6 beers a week on average  Family History  Problem Relation Age of Onset  . Parkinsonism Mother   . Leukemia Father     AML at 91  . Colon polyps Brother   . Kidney disease Brother   . Heart disease Brother     CABG at age 59.  . Prostate cancer Neg Hx   . Colon cancer Neg Hx   . Heart disease Father     Angioplasty in his 53s, CABG age 35.    Review of Systems: Constitutional: no fever chills diaphoresis or fatigue or change in weight.  Head and neck: no hearing loss, no epistaxis, no photophobia or visual disturbance. Respiratory: No cough, shortness of breath or wheezing. Cardiovascular: No chest pain peripheral edema,  palpitations. Gastrointestinal: No abdominal distention, no abdominal pain, no change in bowel habits hematochezia or melena. Genitourinary: No dysuria, no frequency, no urgency, no nocturia. Musculoskeletal:No arthralgias, no back pain, no gait disturbance or myalgias. Neurological: No dizziness, no headaches, no numbness, no seizures, no syncope, no weakness, no tremors. Hematologic: No lymphadenopathy, no easy bruising. Psychiatric: No confusion, no hallucinations, no sleep disturbance.   Wt Readings from Last 3 Encounters:  01/18/14 219 lb (99.338 kg)  01/13/14 220 lb 14.4 oz (100.2 kg)  08/17/13 213 lb 8 oz (96.843 kg)    Physical Exam: Filed Vitals:   01/18/14 1218  BP: 148/82  Pulse: 59  The patient appears to be in no distress.  Head and neck exam reveals that the pupils are equal and reactive.  The extraocular movements are full.  There is no scleral icterus.  Mouth and pharynx are benign.  No lymphadenopathy.  No carotid bruits.  The jugular venous pressure is normal.  Thyroid is not enlarged or tender.  The patient still has a right facial droop   Chest is clear to percussion and auscultation.  No rales or rhonchi.  Expansion of the chest is symmetrical.  Heart reveals no abnormal lift or heave.  First and second heart sounds are normal.  There is no murmur gallop rub or click.  The aortic closure sound is accentuated.  There is no aortic insufficiency murmur heard  The abdomen is soft and nontender.  Bowel sounds are normoactive.  There is no hepatosplenomegaly or mass.  There are no abdominal bruits.  Extremities reveal no phlebitis or edema.  Pedal pulses are good.  There is no cyanosis or clubbing.  Neurologic exam is normal strength and no lateralizing weakness.  No sensory deficits.  Integument reveals no rash    Assessment / Plan: 1.  Recent left brain stroke felt to be probably embolic , successfully treated with TPA.   2.  Severe essential hypertension  3.   Prior history of a ascending aortic root dissection with emergent surgery . 4.  Prior history of PEA arrest and pulmonary emboli postoperatively  5.  Hypercholesterolemia  6.  Depression    disposition: For better blood pressure control we will increase losartan up to 100 mg daily.  The patient complains of being very depressed.  We will add generic Zoloft 25 mg daily. The patient will undergo TEE and insertion of Link on 01/20/14  Return here in 2 months for office visit EKG lipid panel hepatic function panel and basal metabolic panel. Encouraged patient to lose weight.

## 2014-01-18 NOTE — Assessment & Plan Note (Signed)
The patient will undergo a insertion of a Linq recorder to determine whether he is having unrecognized atrial fibrillation.  If atrial fibrillation is found, he will need to be placed on long-term anticoagulation.

## 2014-01-18 NOTE — Telephone Encounter (Signed)
My Chart Refill Request.  Last Filled:    30 tablet 0 RF on 08/17/2013  Please advise.

## 2014-01-18 NOTE — Assessment & Plan Note (Signed)
The patient will continue on aggressive antilipid therapy.  In the past he did not tolerate Lipitor.  However he is tolerating Crestor

## 2014-01-18 NOTE — Assessment & Plan Note (Signed)
His blood pressure has improved significantly but is still above target.  We will increase his losartan and up to 100 mg daily.

## 2014-01-18 NOTE — Patient Instructions (Signed)
INCREASE YOUR LOSARTAN TO 100 MG DAILY   START ZOLOFT 25 MG DAILY   Work harder on diet and weight loss  Your physician recommends that you schedule a follow-up appointment in: 2 month ov/ekg/lp/bmet/hfp

## 2014-01-18 NOTE — Telephone Encounter (Signed)
Please call in.  Due for f/u, 30 min visit since he was recently in the hospital.  Thanks.

## 2014-01-19 MED ORDER — ALPRAZOLAM 0.5 MG PO TABS: ORAL_TABLET | ORAL | Status: DC

## 2014-01-19 NOTE — Telephone Encounter (Signed)
Medication phoned to pharmacy. Left detailed message on voicemail of patient. 

## 2014-01-20 ENCOUNTER — Encounter (HOSPITAL_COMMUNITY)
Admission: RE | Disposition: A | Discharge status: Home / Self Care | Payer: Self-pay | Source: Ambulatory Visit | Attending: Cardiology

## 2014-01-20 ENCOUNTER — Ambulatory Visit (HOSPITAL_COMMUNITY)
Admission: RE | Admit: 2014-01-20 | Discharge: 2014-01-20 | Disposition: A | Discharge status: Home / Self Care | Payer: 59 | Source: Ambulatory Visit | Attending: Cardiology | Admitting: Cardiology

## 2014-01-20 ENCOUNTER — Encounter (HOSPITAL_COMMUNITY): Payer: Self-pay | Admitting: *Deleted

## 2014-01-20 DIAGNOSIS — I639 Cerebral infarction, unspecified: Secondary | ICD-10-CM | POA: Insufficient documentation

## 2014-01-20 DIAGNOSIS — E785 Hyperlipidemia, unspecified: Secondary | ICD-10-CM | POA: Insufficient documentation

## 2014-01-20 DIAGNOSIS — Z87891 Personal history of nicotine dependence: Secondary | ICD-10-CM | POA: Insufficient documentation

## 2014-01-20 DIAGNOSIS — Z8249 Family history of ischemic heart disease and other diseases of the circulatory system: Secondary | ICD-10-CM | POA: Insufficient documentation

## 2014-01-20 DIAGNOSIS — F329 Major depressive disorder, single episode, unspecified: Secondary | ICD-10-CM | POA: Insufficient documentation

## 2014-01-20 DIAGNOSIS — I1311 Hypertensive heart and chronic kidney disease without heart failure, with stage 5 chronic kidney disease, or end stage renal disease: Secondary | ICD-10-CM | POA: Diagnosis not present

## 2014-01-20 DIAGNOSIS — I4891 Unspecified atrial fibrillation: Secondary | ICD-10-CM | POA: Diagnosis not present

## 2014-01-20 DIAGNOSIS — N186 End stage renal disease: Secondary | ICD-10-CM | POA: Diagnosis not present

## 2014-01-20 DIAGNOSIS — I2699 Other pulmonary embolism without acute cor pulmonale: Secondary | ICD-10-CM | POA: Diagnosis not present

## 2014-01-20 DIAGNOSIS — Z841 Family history of disorders of kidney and ureter: Secondary | ICD-10-CM | POA: Insufficient documentation

## 2014-01-20 DIAGNOSIS — E780 Pure hypercholesterolemia: Secondary | ICD-10-CM | POA: Insufficient documentation

## 2014-01-20 DIAGNOSIS — Z86711 Personal history of pulmonary embolism: Secondary | ICD-10-CM | POA: Insufficient documentation

## 2014-01-20 DIAGNOSIS — Z79899 Other long term (current) drug therapy: Secondary | ICD-10-CM | POA: Insufficient documentation

## 2014-01-20 HISTORY — DX: Dissection of unspecified site of aorta: I71.00

## 2014-01-20 HISTORY — DX: Cerebral infarction, unspecified: I63.9

## 2014-01-20 HISTORY — DX: Other pulmonary embolism without acute cor pulmonale: I26.99

## 2014-01-20 HISTORY — PX: TEE WITHOUT CARDIOVERSION: SHX5443

## 2014-01-20 HISTORY — PX: LOOP RECORDER IMPLANT: SHX5477

## 2014-01-20 SURGERY — LOOP RECORDER IMPLANT
Anesthesia: LOCAL

## 2014-01-20 SURGERY — ECHOCARDIOGRAM, TRANSESOPHAGEAL
Anesthesia: Moderate Sedation

## 2014-01-20 MED ORDER — BUTAMBEN-TETRACAINE-BENZOCAINE 2-2-14 % EX AERO
INHALATION_SPRAY | CUTANEOUS | Status: DC | PRN
  Administered 2014-01-20: 2 via TOPICAL

## 2014-01-20 MED ORDER — SODIUM CHLORIDE 0.9 % IV SOLN
INTRAVENOUS | Status: DC
  Administered 2014-01-20: 14:00:00 via INTRAVENOUS

## 2014-01-20 MED ORDER — FENTANYL CITRATE 0.05 MG/ML IJ SOLN
INTRAMUSCULAR | Status: AC
  Filled 2014-01-20: qty 2

## 2014-01-20 MED ORDER — MIDAZOLAM HCL 10 MG/2ML IJ SOLN
INTRAMUSCULAR | Status: DC | PRN
  Administered 2014-01-20 (×2): 1 mg via INTRAVENOUS
  Administered 2014-01-20: 2 mg via INTRAVENOUS

## 2014-01-20 MED ORDER — LIDOCAINE-EPINEPHRINE 1 %-1:100000 IJ SOLN
INTRAMUSCULAR | Status: AC
  Filled 2014-01-20: qty 1

## 2014-01-20 MED ORDER — MIDAZOLAM HCL 5 MG/ML IJ SOLN
INTRAMUSCULAR | Status: AC
  Filled 2014-01-20: qty 2

## 2014-01-20 MED ORDER — FENTANYL CITRATE 0.05 MG/ML IJ SOLN
INTRAMUSCULAR | Status: DC | PRN
  Administered 2014-01-20 (×2): 25 ug via INTRAVENOUS

## 2014-01-20 NOTE — Discharge Instructions (Signed)
Transesophageal Echocardiogram °Transesophageal echocardiography (TEE) is a special type of test that produces images of the heart by using sound waves (echocardiogram). This type of echocardiography can obtain better images of the heart than standard echocardiography. TEE is done by passing a flexible tube down the esophagus. The heart is located in front of the esophagus. Because the heart and esophagus are close to one another, your health care provider can take very clear, detailed pictures of the heart via ultrasound waves. °TEE may be done: °· If your health care provider needs more information based on standard echocardiography findings. °· If you had a stroke. This might have happened because a clot formed in your heart. TEE can visualize different areas of the heart and check for clots. °· To check valve anatomy and function. °· To check for infection on the inside of your heart (endocarditis). °· To evaluate the dividing wall (septum) of the heart and presence of a hole that did not close after birth (patent foramen ovale or atrial septal defect). °· To help diagnose a tear in the wall of the aorta (aortic dissection). °· During cardiac valve surgery. This allows the surgeon to assess the valve repair before closing the chest. °· During a variety of other cardiac procedures to guide positioning of catheters. °· Sometimes before a cardioversion, which is a shock to convert heart rhythm back to normal. °LET YOUR HEALTH CARE PROVIDER KNOW ABOUT:  °· Any allergies you have. °· All medicines you are taking, including vitamins, herbs, eye drops, creams, and over-the-counter medicines. °· Previous problems you or members of your family have had with the use of anesthetics. °· Any blood disorders you have. °· Previous surgeries you have had. °· Medical conditions you have. °· Swallowing difficulties. °· An esophageal obstruction. °RISKS AND COMPLICATIONS  °Generally, TEE is a safe procedure. However, as with any  procedure, complications can occur. Possible complications include an esophageal tear (rupture). °BEFORE THE PROCEDURE  °· Do not eat or drink for 6 hours before the procedure or as directed by your health care provider. °· Arrange for someone to drive you home after the procedure. Do not drive yourself home. During the procedure, you will be given medicines that can continue to make you feel drowsy and can impair your reflexes. °· An IV access tube will be started in the arm. °PROCEDURE  °· A medicine to help you relax (sedative) will be given through the IV access tube. °· A medicine may be sprayed or gargled to numb the back of the throat. °· Your blood pressure, heart rate, and breathing (vital signs) will be monitored during the procedure. °· The TEE probe is a long, flexible tube. The tip of the probe is placed into the back of the mouth, and you will be asked to swallow. This helps to pass the tip of the probe into the esophagus. Once the tip of the probe is in the correct area, your health care provider can take pictures of the heart. °· TEE is usually not a painful procedure. You may feel the probe press against the back of the throat. The probe does not enter the trachea and does not affect your breathing. °AFTER THE PROCEDURE  °· You will be in bed, resting, until you have fully returned to consciousness. °· When you first awaken, your throat may feel slightly sore and will probably still feel numb. This will improve slowly over time. °· You will not be allowed to eat or drink until it   is clear that the numbness has improved. °· Once you have been able to drink, urinate, and sit on the edge of the bed without feeling sick to your stomach (nausea) or dizzy, you may be cleared to go home. °· You should have a friend or family member with you for the next 24 hours after your procedure. °Document Released: 04/14/2002 Document Revised: 01/27/2013 Document Reviewed: 07/24/2012 °[employer]® Patient Information  ©2015 [employer]. This information is not intended to replace advice given to you by your health care provider. Make sure you discuss any questions you have with your health care provider. ° °

## 2014-01-20 NOTE — Interval H&P Note (Signed)
History and Physical Interval Note:  01/20/2014 2:46 PM  Gabriel Tran  has presented today for surgery, with the diagnosis of STROKE  The various methods of treatment have been discussed with the patient and family. After consideration of risks, benefits and other options for treatment, the patient has consented to  Procedure(s): TRANSESOPHAGEAL ECHOCARDIOGRAM (TEE) (N/A) as a surgical intervention .  The patient's history has been reviewed, patient examined, no change in status, stable for surgery.  I have reviewed the patient's chart and labs.  Questions were answered to the patient's satisfaction.     Cherine Drumgoole

## 2014-01-20 NOTE — CV Procedure (Signed)
EP Procedure Note  Procedure: Insertion of an ILR  Indication: cryptogenic stroke  Pre-procedure Diagnosis: cryptogenic stroke  Post-procedure Diagnosis: Same as preprocedure diagnosis  Description of the procedure: After informed consent was obtained, the patient was prepped and draped in a sterile fashion. 20 cc of lidocaine was infiltrated and a one cm stab incision made. The Medtronic ILR, serial [id number] was inserted into the subcutaneous space. R waves measured 0.25 mV. Benzoin and steristrips were painted on the skin and the patient recovered in the usual manner.   Complications: none immediately.  Conclusion: successful insertion of a Medtronic ILR in a patient with cryptogenic stroke.  Mikle Bosworth.D.

## 2014-01-20 NOTE — Progress Notes (Signed)
  Echocardiogram Echocardiogram Transesophageal has been performed.  Philipp Deputy 01/20/2014, 4:57 PM

## 2014-01-20 NOTE — H&P (View-Only) (Signed)
Gabriel Tran Date of Birth:  Jun 30, 1955 [facility] [address] [place], [place]  [address] [phone]        Fax   [phone]   History of Present Illness: This 58 year old gentleman is seen for a post hospital office visit.  He has a past history of an aortic dissection in the setting of uncontrolled hypertension.  The dissection was in the ascending aorta.  He underwent emergent repair and his postop course was complicated by PEA arrest and pulmonary emboli.  He has a long history of hypertension. He presented to [facility] on 01/13/14 with evidence of a left brain stroke with right hemiparesis.  His initial blood pressure was in the range of 200/115.  He was given intravenous medication to bring his blood pressure down quickly after which she was given TPA.  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 The patient is scheduled for a TEE and for insertion of a LINQ recorder  on December 16.  Current Outpatient Prescriptions  Medication Sig Dispense Refill  . ALPRAZolam (XANAX) 0.5 MG tablet take 1 tablet by mouth every 6 hours if needed for anxiety 30 tablet 0  . augmented betamethasone dipropionate (DIPROLENE) 0.05 % ointment Apply 1 application topically daily as needed (Xerosis).    . clopidogrel (PLAVIX) 75 MG tablet Take 1 tablet (75 mg total) by mouth daily. 30 tablet 0  . diltiazem (CARDIZEM CD) 180 MG 24 hr capsule Take 1 capsule (180 mg total) by mouth daily. 90 capsule 3  . hydrochlorothiazide (HYDRODIURIL) 12.5 MG tablet Take 1 tablet (12.5 mg total) by mouth daily. 90 tablet 3  . losartan (COZAAR) 100 MG tablet Take 1 tablet (100 mg total) by mouth daily. 90 tablet 1  . metoprolol tartrate (LOPRESSOR) 25 MG tablet Take 1 tablet (25 mg total) by mouth 2 (two) times daily. 180 tablet 3  . Multiple Vitamin (MULTIVITAMIN WITH MINERALS) TABS tablet Take 1 tablet by mouth daily.    .  potassium chloride (K-DUR) 10 MEQ tablet take 1 tablet by mouth once daily 90 tablet 3  . rosuvastatin (CRESTOR) 20 MG tablet Take 1 tablet (20 mg total) by mouth daily at 6 PM. 30 tablet 0  . sertraline (ZOLOFT) 25 MG tablet Take 1 tablet (25 mg total) by mouth daily. 90 tablet 1   No current facility-administered medications for this visit.    Allergies  Allergen Reactions  . Lipitor [Atorvastatin]     aches    Patient Active Problem List   Diagnosis Date Noted  . Depression 01/18/2014  . Cerebral infarct   . Stroke 01/13/2014  . Advance care planning 08/18/2013  . Pulmonary embolism 12/04/2011  . ESRD (end stage renal disease) 12/04/2011  . A-fib 11/09/2011  . Aortic dissection 10/31/2011  . RBBB 10/30/2011  . HTN (hypertension), malignant 10/29/2011  . Routine general medical examination at a health care facility 09/07/2011  . Back pain 09/07/2011  . HYPERLIPIDEMIA 06/11/2007  . Benign hypertensive heart disease without heart failure 06/11/2007  . PSORIASIS 06/11/2007  . HYPERGLYCEMIA 06/11/2007    History  Smoking status  . Former Smoker  . Types: Cigarettes  . Quit date: 02/05/1990  Smokeless tobacco  . Not on file    Comment: Smoked for 20 years.    History  Alcohol Use  . 0.0 oz/week    Comment: 6 beers a week on average  Family History  Problem Relation Age of Onset  . Parkinsonism Mother   . Leukemia Father     AML at 7  . Colon polyps Brother   . Kidney disease Brother   . Heart disease Brother     CABG at age 63.  . Prostate cancer Neg Hx   . Colon cancer Neg Hx   . Heart disease Father     Angioplasty in his 61s, CABG age 13.    Review of Systems: Constitutional: no fever chills diaphoresis or fatigue or change in weight.  Head and neck: no hearing loss, no epistaxis, no photophobia or visual disturbance. Respiratory: No cough, shortness of breath or wheezing. Cardiovascular: No chest pain peripheral edema,  palpitations. Gastrointestinal: No abdominal distention, no abdominal pain, no change in bowel habits hematochezia or melena. Genitourinary: No dysuria, no frequency, no urgency, no nocturia. Musculoskeletal:No arthralgias, no back pain, no gait disturbance or myalgias. Neurological: No dizziness, no headaches, no numbness, no seizures, no syncope, no weakness, no tremors. Hematologic: No lymphadenopathy, no easy bruising. Psychiatric: No confusion, no hallucinations, no sleep disturbance.   Wt Readings from Last 3 Encounters:  01/18/14 219 lb (99.338 kg)  01/13/14 220 lb 14.4 oz (100.2 kg)  08/17/13 213 lb 8 oz (96.843 kg)    Physical Exam: Filed Vitals:   01/18/14 1218  BP: 148/82  Pulse: 59  The patient appears to be in no distress.  Head and neck exam reveals that the pupils are equal and reactive.  The extraocular movements are full.  There is no scleral icterus.  Mouth and pharynx are benign.  No lymphadenopathy.  No carotid bruits.  The jugular venous pressure is normal.  Thyroid is not enlarged or tender.  The patient still has a right facial droop   Chest is clear to percussion and auscultation.  No rales or rhonchi.  Expansion of the chest is symmetrical.  Heart reveals no abnormal lift or heave.  First and second heart sounds are normal.  There is no murmur gallop rub or click.  The aortic closure sound is accentuated.  There is no aortic insufficiency murmur heard  The abdomen is soft and nontender.  Bowel sounds are normoactive.  There is no hepatosplenomegaly or mass.  There are no abdominal bruits.  Extremities reveal no phlebitis or edema.  Pedal pulses are good.  There is no cyanosis or clubbing.  Neurologic exam is normal strength and no lateralizing weakness.  No sensory deficits.  Integument reveals no rash    Assessment / Plan: 1.  Recent left brain stroke felt to be probably embolic , successfully treated with TPA.   2.  Severe essential hypertension  3.   Prior history of a ascending aortic root dissection with emergent surgery . 4.  Prior history of PEA arrest and pulmonary emboli postoperatively  5.  Hypercholesterolemia  6.  Depression    disposition: For better blood pressure control we will increase losartan up to 100 mg daily.  The patient complains of being very depressed.  We will add generic Zoloft 25 mg daily. The patient will undergo TEE and insertion of Link on 01/20/14  Return here in 2 months for office visit EKG lipid panel hepatic function panel and basal metabolic panel. Encouraged patient to lose weight.

## 2014-01-20 NOTE — H&P (Signed)
History of Present Illness: This 58 year old gentleman is seen for a post hospital office visit. He has a past history of an aortic dissection in the setting of uncontrolled hypertension. The dissection was in the ascending aorta. He underwent emergent repair and his postop course was complicated by PEA arrest and pulmonary emboli. He has a long history of hypertension. He presented to [facility] on 01/13/14 with evidence of a left brain stroke with right hemiparesis. His initial blood pressure was in the range of 200/115. He was given intravenous medication to bring his blood pressure down quickly after which she was given TPA. 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 The patient is scheduled for a TEE and for insertion of a LINQ recorder on December 16. Current Outpatient Prescriptions  Medication Sig Dispense Refill  . ALPRAZolam (XANAX) 0.5 MG tablet take 1 tablet by mouth every 6 hours if needed for anxiety 30 tablet 0  . augmented betamethasone dipropionate (DIPROLENE) 0.05 % ointment Apply 1 application topically daily as needed (Xerosis).    . clopidogrel (PLAVIX) 75 MG tablet Take 1 tablet (75 mg total) by mouth daily. 30 tablet 0  . diltiazem (CARDIZEM CD) 180 MG 24 hr capsule Take 1 capsule (180 mg total) by mouth daily. 90 capsule 3  . hydrochlorothiazide (HYDRODIURIL) 12.5 MG tablet Take 1 tablet (12.5 mg total) by mouth daily. 90 tablet 3  . losartan (COZAAR) 100 MG tablet Take 1 tablet (100 mg total) by mouth daily. 90 tablet 1  . metoprolol tartrate (LOPRESSOR) 25 MG tablet Take 1 tablet (25 mg total) by mouth 2 (two) times daily. 180 tablet 3  . Multiple Vitamin (MULTIVITAMIN WITH MINERALS) TABS tablet Take 1 tablet by mouth daily.    . potassium chloride (K-DUR) 10 MEQ tablet take 1 tablet by mouth once daily 90 tablet 3  . rosuvastatin (CRESTOR) 20 MG tablet  Take 1 tablet (20 mg total) by mouth daily at 6 PM. 30 tablet 0  . sertraline (ZOLOFT) 25 MG tablet Take 1 tablet (25 mg total) by mouth daily. 90 tablet 1   No current facility-administered medications for this visit.    Allergies  Allergen Reactions  . Lipitor [Atorvastatin]     aches    Patient Active Problem List   Diagnosis Date Noted  . Depression 01/18/2014  . Cerebral infarct   . Stroke 01/13/2014  . Advance care planning 08/18/2013  . Pulmonary embolism 12/04/2011  . ESRD (end stage renal disease) 12/04/2011  . A-fib 11/09/2011  . Aortic dissection 10/31/2011  . RBBB 10/30/2011  . HTN (hypertension), malignant 10/29/2011  . Routine general medical examination at a health care facility 09/07/2011  . Back pain 09/07/2011  . HYPERLIPIDEMIA 06/11/2007  . Benign hypertensive heart disease without heart failure 06/11/2007  . PSORIASIS 06/11/2007  . HYPERGLYCEMIA 06/11/2007    History  Smoking status  . Former Smoker  . Types: Cigarettes  . Quit date: 02/05/1990  Smokeless tobacco  . Not on file    Comment: Smoked for 20 years.    History  Alcohol Use  . 0.0 oz/week    Comment: 6 beers a week on average    Family History  Problem Relation Age of Onset  . Parkinsonism Mother   . Leukemia Father     AML at 50  . Colon polyps Brother   . Kidney disease Brother   . Heart disease Brother     CABG  at age 76.  . Prostate cancer Neg Hx   . Colon cancer Neg Hx   . Heart disease Father     Angioplasty in his 43s, CABG age 39.    Review of Systems: Constitutional: no fever chills diaphoresis or fatigue or change in weight.  Head and neck: no hearing loss, no epistaxis, no photophobia or visual disturbance. Respiratory: No cough, shortness of breath or wheezing. Cardiovascular: No chest pain peripheral edema,  palpitations. Gastrointestinal: No abdominal distention, no abdominal pain, no change in bowel habits hematochezia or melena. Genitourinary: No dysuria, no frequency, no urgency, no nocturia. Musculoskeletal:No arthralgias, no back pain, no gait disturbance or myalgias. Neurological: No dizziness, no headaches, no numbness, no seizures, no syncope, no weakness, no tremors. Hematologic: No lymphadenopathy, no easy bruising. Psychiatric: No confusion, no hallucinations, no sleep disturbance.   Wt Readings from Last 3 Encounters:  01/18/14 219 lb (99.338 kg)  01/13/14 220 lb 14.4 oz (100.2 kg)  08/17/13 213 lb 8 oz (96.843 kg)    Physical Exam: Filed Vitals:   01/18/14 1218  BP: 148/82  Pulse: 59  The patient appears to be in no distress.  Head and neck exam reveals that the pupils are equal and reactive. The extraocular movements are full. There is no scleral icterus. Mouth and pharynx are benign. No lymphadenopathy. No carotid bruits. The jugular venous pressure is normal. Thyroid is not enlarged or tender. The patient still has a right facial droop   Chest is clear to percussion and auscultation. No rales or rhonchi. Expansion of the chest is symmetrical.  Heart reveals no abnormal lift or heave. First and second heart sounds are normal. There is no murmur gallop rub or click. The aortic closure sound is accentuated. There is no aortic insufficiency murmur heard  The abdomen is soft and nontender. Bowel sounds are normoactive. There is no hepatosplenomegaly or mass. There are no abdominal bruits.  Extremities reveal no phlebitis or edema. Pedal pulses are good. There is no cyanosis or clubbing.  Neurologic exam is normal strength and no lateralizing weakness. No sensory deficits.  Integument reveals no rash    Assessment / Plan: 1. Recent left brain stroke felt to be probably embolic , successfully treated with TPA.  2. Severe essential  hypertension  3. Prior history of a ascending aortic root dissection with emergent surgery . 4. Prior history of PEA arrest and pulmonary emboli postoperatively  5. Hypercholesterolemia  6. Depression   disposition: For better blood pressure control we will increase losartan up to 100 mg daily. The patient complains of being very depressed. We will add generic Zoloft 25 mg daily. The patient will undergo TEE and insertion of Link on 01/20/14  Return here in 2 months for office visit EKG lipid panel hepatic function panel and basal metabolic panel. Encouraged patient to lose weight.               Benign hypertensive heart disease without heart failure - Darlin Coco, MD at 01/18/2014 7:03 PM     Status: Written Related Problem: Benign hypertensive heart disease without heart failure   Expand All Collapse All   His blood pressure has improved significantly but is still above target. We will increase his losartan and up to 100 mg daily.            A-fib - Darlin Coco, MD at 01/18/2014 7:05 PM     Status: Written Related Problem: A-fib   Expand All Collapse All   The  patient will undergo a insertion of a Linq recorder to determine whether he is having unrecognized atrial fibrillation. If atrial fibrillation is found, he will need to be placed on long-term anticoagulation.            HYPERLIPIDEMIA - Darlin Coco, MD at 01/18/2014 7:06 PM     Status: Written Related Problem: HYPERLIPIDEMIA   Expand All Collapse All   The patient will continue on aggressive antilipid therapy. In the past he did not tolerate Lipitor. However he is tolerating Crestor       EP Attending  Patient seend and examined. Agree with above. He has a cryptogenic stroke and is need of cardiac monitoring. His TEE demonstrated no obvious cause of his stroke and he wishes to be monitored. Will plan to place ILR.   Mikle Bosworth.D.

## 2014-01-21 ENCOUNTER — Encounter: Payer: Self-pay | Admitting: Family Medicine

## 2014-01-22 ENCOUNTER — Telehealth: Payer: Self-pay | Admitting: Cardiology

## 2014-01-22 ENCOUNTER — Encounter: Payer: Self-pay | Admitting: Family Medicine

## 2014-01-22 NOTE — Telephone Encounter (Signed)
New Message  Pt requests to spaek with Rn about the results of his TEE. Please call back and discuss.

## 2014-01-22 NOTE — Telephone Encounter (Signed)
Dr Marlou Porch discussed results of TEE with pt.

## 2014-02-09 ENCOUNTER — Other Ambulatory Visit: Payer: Self-pay | Admitting: Physician Assistant

## 2014-02-16 ENCOUNTER — Telehealth: Payer: Self-pay

## 2014-02-16 ENCOUNTER — Other Ambulatory Visit: Payer: Self-pay | Admitting: *Deleted

## 2014-02-16 MED ORDER — CLOPIDOGREL BISULFATE 75 MG PO TABS
75.0000 mg | ORAL_TABLET | Freq: Every day | ORAL | Status: DC
Start: 2014-02-16 — End: 2014-04-11

## 2014-02-16 NOTE — Telephone Encounter (Signed)
Pt was started on Plavix while in hospital in 01/2014 and needs refill on med; pt had f/u appt with Dr Mare Ferrari on 01/18/14 and pt did not realize did not have refills on plavix. Transferred pt to Dr Bobbye Riggs office to ck on plavix refill. If needed pt will call our office back. Pt last saw Dr Damita Dunnings 08/17/13 and has f/u appt with Dr Mare Ferrari 03/24/14.

## 2014-02-18 ENCOUNTER — Telehealth: Payer: Self-pay | Admitting: Internal Medicine

## 2014-02-18 NOTE — Telephone Encounter (Signed)
02-18-14 PT RTN CALL FROM 02-09-14 TO SET UP LOOP WOUND CHECK, PER PT SITE IS HEALED AND WANTS TO AVOID HAVING TO COME IN, TOLD HIM WE STILL NEED TO HAVE A CLINICAL PERSON CK THE SITE PER KRISTIN, PT STATES ITS FINE AND WE MONITOR HIS DEVICE SO WE CAN JUST CK THAT, TOLD HIM IF ANY PROBLEMS TO CALL , PT VERBALIZED UNDERSTANDING/MT

## 2014-02-19 ENCOUNTER — Ambulatory Visit (INDEPENDENT_AMBULATORY_CARE_PROVIDER_SITE_OTHER): Payer: 59 | Admitting: *Deleted

## 2014-02-19 DIAGNOSIS — I639 Cerebral infarction, unspecified: Secondary | ICD-10-CM

## 2014-02-19 LAB — MDC_IDC_ENUM_SESS_TYPE_REMOTE
Date Time Interrogation Session: 20151216232538
MDC IDC SET ZONE DETECTION INTERVAL: 3000 ms
Zone Setting Detection Interval: 2000 ms
Zone Setting Detection Interval: 340 ms

## 2014-02-23 ENCOUNTER — Telehealth: Payer: Self-pay | Admitting: Family Medicine

## 2014-02-23 NOTE — Telephone Encounter (Signed)
Pt came in with forms that need to be filled out for insurance co.  Placing on lugene's desk.  The bes tnumber to reach pt is [phone] when they are finished

## 2014-02-23 NOTE — Progress Notes (Signed)
Loop recorder 

## 2014-02-23 NOTE — Telephone Encounter (Signed)
Placed in Dr. Duncan's In Box. 

## 2014-02-24 NOTE — Telephone Encounter (Signed)
Patient notified by telephone that completed form is up front ready for pickup. Copy sent to be scanned.

## 2014-02-24 NOTE — Telephone Encounter (Signed)
Form done. Thanks. 

## 2014-03-22 ENCOUNTER — Ambulatory Visit (INDEPENDENT_AMBULATORY_CARE_PROVIDER_SITE_OTHER): Payer: 59 | Admitting: *Deleted

## 2014-03-22 DIAGNOSIS — I639 Cerebral infarction, unspecified: Secondary | ICD-10-CM

## 2014-03-22 LAB — MDC_IDC_ENUM_SESS_TYPE_REMOTE

## 2014-03-24 ENCOUNTER — Ambulatory Visit (INDEPENDENT_AMBULATORY_CARE_PROVIDER_SITE_OTHER): Payer: 59 | Admitting: Cardiology

## 2014-03-24 ENCOUNTER — Encounter: Payer: Self-pay | Admitting: Cardiology

## 2014-03-24 ENCOUNTER — Encounter: Payer: Self-pay | Admitting: Internal Medicine

## 2014-03-24 VITALS — BP 138/80 | HR 51 | Ht 67.0 in | Wt 214.0 lb

## 2014-03-24 DIAGNOSIS — I639 Cerebral infarction, unspecified: Secondary | ICD-10-CM

## 2014-03-24 DIAGNOSIS — I119 Hypertensive heart disease without heart failure: Secondary | ICD-10-CM

## 2014-03-24 DIAGNOSIS — I10 Essential (primary) hypertension: Secondary | ICD-10-CM

## 2014-03-24 DIAGNOSIS — I7100 Dissection of unspecified site of aorta: Secondary | ICD-10-CM

## 2014-03-24 DIAGNOSIS — E7800 Pure hypercholesterolemia, unspecified: Secondary | ICD-10-CM

## 2014-03-24 LAB — HEPATIC FUNCTION PANEL
ALBUMIN: 4.1 g/dL (ref 3.5–5.2)
ALK PHOS: 64 U/L (ref 39–117)
ALT: 16 U/L (ref 0–53)
AST: 16 U/L (ref 0–37)
BILIRUBIN DIRECT: 0.1 mg/dL (ref 0.0–0.3)
Total Bilirubin: 0.7 mg/dL (ref 0.2–1.2)
Total Protein: 7.5 g/dL (ref 6.0–8.3)

## 2014-03-24 LAB — BASIC METABOLIC PANEL
BUN: 17 mg/dL (ref 6–23)
CHLORIDE: 96 meq/L (ref 96–112)
CO2: 25 meq/L (ref 19–32)
Calcium: 9.9 mg/dL (ref 8.4–10.5)
Creatinine, Ser: 1.42 mg/dL (ref 0.40–1.50)
GFR: 54.72 mL/min — AB (ref >60.00)
GLUCOSE: 106 mg/dL — AB (ref 70–99)
POTASSIUM: 4.4 meq/L (ref 3.5–5.1)
SODIUM: 130 meq/L — AB (ref 135–145)

## 2014-03-24 LAB — LIPID PANEL
CHOLESTEROL: 238 mg/dL — AB (ref 0–200)
HDL: 50.7 mg/dL (ref >39.00)
LDL Cholesterol: 167 mg/dL — ABNORMAL HIGH (ref 0–99)
NonHDL: 187.3
Total CHOL/HDL Ratio: 5
Triglycerides: 103 mg/dL (ref 0.0–149.0)
VLDL: 20.6 mg/dL (ref 0.0–40.0)

## 2014-03-24 MED ORDER — HYDROCHLOROTHIAZIDE 25 MG PO TABS: 25.0000 mg | ORAL_TABLET | Freq: Every day | ORAL | Status: DC

## 2014-03-24 NOTE — Patient Instructions (Addendum)
Will obtain labs today and call you with the results (lp/bmet/hfp)  INCREASE YOUR HYDROCHLOROTHIAZIDE TO 25 MG DAILY, NEW RX SENT TO PHARMACY   Discuss your Aspirin therapy with Dr Leonie Man  Call Dr Nils Pyle [phone] to further discuss aortic dissection   Your physician recommends that you schedule a follow-up appointment in: 1 month ov/bmet

## 2014-03-24 NOTE — Progress Notes (Signed)
`    Cardiology Office Note   Date:  03/24/2014   ID:  Gabriel Tran, DOB 08-15-1955, MRN [id number]  PCP:  Elsie Stain, MD  Cardiologist:   Darlin Coco, MD   Chief Complaint  Patient presents with  . 2 MO F/U VISIT    EKG PERFORMED      History of Present Illness: Gabriel Tran is a 59 y.o. male who presents for follow-up office visit.  This 59 year old gentleman is seen for a scheduled follow-up office visit. He has a past history of an aortic dissection in the setting of uncontrolled hypertension. The dissection was in the ascending aorta. He underwent emergent repair and his postop course was complicated by PEA arrest and pulmonary emboli. He has a long history of hypertension. He presented to [facility] on 01/13/14 with evidence of a left brain stroke with right hemiparesis. His initial blood pressure was in the range of 200/115. He was given intravenous medication to bring his blood pressure down quickly after which he was given TPA. 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  He was discharged on Plavix.  He states that he is also taking a daily aspirin . Since we last saw him he has had a TEE and a implantation of a loop recorder.  The TEE showed no evidence of any source of emboli.  The loop recorder was implanted by Dr. Lovena Le on 01/20/14 for evaluation of cryptogenic stroke.  So far there have been no arrhythmias detected. The patient states that his blood pressure is still running high frequently.  He is concerned because of his known aortic dissection which extends down to the renal artery. He states that he has not been taking his Crestor because it is too expensive. He has not been having any chest pain or shortness of breath.  He still has some clumsiness of his right hand from the previous residual from stroke.  He is due to see his neurologist Dr. Leonie Man next month.  He will confirm with Dr.  Leonie Man concerning his appropriate antiplatelet therapy for his previous stroke.  Past Medical History  Diagnosis Date  . Hypertension   . Hyperglycemia     a. A1C 5.9 in Sept 2013.  Marland Kitchen RBBB   . Renal insufficiency 08/28/2012  . PE (pulmonary embolism)   . CVA (cerebral infarction)   . Aortic dissection     Past Surgical History  Procedure Laterality Date  . Knee surgery      LEFT 1981  . Carpal tunnel release      2004 RIGHT  . Wisdom tooth extraction    . Thoracic aortic aneurysm repair  10/30/2011    Procedure: THORACIC ASCENDING ANEURYSM REPAIR (AAA);  Surgeon: Ivin Poot, MD;  Location: [place];  Service: Open Heart Surgery;  Laterality: N/A;  Ascending aortic dissection repair, arch reconstruction, aortic valve repair  . Insertion of dialysis catheter  11/06/2011    Procedure: INSERTION OF DIALYSIS CATHETER;  Surgeon: Ivin Poot, MD;  Location: [place];  Service: Thoracic;  Laterality: Right;  . Mediastinal exploration  11/06/2011    Procedure: MEDIASTINAL EXPLORATION;  Surgeon: Ivin Poot, MD;  Location: [place];  Service: Thoracic;  Laterality: N/A;  sternal closure; removal of wound vac  . Insertion of dialysis catheter  11/13/2011    Procedure: INSERTION OF DIALYSIS CATHETER;  Surgeon: Elam Dutch, MD;  Location: [place];  Service: Vascular;  Laterality: N/A;  Ultrasound guided.  . Cardiac valve replacement  10/2011    Aortic valve repaired  . Left heart catheterization with coronary angiogram N/A 10/30/2011    Procedure: LEFT HEART CATHETERIZATION WITH CORONARY ANGIOGRAM;  Surgeon: Burnell Blanks, MD;  Location: [facility] CATH LAB;  Service: Cardiovascular;  Laterality: N/A;  . Loop recorder implant N/A 01/20/2014    MDT LINQ implanted by Dr Lovena Le for cryptogenic stroke  . Tee without cardioversion N/A 01/20/2014    Procedure: TRANSESOPHAGEAL ECHOCARDIOGRAM (TEE);  Surgeon: Candee Furbish, MD;  Location: [facility] ENDOSCOPY;  Service: Cardiovascular;  Laterality: N/A;      Current Outpatient Prescriptions  Medication Sig Dispense Refill  . aspirin EC 325 MG tablet Take 325 mg by mouth daily.    Marland Kitchen augmented betamethasone dipropionate (DIPROLENE) 0.05 % ointment Apply 1 application topically daily as needed (Xerosis).    . clopidogrel (PLAVIX) 75 MG tablet Take 1 tablet (75 mg total) by mouth daily. 30 tablet 1  . diltiazem (CARDIZEM CD) 180 MG 24 hr capsule Take 1 capsule (180 mg total) by mouth daily. 90 capsule 3  . hydrochlorothiazide (HYDRODIURIL) 25 MG tablet Take 1 tablet (25 mg total) by mouth daily. 90 tablet 1  . losartan (COZAAR) 100 MG tablet Take 1 tablet (100 mg total) by mouth daily. 90 tablet 1  . metoprolol tartrate (LOPRESSOR) 25 MG tablet Take 1 tablet (25 mg total) by mouth 2 (two) times daily. 180 tablet 3  . Multiple Vitamin (MULTIVITAMIN WITH MINERALS) TABS tablet Take 1 tablet by mouth daily.    . potassium chloride (K-DUR) 10 MEQ tablet take 1 tablet by mouth once daily 90 tablet 3  . sertraline (ZOLOFT) 25 MG tablet Take 1 tablet (25 mg total) by mouth daily. 90 tablet 1  . ALPRAZolam (XANAX) 0.5 MG tablet take 1 tablet by mouth every 6 hours if needed for anxiety (Patient not taking: Reported on 03/24/2014) 30 tablet 0   No current facility-administered medications for this visit.    Allergies:   Lipitor    Social History:  The patient  reports that he quit smoking about 24 years ago. His smoking use included Cigarettes. He does not have any smokeless tobacco history on file. He reports that he drinks alcohol. He reports that he does not use illicit drugs.   Family History:  The patient's family history includes Colon polyps in his brother; Heart disease in his brother and father; Kidney disease in his brother; Leukemia in his father; Parkinsonism in his mother. There is no history of Prostate cancer or Colon cancer.    ROS:  Please see the history of present illness.   Otherwise, review of systems are positive for none.   All  other systems are reviewed and negative.    PHYSICAL EXAM: VS:  BP 138/80 mmHg  Pulse 51  Ht 5\' 7"  (1.702 m)  Wt 214 lb (97.07 kg)  BMI 33.51 kg/m2 , BMI Body mass index is 33.51 kg/(m^2). GEN: Well nourished, well developed, in no acute distress HEENT: normal Neck: no JVD, carotid bruits, or masses Cardiac: RRR; no murmurs, rubs, or gallops,no edema .  The aortic closure sound is crisp and there is no aortic insufficiency. Respiratory:  clear to auscultation bilaterally, normal work of breathing GI: soft, nontender, nondistended, + BS MS: no deformity or atrophy Skin: warm and dry, no rash Neuro:  Strength and sensation are intact Psych: euthymic mood, full affect   EKG:  EKG is ordered today. The  ekg ordered today demonstrates sinus bradycardia with first-degree AV block.  Right bundle branch block.  Old anterior infarct.   Recent Labs: 01/13/2014: ALT 18; Hemoglobin 15.6; Platelets 177 01/14/2014: BUN 16; Creatinine 1.07; Potassium 4.0; Sodium 136*    Lipid Panel    Component Value Date/Time   CHOL 226* 01/14/2014 0249   TRIG 108 01/14/2014 0249   HDL 56 01/14/2014 0249   CHOLHDL 4.0 01/14/2014 0249   VLDL 22 01/14/2014 0249   LDLCALC 148* 01/14/2014 0249   LDLDIRECT 142.8 08/31/2011 0835      Wt Readings from Last 3 Encounters:  03/24/14 214 lb (97.07 kg)  01/18/14 219 lb (99.338 kg)  01/13/14 220 lb 14.4 oz (100.2 kg)      Other studies Reviewed: Additional studies/ records that were reviewed today include: Discharge summary from [facility] on 01/16/15 Review of the above records demonstrates: Discharged on Plavix.   ASSESSMENT AND PLAN:  1. Recent left brain stroke felt to be probably embolic , successfully treated with TPA. Implantable loop recorder in place, no atrial fibrillation discovered so far.  TEE negative for embolic source. 2. Essential hypertension, not yet adequately controlled 3. Prior history of a ascending aortic root dissection with  emergent surgery .  Known descending aortic dissection 4. Prior history of PEA arrest and pulmonary emboli postoperatively  5. Hypercholesterolemia, not currently taking prescribed Crestor because of expense. Her checking his lipid panel today and then we will decide about alternatives therapy for his hypercholesterolemia. 6. Depression    Current medicines are reviewed at length with the patient today.  The patient does not have concerns regarding medicines.  The following changes have been made:  For better blood pressure control we will increase his HCTZ up to 25 mg daily.  We are checking lab work today  Labs/ tests ordered today include: Lipid panel hepatic function panel and basal metabolic panel   Orders Placed This Encounter  Procedures  . Lipid panel  . Hepatic function panel  . Basic metabolic panel  . Basic metabolic panel  . EKG 12-Lead   The patient is concerned about his descending aortic dissection.  Discussed that normally these are treated medically with blood pressure control etc. the patient would like to discuss possible surgical alternatives.  He has been reading on the Internet concerning stent graft etc. I offered to refer him to the vascular surgeons.  He would prefer to talk to his heart surgeon Dr. Prescott Gum.  We will set up an office visit with Dr. Prescott Gum to discuss pros and cons of surgical therapy further.  Disposition:   FU with Dr. Mare Ferrari in 2 months office visit and basal metabolic panel. We emphasize the importance of low-salt diet in his situation.   Signed, Darlin Coco, MD  03/24/2014 11:09 AM    [facility] [place], [place], [place]  [address] Phone: [phone]; Fax: [phone]

## 2014-03-24 NOTE — Progress Notes (Signed)
Loop recorder 

## 2014-03-26 ENCOUNTER — Other Ambulatory Visit: Payer: Self-pay

## 2014-03-26 DIAGNOSIS — E785 Hyperlipidemia, unspecified: Secondary | ICD-10-CM

## 2014-03-26 MED ORDER — SIMVASTATIN 10 MG PO TABS: 10.0000 mg | ORAL_TABLET | Freq: Every day | ORAL | Status: DC

## 2014-04-11 ENCOUNTER — Other Ambulatory Visit: Payer: Self-pay | Admitting: Cardiology

## 2014-04-16 ENCOUNTER — Encounter: Payer: Self-pay | Admitting: Internal Medicine

## 2014-04-20 ENCOUNTER — Encounter: Payer: Self-pay | Admitting: Neurology

## 2014-04-20 ENCOUNTER — Other Ambulatory Visit (INDEPENDENT_AMBULATORY_CARE_PROVIDER_SITE_OTHER): Payer: 59 | Admitting: *Deleted

## 2014-04-20 ENCOUNTER — Ambulatory Visit (INDEPENDENT_AMBULATORY_CARE_PROVIDER_SITE_OTHER): Payer: 59 | Admitting: Neurology

## 2014-04-20 ENCOUNTER — Ambulatory Visit (INDEPENDENT_AMBULATORY_CARE_PROVIDER_SITE_OTHER): Payer: 59 | Admitting: *Deleted

## 2014-04-20 VITALS — BP 124/79 | HR 62 | Ht 67.0 in | Wt 214.6 lb

## 2014-04-20 DIAGNOSIS — I6340 Cerebral infarction due to embolism of unspecified cerebral artery: Secondary | ICD-10-CM

## 2014-04-20 DIAGNOSIS — I639 Cerebral infarction, unspecified: Secondary | ICD-10-CM

## 2014-04-20 DIAGNOSIS — I10 Essential (primary) hypertension: Secondary | ICD-10-CM

## 2014-04-20 LAB — BASIC METABOLIC PANEL
BUN: 15 mg/dL (ref 6–23)
CALCIUM: 9.7 mg/dL (ref 8.4–10.5)
CO2: 30 meq/L (ref 19–32)
CREATININE: 1.34 mg/dL (ref 0.40–1.50)
Chloride: 95 mEq/L — ABNORMAL LOW (ref 96–112)
GFR: 58.49 mL/min — ABNORMAL LOW (ref >60.00)
Glucose, Bld: 108 mg/dL — ABNORMAL HIGH (ref 70–99)
Potassium: 3.8 mEq/L (ref 3.5–5.1)
Sodium: 130 mEq/L — ABNORMAL LOW (ref 135–145)

## 2014-04-20 NOTE — Patient Instructions (Signed)
I had a long d/w patient about his recent stroke, risk for recurrent stroke/TIAs, personally independently reviewed imaging studies and stroke evaluation results and answered questions.Continue Plavix  for secondary stroke prevention and maintain strict control of hypertension with blood pressure goal below 130/90, diabetes with hemoglobin A1c goal below 6.5% and lipids with LDL cholesterol goal below 100 mg/dL. I also advised the patient to eat a healthy diet with plenty of whole grains, cereals, fruits and vegetables, exercise regularly and maintain ideal body weight . Consider possible participation in the [place] trial if interested. He was given written information to review at home and to call us if he wants to participate. Followup in the future with me in  3 months or call earlier if necessary. Stroke Prevention Some medical conditions and behaviors are associated with an increased chance of having a stroke. You may prevent a stroke by making healthy choices and managing medical conditions. HOW CAN I REDUCE MY RISK OF HAVING A STROKE?   Stay physically active. Get at least 30 minutes of activity on most or all days.  Do not smoke. It may also be helpful to avoid exposure to secondhand smoke.  Limit alcohol use. Moderate alcohol use is considered to be:  No more than 2 drinks per day for men.  No more than 1 drink per day for nonpregnant women.  Eat healthy foods. This involves:  Eating 5 or more servings of fruits and vegetables a day.  Making dietary changes that address high blood pressure (hypertension), high cholesterol, diabetes, or obesity.  Manage your cholesterol levels.  Making food choices that are high in fiber and low in saturated fat, trans fat, and cholesterol may control cholesterol levels.  Take any prescribed medicines to control cholesterol as directed by your health care provider.  Manage your diabetes.  Controlling your carbohydrate and sugar intake is  recommended to manage diabetes.  Take any prescribed medicines to control diabetes as directed by your health care provider.  Control your hypertension.  Making food choices that are low in salt (sodium), saturated fat, trans fat, and cholesterol is recommended to manage hypertension.  Take any prescribed medicines to control hypertension as directed by your health care provider.  Maintain a healthy weight.  Reducing calorie intake and making food choices that are low in sodium, saturated fat, trans fat, and cholesterol are recommended to manage weight.  Stop drug abuse.  Avoid taking birth control pills.  Talk to your health care provider about the risks of taking birth control pills if you are over 42 years old, smoke, get migraines, or have ever had a blood clot.  Get evaluated for sleep disorders (sleep apnea).  Talk to your health care provider about getting a sleep evaluation if you snore a lot or have excessive sleepiness.  Take medicines only as directed by your health care provider.  For some people, aspirin or blood thinners (anticoagulants) are helpful in reducing the risk of forming abnormal blood clots that can lead to stroke. If you have the irregular heart rhythm of atrial fibrillation, you should be on a blood thinner unless there is a good reason you cannot take them.  Understand all your medicine instructions.  Make sure that other conditions (such as anemia or atherosclerosis) are addressed. SEEK IMMEDIATE MEDICAL CARE IF:   You have sudden weakness or numbness of the face, arm, or leg, especially on one side of the body.  Your face or eyelid droops to one side.  You  have sudden confusion.  You have trouble speaking (aphasia) or understanding.  You have sudden trouble seeing in one or both eyes.  You have sudden trouble walking.  You have dizziness.  You have a loss of balance or coordination.  You have a sudden, severe headache with no known  cause.  You have new chest pain or an irregular heartbeat. Any of these symptoms may represent a serious problem that is an emergency. Do not wait to see if the symptoms will go away. Get medical help at once. Call your local emergency services (911 in [place] Do not drive yourself to the hospital. Document Released: 03/01/2004 Document Revised: 06/08/2013 Document Reviewed: 07/25/2012 [facility] Patient Information 2015 [place], [place]. This information is not intended to replace advice given to you by your health care provider. Make sure you discuss any questions you have with your health care provider.

## 2014-04-20 NOTE — Progress Notes (Signed)
[facility] [address] [place]. [place] [address] [phone]       OFFICE FOLLOW-UP NOTE  [name] Date of Birth:  Sep 10, 1955 Medical Record Number:  [id number]   HPI: 69 year Caucasian male seen for first office follow-up visit following hospital admission for stroke on 01/13/2014. He presented with sudden onset of right facial droop, right hand weakness and right-sided visual deficits. CT head was negative for acute infarct but he presented within the time window and received IV tPA with moderate recovery of his symptoms. MRI scan shows left posterior frontal cortical embolic infarct and MRA of the brain and neck did not reveal any significant extracranial or intracranial large vessel stenosis. Patient had a transient history of transient postop atrial fibrillation in 2013 after aortic dissection repair surgery but Dr Crissie Sickles electrophysiologist was consulted and they felt this did not merit treatment with anticoagulation. Telemetry monitoring during the hospitalization did not reveal any atrial fibrillation and he subsequently had outpatient loop recorder implanted  01/20/14 and has not taken been found to have A. fib yet. He also had outpatient TEE done which showed no cardiac source of embolism. Patient was started on Plavix for stroke prevention which is tolerating fairly well with only minor bruising. He had elevated LDL cholesterol of 148 and history of statin intolerance but was started on Crestor 20 mg daily which he so far seems to be tolerating well. He states his blood pressure is well controlled on Cardizem and hydrochlorothiazide as well as metoprolol. He has noticed improvement in his speech and facial weakness but still have some diminished fine motor skills in his hand. He is also noticing increasing tremors which he had even prior to his strokes with physical activity now.  ROS:   14 system review of systems is positive for  ringing in the ears,  speech difficulty, easy bruising, tremors, back pain, joint pain, daytime sleepiness, depression, diminished fine motor skills and all other systems negative  PMH:  Past Medical History  Diagnosis Date  . Hypertension   . Hyperglycemia     a. A1C 5.9 in Sept 2013.  Marland Kitchen RBBB   . Renal insufficiency 08/28/2012  . PE (pulmonary embolism)   . CVA (cerebral infarction)   . Aortic dissection     Social History:  History   Social History  . Marital Status: Married    Spouse Name: N/A  . Number of Children: N/A  . Years of Education: N/A   Occupational History  . Not on file.   Social History Main Topics  . Smoking status: Former Smoker    Types: Cigarettes    Quit date: 02/05/1990  . Smokeless tobacco: Never Used     Comment: Smoked for 20 years.  . Alcohol Use: 0.0 oz/week    0 Standard drinks or equivalent per week     Comment: 6 beers a week on average  . Drug Use: No  . Sexual Activity: Not on file   Other Topics Concern  . Not on file   Social History Narrative   Works at [employer], some overseas travel, some [employer] travel   Married 2000, 2 adult step kids     Medications:   Current Outpatient Prescriptions on File Prior to Visit  Medication Sig Dispense Refill  . ALPRAZolam (XANAX) 0.5 MG tablet take 1 tablet by mouth every 6 hours if needed for anxiety 30 tablet 0  . aspirin EC 325 MG tablet Take 325 mg  by mouth daily.    Marland Kitchen augmented betamethasone dipropionate (DIPROLENE) 0.05 % ointment Apply 1 application topically daily as needed (Xerosis).    . clopidogrel (PLAVIX) 75 MG tablet take 1 tablet by mouth once daily 30 tablet 6  . diltiazem (CARDIZEM CD) 180 MG 24 hr capsule Take 1 capsule (180 mg total) by mouth daily. 90 capsule 3  . hydrochlorothiazide (HYDRODIURIL) 25 MG tablet Take 1 tablet (25 mg total) by mouth daily. 90 tablet 1  . losartan (COZAAR) 100 MG tablet Take 1 tablet (100 mg total) by mouth daily. 90 tablet 1  . metoprolol tartrate (LOPRESSOR)  25 MG tablet Take 1 tablet (25 mg total) by mouth 2 (two) times daily. 180 tablet 3  . Multiple Vitamin (MULTIVITAMIN WITH MINERALS) TABS tablet Take 1 tablet by mouth daily.    . potassium chloride (K-DUR) 10 MEQ tablet take 1 tablet by mouth once daily 90 tablet 3  . simvastatin (ZOCOR) 10 MG tablet Take 1 tablet (10 mg total) by mouth at bedtime. 90 tablet 3   No current facility-administered medications on file prior to visit.    Allergies:   Allergies  Allergen Reactions  . Lipitor [Atorvastatin]     aches    Physical Exam General: well developed, well nourished middle aged Caucasian male, seated, in no evident distress Head: head normocephalic and atraumatic.  Neck: supple with no carotid or supraclavicular bruits Cardiovascular: regular rate and rhythm, no murmurs Musculoskeletal: no deformity Skin:  no rash/petichiae Vascular:  Normal pulses all extremities Filed Vitals:   04/20/14 1010  BP: 124/79  Pulse: 62   Neurologic Exam Mental Status: Awake and fully alert. Oriented to place and time. Recent and remote memory intact. Attention span, concentration and fund of knowledge appropriate. Mood and affect appropriate.  Cranial Nerves: Fundoscopic exam reveals sharp disc margins. Pupils equal, briskly reactive to light. Extraocular movements full without nystagmus. Visual fields full to confrontation. Hearing intact. Facial sensation intact. Right lower face weakness mild.  Tongue, palate moves normally and symmetrically.  Motor: Normal bulk and tone. Normal strength in all tested extremity muscles. Diminished fine finger movements on the right. Orbits left over right approximately. Mild weakness of right grip. Sensory.: intact to touch ,pinprick .position and vibratory sensation.  Coordination: Rapid alternating movements normal in all extremities. Finger-to-nose and heel-to-shin performed accurately bilaterally. Gait and Station: Arises from chair without difficulty. Stance  is normal. Gait demonstrates normal stride length and balance . Able to heel, toe and tandem walk without difficulty.  Reflexes: 1+ and symmetric. Toes downgoing.   NIHSS  1 Modified Rankin  1   ASSESSMENT: 69 year Caucasian male with embolic left middle cerebral artery branch infarct in December 2015 without definite identified source. Vascular risk factors of hypertension and borderline diabetes. Remote history of transient postoperative atrial fibrillation following aortic aneurysm repair in 2013 not felt to be indication for long-term anticoagulation per cardiology     PLAN: I had a long d/w patient about his recent stroke, risk for recurrent stroke/TIAs, personally independently reviewed imaging studies and stroke evaluation results and answered questions.Continue Plavix  for secondary stroke prevention and maintain strict control of hypertension with blood pressure goal below 130/90, diabetes with hemoglobin A1c goal below 6.5% and lipids with LDL cholesterol goal below 100 mg/dL. I also advised the patient to eat a healthy diet with plenty of whole grains, cereals, fruits and vegetables, exercise regularly and maintain ideal body weight . Consider possible participation in the [place]  trial if interested. He was given written information to review at home and to call us if he wants to participate. Followup in the future with me in  3 months or call earlier if necessary.    Note: This document was prepared with digital dictation and possible smart phrase technology. Any transcriptional errors that result from this process are unintentional

## 2014-04-22 ENCOUNTER — Other Ambulatory Visit: Payer: 59

## 2014-04-22 NOTE — Progress Notes (Signed)
Loop recorder 

## 2014-04-23 ENCOUNTER — Telehealth: Payer: Self-pay | Admitting: *Deleted

## 2014-04-23 DIAGNOSIS — Z79899 Other long term (current) drug therapy: Secondary | ICD-10-CM

## 2014-04-23 NOTE — Telephone Encounter (Signed)
-----   Message from Darlin Coco, MD sent at 04/21/2014  9:41 PM EDT ----- Labs okay except for low Na. Decrease HCTZ to 12.5 mg daily. Recheck BMET in 1 month

## 2014-04-26 NOTE — Telephone Encounter (Signed)
i

## 2014-04-30 NOTE — Telephone Encounter (Signed)
Patient advised on 04/26/14 Left message today for follow up lab to be done in 1 month

## 2014-05-03 NOTE — Telephone Encounter (Signed)
Labs scheduled  

## 2014-05-03 NOTE — Telephone Encounter (Signed)
F/u ° ° °Pt returning your call °

## 2014-05-05 ENCOUNTER — Other Ambulatory Visit: Payer: Self-pay | Admitting: Physician Assistant

## 2014-05-06 LAB — MDC_IDC_ENUM_SESS_TYPE_REMOTE
MDC IDC SESS DTM: 20160322010502
MDC IDC SET ZONE DETECTION INTERVAL: 340 ms
Zone Setting Detection Interval: 2000 ms
Zone Setting Detection Interval: 3000 ms

## 2014-05-09 ENCOUNTER — Other Ambulatory Visit: Payer: Self-pay | Admitting: Physician Assistant

## 2014-05-20 ENCOUNTER — Ambulatory Visit (INDEPENDENT_AMBULATORY_CARE_PROVIDER_SITE_OTHER): Payer: 59 | Admitting: *Deleted

## 2014-05-20 DIAGNOSIS — I639 Cerebral infarction, unspecified: Secondary | ICD-10-CM | POA: Diagnosis not present

## 2014-05-21 ENCOUNTER — Encounter: Payer: Self-pay | Admitting: Internal Medicine

## 2014-05-21 NOTE — Progress Notes (Signed)
Loop recorder 

## 2014-05-24 ENCOUNTER — Telehealth: Payer: Self-pay | Admitting: *Deleted

## 2014-05-24 NOTE — Telephone Encounter (Signed)
Spoke to the pt on the phone and was able to get his follow up appt rescheduled. He thanked me

## 2014-05-25 ENCOUNTER — Other Ambulatory Visit (INDEPENDENT_AMBULATORY_CARE_PROVIDER_SITE_OTHER): Payer: 59 | Admitting: *Deleted

## 2014-05-25 DIAGNOSIS — Z79899 Other long term (current) drug therapy: Secondary | ICD-10-CM

## 2014-05-25 DIAGNOSIS — E785 Hyperlipidemia, unspecified: Secondary | ICD-10-CM

## 2014-05-25 LAB — BASIC METABOLIC PANEL
BUN: 15 mg/dL (ref 6–23)
CALCIUM: 9.5 mg/dL (ref 8.4–10.5)
CO2: 27 meq/L (ref 19–32)
Chloride: 99 mEq/L (ref 96–112)
Creatinine, Ser: 1.16 mg/dL (ref 0.40–1.50)
GFR: 69.07 mL/min (ref >60.00)
GLUCOSE: 99 mg/dL (ref 70–99)
POTASSIUM: 4 meq/L (ref 3.5–5.1)
Sodium: 132 mEq/L — ABNORMAL LOW (ref 135–145)

## 2014-05-25 LAB — HEPATIC FUNCTION PANEL
ALBUMIN: 4.2 g/dL (ref 3.5–5.2)
ALT: 18 U/L (ref 0–53)
AST: 18 U/L (ref 0–37)
Alkaline Phosphatase: 60 U/L (ref 39–117)
Bilirubin, Direct: 0.1 mg/dL (ref 0.0–0.3)
Total Bilirubin: 0.6 mg/dL (ref 0.2–1.2)
Total Protein: 7.4 g/dL (ref 6.0–8.3)

## 2014-05-25 LAB — LIPID PANEL
CHOL/HDL RATIO: 3
Cholesterol: 185 mg/dL (ref 0–200)
HDL: 55.9 mg/dL (ref >39.00)
LDL Cholesterol: 111 mg/dL — ABNORMAL HIGH (ref 0–99)
NonHDL: 129.1
Triglycerides: 93 mg/dL (ref 0.0–149.0)
VLDL: 18.6 mg/dL (ref 0.0–40.0)

## 2014-05-25 NOTE — Progress Notes (Signed)
Quick Note:  Please report to patient. The recent labs are stable. Continue same medication and careful diet. The cholesterol and LDL are much better. The liver tests are normal. ______

## 2014-06-06 ENCOUNTER — Other Ambulatory Visit: Payer: Self-pay | Admitting: Physician Assistant

## 2014-06-18 ENCOUNTER — Ambulatory Visit (INDEPENDENT_AMBULATORY_CARE_PROVIDER_SITE_OTHER): Payer: 59 | Admitting: *Deleted

## 2014-06-18 DIAGNOSIS — I639 Cerebral infarction, unspecified: Secondary | ICD-10-CM | POA: Diagnosis not present

## 2014-06-22 LAB — CUP PACEART REMOTE DEVICE CHECK
Date Time Interrogation Session: 20160325044625
MDC IDC SET ZONE DETECTION INTERVAL: 2000 ms
Zone Setting Detection Interval: 3000 ms
Zone Setting Detection Interval: 340 ms

## 2014-06-25 NOTE — Progress Notes (Signed)
Loop recorder 

## 2014-07-01 ENCOUNTER — Encounter: Payer: Self-pay | Admitting: Internal Medicine

## 2014-07-07 LAB — CUP PACEART REMOTE DEVICE CHECK: Date Time Interrogation Session: 20160601204457

## 2014-07-08 ENCOUNTER — Other Ambulatory Visit: Payer: Self-pay | Admitting: Physician Assistant

## 2014-07-08 ENCOUNTER — Other Ambulatory Visit: Payer: Self-pay | Admitting: Family Medicine

## 2014-07-09 NOTE — Telephone Encounter (Signed)
He should be taking 100 mg daily

## 2014-07-09 NOTE — Telephone Encounter (Signed)
Electronic refill request. Last Filled:    30 tablet 0 RF on 01/18/2014  Due for CPE in July.  Please advise.

## 2014-07-10 NOTE — Telephone Encounter (Signed)
Please call in.  Schedule CPE when possible.  Thanks.

## 2014-07-12 NOTE — Telephone Encounter (Signed)
Medication phoned to pharmacy. Patient advised to schedule CPE.

## 2014-07-19 ENCOUNTER — Ambulatory Visit (INDEPENDENT_AMBULATORY_CARE_PROVIDER_SITE_OTHER): Payer: 59 | Admitting: *Deleted

## 2014-07-19 DIAGNOSIS — I639 Cerebral infarction, unspecified: Secondary | ICD-10-CM

## 2014-07-20 NOTE — Progress Notes (Signed)
Loop recorder 

## 2014-07-26 ENCOUNTER — Ambulatory Visit: Payer: 59 | Admitting: Neurology

## 2014-07-26 ENCOUNTER — Other Ambulatory Visit: Payer: Self-pay | Admitting: Cardiology

## 2014-07-26 ENCOUNTER — Other Ambulatory Visit: Payer: Self-pay | Admitting: Physician Assistant

## 2014-07-26 ENCOUNTER — Encounter: Payer: Self-pay | Admitting: Internal Medicine

## 2014-07-29 LAB — CUP PACEART REMOTE DEVICE CHECK: Date Time Interrogation Session: 20160623113019

## 2014-08-17 ENCOUNTER — Encounter: Payer: Self-pay | Admitting: Internal Medicine

## 2014-08-18 ENCOUNTER — Ambulatory Visit (INDEPENDENT_AMBULATORY_CARE_PROVIDER_SITE_OTHER): Payer: 59 | Admitting: *Deleted

## 2014-08-18 DIAGNOSIS — I639 Cerebral infarction, unspecified: Secondary | ICD-10-CM

## 2014-08-19 NOTE — Progress Notes (Signed)
Loop recorder 

## 2014-08-26 ENCOUNTER — Ambulatory Visit: Payer: Self-pay | Admitting: Neurology

## 2014-08-27 LAB — CUP PACEART REMOTE DEVICE CHECK: Date Time Interrogation Session: 20160722143826

## 2014-09-16 ENCOUNTER — Encounter: Payer: Self-pay | Admitting: Internal Medicine

## 2014-09-17 ENCOUNTER — Ambulatory Visit (INDEPENDENT_AMBULATORY_CARE_PROVIDER_SITE_OTHER): Payer: 59

## 2014-09-17 DIAGNOSIS — I639 Cerebral infarction, unspecified: Secondary | ICD-10-CM | POA: Diagnosis not present

## 2014-09-21 NOTE — Progress Notes (Signed)
Loop recorder 

## 2014-09-23 ENCOUNTER — Other Ambulatory Visit: Payer: Self-pay | Admitting: Family Medicine

## 2014-09-23 ENCOUNTER — Other Ambulatory Visit (INDEPENDENT_AMBULATORY_CARE_PROVIDER_SITE_OTHER): Payer: 59

## 2014-09-23 DIAGNOSIS — R739 Hyperglycemia, unspecified: Secondary | ICD-10-CM

## 2014-09-23 LAB — BASIC METABOLIC PANEL
BUN: 17 mg/dL (ref 6–23)
CHLORIDE: 96 meq/L (ref 96–112)
CO2: 32 mEq/L (ref 19–32)
Calcium: 10.2 mg/dL (ref 8.4–10.5)
Creatinine, Ser: 1.29 mg/dL (ref 0.40–1.50)
GFR: 61.03 mL/min (ref >60.00)
Glucose, Bld: 107 mg/dL — ABNORMAL HIGH (ref 70–99)
POTASSIUM: 4.1 meq/L (ref 3.5–5.1)
SODIUM: 133 meq/L — AB (ref 135–145)

## 2014-09-23 LAB — HEMOGLOBIN A1C: Hgb A1c MFr Bld: 5.6 % (ref 4.6–6.5)

## 2014-09-27 ENCOUNTER — Other Ambulatory Visit: Payer: 59

## 2014-10-01 ENCOUNTER — Other Ambulatory Visit: Payer: Self-pay | Admitting: Family Medicine

## 2014-10-01 ENCOUNTER — Ambulatory Visit (INDEPENDENT_AMBULATORY_CARE_PROVIDER_SITE_OTHER): Payer: 59 | Admitting: Family Medicine

## 2014-10-01 ENCOUNTER — Encounter: Payer: Self-pay | Admitting: Family Medicine

## 2014-10-01 VITALS — BP 144/72 | HR 79 | Temp 98.2°F | Ht 67.75 in | Wt 221.0 lb

## 2014-10-01 DIAGNOSIS — E785 Hyperlipidemia, unspecified: Secondary | ICD-10-CM | POA: Diagnosis not present

## 2014-10-01 DIAGNOSIS — I639 Cerebral infarction, unspecified: Secondary | ICD-10-CM | POA: Diagnosis not present

## 2014-10-01 DIAGNOSIS — R739 Hyperglycemia, unspecified: Secondary | ICD-10-CM

## 2014-10-01 DIAGNOSIS — Z23 Encounter for immunization: Secondary | ICD-10-CM | POA: Diagnosis not present

## 2014-10-01 DIAGNOSIS — Z0000 Encounter for general adult medical examination without abnormal findings: Secondary | ICD-10-CM | POA: Diagnosis not present

## 2014-10-01 DIAGNOSIS — I10 Essential (primary) hypertension: Secondary | ICD-10-CM | POA: Diagnosis not present

## 2014-10-01 DIAGNOSIS — Z119 Encounter for screening for infectious and parasitic diseases, unspecified: Secondary | ICD-10-CM

## 2014-10-01 DIAGNOSIS — Z7189 Other specified counseling: Secondary | ICD-10-CM

## 2014-10-01 MED ORDER — DILTIAZEM HCL ER COATED BEADS 180 MG PO CP24: 180.0000 mg | ORAL_CAPSULE | Freq: Every day | ORAL | Status: DC

## 2014-10-01 MED ORDER — HYDROCHLOROTHIAZIDE 25 MG PO TABS: 25.0000 mg | ORAL_TABLET | ORAL | Status: DC

## 2014-10-01 MED ORDER — POTASSIUM CHLORIDE ER 10 MEQ PO TBCR
10.0000 meq | EXTENDED_RELEASE_TABLET | Freq: Every day | ORAL | Status: DC
Start: 2014-10-01 — End: 2014-10-14

## 2014-10-01 MED ORDER — CLOPIDOGREL BISULFATE 75 MG PO TABS: 75.0000 mg | ORAL_TABLET | Freq: Every day | ORAL | Status: DC

## 2014-10-01 MED ORDER — METOPROLOL TARTRATE 25 MG PO TABS: 37.5000 mg | ORAL_TABLET | Freq: Two times a day (BID) | ORAL | Status: DC

## 2014-10-01 MED ORDER — SIMVASTATIN 10 MG PO TABS: 10.0000 mg | ORAL_TABLET | Freq: Every day | ORAL | Status: DC

## 2014-10-01 MED ORDER — LOSARTAN POTASSIUM 100 MG PO TABS: 100.0000 mg | ORAL_TABLET | Freq: Every day | ORAL | Status: DC

## 2014-10-01 NOTE — Progress Notes (Addendum)
Pre visit review using our clinic review tool, if applicable. No additional management support is needed unless otherwise documented below in the visit note.  CPE- See plan.  Routine anticipatory guidance given to patient.  See health maintenance. Tetanus 2007 Flu encouraged Shingles and PNA shot not due.  Colonoscopy 2012 Prostate cancer screening and PSA options (with potential risks and benefits of testing vs not testing) were discussed along with recent recs/guidelines. He declined testing PSA at this point. Living will d/w pt. Wife designated if patient were incapacitated.  Diet and exercise d/w pt. Has a treadmill at home. Encouraged diet and moderate exercise.   HIV and HCV screening d/w pt. He opts in.   CVA hx.  No residual sx from CVA other than occ delay in some multi syllable words.  No other focal changes.  He has neuro f/u pending.  His handwriting is back to normal.    I asked him to follow up with cards re: his prev valve repair.  No episodes of AF noted by patient.    Hyperglycemia d/w pt.  A1c still <6.  Mild inc in sugar . D/w pt about labs and diet and weight.    Hypertension:  BP usually >140/70s at home.   Using medication without problems or lightheadedness: no Chest pain with exertion: no Edema: occ mild BLE edema "a sock line."  Noted to be worse with high salt diet.  Encouraged low salt diet.  Short of breath:no  Elevated Cholesterol: Using medications without problems:yes Muscle aches: no - he is able to tolerate the current dose of current statin.  Diet compliance:encouraged Exercise: encouraged moderate exercise.    PMH and SH reviewed  Meds, vitals, and allergies reviewed.   ROS: See HPI.  Otherwise negative.    GEN: nad, alert and oriented HEENT: mucous membranes moist NECK: supple w/o LA CV: rrr. Faint systolic murmur noted.  PULM: ctab, no inc wob ABD: soft, +bs EXT: no edema SKIN: no acute rash but AK noted in midline scalp.  Treated  with x3 with liq N2, tolerated well.

## 2014-10-01 NOTE — Patient Instructions (Addendum)
I would get a flu shot each fall.   Call cardiology about getting a follow up appointment.   Go to the lab on the way out.  We'll contact you with your lab report. Up the metoprolol to 1.5 tabs twice a day.   If BP is still usually >140/>90, then update me.  Take care.  Glad to see you.

## 2014-10-02 LAB — HIV ANTIBODY (ROUTINE TESTING W REFLEX): HIV: NONREACTIVE

## 2014-10-02 LAB — HEPATITIS C ANTIBODY: HCV AB: REACTIVE — AB

## 2014-10-03 NOTE — Assessment & Plan Note (Signed)
Up the metoprolol to 1.5 tabs twice a day.  If BP is still usually >140/>90, then he'll update me.

## 2014-10-03 NOTE — Assessment & Plan Note (Signed)
Encouraged weight loss and diet/exercise.  Sugar up but A1c still low. Tolerating current dose of statin.  See notes re: BP.  He'll f/u with neuro.

## 2014-10-03 NOTE — Assessment & Plan Note (Signed)
Encouraged weight loss and diet/exercise.  Sugar up but A1c still low.

## 2014-10-03 NOTE — Assessment & Plan Note (Signed)
Routine anticipatory guidance given to patient.  See health maintenance. Tetanus 2007 Flu encouraged Shingles and PNA shot not due.  Colonoscopy 2012 Prostate cancer screening and PSA options (with potential risks and benefits of testing vs not testing) were discussed along with recent recs/guidelines. He declined testing PSA at this point. Living will d/w pt. Wife designated if patient were incapacitated.  Diet and exercise d/w pt. Has a treadmill at home. Encouraged diet and moderate exercise.   HIV and HCV screening d/w pt. He opts in.

## 2014-10-03 NOTE — Assessment & Plan Note (Signed)
Tolerating current dose of statin. Continue as is with med. Need work on diet and weight.

## 2014-10-04 LAB — HEPATITIS C RNA QUANTITATIVE: HCV QUANT: NOT DETECTED [IU]/mL (ref <15)

## 2014-10-04 LAB — CUP PACEART REMOTE DEVICE CHECK: Date Time Interrogation Session: 20160829092922

## 2014-10-07 ENCOUNTER — Encounter: Payer: Self-pay | Admitting: Family Medicine

## 2014-10-07 ENCOUNTER — Ambulatory Visit (INDEPENDENT_AMBULATORY_CARE_PROVIDER_SITE_OTHER): Payer: 59 | Admitting: Family Medicine

## 2014-10-07 ENCOUNTER — Ambulatory Visit (INDEPENDENT_AMBULATORY_CARE_PROVIDER_SITE_OTHER): Payer: 59 | Admitting: Neurology

## 2014-10-07 ENCOUNTER — Encounter: Payer: Self-pay | Admitting: Neurology

## 2014-10-07 VITALS — BP 144/78 | HR 50 | Temp 98.3°F | Wt 220.0 lb

## 2014-10-07 VITALS — BP 149/82 | HR 60 | Ht 67.0 in | Wt 223.4 lb

## 2014-10-07 DIAGNOSIS — E785 Hyperlipidemia, unspecified: Secondary | ICD-10-CM

## 2014-10-07 DIAGNOSIS — L570 Actinic keratosis: Secondary | ICD-10-CM | POA: Diagnosis not present

## 2014-10-07 DIAGNOSIS — I6522 Occlusion and stenosis of left carotid artery: Secondary | ICD-10-CM | POA: Diagnosis not present

## 2014-10-07 DIAGNOSIS — Z23 Encounter for immunization: Secondary | ICD-10-CM

## 2014-10-07 MED ORDER — SIMVASTATIN 10 MG PO TABS: 20.0000 mg | ORAL_TABLET | Freq: Every day | ORAL | Status: DC

## 2014-10-07 NOTE — Patient Instructions (Signed)
I had a long d/w patient about his remote stroke, risk for recurrent stroke/TIAs, personally independently reviewed imaging studies and stroke evaluation results and answered questions.Continue Plavix 75 mg alone and discontinue aspirin as I do not see any indication for long-term dual antiplatelets therapy for secondary stroke prevention and maintain strict control of hypertension with blood pressure goal below 130/90, diabetes with hemoglobin A1c goal below 6.5% and lipids with LDL cholesterol goal below 70 mg/dL. increase Zocor to 20 mg daily and check lipid profile and LFTs in 2 months. I also advised the patient to eat a healthy diet with plenty of whole grains, cereals, fruits and vegetables, exercise regularly and maintain ideal body weight. I have advised him to keep a blood pressure log and discuss with primary physician or cardiologist increasing medications. Check screening follow-up carotid ultrasound study. Followup in the future with me in one year or call earlier if necessary.

## 2014-10-07 NOTE — Patient Instructions (Signed)
Continue with your meds as is.  Update me as needed.  Take care.  Glad to see you.

## 2014-10-07 NOTE — Progress Notes (Signed)
[facility] [address] [place]. [place] [address] [phone]       OFFICE FOLLOW-UP NOTE  [name] Date of Birth:  March 18, 1955 Medical Record Number:  [id number]   HPI: 66 year Caucasian male seen for first office follow-up visit following hospital admission for stroke on 01/13/2014. He presented with sudden onset of right facial droop, right hand weakness and right-sided visual deficits. CT head was negative for acute infarct but he presented within the time window and received IV tPA with moderate recovery of his symptoms. MRI scan shows left posterior frontal cortical embolic infarct and MRA of the brain and neck did not reveal any significant extracranial or intracranial large vessel stenosis. Patient had a transient history of transient postop atrial fibrillation in 2013 after aortic dissection repair surgery but Dr Crissie Sickles electrophysiologist was consulted and they felt this did not merit treatment with anticoagulation. Telemetry monitoring during the hospitalization did not reveal any atrial fibrillation and he subsequently had outpatient loop recorder implanted  01/20/14 and has not taken been found to have A. fib yet. He also had outpatient TEE done which showed no cardiac source of embolism. Patient was started on Plavix for stroke prevention which is tolerating fairly well with only minor bruising. He had elevated LDL cholesterol of 148 and history of statin intolerance but was started on Crestor 20 mg daily which he so far seems to be tolerating well. He states his blood pressure is well controlled on Cardizem and hydrochlorothiazide as well as metoprolol. He has noticed improvement in his speech and facial weakness but still have some diminished fine motor skills in his hand. He is also noticing increasing tremors which he had even prior to his strokes with physical activity now. Update 10/07/2014 : He returns for follow-up after last visit in March 2016.  Continues to do well without recurrent stroke or TIA symptoms. He states his blood pressure is well controlled though it is slightly elevated at 149/82 in office today. He states his last lipid profile was checked on 05/25/14 and LDL was 111 and diagnosis and total cholesterol 185. He is on Zocor 10 mg daily. He is tolerating aspirin well without bleeding bruising or other side effects. He for unclear reason remains on both aspirin and Plavix. He has no new complaints today ROS:   14 system review of systems is positive for  weight gain, double vision and all other systems negative  PMH:  Past Medical History  Diagnosis Date  . Hypertension   . Hyperglycemia     a. A1C 5.9 in Sept 2013.  Marland Kitchen RBBB   . Renal insufficiency 08/28/2012  . PE (pulmonary embolism)   . CVA (cerebral infarction)   . Aortic dissection     Social History:  Social History   Social History  . Marital Status: Married    Spouse Name: N/A  . Number of Children: N/A  . Years of Education: N/A   Occupational History  . Not on file.   Social History Main Topics  . Smoking status: Former Smoker    Types: Cigarettes    Quit date: 02/05/1990  . Smokeless tobacco: Never Used     Comment: Smoked for 20 years.  . Alcohol Use: 0.0 oz/week    0 Standard drinks or equivalent per week     Comment: 6 beers a week on average  . Drug Use: No  . Sexual Activity: Not on file   Other Topics Concern  .  Not on file   Social History Narrative   Prev worked at [employer] with frequent long travel.    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    Medications:   Current Outpatient Prescriptions on File Prior to Visit  Medication Sig Dispense Refill  . ALPRAZolam (XANAX) 0.5 MG tablet take 1 tablet by mouth every 6 hours if needed anxiety 30 tablet 0  . augmented betamethasone dipropionate (DIPROLENE) 0.05 % ointment Apply 1 application topically daily as needed (Xerosis).    . clopidogrel  (PLAVIX) 75 MG tablet Take 1 tablet (75 mg total) by mouth daily. 90 tablet 3  . diltiazem (CARDIZEM CD) 180 MG 24 hr capsule Take 1 capsule (180 mg total) by mouth daily. 90 capsule 3  . hydrochlorothiazide (HYDRODIURIL) 25 MG tablet Take 1 tablet (25 mg total) by mouth as directed. 90 tablet 3  . losartan (COZAAR) 100 MG tablet Take 1 tablet (100 mg total) by mouth daily. 90 tablet 3  . metoprolol tartrate (LOPRESSOR) 25 MG tablet Take 1.5 tablets (37.5 mg total) by mouth 2 (two) times daily. 270 tablet 3  . Multiple Vitamin (MULTIVITAMIN WITH MINERALS) TABS tablet Take 1 tablet by mouth daily.    . potassium chloride (K-DUR) 10 MEQ tablet Take 1 tablet (10 mEq total) by mouth daily. 90 tablet 3   No current facility-administered medications on file prior to visit.    Allergies:   Allergies  Allergen Reactions  . Lipitor [Atorvastatin]     aches    Physical Exam General: well developed, well nourished middle aged Caucasian male, seated, in no evident distress Head: head normocephalic and atraumatic.  Neck: supple with no carotid or supraclavicular bruits Cardiovascular: regular rate and rhythm, no murmurs Musculoskeletal: no deformity Skin:  no rash/petichiae Vascular:  Normal pulses all extremities Filed Vitals:   10/07/14 1044  BP: 149/82  Pulse: 60   Neurologic Exam Mental Status: Awake and fully alert. Oriented to place and time. Recent and remote memory intact. Attention span, concentration and fund of knowledge appropriate. Mood and affect appropriate.  Cranial Nerves: Fundoscopic exam reveals sharp disc margins. Pupils equal, briskly reactive to light. Extraocular movements full without nystagmus. Visual fields full to confrontation. Hearing intact. Facial sensation intact. Right lower face weakness mild.  Tongue, palate moves normally and symmetrically.  Motor: Normal bulk and tone. Normal strength in all tested extremity muscles. Diminished fine finger movements on the  right. Orbits left over right approximately. Mild weakness of right grip. Sensory.: intact to touch ,pinprick .position and vibratory sensation.  Coordination: Rapid alternating movements normal in all extremities. Finger-to-nose and heel-to-shin performed accurately bilaterally. Gait and Station: Arises from chair without difficulty. Stance is normal. Gait demonstrates normal stride length and balance . Able to heel, toe and tandem walk without difficulty.  Reflexes: 1+ and symmetric. Toes downgoing.       ASSESSMENT: 63 year Caucasian male with embolic left middle cerebral artery branch infarct in December 2015 without definite identified source. Vascular risk factors of hypertension and borderline diabetes. Remote history of transient postoperative atrial fibrillation following aortic aneurysm repair in 2013 not felt to be indication for long-term anticoagulation per cardiology     PLAN: I had a long d/w patient about his remote stroke, risk for recurrent stroke/TIAs, personally independently reviewed imaging studies and stroke evaluation results and answered questions.Continue Plavix 75 mg alone and discontinue aspirin as I do not see any indication for long-term  dual antiplatelets therapy for secondary stroke prevention and maintain strict control of hypertension with blood pressure goal below 130/90, diabetes with hemoglobin A1c goal below 6.5% and lipids with LDL cholesterol goal below 70 mg/dL. Increase Zocor to 20 mg daily and check lipid profile and LFTs in 2 months. I also advised the patient to eat a healthy diet with plenty of whole grains, cereals, fruits and vegetables, exercise regularly and maintain ideal body weight. I have advised him to keep a blood pressure log and discuss with primary physician or cardiologist increasing medications. Check screening follow-up carotid ultrasound study. Followup in the future with me in one year or call earlier if necessary.  Antony Contras,  MD   Note: This document was prepared with digital dictation and possible smart phrase technology. Any transcriptional errors that result from this process are unintentional

## 2014-10-08 DIAGNOSIS — L570 Actinic keratosis: Secondary | ICD-10-CM | POA: Insufficient documentation

## 2014-10-08 NOTE — Assessment & Plan Note (Addendum)
Treated with x3 with liq N2, tolerated well.   F/u prn.  Can retreat if needed.  >5 minutes spent in face to face time with patient, >50% spent in counselling or coordination of care, including discussion re: labs above.

## 2014-10-08 NOTE — Progress Notes (Signed)
Prev with AK noted and treated with liq N2.  He noted another lesion on scalp and wanted treatment.  D/w pt.   HCV testing and labs d/w pt.  Likely with prev exposure, now clearing. With neg viral load, no evidence of infection.  Unclear if/when patient had exposure.  D/w pt.  No other w/u needed now, pt reassured.    Meds, vitals, and allergies reviewed.   ROS: See HPI.  Otherwise, noncontributory.  nad ncat Scalp with prev treated AK healing.  Another smaller AK noted R of midline on top of scalp.  Treated with x3 with liq N2, tolerated well.

## 2014-10-12 ENCOUNTER — Ambulatory Visit (INDEPENDENT_AMBULATORY_CARE_PROVIDER_SITE_OTHER): Payer: 59

## 2014-10-12 ENCOUNTER — Other Ambulatory Visit: Payer: Self-pay

## 2014-10-12 ENCOUNTER — Other Ambulatory Visit: Payer: Self-pay | Admitting: *Deleted

## 2014-10-12 ENCOUNTER — Telehealth: Payer: Self-pay

## 2014-10-12 DIAGNOSIS — F419 Anxiety disorder, unspecified: Secondary | ICD-10-CM

## 2014-10-12 DIAGNOSIS — I6522 Occlusion and stenosis of left carotid artery: Secondary | ICD-10-CM

## 2014-10-12 MED ORDER — CLOPIDOGREL BISULFATE 75 MG PO TABS: 75.0000 mg | ORAL_TABLET | Freq: Every day | ORAL | Status: DC

## 2014-10-12 NOTE — Telephone Encounter (Signed)
Opened in error

## 2014-10-12 NOTE — Telephone Encounter (Signed)
Refilled by PCP 10/01/14 with 1 year supply

## 2014-10-12 NOTE — Telephone Encounter (Signed)
Received a faxed refill request for Alprazolam from [facility] refill 07/10/14 #30 Is it okay to refill medication?

## 2014-10-13 ENCOUNTER — Encounter: Payer: Self-pay | Admitting: Internal Medicine

## 2014-10-13 DIAGNOSIS — F419 Anxiety disorder, unspecified: Secondary | ICD-10-CM | POA: Insufficient documentation

## 2014-10-13 MED ORDER — ALPRAZOLAM 0.5 MG PO TABS: ORAL_TABLET | ORAL | Status: DC

## 2014-10-13 NOTE — Telephone Encounter (Signed)
Refill faxed to [facility] as instructed.

## 2014-10-13 NOTE — Telephone Encounter (Signed)
Printed, please fax in.  Thanks.  

## 2014-10-14 ENCOUNTER — Other Ambulatory Visit: Payer: Self-pay | Admitting: *Deleted

## 2014-10-14 MED ORDER — DILTIAZEM HCL ER COATED BEADS 180 MG PO CP24: 180.0000 mg | ORAL_CAPSULE | Freq: Every day | ORAL | Status: DC

## 2014-10-14 MED ORDER — POTASSIUM CHLORIDE ER 10 MEQ PO TBCR: 10.0000 meq | EXTENDED_RELEASE_TABLET | Freq: Every day | ORAL | Status: DC

## 2014-10-14 MED ORDER — HYDROCHLOROTHIAZIDE 25 MG PO TABS: 25.0000 mg | ORAL_TABLET | ORAL | Status: DC

## 2014-10-18 ENCOUNTER — Ambulatory Visit (INDEPENDENT_AMBULATORY_CARE_PROVIDER_SITE_OTHER): Payer: 59 | Admitting: *Deleted

## 2014-10-18 DIAGNOSIS — I639 Cerebral infarction, unspecified: Secondary | ICD-10-CM

## 2014-10-19 NOTE — Progress Notes (Signed)
Loop recorder 

## 2014-10-22 ENCOUNTER — Other Ambulatory Visit: Payer: Self-pay

## 2014-10-22 MED ORDER — SIMVASTATIN 10 MG PO TABS: 20.0000 mg | ORAL_TABLET | Freq: Every day | ORAL | Status: DC

## 2014-10-26 ENCOUNTER — Telehealth: Payer: Self-pay

## 2014-10-26 NOTE — Telephone Encounter (Signed)
Pt left v/m requesting status of simvastatin refill; left detailed message per DPR per 10/22/14 phone note from Dr Leonie Man.

## 2014-10-27 LAB — CUP PACEART REMOTE DEVICE CHECK: MDC IDC SESS DTM: 20160911233757

## 2014-10-27 NOTE — Progress Notes (Signed)
Carelink summary report received. Battery status OK. Normal device function. No new symptom episodes, tachy episodes, brady, or pause episodes. No new AF episodes. Monthly summary reports and ROV with GT in 01/2015 (recall letter sent).

## 2014-11-03 ENCOUNTER — Other Ambulatory Visit: Payer: Self-pay

## 2014-11-03 MED ORDER — METOPROLOL TARTRATE 25 MG PO TABS: 37.5000 mg | ORAL_TABLET | Freq: Two times a day (BID) | ORAL | Status: DC

## 2014-11-03 NOTE — Telephone Encounter (Signed)
Pt request metoprolol tartrate 25 mg to [facility]; pt will ck with Dr Clydene Fake office about simvastatin refill. Advised pt cancelled rx at [facility] and sent to [facility]. Pt voiced understanding.

## 2014-11-08 ENCOUNTER — Encounter: Payer: Self-pay | Admitting: Neurology

## 2014-11-10 ENCOUNTER — Encounter: Payer: Self-pay | Admitting: Internal Medicine

## 2014-11-12 ENCOUNTER — Telehealth: Payer: Self-pay

## 2014-11-12 NOTE — Telephone Encounter (Signed)
LFt vm for Cindy patients husband to call back about the patients carotid ultrasound results.

## 2014-11-15 NOTE — Telephone Encounter (Signed)
Mailed copy of signed doppler study to pt.

## 2014-11-17 ENCOUNTER — Ambulatory Visit (INDEPENDENT_AMBULATORY_CARE_PROVIDER_SITE_OTHER): Payer: 59 | Admitting: *Deleted

## 2014-11-17 DIAGNOSIS — I638 Other cerebral infarction: Secondary | ICD-10-CM

## 2014-11-17 DIAGNOSIS — I6389 Other cerebral infarction: Secondary | ICD-10-CM

## 2014-11-17 NOTE — Progress Notes (Signed)
Loop recorder 

## 2014-11-22 LAB — CUP PACEART REMOTE DEVICE CHECK
Date Time Interrogation Session: 20161016050529
Zone Setting Detection Interval: 2000 ms
Zone Setting Detection Interval: 3000 ms
Zone Setting Detection Interval: 340 ms

## 2014-11-22 NOTE — Progress Notes (Signed)
Carelink summary report received. Battery status OK. Normal device function. No new symptom episodes, tachy episodes, brady, or pause episodes. No new AF episodes. Monthly summary reports and ROV with GT in 01/2015.

## 2014-11-24 NOTE — Telephone Encounter (Signed)
Patient was mail copy of carotid dopplers on 11-16-14.

## 2014-11-24 NOTE — Telephone Encounter (Signed)
Patient sent a message in my chart about his results for the carotid ultrasound.

## 2014-12-16 ENCOUNTER — Ambulatory Visit (INDEPENDENT_AMBULATORY_CARE_PROVIDER_SITE_OTHER): Payer: 59 | Admitting: *Deleted

## 2014-12-16 DIAGNOSIS — I638 Other cerebral infarction: Secondary | ICD-10-CM | POA: Diagnosis not present

## 2014-12-16 DIAGNOSIS — I6389 Other cerebral infarction: Secondary | ICD-10-CM

## 2014-12-17 NOTE — Progress Notes (Signed)
Carelink Summary Report / Loop recorder 

## 2015-01-03 ENCOUNTER — Other Ambulatory Visit: Payer: Self-pay | Admitting: *Deleted

## 2015-01-03 MED ORDER — LOSARTAN POTASSIUM 100 MG PO TABS: 100.0000 mg | ORAL_TABLET | Freq: Every day | ORAL | Status: DC

## 2015-01-14 LAB — CUP PACEART REMOTE DEVICE CHECK: Date Time Interrogation Session: 20161111003721

## 2015-01-14 NOTE — Progress Notes (Signed)
Carelink summary report received. Battery status OK. Normal device function. No new symptom episodes, tachy episodes, brady, or pause episodes. No new AF episodes. Monthly summary reports and ROV with GT in 01/2015 (recall letter sent). 

## 2015-01-17 ENCOUNTER — Ambulatory Visit (INDEPENDENT_AMBULATORY_CARE_PROVIDER_SITE_OTHER): Payer: 59 | Admitting: *Deleted

## 2015-01-17 DIAGNOSIS — I638 Other cerebral infarction: Secondary | ICD-10-CM

## 2015-01-17 DIAGNOSIS — I6389 Other cerebral infarction: Secondary | ICD-10-CM

## 2015-01-17 NOTE — Progress Notes (Signed)
Carelink Summary Report / Loop Recorder 

## 2015-02-01 ENCOUNTER — Encounter: Payer: Self-pay | Admitting: *Deleted

## 2015-02-06 DIAGNOSIS — K635 Polyp of colon: Secondary | ICD-10-CM

## 2015-02-06 HISTORY — DX: Polyp of colon: K63.5

## 2015-02-14 ENCOUNTER — Ambulatory Visit (INDEPENDENT_AMBULATORY_CARE_PROVIDER_SITE_OTHER): Payer: BC Managed Care – PPO | Admitting: *Deleted

## 2015-02-14 DIAGNOSIS — I638 Other cerebral infarction: Secondary | ICD-10-CM

## 2015-02-14 DIAGNOSIS — I6389 Other cerebral infarction: Secondary | ICD-10-CM

## 2015-02-15 NOTE — Progress Notes (Signed)
Carelink Summary Report / Loop Recorder 

## 2015-03-05 LAB — CUP PACEART REMOTE DEVICE CHECK: Date Time Interrogation Session: 20161211010813

## 2015-03-08 ENCOUNTER — Encounter: Payer: Self-pay | Admitting: Internal Medicine

## 2015-03-16 ENCOUNTER — Ambulatory Visit (INDEPENDENT_AMBULATORY_CARE_PROVIDER_SITE_OTHER): Payer: BC Managed Care – PPO | Admitting: *Deleted

## 2015-03-16 DIAGNOSIS — I6389 Other cerebral infarction: Secondary | ICD-10-CM

## 2015-03-16 DIAGNOSIS — I638 Other cerebral infarction: Secondary | ICD-10-CM

## 2015-03-17 NOTE — Progress Notes (Signed)
Carelink Summary Report / Loop Recorder 

## 2015-03-24 ENCOUNTER — Encounter: Payer: Self-pay | Admitting: Internal Medicine

## 2015-03-24 ENCOUNTER — Ambulatory Visit (INDEPENDENT_AMBULATORY_CARE_PROVIDER_SITE_OTHER): Payer: BC Managed Care – PPO | Admitting: Internal Medicine

## 2015-03-24 VITALS — BP 162/78 | HR 59 | Ht 67.0 in | Wt 221.8 lb

## 2015-03-24 DIAGNOSIS — I639 Cerebral infarction, unspecified: Secondary | ICD-10-CM | POA: Diagnosis not present

## 2015-03-24 DIAGNOSIS — I7100 Dissection of unspecified site of aorta: Secondary | ICD-10-CM | POA: Diagnosis not present

## 2015-03-24 DIAGNOSIS — I4891 Unspecified atrial fibrillation: Secondary | ICD-10-CM | POA: Diagnosis not present

## 2015-03-24 NOTE — Progress Notes (Signed)
HPI [name] returns today for followup. He is a pleasant 60 yo man with cryptogenic stroke, s/p ILR insertion. He has HTN. In the interim, he has been stable. He has a remote h/o aortic dissection with an exetensive repair complicated by renal failure for which he was initially on HD. He has done well in the interim but is concerned about his residual descending aortic dissection. He has no chest pain or sob. No edema. No syncope. Allergies  Allergen Reactions  . Lipitor [Atorvastatin]     aches     Current Outpatient Prescriptions  Medication Sig Dispense Refill  . ALPRAZolam (XANAX) 0.5 MG tablet take 1 tablet by mouth every 6 hours if needed anxiety 30 tablet 1  . augmented betamethasone dipropionate (DIPROLENE) 0.05 % ointment Apply 1 application topically daily as needed (Xerosis).    . clopidogrel (PLAVIX) 75 MG tablet Take 1 tablet (75 mg total) by mouth daily. 90 tablet 3  . diltiazem (CARDIZEM CD) 180 MG 24 hr capsule Take 1 capsule (180 mg total) by mouth daily. 90 capsule 3  . hydrochlorothiazide (HYDRODIURIL) 25 MG tablet Take 1 tablet (25 mg total) by mouth as directed. 90 tablet 3  . losartan (COZAAR) 100 MG tablet Take 1 tablet (100 mg total) by mouth daily. 90 tablet 3  . metoprolol tartrate (LOPRESSOR) 25 MG tablet Take 1.5 tablets (37.5 mg total) by mouth 2 (two) times daily. 270 tablet 3  . Multiple Vitamin (MULTIVITAMIN WITH MINERALS) TABS tablet Take 1 tablet by mouth daily.    . potassium chloride (K-DUR) 10 MEQ tablet Take 1 tablet (10 mEq total) by mouth daily. 90 tablet 3  . simvastatin (ZOCOR) 10 MG tablet Take 2 tablets (20 mg total) by mouth at bedtime. 180 tablet 3   No current facility-administered medications for this visit.     Past Medical History  Diagnosis Date  . Hypertension   . Hyperglycemia     a. A1C 5.9 in Sept 2013.  Marland Kitchen RBBB   . Renal insufficiency 08/28/2012  . PE (pulmonary embolism)   . CVA (cerebral infarction)   . Aortic  dissection (HCC)     ROS:   All systems reviewed and negative except as noted in the HPI.   Past Surgical History  Procedure Laterality Date  . Knee surgery      LEFT 1981  . Carpal tunnel release      2004 RIGHT  . Wisdom tooth extraction    . Thoracic aortic aneurysm repair  10/30/2011    Procedure: THORACIC ASCENDING ANEURYSM REPAIR (AAA);  Surgeon: Ivin Poot, MD;  Location: [facility];  Service: Open Heart Surgery;  Laterality: N/A;  Ascending aortic dissection repair, arch reconstruction, aortic valve repair  . Insertion of dialysis catheter  11/06/2011    Procedure: INSERTION OF DIALYSIS CATHETER;  Surgeon: Ivin Poot, MD;  Location: [facility];  Service: Thoracic;  Laterality: Right;  . Mediastinal exploration  11/06/2011    Procedure: MEDIASTINAL EXPLORATION;  Surgeon: Ivin Poot, MD;  Location: [facility];  Service: Thoracic;  Laterality: N/A;  sternal closure; removal of wound vac  . Insertion of dialysis catheter  11/13/2011    Procedure: INSERTION OF DIALYSIS CATHETER;  Surgeon: Elam Dutch, MD;  Location: [facility];  Service: Vascular;  Laterality: N/A;  Ultrasound guided.  . Cardiac valve replacement  10/2011    Aortic valve repaired  . Left heart catheterization with coronary angiogram N/A 10/30/2011  Procedure: LEFT HEART CATHETERIZATION WITH CORONARY ANGIOGRAM;  Surgeon: Burnell Blanks, MD;  Location: [facility] CATH LAB;  Service: Cardiovascular;  Laterality: N/A;  . Loop recorder implant N/A 01/20/2014    MDT LINQ implanted by Dr Lovena Le for cryptogenic stroke  . Tee without cardioversion N/A 01/20/2014    Procedure: TRANSESOPHAGEAL ECHOCARDIOGRAM (TEE);  Surgeon: Candee Furbish, MD;  Location: [facility] ENDOSCOPY;  Service: Cardiovascular;  Laterality: N/A;     Family History  Problem Relation Age of Onset  . Parkinsonism Mother   . Leukemia Father     AML at 66  . Colon polyps Brother   . Kidney disease Brother   . Heart disease Brother     CABG at age 38.  .  Prostate cancer Neg Hx   . Colon cancer Neg Hx   . Heart disease Father     Angioplasty in his 44s, CABG age 74.     Social History   Social History  . Marital Status: Married    Spouse Name: N/A  . Number of Children: N/A  . Years of Education: N/A   Occupational History  . Not on file.   Social History Main Topics  . Smoking status: Former Smoker    Types: Cigarettes    Quit date: 02/05/1990  . Smokeless tobacco: Never Used     Comment: Smoked for 20 years.  . Alcohol Use: 0.0 oz/week    0 Standard drinks or equivalent per week     Comment: 6 beers a week on average  . Drug Use: No  . Sexual Activity: Not on file   Other Topics Concern  . Not on file   Social History Narrative   Prev worked at [employer] with frequent long travel.    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     BP 162/78 mmHg  Pulse 59  Ht 5\' 7"  (1.702 m)  Wt 221 lb 12.8 oz (100.608 kg)  BMI 34.73 kg/m2  Physical Exam:  Well appearing middle aged man, NAD HEENT: Unremarkable Neck:  6 cm JVD, no thyromegally Back:  No CVA tenderness Lungs:  Clear with no wheezes HEART:  Regular rate rhythm, no murmurs, no rubs, no clicks Abd:  soft, positive bowel sounds, no organomegally, no rebound, no guarding Ext:  2 plus pulses, no edema, no cyanosis, no clubbing Skin:  No rashes no nodules Neuro:  CN II through XII intact, motor grossly intact  EKG - nsr with RBBB  DEVICE  Normal device function.  See PaceArt for details. No atrial fib  Assess/Plan: 1. Cryptogenic stroke - no obvious etiology. Will monitor his ILR for atrial fib 2. Descending aortic dissection - he is asymptomatic but is quite anxious. "I feel like a time bomb". I will refer him back to Dr. PVT 3. HTN - his blood pressure is chronically elevated. If not improved on his next visit, would consider switching him to coreg from metoprolol. 4. Obesity - he is encouraged to lose weight.  Mikle Bosworth.D.

## 2015-03-24 NOTE — Patient Instructions (Signed)
Medication Instructions:  Your physician recommends that you continue on your current medications as directed. Please refer to the Current Medication list given to you today.    Labwork: None ordered   Testing/Procedures: None ordered   Follow-Up: Your physician wants you to follow-up in: 12 months with Dr Knox Saliva will receive a reminder letter in the mail two months in advance. If you don't receive a letter, please call our office to schedule the follow-up appointment.  Your physician recommends that you schedule a follow-up appointment with Dr Darcey Nora to follow up on aortic aneurysm     Any Other Special Instructions Will Be Listed Below (If Applicable).     If you need a refill on your cardiac medications before your next appointment, please call your pharmacy.

## 2015-03-28 ENCOUNTER — Other Ambulatory Visit: Payer: Self-pay | Admitting: *Deleted

## 2015-03-28 DIAGNOSIS — I7100 Dissection of unspecified site of aorta: Secondary | ICD-10-CM

## 2015-03-29 ENCOUNTER — Other Ambulatory Visit: Payer: Self-pay

## 2015-03-29 ENCOUNTER — Other Ambulatory Visit: Payer: Self-pay | Admitting: *Deleted

## 2015-03-29 DIAGNOSIS — I7100 Dissection of unspecified site of aorta: Secondary | ICD-10-CM

## 2015-03-29 DIAGNOSIS — I712 Thoracic aortic aneurysm, without rupture, unspecified: Secondary | ICD-10-CM

## 2015-03-29 LAB — CREATININE, ISTAT: Creatinine, IStat: 1.2 mg/dL (ref 0.6–1.3)

## 2015-03-30 ENCOUNTER — Ambulatory Visit
Admission: RE | Admit: 2015-03-30 | Discharge: 2015-03-30 | Disposition: A | Discharge status: Home / Self Care | Payer: BC Managed Care – PPO | Source: Ambulatory Visit | Attending: Cardiothoracic Surgery | Admitting: Cardiothoracic Surgery

## 2015-03-30 ENCOUNTER — Other Ambulatory Visit: Payer: Self-pay

## 2015-03-30 ENCOUNTER — Inpatient Hospital Stay: Admission: RE | Admit: 2015-03-30 | Payer: BC Managed Care – PPO | Source: Ambulatory Visit

## 2015-03-30 ENCOUNTER — Ambulatory Visit: Payer: BC Managed Care – PPO | Admitting: Cardiothoracic Surgery

## 2015-03-30 ENCOUNTER — Telehealth: Payer: Self-pay | Admitting: Family Medicine

## 2015-03-30 DIAGNOSIS — I712 Thoracic aortic aneurysm, without rupture, unspecified: Secondary | ICD-10-CM

## 2015-03-30 NOTE — Telephone Encounter (Signed)
Pt left message on triage phone requesting all current medications be transferred to the [facility] from [facility] order pharmacy.  Best number to call is [phone]

## 2015-03-31 ENCOUNTER — Ambulatory Visit
Admission: RE | Admit: 2015-03-31 | Discharge: 2015-03-31 | Disposition: A | Discharge status: Home / Self Care | Payer: BC Managed Care – PPO | Source: Ambulatory Visit | Attending: Cardiothoracic Surgery | Admitting: Cardiothoracic Surgery

## 2015-03-31 ENCOUNTER — Other Ambulatory Visit: Payer: Self-pay | Admitting: *Deleted

## 2015-03-31 DIAGNOSIS — I712 Thoracic aortic aneurysm, without rupture, unspecified: Secondary | ICD-10-CM

## 2015-03-31 MED ORDER — DILTIAZEM HCL ER COATED BEADS 180 MG PO CP24: 180.0000 mg | ORAL_CAPSULE | Freq: Every day | ORAL | Status: DC

## 2015-03-31 MED ORDER — LOSARTAN POTASSIUM 100 MG PO TABS: 100.0000 mg | ORAL_TABLET | Freq: Every day | ORAL | Status: DC

## 2015-03-31 MED ORDER — METOPROLOL TARTRATE 25 MG PO TABS: 37.5000 mg | ORAL_TABLET | Freq: Two times a day (BID) | ORAL | Status: DC

## 2015-03-31 MED ORDER — GADOBENATE DIMEGLUMINE 529 MG/ML IV SOLN
20.0000 mL | Freq: Once | INTRAVENOUS | Status: AC | PRN
  Administered 2015-03-31: 20 mL via INTRAVENOUS

## 2015-03-31 MED ORDER — POTASSIUM CHLORIDE ER 10 MEQ PO TBCR: 10.0000 meq | EXTENDED_RELEASE_TABLET | Freq: Every day | ORAL | Status: DC

## 2015-03-31 MED ORDER — HYDROCHLOROTHIAZIDE 25 MG PO TABS: 25.0000 mg | ORAL_TABLET | ORAL | Status: DC

## 2015-03-31 NOTE — Telephone Encounter (Signed)
Patient left message on VM of triage that he wants all of his Rx's transferred to [facility].  Phoned patient to ask for street address to identify the location he was requesting.  Pt indicated it was on [address].  Patient also advised that some of his medications were not prescribed by Dr. Damita Dunnings.  Rx's sent to [facility]

## 2015-03-31 NOTE — Telephone Encounter (Signed)
See Refill Request

## 2015-04-06 ENCOUNTER — Other Ambulatory Visit: Payer: Self-pay | Admitting: *Deleted

## 2015-04-06 ENCOUNTER — Encounter: Payer: Self-pay | Admitting: Gastroenterology

## 2015-04-06 ENCOUNTER — Encounter: Payer: Self-pay | Admitting: Cardiothoracic Surgery

## 2015-04-06 ENCOUNTER — Ambulatory Visit (INDEPENDENT_AMBULATORY_CARE_PROVIDER_SITE_OTHER): Payer: BC Managed Care – PPO | Admitting: Cardiothoracic Surgery

## 2015-04-06 VITALS — BP 142/69 | HR 48 | Resp 16 | Ht 67.5 in | Wt 215.0 lb

## 2015-04-06 DIAGNOSIS — R51 Headache: Secondary | ICD-10-CM

## 2015-04-06 DIAGNOSIS — R519 Headache, unspecified: Secondary | ICD-10-CM

## 2015-04-06 DIAGNOSIS — R918 Other nonspecific abnormal finding of lung field: Secondary | ICD-10-CM

## 2015-04-06 DIAGNOSIS — I7101 Dissection of thoracic aorta: Secondary | ICD-10-CM | POA: Diagnosis not present

## 2015-04-06 DIAGNOSIS — Z09 Encounter for follow-up examination after completed treatment for conditions other than malignant neoplasm: Secondary | ICD-10-CM | POA: Diagnosis not present

## 2015-04-06 DIAGNOSIS — I71019 Dissection of thoracic aorta, unspecified: Secondary | ICD-10-CM

## 2015-04-06 DIAGNOSIS — R911 Solitary pulmonary nodule: Secondary | ICD-10-CM

## 2015-04-06 LAB — CUP PACEART REMOTE DEVICE CHECK: Date Time Interrogation Session: 20170110010936

## 2015-04-06 NOTE — Progress Notes (Signed)
PCP is Elsie Stain, MD Referring Provider is Evans Lance, MD  Chief Complaint  Patient presents with  . 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 He h as not had a scan since 2014. In 2015 and echocardiogram was performed which showed normal LV function and mild-moderate aortic insufficiency from the resuspended aortic valve.  Patient's medical history significant for  Hypertension Left frontal lobe and CVA with right-sided weakness now improved 2015. Placed on Plavix Negative evaluation for atrial fibrillation by a loop recorder following his stroke Chronic renal insufficiency-last creatinine 1.35  Because of an elevated creatinine radiology recommended a MRA of the thoracic aorta and set of a CTA. I reviewed this personally and counseled the patient with the images. The ascending aorta and aortic graft remained intact without pseudoaneurysm and good perfusion of the aortic root and arch vessels. The transverse diameter of the coronary sinuses is 4.5 cm, unchanged since 2013. The false lumen is patent in the descending thoracic aorta but the total thoracic aortic diameter is 3.8  Centimeters.  And subtle finding on the MR is-MRA is a 2.5 cm lobulated mass in the posterior segment of the left upper lobe consistent with lung cancer. Based on the technique of MRI it is difficult to assess the mediastinal nodes.  The patient is a nonsmoker. He previously smoked but stopped in 1994. The patient denies any palmar he symptoms of cough, hemoptysis, chest pain, shortness of breath. 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 Hypertension   . Hyperglycemia     a. A1C 5.9 in Sept 2013.  Marland Kitchen RBBB   . Renal insufficiency 08/28/2012  . PE (pulmonary embolism)   . CVA (cerebral infarction)   . Aortic dissection [facility])     Past Surgical History  Procedure Laterality Date  . Knee surgery      LEFT 1981  . Carpal tunnel release      2004 RIGHT  . Wisdom tooth extraction    . Thoracic aortic aneurysm repair  10/30/2011    Procedure: THORACIC ASCENDING ANEURYSM REPAIR (AAA);  Surgeon: Ivin Poot, MD;  Location: [place];  Service: Open Heart Surgery;  Laterality: N/A;  Ascending aortic dissection repair, arch reconstruction, aortic valve repair  . Insertion of dialysis catheter  11/06/2011    Procedure: INSERTION OF DIALYSIS CATHETER;  Surgeon: Ivin Poot, MD;  Location: [place];  Service: Thoracic;  Laterality: Right;  . Mediastinal exploration  11/06/2011    Procedure: MEDIASTINAL EXPLORATION;  Surgeon: Ivin Poot, MD;  Location: [place];  Service: Thoracic;  Laterality: N/A;  sternal closure; removal of wound vac  . Insertion of dialysis catheter  11/13/2011    Procedure: INSERTION OF DIALYSIS CATHETER;  Surgeon: Elam Dutch, MD;  Location: [place];  Service: Vascular;  Laterality: N/A;  Ultrasound guided.  . Cardiac valve replacement  10/2011    Aortic valve repaired  . Left heart catheterization with coronary angiogram N/A 10/30/2011    Procedure: LEFT HEART CATHETERIZATION WITH CORONARY ANGIOGRAM;  Surgeon: Burnell Blanks, MD;  Location: [place] CATH LAB;  Service: Cardiovascular;  Laterality: N/A;  . Loop recorder implant N/A 01/20/2014    MDT LINQ implanted by Dr Lovena Le for cryptogenic stroke  . Tee without cardioversion N/A 01/20/2014    Procedure: TRANSESOPHAGEAL ECHOCARDIOGRAM (TEE);  Surgeon: Candee Furbish, MD;  Location: [facility] ENDOSCOPY;  Service: Cardiovascular;  Laterality: N/A;    Family History  Problem Relation Age of Onset   . Parkinsonism Mother   . Leukemia Father     AML at 33  . Colon polyps Brother   . Kidney disease Brother   . Heart disease Brother     CABG at age 7.  . Prostate cancer Neg Hx   . Colon cancer Neg Hx   . Heart disease Father     Angioplasty in his 14s, CABG age 74.    Social History Social History  Substance Use Topics  . Smoking status: Former Smoker    Types: Cigarettes    Quit date: 02/05/1990  . Smokeless tobacco: Never Used     Comment: Smoked for 20 years.  . Alcohol Use: 0.0 oz/week    0 Standard drinks or equivalent per week     Comment: 6 beers a week on average    Current Outpatient Prescriptions  Medication Sig Dispense Refill  . ALPRAZolam (XANAX) 0.5 MG tablet take 1 tablet by mouth every 6 hours if needed anxiety 30 tablet 1  . clopidogrel (PLAVIX) 75 MG tablet Take 1 tablet (75 mg total) by mouth daily. 90 tablet 3  . diltiazem (CARDIZEM CD) 180 MG 24 hr capsule Take 1 capsule (180 mg total) by mouth daily. 90 capsule 1  . hydrochlorothiazide (HYDRODIURIL) 25 MG tablet Take 1 tablet (25 mg total) by mouth as directed. 90 tablet 1  . losartan (COZAAR) 100 MG tablet Take 1 tablet (100 mg total) by mouth daily. 90 tablet 1  . metoprolol tartrate (LOPRESSOR) 25 MG tablet Take 1.5 tablets (37.5 mg total) by mouth 2 (two) times daily. 270 tablet 1  . Multiple Vitamin (MULTIVITAMIN WITH MINERALS) TABS tablet Take 1 tablet by mouth daily.    . potassium chloride (K-DUR) 10 MEQ tablet Take 1 tablet (10 mEq total) by mouth daily. 90 tablet 1  . simvastatin (ZOCOR) 10 MG tablet Take 2 tablets (20 mg total) by mouth at bedtime. 180 tablet 3   No current facility-administered medications for this visit.    Allergies  Allergen Reactions  . Lipitor [Atorvastatin]     aches    Review of Systems        Review of Systems :  [ y ] = yes, [  ] = no        General :  Weight gain [   ]    Weight loss  [   ]  Fatigue [  ]  Fever [  ]  Chills  [  ]                                 Weakness  [  ]           HEENT    Headache [  ]  Dizziness [  ]  Blurred vision [  ] Glaucoma  [  ]                          Nosebleeds [  ] Painful or  loose teeth [  ]        Cardiac :  Chest pain/ pressure [  ]  Resting SOB [  ] exertional SOB [  ]                        Orthopnea [  ]  Pedal edema  [  ]  Palpitations [  ] Syncope/presyncope [ ]                         Paroxysmal nocturnal dyspnea [  ] positive for hypertension         Pulmonary : cough [  ]  wheezing [  ]  Hemoptysis [  ] Sputum [  ] Snoring [  ]                              Pneumothorax [  ]  Sleep apnea [  ]        GI : Vomiting [  ]  Dysphagia [  ]  Melena  [  ]  Abdominal pain [  ] BRBPR [  ]              Heart burn [  ]  Constipation [  ] Diarrhea  [  ] Colonoscopy [   ]        GU : Hematuria [  ]  Dysuria [  ]  Nocturia [  ] UTI's [  ]        Vascular : Claudication [  ]  Rest pain [  ]  DVT [  ] Vein stripping [  ] leg ulcers [  ]                          TIA [  ] Stroke [ yes-probable embolic left frontal lobe 101, cryptogenic source ]  Varicose veins [  ]        NEURO :  Headaches  [  ] Seizures [  ] Vision changes [now improved after stroke  ] Paresthesias [  ]    Seizures [  ]        Musculoskeletal :  Arthritis [  ] Gout  [  ]  Back pain [  ]  Joint pain [  ]        Skin :  Rash [  ]  Melanoma [  ] Sores [  ]        Heme : Bleeding problems [  ]Clotting Disorders [  ] Anemia [  ]Blood Transfusion [ ]         Endocrine : Diabetes [  ] Heat or Cold intolerance [  ] Polyuria [  ]excessive thirst [ ]         Psych : Depression [  ]  Anxiety [  ]  Psych hospitalizations [  ] Memory change [  ]                                               BP 142/69 mmHg  Pulse 48  Resp 16  Ht 5' 7.5" (1.715 m)  Wt 215 lb (97.523 kg)  BMI 33.16 kg/m2  SpO2 97% Physical Exam  Physical Exam  General: obese middle-aged Caucasian male no acute distress, somewhat anxious over lung mass noted on MRA  of the chest as an incidental finding HEENT: Normocephalic pupils equal , dentition adequate Neck: Supple without JVD, adenopathy, or bruit Chest: Clear to auscultation, symmetrical breath sounds, no rhonchi, no tenderness             or deformity Cardiovascular: Regular rate and rhythm, no murmur, no gallop, peripheral pulses             palpable in all extremities Abdomen:  Soft, nontender, no palpable mass or organomegaly Extremities: Warm, well-perfused, no clubbing cyanosis edema or tenderness,              no venous stasis changes of the legs Rectal/GU: Deferred Neuro: Grossly non--focal and symmetrical throughout Skin: Clean and dry without rash or ulceration    Diagnostic Tests: MRI-MRA images personally reviewed and counseled with patient. He has a 2.5 cm mass no of upper lobe high risk for bronchogenic carcinoma The patient will need a PET scan and brain MRI to assess the malignant potential of the mass and to assess the presence of metastatic disease. He'll return the office for followup and further discussion of the PET scan  Impression: Aortic repair after type A dissection remains intact. Patent persistent false lumen in the descending thoracic aorta stable and there is no significant dilatation of the descending thoracic aorta.  Incidental finding of a 2.5 cm left upper lobe mass with high-risk for lung cancer.  Plan:patient will complete clinical staging with a PET scan and brain MRI and return for review and further discussion and planning.   Len Childs, MD [facility] [phone]

## 2015-04-06 NOTE — Progress Notes (Signed)
Carelink summary report received. Battery status OK. Normal device function. No new symptom episodes, tachy episodes, brady, or pause episodes. No new AF episodes. Monthly summary reports and ROV/PRN 

## 2015-04-07 ENCOUNTER — Other Ambulatory Visit: Payer: BC Managed Care – PPO

## 2015-04-12 LAB — CUP PACEART REMOTE DEVICE CHECK: Date Time Interrogation Session: 20170209013619

## 2015-04-12 NOTE — Progress Notes (Signed)
Carelink summary report received. Battery status OK. Normal device function. No new symptom episodes, tachy episodes, brady, or pause episodes. No new AF episodes. Monthly summary reports and ROV/PRN 

## 2015-04-14 ENCOUNTER — Ambulatory Visit (HOSPITAL_COMMUNITY)
Admission: RE | Admit: 2015-04-14 | Discharge: 2015-04-14 | Disposition: A | Discharge status: Home / Self Care | Payer: BC Managed Care – PPO | Source: Ambulatory Visit | Attending: Cardiothoracic Surgery | Admitting: Cardiothoracic Surgery

## 2015-04-14 DIAGNOSIS — R519 Headache, unspecified: Secondary | ICD-10-CM

## 2015-04-14 DIAGNOSIS — R51 Headache: Secondary | ICD-10-CM | POA: Insufficient documentation

## 2015-04-14 DIAGNOSIS — I739 Peripheral vascular disease, unspecified: Secondary | ICD-10-CM | POA: Diagnosis not present

## 2015-04-14 DIAGNOSIS — I700 Atherosclerosis of aorta: Secondary | ICD-10-CM | POA: Insufficient documentation

## 2015-04-14 DIAGNOSIS — R911 Solitary pulmonary nodule: Secondary | ICD-10-CM

## 2015-04-14 DIAGNOSIS — G319 Degenerative disease of nervous system, unspecified: Secondary | ICD-10-CM | POA: Insufficient documentation

## 2015-04-14 LAB — GLUCOSE, CAPILLARY: Glucose-Capillary: 104 mg/dL — ABNORMAL HIGH (ref 65–99)

## 2015-04-14 MED ORDER — FLUDEOXYGLUCOSE F - 18 (FDG) INJECTION
11.6800 | Freq: Once | INTRAVENOUS | Status: AC | PRN
  Administered 2015-04-14: 11.68 via INTRAVENOUS

## 2015-04-14 MED ORDER — GADOBENATE DIMEGLUMINE 529 MG/ML IV SOLN
20.0000 mL | Freq: Once | INTRAVENOUS | Status: AC | PRN
  Administered 2015-04-14: 20 mL via INTRAVENOUS

## 2015-04-15 ENCOUNTER — Telehealth: Payer: Self-pay | Admitting: *Deleted

## 2015-04-15 ENCOUNTER — Other Ambulatory Visit: Payer: Self-pay

## 2015-04-15 ENCOUNTER — Ambulatory Visit (INDEPENDENT_AMBULATORY_CARE_PROVIDER_SITE_OTHER): Payer: BC Managed Care – PPO | Admitting: *Deleted

## 2015-04-15 DIAGNOSIS — I639 Cerebral infarction, unspecified: Secondary | ICD-10-CM

## 2015-04-15 MED ORDER — CLOPIDOGREL BISULFATE 75 MG PO TABS: 75.0000 mg | ORAL_TABLET | Freq: Every day | ORAL | Status: DC

## 2015-04-15 MED ORDER — SIMVASTATIN 10 MG PO TABS: 20.0000 mg | ORAL_TABLET | Freq: Every day | ORAL | Status: DC

## 2015-04-15 NOTE — Telephone Encounter (Signed)
Called patient as the device clinic received a message that patient had an MRI and wants to ensure that he needs no reprogramming.  Advised patient that the MRI can clear information from an ILR, but that his home monitor transmits nightly and has not indicated that there are any episodes.  Patient is appreciative of call and denies questions or concerns at this time.

## 2015-04-18 NOTE — Progress Notes (Signed)
Carelink Summary Report / Loop Recorder 

## 2015-04-20 ENCOUNTER — Ambulatory Visit: Payer: BC Managed Care – PPO | Admitting: Cardiothoracic Surgery

## 2015-04-21 ENCOUNTER — Ambulatory Visit: Payer: BC Managed Care – PPO | Admitting: Cardiothoracic Surgery

## 2015-04-27 ENCOUNTER — Ambulatory Visit: Payer: BC Managed Care – PPO | Admitting: Cardiothoracic Surgery

## 2015-05-10 ENCOUNTER — Encounter: Payer: Self-pay | Admitting: Cardiovascular Disease

## 2015-05-10 ENCOUNTER — Ambulatory Visit (INDEPENDENT_AMBULATORY_CARE_PROVIDER_SITE_OTHER): Payer: BC Managed Care – PPO | Admitting: Cardiovascular Disease

## 2015-05-10 VITALS — BP 148/62 | HR 60 | Ht 67.0 in | Wt 215.2 lb

## 2015-05-10 DIAGNOSIS — I10 Essential (primary) hypertension: Secondary | ICD-10-CM

## 2015-05-10 DIAGNOSIS — E785 Hyperlipidemia, unspecified: Secondary | ICD-10-CM | POA: Diagnosis not present

## 2015-05-10 DIAGNOSIS — Z8679 Personal history of other diseases of the circulatory system: Secondary | ICD-10-CM

## 2015-05-10 DIAGNOSIS — Z9889 Other specified postprocedural states: Secondary | ICD-10-CM

## 2015-05-10 DIAGNOSIS — I4891 Unspecified atrial fibrillation: Secondary | ICD-10-CM | POA: Diagnosis not present

## 2015-05-10 MED ORDER — ROSUVASTATIN CALCIUM 5 MG PO TABS: 5.0000 mg | ORAL_TABLET | Freq: Every day | ORAL | Status: DC

## 2015-05-10 MED ORDER — CARVEDILOL 12.5 MG PO TABS: 12.5000 mg | ORAL_TABLET | Freq: Two times a day (BID) | ORAL | Status: DC

## 2015-05-10 NOTE — Progress Notes (Signed)
`    Cardiology Office Note   Date:  05/10/2015   ID:  HARUTYUN ALVERSON, DOB 24-Mar-1955, MRN [id number]  PCP:  Elsie Stain, MD  Cardiologist:   Acie Fredrickson Wonda Cheng, MD   No chief complaint on file.  Problem List 1. Ascending Aortic dissection - s/p repair  2  Cryptogenic stroke 3.  Essential HTN 4. Pulmonary embolus 5 . Hyperlipidemia   History of Present Illness: COHL WYSINGER is a 60 y.o. male who presents for follow-up office visit.  This 60 year old gentleman is seen for a scheduled follow-up office visit. He has a past history of an aortic dissection in the setting of uncontrolled hypertension. The dissection was in the ascending aorta. He underwent emergent repair and his postop course was complicated by PEA arrest and pulmonary emboli. He has a long history of hypertension. He presented to [facility] on 01/13/14 with evidence of a left brain stroke with right hemiparesis. His initial blood pressure was in the range of 200/115. He was given intravenous medication to bring his blood pressure down quickly after which he was given TPA. 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  He was discharged on Plavix.  He states that he is also taking a daily aspirin . Since we last saw him he has had a TEE and a implantation of a loop recorder.  The TEE showed no evidence of any source of emboli.  The loop recorder was implanted by Dr. Lovena Le on 01/20/14 for evaluation of cryptogenic stroke.  So far there have been no arrhythmias detected. The patient states that his blood pressure is still running high frequently.  He is concerned because of his known aortic dissection which extends down to the renal artery. He states that he has not been taking his Crestor because it is too expensive. He has not been having any chest pain or shortness of breath.  He still has some clumsiness of his right hand from the previous residual from  stroke.  He is due to see his neurologist Dr. Leonie Man next month.  He will confirm with Dr. Leonie Man concerning his appropriate antiplatelet therapy for his previous stroke.   May 10, 2015:  Previous brackbill patient,     Has asc. Aortic aneurism repair - several complications related  - pulmonary emboli, cardiac tamponade,   Past Medical History  Diagnosis Date  . Hypertension   . Hyperglycemia     a. A1C 5.9 in Sept 2013.  Marland Kitchen RBBB   . Renal insufficiency 08/28/2012  . PE (pulmonary embolism)   . CVA (cerebral infarction)   . Aortic dissection [facility])     Past Surgical History  Procedure Laterality Date  . Knee surgery      LEFT 1981  . Carpal tunnel release      2004 RIGHT  . Wisdom tooth extraction    . Thoracic aortic aneurysm repair  10/30/2011    Procedure: THORACIC ASCENDING ANEURYSM REPAIR (AAA);  Surgeon: Ivin Poot, MD;  Location: [place];  Service: Open Heart Surgery;  Laterality: N/A;  Ascending aortic dissection repair, arch reconstruction, aortic valve repair  . Insertion of dialysis catheter  11/06/2011    Procedure: INSERTION OF DIALYSIS CATHETER;  Surgeon: Ivin Poot, MD;  Location: [place];  Service: Thoracic;  Laterality: Right;  . Mediastinal exploration  11/06/2011    Procedure: MEDIASTINAL EXPLORATION;  Surgeon: Ivin Poot, MD;  Location: [place];  Service: Thoracic;  Laterality: N/A;  sternal closure; removal of wound vac  . Insertion of dialysis catheter  11/13/2011    Procedure: INSERTION OF DIALYSIS CATHETER;  Surgeon: Elam Dutch, MD;  Location: [place];  Service: Vascular;  Laterality: N/A;  Ultrasound guided.  . Cardiac valve replacement  10/2011    Aortic valve repaired  . Left heart catheterization with coronary angiogram N/A 10/30/2011    Procedure: LEFT HEART CATHETERIZATION WITH CORONARY ANGIOGRAM;  Surgeon: Burnell Blanks, MD;  Location: [facility] CATH LAB;  Service: Cardiovascular;  Laterality: N/A;  . Loop recorder implant N/A 01/20/2014      MDT LINQ implanted by Dr Lovena Le for cryptogenic stroke  . Tee without cardioversion N/A 01/20/2014    Procedure: TRANSESOPHAGEAL ECHOCARDIOGRAM (TEE);  Surgeon: Candee Furbish, MD;  Location: [facility] ENDOSCOPY;  Service: Cardiovascular;  Laterality: N/A;     Current Outpatient Prescriptions  Medication Sig Dispense Refill  . ALPRAZolam (XANAX) 0.5 MG tablet take 1 tablet by mouth every 6 hours if needed anxiety (Patient taking differently: Take 0.5 mg by mouth 2 (two) times daily as needed for anxiety. take 1 tablet by mouth every 6 hours if needed anxiety) 30 tablet 1  . clopidogrel (PLAVIX) 75 MG tablet Take 1 tablet (75 mg total) by mouth daily. 90 tablet 3  . diltiazem (CARDIZEM CD) 180 MG 24 hr capsule Take 1 capsule (180 mg total) by mouth daily. 90 capsule 1  . hydrochlorothiazide (HYDRODIURIL) 25 MG tablet Take 1 tablet (25 mg total) by mouth as directed. (Patient taking differently: Take 25 mg by mouth daily. ) 90 tablet 1  . losartan (COZAAR) 100 MG tablet Take 1 tablet (100 mg total) by mouth daily. 90 tablet 1  . metoprolol tartrate (LOPRESSOR) 25 MG tablet Take 1.5 tablets (37.5 mg total) by mouth 2 (two) times daily. 270 tablet 1  . Multiple Vitamin (MULTIVITAMIN WITH MINERALS) TABS tablet Take 1 tablet by mouth daily.    . potassium chloride (K-DUR) 10 MEQ tablet Take 1 tablet (10 mEq total) by mouth daily. 90 tablet 1  . simvastatin (ZOCOR) 10 MG tablet Take 2 tablets (20 mg total) by mouth at bedtime. (Patient taking differently: Take 10 mg by mouth at bedtime. ) 180 tablet 3   No current facility-administered medications for this visit.    Allergies:   Lipitor    Social History:  The patient  reports that he quit smoking about 25 years ago. His smoking use included Cigarettes. He has never used smokeless tobacco. He reports that he drinks alcohol. He reports that he does not use illicit drugs.   Family History:  The patient's family history includes Colon polyps in his  brother; Heart disease in his brother and father; Kidney disease in his brother; Leukemia in his father; Parkinsonism in his mother. There is no history of Prostate cancer or Colon cancer.    ROS:  Please see the history of present illness.   Otherwise, review of systems are positive for none.   All other systems are reviewed and negative.    PHYSICAL EXAM: VS:  BP 148/62 mmHg  Pulse 60  Ht 5\' 7"  (1.702 m)  Wt 215 lb 3.2 oz (97.614 kg)  BMI 33.70 kg/m2  SpO2 98% , BMI Body mass index is 33.7 kg/(m^2). GEN: Well nourished, well developed, in no acute distress HEENT: normal Neck: no JVD, carotid bruits, or masses Cardiac: RRR; no murmurs, rubs, or gallops,no edema .  The aortic closure sound is  loud and musical  Respiratory:  clear to auscultation bilaterally, normal work of breathing GI: soft, nontender, nondistended, + BS MS: no deformity or atrophy Skin: warm and dry, no rash Neuro:  Strength and sensation are intact Psych: euthymic mood, full affect   EKG:  EKG is ordered today. The ekg ordered today demonstrates sinus bradycardia with first-degree AV block.  Right bundle branch block.  Old anterior infarct.   Recent Labs: 05/25/2014: ALT 18 09/23/2014: BUN 17; Creatinine, Ser 1.29; Potassium 4.1; Sodium 133*    Lipid Panel    Component Value Date/Time   CHOL 185 05/25/2014 1119   TRIG 93.0 05/25/2014 1119   HDL 55.90 05/25/2014 1119   CHOLHDL 3 05/25/2014 1119   VLDL 18.6 05/25/2014 1119   LDLCALC 111* 05/25/2014 1119   LDLDIRECT 142.8 08/31/2011 0835      Wt Readings from Last 3 Encounters:  05/10/15 215 lb 3.2 oz (97.614 kg)  04/06/15 215 lb (97.523 kg)  03/24/15 221 lb 12.8 oz (100.608 kg)      Other studies Reviewed: Additional studies/ records that were reviewed today include: Discharge summary from [facility] on 01/16/15 Review of the above records demonstrates: Discharged on Plavix.   ASSESSMENT AND PLAN:  1. Recent left brain stroke felt to be probably  embolic , successfully treated with TPA. Implantable loop recorder in place, no atrial fibrillation discovered so far.  TEE negative for embolic source. 2. Essential hypertension, -   Will DC metooprolol and start Coreg 12. 5 BID He needs to be more careful with his salt. BP and HR check in 1 month I'll see him in 6 months   3. Prior history of a ascending aortic root dissection with emergent surgery .  Known descending aortic dissection.   He will review this with Dr. Prescott Gum.  4. Prior history of PEA arrest and pulmonary emboli postoperatively  5. Hypercholesterolemia, - there is an interaction between simva and dilt.   Will DC simva and start Rosuvastatin 5 mg a day  6. Depression    Current medicines are reviewed at length with the patient today.  The patient does not have concerns regarding medicines.  The following changes have been made:  For better blood pressure control we will increase his HCTZ up to 25 mg daily.  We are checking lab work today  Labs/ tests ordered today include: Lipid panel hepatic function panel and basal metabolic panel   No orders of the defined types were placed in this encounter.      Nahser, Wonda Cheng, MD  05/10/2015 3:50 PM    [facility] [place], [place], [place]  [address] Phone: [phone]; Fax: [phone]

## 2015-05-10 NOTE — Patient Instructions (Signed)
Medication Instructions:  STOP Metoprolol  STOP Simvastatin START Carvedilol (Coreg) 12.5 mg twice daily - take 12 hours apart START Crestor (Rosuvastatin) 5 mg once daily   Labwork: Your physician recommends that you return for lab work in: 3 months  You will need to FAST for this appointment - nothing to eat or drink after midnight the night before except water.   Testing/Procedures: None Ordered   Follow-Up: Your physician recommends that you schedule a follow-up appointment in: 1 month for Nurse visit for BP/pulse check  Your physician wants you to follow-up in: 6 months with Dr. Acie Fredrickson.  You will receive a reminder letter in the mail two months in advance. If you don't receive a letter, please call our office to schedule the follow-up appointment.   If you need a refill on your cardiac medications before your next appointment, please call your pharmacy.   Thank you for choosing [facility]! Christen Bame, RN [phone]

## 2015-05-10 NOTE — Progress Notes (Deleted)
Problem List 1. Ascending Aortic dissection - s/p repair  2  Cryptogenic stroke 3.  Essential HTN 4. Pulmonary embolus 5 . Hyperlipidemia    HPI Gabriel Tran returns today for followup. He is a pleasant 60 yo man with cryptogenic stroke, s/p ILR insertion. He has HTN. In the interim, he has been stable. He has a remote h/o aortic dissection with an exetensive repair complicated by renal failure for which he was initially on HD. He has done well in the interim but is concerned about his residual descending aortic dissection. He has no chest pain or sob. No edema. No syncope.  May 10, 2015:  Is seen today for the first visit.   Previous patient of Gabriel Tran Is typically seen annually    Allergies  Allergen Reactions  . Lipitor [Atorvastatin]     aches     Current Outpatient Prescriptions  Medication Sig Dispense Refill  . ALPRAZolam (XANAX) 0.5 MG tablet take 1 tablet by mouth every 6 hours if needed anxiety (Patient taking differently: Take 0.5 mg by mouth 2 (two) times daily as needed for anxiety. take 1 tablet by mouth every 6 hours if needed anxiety) 30 tablet 1  . clopidogrel (PLAVIX) 75 MG tablet Take 1 tablet (75 mg total) by mouth daily. 90 tablet 3  . diltiazem (CARDIZEM CD) 180 MG 24 hr capsule Take 1 capsule (180 mg total) by mouth daily. 90 capsule 1  . hydrochlorothiazide (HYDRODIURIL) 25 MG tablet Take 1 tablet (25 mg total) by mouth as directed. (Patient taking differently: Take 25 mg by mouth daily. ) 90 tablet 1  . losartan (COZAAR) 100 MG tablet Take 1 tablet (100 mg total) by mouth daily. 90 tablet 1  . metoprolol tartrate (LOPRESSOR) 25 MG tablet Take 1.5 tablets (37.5 mg total) by mouth 2 (two) times daily. 270 tablet 1  . Multiple Vitamin (MULTIVITAMIN WITH MINERALS) TABS tablet Take 1 tablet by mouth daily.    . potassium chloride (K-DUR) 10 MEQ tablet Take 1 tablet (10 mEq total) by mouth daily. 90 tablet 1  . simvastatin (ZOCOR) 10 MG tablet Take 2  tablets (20 mg total) by mouth at bedtime. (Patient taking differently: Take 10 mg by mouth at bedtime. ) 180 tablet 3   No current facility-administered medications for this visit.     Past Medical History  Diagnosis Date  . Hypertension   . Hyperglycemia     a. A1C 5.9 in Sept 2013.  Marland Kitchen RBBB   . Renal insufficiency 08/28/2012  . PE (pulmonary embolism)   . CVA (cerebral infarction)   . Aortic dissection (HCC)     ROS:   All systems reviewed and negative except as noted in the HPI.   Past Surgical History  Procedure Laterality Date  . Knee surgery      LEFT 1981  . Carpal tunnel release      2004 RIGHT  . Wisdom tooth extraction    . Thoracic aortic aneurysm repair  10/30/2011    Procedure: THORACIC ASCENDING ANEURYSM REPAIR (AAA);  Surgeon: Ivin Poot, MD;  Location: [facility];  Service: Open Heart Surgery;  Laterality: N/A;  Ascending aortic dissection repair, arch reconstruction, aortic valve repair  . Insertion of dialysis catheter  11/06/2011    Procedure: INSERTION OF DIALYSIS CATHETER;  Surgeon: Ivin Poot, MD;  Location: [facility];  Service: Thoracic;  Laterality: Right;  . Mediastinal exploration  11/06/2011    Procedure: MEDIASTINAL EXPLORATION;  Surgeon: Tharon Aquas  Kerby Less, MD;  Location: [facility];  Service: Thoracic;  Laterality: N/A;  sternal closure; removal of wound vac  . Insertion of dialysis catheter  11/13/2011    Procedure: INSERTION OF DIALYSIS CATHETER;  Surgeon: Elam Dutch, MD;  Location: [facility];  Service: Vascular;  Laterality: N/A;  Ultrasound guided.  . Cardiac valve replacement  10/2011    Aortic valve repaired  . Left heart catheterization with coronary angiogram N/A 10/30/2011    Procedure: LEFT HEART CATHETERIZATION WITH CORONARY ANGIOGRAM;  Surgeon: Burnell Blanks, MD;  Location: [facility] C[facility] LAB;  Service: Cardiovascular;  Laterality: N/A;  . Loop recorder implant N/A 01/20/2014    MDT LINQ implanted by Dr Lovena Le for cryptogenic stroke  . Tee  without cardioversion N/A 01/20/2014    Procedure: TRANSESOPHAGEAL ECHOCARDIOGRAM (TEE);  Surgeon: Candee Furbish, MD;  Location: [facility] ENDOSCOPY;  Service: Cardiovascular;  Laterality: N/A;     Family History  Problem Relation Age of Onset  . Parkinsonism Mother   . Leukemia Father     AML at 36  . Colon polyps Brother   . Kidney disease Brother   . Heart disease Brother     CABG at age 61.  . Prostate cancer Neg Hx   . Colon cancer Neg Hx   . Heart disease Father     Angioplasty in his 26s, CABG age 66.     Social History   Social History  . Marital Status: Married    Spouse Name: N/A  . Number of Children: N/A  . Years of Education: N/A   Occupational History  . Not on file.   Social History Main Topics  . Smoking status: Former Smoker    Types: Cigarettes    Quit date: 02/05/1990  . Smokeless tobacco: Never Used     Comment: Smoked for 20 years.  . Alcohol Use: 0.0 oz/week    0 Standard drinks or equivalent per week     Comment: 6 beers a week on average  . Drug Use: No  . Sexual Activity: Not on file   Other Topics Concern  . Not on file   Social History Narrative   Prev worked at [employer] with frequent long travel.    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     BP 148/62 mmHg  Pulse 60  Ht 5\' 7"  (1.702 m)  Wt 215 lb 3.2 oz (97.614 kg)  BMI 33.70 kg/m2  SpO2 98%  Physical Exam:  Well appearing middle aged man, NAD HEENT: Unremarkable Neck:  6 cm JVD, no thyromegally Back:  No CVA tenderness Lungs:  Clear with no wheezes HEART:  Regular rate rhythm, no murmurs, no rubs, no clicks Abd:  soft, positive bowel sounds, no organomegally, no rebound, no guarding Ext:  2 plus pulses, no edema, no cyanosis, no clubbing Skin:  No rashes no nodules Neuro:  CN II through XII intact, motor grossly intact  EKG - nsr with RBBB  DEVICE  Normal device function.  See PaceArt for details. No atrial fib  Assess/Plan: 1.  Cryptogenic stroke - no obvious etiology. Will monitor his ILR for atrial fib 2. Descending aortic dissection - he is asymptomatic but is quite anxious. "I feel like a time bomb". I will refer him back to Dr. PVT 3. HTN - his blood pressure is chronically elevated. If not improved on his next visit, would consider switching him to coreg from metoprolol. 4. Obesity -  he is encouraged to lose weight.  Gabriel Tran.D.

## 2015-05-16 ENCOUNTER — Ambulatory Visit (INDEPENDENT_AMBULATORY_CARE_PROVIDER_SITE_OTHER): Payer: BC Managed Care – PPO | Admitting: *Deleted

## 2015-05-16 DIAGNOSIS — I639 Cerebral infarction, unspecified: Secondary | ICD-10-CM | POA: Diagnosis not present

## 2015-05-16 NOTE — Progress Notes (Signed)
Carelink Summary Report / Loop Recorder 

## 2015-05-30 ENCOUNTER — Other Ambulatory Visit: Payer: Self-pay | Admitting: Family Medicine

## 2015-05-30 DIAGNOSIS — Z1211 Encounter for screening for malignant neoplasm of colon: Secondary | ICD-10-CM

## 2015-06-14 ENCOUNTER — Ambulatory Visit (INDEPENDENT_AMBULATORY_CARE_PROVIDER_SITE_OTHER): Payer: BC Managed Care – PPO | Admitting: *Deleted

## 2015-06-14 DIAGNOSIS — I639 Cerebral infarction, unspecified: Secondary | ICD-10-CM

## 2015-06-15 NOTE — Progress Notes (Signed)
Carelink Summary Report / Loop Recorder 

## 2015-06-20 ENCOUNTER — Ambulatory Visit (INDEPENDENT_AMBULATORY_CARE_PROVIDER_SITE_OTHER): Payer: BC Managed Care – PPO

## 2015-06-20 VITALS — BP 148/70 | HR 70 | Resp 18 | Ht 67.5 in | Wt 213.0 lb

## 2015-06-20 DIAGNOSIS — I10 Essential (primary) hypertension: Secondary | ICD-10-CM

## 2015-06-20 MED ORDER — AMLODIPINE BESYLATE 5 MG PO TABS: 5.0000 mg | ORAL_TABLET | Freq: Every day | ORAL | Status: DC

## 2015-06-20 NOTE — Progress Notes (Signed)
1.) Reason for visit: Check BP after medication changes  2.) Name of MD requesting visit: Dr. Acie Fredrickson  3.)  H&P: Patient has history of aortic dissection, HTN, PE, and CVA. Patient was started on Coreg and Crestor last office visit. Patient was suppose to discontinue metorprolol. Patient did not like how the Coreg made him       feel (headaches and "just made me feel bad"). Patient started back on Metoprolol.   3.) ROS related to problem: Vital signs today BP 140/70,  HR 48, Resp 18, O2 98% on room air.  4.) Assessment and plan per MD: Dr. Acie Fredrickson changed patient's medication to Amlodipine 5 mg by mouth daily. He wants patient to follow-up with another BP check in 3 to 4 weeks.

## 2015-06-20 NOTE — Patient Instructions (Addendum)
Medication Instructions:  Your physician has recommended you make the following change in your medication:  1-STOP Coreg and Cardizem 2-START Amlodipine 5 mg by mouth daily  Labwork: You are scheduled for fasting lab work on August 15, 2015  Testing/Procedures: None  Follow-Up: Your physician recommends that you schedule a follow-up appointment in 3-4 weeks with nurse to check BP.    If you need a refill on your cardiac medications before your next appointment, please call your pharmacy.

## 2015-06-29 LAB — CUP PACEART REMOTE DEVICE CHECK: MDC IDC SESS DTM: 20170311013632

## 2015-07-03 LAB — CUP PACEART REMOTE DEVICE CHECK: MDC IDC SESS DTM: 20170410013643

## 2015-07-03 NOTE — Progress Notes (Signed)
Carelink summary report received. Battery status OK. Normal device function. No new symptom episodes, tachy episodes, brady, or pause episodes. No new AF episodes. Monthly summary reports and ROV/PRN 

## 2015-07-14 ENCOUNTER — Ambulatory Visit (INDEPENDENT_AMBULATORY_CARE_PROVIDER_SITE_OTHER): Payer: BC Managed Care – PPO | Admitting: *Deleted

## 2015-07-14 DIAGNOSIS — I639 Cerebral infarction, unspecified: Secondary | ICD-10-CM | POA: Diagnosis not present

## 2015-07-15 NOTE — Progress Notes (Signed)
Carelink Summary Report / Loop Recorder 

## 2015-07-18 ENCOUNTER — Ambulatory Visit (INDEPENDENT_AMBULATORY_CARE_PROVIDER_SITE_OTHER): Payer: BC Managed Care – PPO | Admitting: Nurse Practitioner

## 2015-07-18 VITALS — BP 124/58 | HR 60 | Ht 67.5 in | Wt 209.4 lb

## 2015-07-18 DIAGNOSIS — I10 Essential (primary) hypertension: Secondary | ICD-10-CM | POA: Diagnosis not present

## 2015-07-18 NOTE — Progress Notes (Signed)
1.) Reason for visit: BP Check.    2.) Name of MD requesting visit: Dr. Acie Fredrickson  3.) H&P: Patient was evaluated on 5/515 for BP check after stopping metoprolol and starting carvedilol.  His BP remained elevated and Dr. Acie Fredrickson advised pt to start Amlodipine 5 mg.  Patient requested to switch back to metoprolol due to "feeling real bad" on carvedilol.  He was also advised to stop cardizem, which he did not do - he has continued the medication in addition to the amlodipine  4.) ROS related to problem: BP today is 124/58 mmHg, HR 60 bpm  5.) Assessment and plan per MD: Per Dr. Acie Fredrickson patient should stop Cardizem and continue other medications.  He is due for follow-up in September/October with Dr. Acie Fredrickson.  Patient is aware that he will receive letter to call and schedule appointment.  He verbalized understanding and agreement with plan.

## 2015-07-18 NOTE — Patient Instructions (Signed)
Medication Instructions:  STOP Diltiazem   Labwork: Return for lab appointment on July 10   Testing/Procedures: None Ordered   Follow-Up: Your physician wants you to follow-up in: September/October 2017.   You will receive a reminder letter in the mail two months in advance. If you don't receive a letter, please call our office to schedule the follow-up appointment.   If you need a refill on your cardiac medications before your next appointment, please call your pharmacy.   Thank you for choosing [facility]! Christen Bame, RN [phone]

## 2015-07-22 ENCOUNTER — Telehealth: Payer: Self-pay

## 2015-07-22 ENCOUNTER — Encounter: Payer: Self-pay | Admitting: Gastroenterology

## 2015-07-22 ENCOUNTER — Ambulatory Visit (INDEPENDENT_AMBULATORY_CARE_PROVIDER_SITE_OTHER): Payer: BC Managed Care – PPO | Admitting: Gastroenterology

## 2015-07-22 ENCOUNTER — Other Ambulatory Visit: Payer: Self-pay

## 2015-07-22 VITALS — BP 136/64 | HR 64 | Ht 67.0 in | Wt 211.8 lb

## 2015-07-22 DIAGNOSIS — I63312 Cerebral infarction due to thrombosis of left middle cerebral artery: Secondary | ICD-10-CM | POA: Diagnosis not present

## 2015-07-22 DIAGNOSIS — Z86010 Personal history of colonic polyps: Secondary | ICD-10-CM | POA: Diagnosis not present

## 2015-07-22 LAB — CUP PACEART REMOTE DEVICE CHECK
Date Time Interrogation Session: 20170510021019
Date Time Interrogation Session: 20170609021055

## 2015-07-22 MED ORDER — NA SULFATE-K SULFATE-MG SULF 17.5-3.13-1.6 GM/177ML PO SOLN: 1.0000 | Freq: Once | ORAL | Status: DC

## 2015-07-22 NOTE — Telephone Encounter (Signed)
  07/22/2015   RE: Gabriel Tran DOB: Jul 01, 1955 MRN: [id number]  Dear Dr Leonie Man,    We have scheduled the above patient for an endoscopic procedure (colonoscopy). Our records show that he is on anticoagulation therapy.   Please advise as to how long the patient may come off his therapy of Plavix prior to the procedure, which is scheduled for 08-26-2015.  Please fax back/ or route the completed form to [facility], [place] at [phone].   Sincerely,   Elias Else

## 2015-07-22 NOTE — Progress Notes (Signed)
[facility] Progress Note  Chief Complaint: History of colon polyps  Subjective History:  This is a 60 year old man last seen by our clinic in March 2012 for screening colonoscopy. He had multiple colon polyps, at least 3 of which were tubular adenoma. In 2013 he had a dissected ascending aortic aneurysm, and then into [place] 2015 he had a left sided CVA treated with tPA.'s notes indicated was suspected to be embolic, thus he was put on Plavix. I reviewed his last cardiology and neurology notes.  Demontez denies rectal bleeding abdominal pain or family history of colon rectal cancer ROS: Cardiovascular:  no chest pain Respiratory: no dyspnea  The patient's Past Medical, Family and Social History were reviewed and are on file in the EMR.  Objective:  Med list reviewed  Vital signs in last 24 hrs: Filed Vitals:   07/22/15 1038  BP: 136/64  Pulse: 64    Physical Exam   Neck: supple, no thyromegaly, JVD or lymphadenopathy  Cardiac: RRR without murmurs, S1S2 heard, no peripheral edema  Pulm: clear to auscultation bilaterally, normal RR and effort noted  Abdomen: soft, No tenderness, with active bowel sounds. No guarding or palpable hepatosplenomegaly.    @ASSESSMENTPLANBEGIN @ Assessment: Encounter Diagnoses  Name Primary?  Marland Kitchen Hx of colonic polyps Yes  . Cerebrovascular accident (CVA) due to thrombosis of left middle cerebral artery ([place])     He is in need of a surveillance colonoscopy. We will schedule it, and check with his neurologist regarding the Plavix. It seems relatively low risk for him to be off that medicine for 5 days prior to the procedure and perhaps up to a week afterwards if polyps are removed.     Nelida Meuse III

## 2015-07-22 NOTE — Patient Instructions (Signed)
If you are age 58 or older, your body mass index should be between 23-30. Your Body mass index is 33.16 kg/(m^2). If this is out of the aforementioned range listed, please consider follow up with your Primary Care Provider.  If you are age 20 or younger, your body mass index should be between 19-25. Your Body mass index is 33.16 kg/(m^2). If this is out of the aformentioned range listed, please consider follow up with your Primary Care Provider.   Thank you for choosing [facility]  Dr Wilfrid Lund III

## 2015-07-22 NOTE — Telephone Encounter (Signed)
Patient may Plavix for 5 days prior to the procedure and resume it after the procedure when safe with a small but acceptable peri-procedure and risk of TIA/stroke.if patient willing as his last stroke was in December 2015 and he has done well

## 2015-07-25 NOTE — Telephone Encounter (Signed)
Pt contacted. He is aware to hold Plavix 5 days prior to his procedure.

## 2015-08-03 ENCOUNTER — Other Ambulatory Visit: Payer: Self-pay | Admitting: *Deleted

## 2015-08-03 DIAGNOSIS — R911 Solitary pulmonary nodule: Secondary | ICD-10-CM

## 2015-08-12 ENCOUNTER — Encounter: Payer: Self-pay | Admitting: Gastroenterology

## 2015-08-13 ENCOUNTER — Ambulatory Visit: Payer: BC Managed Care – PPO

## 2015-08-15 ENCOUNTER — Ambulatory Visit (INDEPENDENT_AMBULATORY_CARE_PROVIDER_SITE_OTHER): Payer: BC Managed Care – PPO | Admitting: *Deleted

## 2015-08-15 ENCOUNTER — Other Ambulatory Visit (INDEPENDENT_AMBULATORY_CARE_PROVIDER_SITE_OTHER): Payer: BC Managed Care – PPO | Admitting: *Deleted

## 2015-08-15 DIAGNOSIS — E785 Hyperlipidemia, unspecified: Secondary | ICD-10-CM

## 2015-08-15 DIAGNOSIS — I10 Essential (primary) hypertension: Secondary | ICD-10-CM

## 2015-08-15 DIAGNOSIS — I639 Cerebral infarction, unspecified: Secondary | ICD-10-CM | POA: Diagnosis not present

## 2015-08-15 LAB — COMPREHENSIVE METABOLIC PANEL
ALBUMIN: 4.1 g/dL (ref 3.6–5.1)
ALK PHOS: 46 U/L (ref 40–115)
ALT: 20 U/L (ref 9–46)
AST: 25 U/L (ref 10–35)
BUN: 17 mg/dL (ref 7–25)
CALCIUM: 9.4 mg/dL (ref 8.6–10.3)
CO2: 29 mmol/L (ref 20–31)
Chloride: 99 mmol/L (ref 98–110)
Creat: 1.37 mg/dL — ABNORMAL HIGH (ref 0.70–1.33)
Glucose, Bld: 109 mg/dL — ABNORMAL HIGH (ref 65–99)
POTASSIUM: 3.8 mmol/L (ref 3.5–5.3)
Sodium: 136 mmol/L (ref 135–146)
TOTAL PROTEIN: 6.6 g/dL (ref 6.1–8.1)
Total Bilirubin: 0.9 mg/dL (ref 0.2–1.2)

## 2015-08-15 LAB — LIPID PANEL
CHOL/HDL RATIO: 2.1 ratio (ref <=5.0)
CHOLESTEROL: 146 mg/dL (ref 125–200)
HDL: 68 mg/dL (ref >=40)
LDL Cholesterol: 64 mg/dL (ref <130)
Triglycerides: 68 mg/dL (ref <150)
VLDL: 14 mg/dL (ref <30)

## 2015-08-15 NOTE — Addendum Note (Signed)
Addended by: Eulis Foster on: 08/15/2015 08:18 AM   Modules accepted: Orders

## 2015-08-15 NOTE — Progress Notes (Signed)
Carelink Summary Report / Loop Recorder 

## 2015-08-24 ENCOUNTER — Ambulatory Visit: Payer: BC Managed Care – PPO | Admitting: Cardiothoracic Surgery

## 2015-08-24 ENCOUNTER — Other Ambulatory Visit: Payer: BC Managed Care – PPO

## 2015-08-26 ENCOUNTER — Ambulatory Visit (AMBULATORY_SURGERY_CENTER): Payer: BC Managed Care – PPO | Admitting: Gastroenterology

## 2015-08-26 ENCOUNTER — Encounter: Payer: Self-pay | Admitting: Gastroenterology

## 2015-08-26 VITALS — BP 118/60 | HR 45 | Temp 97.8°F | Resp 16 | Ht 67.0 in | Wt 211.0 lb

## 2015-08-26 DIAGNOSIS — Z86010 Personal history of colonic polyps: Secondary | ICD-10-CM | POA: Diagnosis not present

## 2015-08-26 DIAGNOSIS — D123 Benign neoplasm of transverse colon: Secondary | ICD-10-CM | POA: Diagnosis not present

## 2015-08-26 MED ORDER — SODIUM CHLORIDE 0.9 % IV SOLN: 500.0000 mL | INTRAVENOUS | Status: DC

## 2015-08-26 NOTE — Op Note (Signed)
[facility] Patient Name: Gabriel Tran Procedure Date: 08/26/2015 1:43 PM MRN: [id number] Endoscopist: Mallie Mussel L. Loletha Carrow , MD Age: 60 Referring MD:  Date of Birth: [birth year] Gender: Male Account #: [id number] Procedure:                Colonoscopy Indications:              Surveillance: Personal history of adenomatous                            polyps on last colonoscopy > 5 years ago Medicines:                Monitored Anesthesia Care Procedure:                Pre-Anesthesia Assessment:                           - Prior to the procedure, a History and Physical                            was performed, and patient medications and                            allergies were reviewed. The patient's tolerance of                            previous anesthesia was also reviewed. The risks                            and benefits of the procedure and the sedation                            options and risks were discussed with the patient.                            All questions were answered, and informed consent                            was obtained. Prior Anticoagulants: The patient has                            taken Plavix (clopidogrel), last dose was 5 days                            prior to procedure. ASA Grade Assessment: III - A                            patient with severe systemic disease. After                            reviewing the risks and benefits, the patient was                            deemed in satisfactory condition to undergo the  procedure.                           After obtaining informed consent, the colonoscope                            was passed under direct vision. Throughout the                            procedure, the patient's blood pressure, pulse, and                            oxygen saturations were monitored continuously. The                            Model CF-HQ190L [id number]) scope was introduced                       through the anus and advanced to the the cecum,                            identified by appendiceal orifice and ileocecal                            valve. The colonoscopy was performed without                            difficulty. The patient tolerated the procedure                            well. The quality of the bowel preparation was                            excellent. The ileocecal valve, appendiceal                            orifice, and rectum were photographed. Scope In: 1:53:05 PM Scope Out: 2:07:30 PM Scope Withdrawal Time: 0 hours 10 minutes 34 seconds  Total Procedure Duration: 0 hours 14 minutes 25 seconds  Findings:                 The perianal and digital rectal examinations were                            normal.                           Three sessile polyps were found in the distal                            transverse colon and hepatic flexure. The polyps                            were 2 to 6 mm in size. These polyps were removed  with a cold snare. Resection was complete, but the                            36mm polyp was not retrieved.                           The exam was otherwise without abnormality on                            direct and retroflexion views. Complications:            No immediate complications. Estimated Blood Loss:     Estimated blood loss: none. Impression:               - Three 2 to 6 mm polyps in the distal transverse                            colon and at the hepatic flexure, removed with a                            cold snare. Complete resection. Partial retrieval.                           - The examination was otherwise normal on direct                            and retroflexion views. Recommendation:           - Resume previous diet.                           - Resume Plavix (clopidogrel) at prior dose in 3                            days.                           - Await  pathology results.                           - Repeat colonoscopy is recommended for                            surveillance. The colonoscopy date will be                            determined after pathology results from today's                            exam become available for review. Suhailah Kwan L. Loletha Carrow, MD 08/26/2015 2:15:10 PM This report has been signed electronically.

## 2015-08-26 NOTE — Patient Instructions (Signed)
YOU HAD AN ENDOSCOPIC PROCEDURE TODAY AT [facility]:   Refer to the procedure report that was given to you for any specific questions about what was found during the examination.  If the procedure report does not answer your questions, please call your gastroenterologist to clarify.  If you requested that your care partner not be given the details of your procedure findings, then the procedure report has been included in a sealed envelope for you to review at your convenience later.  YOU SHOULD EXPECT: Some feelings of bloating in the abdomen. Passage of more gas than usual.  Walking can help get rid of the air that was put into your GI tract during the procedure and reduce the bloating. If you had a lower endoscopy (such as a colonoscopy or flexible sigmoidoscopy) you may notice spotting of blood in your stool or on the toilet paper. If you underwent a bowel prep for your procedure, you may not have a normal bowel movement for a few days.  Please Note:  You might notice some irritation and congestion in your nose or some drainage.  This is from the oxygen used during your procedure.  There is no need for concern and it should clear up in a day or so.  SYMPTOMS TO REPORT IMMEDIATELY:   Following lower endoscopy (colonoscopy or flexible sigmoidoscopy):  Excessive amounts of blood in the stool  Significant tenderness or worsening of abdominal pains  Swelling of the abdomen that is new, acute  Fever of 100F or higher   For urgent or emergent issues, a gastroenterologist can be reached at any hour by calling [phone].   DIET: Your first meal following the procedure should be a small meal and then it is ok to progress to your normal diet. Heavy or fried foods are harder to digest and may make you feel nauseous or bloated.  Likewise, meals heavy in dairy and vegetables can increase bloating.  Drink plenty of fluids but you should avoid alcoholic beverages for 24  hours.  ACTIVITY:  You should plan to take it easy for the rest of today and you should NOT DRIVE or use heavy machinery until tomorrow (because of the sedation medicines used during the test).    FOLLOW UP: Our staff will call the number listed on your records the next business day following your procedure to check on you and address any questions or concerns that you may have regarding the information given to you following your procedure. If we do not reach you, we will leave a message.  However, if you are feeling well and you are not experiencing any problems, there is no need to return our call.  We will assume that you have returned to your regular daily activities without incident.  If any biopsies were taken you will be contacted by phone or by letter within the next 1-3 weeks.  Please call us at [phone] if you have not heard about the biopsies in 3 weeks.    SIGNATURES/CONFIDENTIALITY: You and/or your care partner have signed paperwork which will be entered into your electronic medical record.  These signatures attest to the fact that that the information above on your After Visit Summary has been reviewed and is understood.  Full responsibility of the confidentiality of this discharge information lies with you and/or your care-partner.  Restart your plavix on Monday per Dr. Loletha Carrow.

## 2015-08-26 NOTE — Progress Notes (Signed)
Called to room to assist during endoscopic procedure.  Patient ID and intended procedure confirmed with present staff. Received instructions for my participation in the procedure from the performing physician.  

## 2015-08-26 NOTE — Progress Notes (Signed)
Report to PACU, RN, vss, BBS= Clear.  

## 2015-08-29 ENCOUNTER — Telehealth: Payer: Self-pay | Admitting: *Deleted

## 2015-08-29 NOTE — Telephone Encounter (Signed)
  Follow up Call-  Call back number 08/26/2015  Post procedure Call Back phone  # [phone]  Permission to leave phone message Yes  Some recent data might be hidden     Patient questions:  Do you have a fever, pain , or abdominal swelling? No. Pain Score  0 *  Have you tolerated food without any problems? Yes.    Have you been able to return to your normal activities? Yes.    Do you have any questions about your discharge instructions: Diet   No. Medications  No. Follow up visit  No.  Do you have questions or concerns about your Care? No.  Actions: * If pain score is 4 or above: No action needed, pain <4.

## 2015-09-06 ENCOUNTER — Encounter: Payer: Self-pay | Admitting: Gastroenterology

## 2015-09-07 LAB — CUP PACEART REMOTE DEVICE CHECK: MDC IDC SESS DTM: 20170709030754

## 2015-09-07 NOTE — Progress Notes (Signed)
Carelink summary report received. Battery status OK. Normal device function. No new symptom episodes, tachy episodes, brady, or pause episodes. No new AF episodes. Monthly summary reports and ROV/PRN 

## 2015-09-12 ENCOUNTER — Ambulatory Visit (INDEPENDENT_AMBULATORY_CARE_PROVIDER_SITE_OTHER): Payer: BC Managed Care – PPO | Admitting: *Deleted

## 2015-09-12 DIAGNOSIS — I639 Cerebral infarction, unspecified: Secondary | ICD-10-CM | POA: Diagnosis not present

## 2015-09-13 NOTE — Progress Notes (Signed)
Carelink Summary Report / Loop Recorder 

## 2015-09-19 LAB — CUP PACEART REMOTE DEVICE CHECK: Date Time Interrogation Session: 20170808030843

## 2015-09-21 ENCOUNTER — Ambulatory Visit (INDEPENDENT_AMBULATORY_CARE_PROVIDER_SITE_OTHER): Payer: BC Managed Care – PPO | Admitting: Cardiothoracic Surgery

## 2015-09-21 ENCOUNTER — Ambulatory Visit
Admission: RE | Admit: 2015-09-21 | Discharge: 2015-09-21 | Disposition: A | Discharge status: Home / Self Care | Payer: BC Managed Care – PPO | Source: Ambulatory Visit | Attending: Cardiothoracic Surgery | Admitting: Cardiothoracic Surgery

## 2015-09-21 ENCOUNTER — Encounter: Payer: Self-pay | Admitting: Cardiothoracic Surgery

## 2015-09-21 VITALS — BP 138/72 | HR 62 | Resp 20 | Ht 67.0 in | Wt 211.0 lb

## 2015-09-21 DIAGNOSIS — I7101 Dissection of thoracic aorta: Secondary | ICD-10-CM

## 2015-09-21 DIAGNOSIS — R911 Solitary pulmonary nodule: Secondary | ICD-10-CM

## 2015-09-21 DIAGNOSIS — I71019 Dissection of thoracic aorta, unspecified: Secondary | ICD-10-CM

## 2015-09-21 NOTE — Progress Notes (Signed)
PCP is Elsie Stain, MD Referring Provider is Nahser, Wonda Cheng, MD  Chief Complaint  Patient presents with  . Lung Lesion    4 month f/u with Chest CT, HX of  thoracic aortic aneurysm repair    [id number] follow-up with CT scan after emergency repair of type a aortic dissection 2013. Also follow-up of left upper lobe density noted in 2017 with negative PET scan. The patient is asymptomatic without productive cough or chest pain. The patient takes his blood pressure medications-losartan Norvasc and metoprolol daily. His CT scan performed today and PET scan performed 3 months ago are personally reviewed and discussed-counseled with patient. CT scan today without contrast shows the aortic size to remain within the safe range. The ascending aorta measures at 4.4 cm. The previously noted left upper lobe infiltrated density remains totally resolved. There is no other at risk pulmonary nodules or mediastinal nodes.  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 He tries to follow healthy diet. He checks his weight regularly. A recent surveillance colonoscopy removed 2 benign polyps.   Past Medical History:  Diagnosis Date  . Aortic dissection ([place])   . CVA (cerebral infarction)   . Hyperglycemia    a. A1C 5.9 in Sept 2013.  Marland Kitchen Hypertension   . PE (pulmonary embolism)   . RBBB   . Renal insufficiency 08/28/2012  . Sleep apnea   . Stroke [facility])     Past Surgical History:  Procedure Laterality Date  . CARPAL TUNNEL RELEASE     2004 RIGHT  . COLONOSCOPY    . INSERTION OF DIALYSIS CATHETER  11/06/2011   Procedure: INSERTION OF DIALYSIS CATHETER;  Surgeon: Ivin Poot, MD;  Location: [place];  Service: Thoracic;  Laterality: Right;  . INSERTION OF DIALYSIS CATHETER  11/13/2011   Procedure: INSERTION OF DIALYSIS CATHETER;  Surgeon: Elam Dutch, MD;  Location: [place];  Service: Vascular;  Laterality: N/A;  Ultrasound guided.   Marland Kitchen KNEE SURGERY     LEFT 1981  . LEFT HEART CATHETERIZATION WITH CORONARY ANGIOGRAM N/A 10/30/2011   Procedure: LEFT HEART CATHETERIZATION WITH CORONARY ANGIOGRAM;  Surgeon: Burnell Blanks, MD;  Location: [facility] CATH LAB;  Service: Cardiovascular;  Laterality: N/A;  . LOOP RECORDER IMPLANT N/A 01/20/2014   MDT LINQ implanted by Dr Lovena Le for cryptogenic stroke  . MEDIASTINAL EXPLORATION  11/06/2011   Procedure: MEDIASTINAL EXPLORATION;  Surgeon: Ivin Poot, MD;  Location: [place];  Service: Thoracic;  Laterality: N/A;  sternal closure; removal of wound vac  . POLYPECTOMY    . TEE WITHOUT CARDIOVERSION N/A 01/20/2014   Procedure: TRANSESOPHAGEAL ECHOCARDIOGRAM (TEE);  Surgeon: Candee Furbish, MD;  Location: [place];  Service: Cardiovascular;  Laterality: N/A;  . THORACIC AORTIC ANEURYSM REPAIR  10/30/2011   Procedure: THORACIC ASCENDING ANEURYSM REPAIR (AAA);  Surgeon: Ivin Poot, MD;  Location: [place];  Service: Open Heart Surgery;  Laterality: N/A;  Ascending aortic dissection repair, arch reconstruction, aortic valve repair  . WISDOM TOOTH EXTRACTION      Family History  Problem Relation Age of Onset  . Parkinsonism Mother   . Leukemia Father     AML at 36  . Colon polyps Brother   . Kidney disease Brother   . Heart disease Brother     CABG at age 7.  . Prostate cancer Neg Hx   . Colon cancer Neg Hx   . Heart disease Father  Angioplasty in his 69s, CABG age 19.    Social History Social History  Substance Use Topics  . Smoking status: Former Smoker    Types: Cigarettes    Quit date: 02/05/1990  . Smokeless tobacco: Never Used     Comment: Smoked for 20 years.  . Alcohol use 0.0 oz/week     Comment: 6 beers a week on average    Current Outpatient Prescriptions  Medication Sig Dispense Refill  . ALPRAZolam (XANAX) 0.5 MG tablet take 1 tablet by mouth every 6 hours if needed anxiety (Patient taking differently: Take 0.5 mg by mouth 2 (two) times daily as needed  for anxiety. take 1 tablet by mouth every 6 hours if needed anxiety) 30 tablet 1  . amLODipine (NORVASC) 5 MG tablet Take 1 tablet (5 mg total) by mouth daily. 90 tablet 3  . clopidogrel (PLAVIX) 75 MG tablet Take 1 tablet (75 mg total) by mouth daily. 90 tablet 3  . hydrochlorothiazide (HYDRODIURIL) 25 MG tablet Take 1 tablet (25 mg total) by mouth as directed. (Patient taking differently: Take 25 mg by mouth daily. ) 90 tablet 1  . losartan (COZAAR) 100 MG tablet Take 1 tablet (100 mg total) by mouth daily. 90 tablet 1  . metoprolol tartrate (LOPRESSOR) 25 MG tablet Take 37.5 mg by mouth 2 (two) times daily.    . Multiple Vitamin (MULTIVITAMIN WITH MINERALS) TABS tablet Take 1 tablet by mouth daily.    . potassium chloride (K-DUR) 10 MEQ tablet Take 1 tablet (10 mEq total) by mouth daily. 90 tablet 1  . rosuvastatin (CRESTOR) 5 MG tablet Take 1 tablet (5 mg total) by mouth daily. 30 tablet 11   No current facility-administered medications for this visit.     Allergies  Allergen Reactions  . Carvedilol     "felt terrible", headache  . Lipitor [Atorvastatin]     aches    Review of Systems        Review of Systems :  [ y ] = yes, [  ] = no        General :  Weight gain [   ]    Weight loss  [   ]  Fatigue [  ]  Fever [  ]  Chills  [  ]                                Weakness  [  ]           HEENT    Headache [  ]  Dizziness [  ]  Blurred vision [  ] Glaucoma  [  ]                          Nosebleeds [  ] Painful or loose teeth [  ]        Cardiac :  Chest pain/ pressure [  ]  Resting SOB [  ] exertional SOB [  ]                        Orthopnea [  ]  Pedal edema  [  ]  Palpitations [  ] Syncope/presyncope [ ]                         Paroxysmal nocturnal dyspnea [  ]  Pulmonary : cough [  ]  wheezing [  ]  Hemoptysis [  ] Sputum [  ] Snoring [  ]                              Pneumothorax [  ]  Sleep apnea [  ]        GI : Vomiting [  ]  Dysphagia [  ]  Melena  [  ]   Abdominal pain [  ] BRBPR [  ]              Heart burn [  ]  Constipation [  ] Diarrhea  [  ] Colonoscopy [   ]        GU : Hematuria [  ]  Dysuria [  ]  Nocturia [  ] UTI's [  ]        Vascular : Claudication [  ]  Rest pain [  ]  DVT [  ] Vein stripping [  ] leg ulcers [  ]                          TIA [  ] Stroke [  ]  Varicose veins [  ]        NEURO :  Headaches  [  ] Seizures [  ] Vision changes [  ] Paresthesias [  ]                                       Seizures [  ]        Musculoskeletal :  Arthritis [  ] Gout  [  ]  Back pain [ lower back pain chronic ]  Joint pain [  ]        Skin :  Rash [  ]  Melanoma [  ] Sores [  ]        Heme : Bleeding problems [  ]Clotting Disorders [  ] Anemia [  ]Blood Transfusion [ ]         Endocrine : Diabetes [  ] Heat or Cold intolerance [  ] Polyuria [  ]excessive thirst [ ]         Psych : Depression [  ]  Anxiety [  ]  Psych hospitalizations [  ] Memory change [  ]                                               BP 138/72 (BP Location: Left Arm, Patient Position: Sitting, Cuff Size: Normal)   Pulse 62   Resp 20   Ht 5\' 7"  (1.702 m)   Wt 211 lb (95.7 kg)   SpO2 98% Comment: RA  BMI 33.05 kg/m  Physical Exam       Exam    General- alert and comfortable   Lungs- clear without rales, wheezes   Cor- regular rate and rhythm, no murmur , gallop   Abdomen- soft, non-tender   Extremities - warm, non-tender, minimal edema   Neuro- oriented, appropriate, no focal weakness   ENT-no JVD or cervical adenopathy   Diagnostic Tests:  CT of chest-stable  repair of ascending aortic type A dissection Resolution of left upper lobe nodular density seen earlier this year   Impression: Patient doing well. He mainly needs to be quite compliant about his blood pressure medication and having his blood pressure checked regularly. The patient has mild- moderate aortic insufficiency and is followed by his cardiologist Dr.  Acie Fredrickson  Plan: We will  plan a surveillance CT scan contrast of his thoracic aorta one year.  Len Childs, MD [facility] [phone]

## 2015-09-29 ENCOUNTER — Other Ambulatory Visit: Payer: Self-pay | Admitting: Family Medicine

## 2015-09-29 NOTE — Telephone Encounter (Signed)
Electronic refill request. Last office visit:   10/01/14 CPE.  No upcoming appt scheduled.  Last Filled: Amlodipine   90 tablet 3 06/20/2015  Hydrochlorothiazide    90 tablet 1 03/31/2015  Losartan  90 tablet 1 03/31/2015  Potassium   90 tablet 1 03/31/2015  Please advise.

## 2015-09-30 NOTE — Telephone Encounter (Signed)
Patient advised.  Patient will take a look at his calendar and call back to schedule CPE.

## 2015-09-30 NOTE — Telephone Encounter (Signed)
Due for CPE.  Sent. Thanks.

## 2015-10-02 ENCOUNTER — Other Ambulatory Visit: Payer: Self-pay | Admitting: Family Medicine

## 2015-10-06 ENCOUNTER — Ambulatory Visit: Payer: 59 | Admitting: Neurology

## 2015-10-12 ENCOUNTER — Ambulatory Visit (INDEPENDENT_AMBULATORY_CARE_PROVIDER_SITE_OTHER): Payer: BC Managed Care – PPO | Admitting: *Deleted

## 2015-10-12 DIAGNOSIS — I639 Cerebral infarction, unspecified: Secondary | ICD-10-CM | POA: Diagnosis not present

## 2015-10-13 NOTE — Progress Notes (Signed)
Carelink Summary Report / Loop Recorder 

## 2015-11-11 ENCOUNTER — Ambulatory Visit (INDEPENDENT_AMBULATORY_CARE_PROVIDER_SITE_OTHER): Payer: BC Managed Care – PPO | Admitting: *Deleted

## 2015-11-11 DIAGNOSIS — I639 Cerebral infarction, unspecified: Secondary | ICD-10-CM

## 2015-11-12 LAB — CUP PACEART REMOTE DEVICE CHECK: Date Time Interrogation Session: 20170907030703

## 2015-11-12 NOTE — Progress Notes (Signed)
Carelink summary report received. Battery status OK. Normal device function. No new symptom episodes, tachy episodes, brady, or pause episodes. No new AF episodes. Monthly summary reports and ROV/PRN 

## 2015-11-14 NOTE — Progress Notes (Signed)
Carelink Summary Report / Loop Recorder 

## 2015-11-17 ENCOUNTER — Other Ambulatory Visit: Payer: Self-pay | Admitting: Family Medicine

## 2015-11-17 ENCOUNTER — Other Ambulatory Visit (INDEPENDENT_AMBULATORY_CARE_PROVIDER_SITE_OTHER): Payer: BC Managed Care – PPO

## 2015-11-17 DIAGNOSIS — R739 Hyperglycemia, unspecified: Secondary | ICD-10-CM | POA: Diagnosis not present

## 2015-11-17 LAB — HEMOGLOBIN A1C: Hgb A1c MFr Bld: 5.4 % (ref 4.6–6.5)

## 2015-11-17 LAB — BASIC METABOLIC PANEL
BUN: 15 mg/dL (ref 6–23)
CHLORIDE: 94 meq/L — AB (ref 96–112)
CO2: 32 meq/L (ref 19–32)
CREATININE: 1.22 mg/dL (ref 0.40–1.50)
Calcium: 9.9 mg/dL (ref 8.4–10.5)
GFR: 64.82 mL/min (ref >60.00)
GLUCOSE: 105 mg/dL — AB (ref 70–99)
POTASSIUM: 4.4 meq/L (ref 3.5–5.1)
Sodium: 130 mEq/L — ABNORMAL LOW (ref 135–145)

## 2015-11-22 ENCOUNTER — Encounter: Payer: Self-pay | Admitting: Family Medicine

## 2015-11-22 ENCOUNTER — Ambulatory Visit (INDEPENDENT_AMBULATORY_CARE_PROVIDER_SITE_OTHER): Payer: BC Managed Care – PPO | Admitting: Family Medicine

## 2015-11-22 VITALS — BP 130/68 | HR 64 | Temp 98.7°F | Ht 67.0 in | Wt 210.2 lb

## 2015-11-22 DIAGNOSIS — F419 Anxiety disorder, unspecified: Secondary | ICD-10-CM

## 2015-11-22 DIAGNOSIS — Z23 Encounter for immunization: Secondary | ICD-10-CM

## 2015-11-22 DIAGNOSIS — Z0000 Encounter for general adult medical examination without abnormal findings: Secondary | ICD-10-CM

## 2015-11-22 DIAGNOSIS — M545 Low back pain, unspecified: Secondary | ICD-10-CM

## 2015-11-22 DIAGNOSIS — I7100 Dissection of unspecified site of aorta: Secondary | ICD-10-CM

## 2015-11-22 DIAGNOSIS — I10 Essential (primary) hypertension: Secondary | ICD-10-CM

## 2015-11-22 DIAGNOSIS — R739 Hyperglycemia, unspecified: Secondary | ICD-10-CM

## 2015-11-22 MED ORDER — POTASSIUM CHLORIDE ER 10 MEQ PO TBCR: EXTENDED_RELEASE_TABLET | ORAL | 3 refills | Status: DC

## 2015-11-22 MED ORDER — HYDROCHLOROTHIAZIDE 25 MG PO TABS: 25.0000 mg | ORAL_TABLET | Freq: Every day | ORAL | 3 refills | Status: DC

## 2015-11-22 MED ORDER — CYCLOBENZAPRINE HCL 10 MG PO TABS: 5.0000 mg | ORAL_TABLET | Freq: Three times a day (TID) | ORAL | 0 refills | Status: DC | PRN

## 2015-11-22 MED ORDER — LOSARTAN POTASSIUM 100 MG PO TABS: ORAL_TABLET | ORAL | 3 refills | Status: DC

## 2015-11-22 MED ORDER — ALPRAZOLAM 0.5 MG PO TABS: 0.5000 mg | ORAL_TABLET | Freq: Two times a day (BID) | ORAL | 1 refills | Status: DC | PRN

## 2015-11-22 MED ORDER — METOPROLOL TARTRATE 25 MG PO TABS: ORAL_TABLET | ORAL | 3 refills | Status: DC

## 2015-11-22 NOTE — Assessment & Plan Note (Addendum)
Per CVTS, lipids per cards.  BP controlled.  No sx.
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
## 2015-11-22 NOTE — Assessment & Plan Note (Addendum)
D/w pt about diet and exercise and labs.  

## 2015-11-22 NOTE — Patient Instructions (Signed)
Try using flexeril for your back and gently stretch.  Update me as needed.   Take care.  Glad to see you.

## 2015-11-22 NOTE — Assessment & Plan Note (Addendum)
Continue prn BZD use, rare use.

## 2015-11-22 NOTE — Assessment & Plan Note (Addendum)
Tetanus 2016 Flu 2017 Shingles not due.  PNA 2017 Colonoscopy 2017 Prostate cancer screening and PSA options (with potential risks and benefits of testing vs not testing) were discussed along with recent recs/guidelines. He declined testing PSA at this point. Living will d/w pt. Wife designated if patient were incapacitated.  Diet and exercise d/w pt. Has a treadmill at home. Encouraged diet and moderate exercise.

## 2015-11-22 NOTE — Assessment & Plan Note (Addendum)
lipids per cards.  BP controlled.  No sx.  Low Na likely due to HCTZ, chronic, unchanged.  D/w pt.  Continue as is.

## 2015-11-22 NOTE — Assessment & Plan Note (Addendum)
Likely intermittent MSK source, can try flexeril with sedation caution initially, with stretching.  Update me as needed.  Cr okay, may be a candidate for low dose nsaid in the future depending on his other meds and situation at the time. D/w pt.

## 2015-11-22 NOTE — Progress Notes (Signed)
Pre visit review using our clinic review tool, if applicable. No additional management support is needed unless otherwise documented below in the visit note. 

## 2015-12-12 ENCOUNTER — Ambulatory Visit (INDEPENDENT_AMBULATORY_CARE_PROVIDER_SITE_OTHER): Payer: BC Managed Care – PPO | Admitting: *Deleted

## 2015-12-12 DIAGNOSIS — I639 Cerebral infarction, unspecified: Secondary | ICD-10-CM

## 2015-12-13 NOTE — Progress Notes (Signed)
Carelink Summary Report / Loop Recorder 

## 2015-12-18 LAB — CUP PACEART REMOTE DEVICE CHECK
Date Time Interrogation Session: 20171007032624
MDC IDC PG IMPLANT DT: 20151216

## 2015-12-18 NOTE — Progress Notes (Signed)
Carelink summary report received. Battery status OK. Normal device function. No new symptom episodes, tachy episodes, brady, or pause episodes. No new AF episodes. Monthly summary reports and ROV/PRN 

## 2016-01-05 ENCOUNTER — Ambulatory Visit (INDEPENDENT_AMBULATORY_CARE_PROVIDER_SITE_OTHER): Payer: BC Managed Care – PPO | Admitting: Physician Assistant

## 2016-01-05 VITALS — BP 152/70 | HR 69 | Temp 98.3°F | Resp 20 | Ht 67.0 in | Wt 210.4 lb

## 2016-01-05 DIAGNOSIS — B9789 Other viral agents as the cause of diseases classified elsewhere: Secondary | ICD-10-CM | POA: Diagnosis not present

## 2016-01-05 DIAGNOSIS — J069 Acute upper respiratory infection, unspecified: Secondary | ICD-10-CM | POA: Diagnosis not present

## 2016-01-05 MED ORDER — GUAIFENESIN ER 1200 MG PO TB12: 1.0000 | ORAL_TABLET | Freq: Two times a day (BID) | ORAL | 1 refills | Status: DC | PRN

## 2016-01-05 MED ORDER — DOXYCYCLINE HYCLATE 100 MG PO CAPS: 100.0000 mg | ORAL_CAPSULE | Freq: Two times a day (BID) | ORAL | 0 refills | Status: AC

## 2016-01-05 MED ORDER — BENZONATATE 100 MG PO CAPS: 100.0000 mg | ORAL_CAPSULE | Freq: Three times a day (TID) | ORAL | 0 refills | Status: DC | PRN

## 2016-01-05 MED ORDER — AZELASTINE HCL 0.15 % NA SOLN: 2.0000 | Freq: Two times a day (BID) | NASAL | 0 refills | Status: DC

## 2016-01-05 NOTE — Progress Notes (Signed)
Subjective:    Patient ID: Gabriel Tran, male    DOB: Sep 06, 1955, 60 y.o.   MRN: [id number]  Chief Complaint  Patient presents with  . Sinusitis  . Nasal Congestion   HPI: Presents for nasal congestion which began 4 days ago. States initially he felt congestion and pressure in his sinuses and then yesterday it progressed to more chest congestion and productive cough with thick yellow sputum. Endorses scratchy throat, subjective fever, night sweats, malaise, fatigue, post-nasal drip and watery drainage from eyes. States his chest has a "burning" sensation with the coughing. Additional associated symptoms include decreased appetite, and headaches. Believes it could be related to when he was outside last week blowing leaves and doing lawn work when he was exposed to a lot of dust and debris. Notes use of Neti pot at home along with Sudafed and Ibuprofen which has provided mild relief but symptoms have still persisted. Denies history of environmental allergies, no use of daily antihistamine.   Lung nodule incidentally found on chest CT last year which was followed up with PET scan and biopsy and then spontaneously resolved.   Extensive cardiac history including hypertension, aortic dissection, cardiac tamponade, CVA, and pulmonary embolism all which occurred within a short time period in 2013. States he stopped Plavix 2 days ago due to his illness. Endorses checking his blood pressure at home and states it usually measures around SBP 125-145 mmHg and DBP 70-85 mmHg. Followed by cardiology.  States he received pneumonia and flu vaccine earlier this year.   Review of Systems  Constitutional: Positive for appetite change, diaphoresis, fatigue and fever. Negative for chills.  HENT: Positive for congestion, postnasal drip, sinus pain and sinus pressure. Negative for ear discharge, ear pain, hearing loss, sore throat and tinnitus.   Eyes: Positive for discharge. Negative for pain, redness and  itching.  Respiratory: Positive for cough, chest tightness and wheezing. Negative for shortness of breath.   Cardiovascular: Negative for chest pain and palpitations.  Gastrointestinal: Negative for abdominal pain, constipation, diarrhea, nausea and vomiting.  Genitourinary: Negative for dysuria, frequency, hematuria and urgency.  Musculoskeletal: Positive for myalgias.  Neurological: Positive for headaches. Negative for dizziness and light-headedness.   Allergies  Allergen Reactions  . Carvedilol     "felt terrible", headache  . Lipitor [Atorvastatin]     aches   Prior to Admission medications   Medication Sig Start Date End Date Taking? Authorizing Provider  ALPRAZolam Duanne Moron) 0.5 MG tablet Take 1 tablet (0.5 mg total) by mouth 2 (two) times daily as needed for anxiety. 11/22/15   Tonia Ghent, MD  amLODipine (NORVASC) 5 MG tablet Take 1 tablet (5 mg total) by mouth daily. 06/20/15   Thayer Headings, MD  clopidogrel (PLAVIX) 75 MG tablet Take 1 tablet (75 mg total) by mouth daily. 04/15/15   Evans Lance, MD  cyclobenzaprine (FLEXERIL) 10 MG tablet Take 0.5-1 tablets (5-10 mg total) by mouth 3 (three) times daily as needed for muscle spasms (sedation caution). Patient not taking: Reported on 01/05/2016 11/22/15   Tonia Ghent, MD  hydrochlorothiazide (HYDRODIURIL) 25 MG tablet Take 1 tablet (25 mg total) by mouth daily. as directed 11/22/15   Tonia Ghent, MD  losartan (COZAAR) 100 MG tablet TAKE 1 TABLET(100 MG) BY MOUTH DAILY 11/22/15   Tonia Ghent, MD  metoprolol tartrate (LOPRESSOR) 25 MG tablet TAKE 1 AND 1/2 TABLETS(37.5 MG) BY MOUTH TWICE DAILY 11/22/15   Tonia Ghent, MD  Multiple Vitamin (  MULTIVITAMIN WITH MINERALS) TABS tablet Take 1 tablet by mouth daily.    Historical Provider, MD  potassium chloride (K-DUR) 10 MEQ tablet TAKE 1 TABLET(10 MEQ) BY MOUTH DAILY 11/22/15   Tonia Ghent, MD  rosuvastatin (CRESTOR) 5 MG tablet Take 1 tablet (5 mg total) by mouth  daily. 05/10/15   Thayer Headings, MD   Patient Active Problem List   Diagnosis Date Noted  . Anxiety 10/13/2014  . Actinic keratosis 10/08/2014  . CVA (cerebral vascular accident) ([place])   . Depression 01/18/2014  . Cerebral infarct ([place])   . Stroke ([place]) 01/13/2014  . Advance care planning 08/18/2013  . Pulmonary embolism ([place]) 12/04/2011  . ESRD (end stage renal disease) ([place]) 12/04/2011  . A-fib ([place]) 11/09/2011  . Aortic dissection ([place]) 10/31/2011  . RBBB 10/30/2011  . HTN (hypertension) 10/29/2011  . Routine general medical examination at a health care facility 09/07/2011  . Back pain 09/07/2011  . HLD (hyperlipidemia) 06/11/2007  . Benign hypertensive heart disease without heart failure 06/11/2007  . PSORIASIS 06/11/2007  . Hyperglycemia 06/11/2007       Objective: Blood pressure (!) 152/70, pulse 69, temperature 98.3 F (36.8 C), temperature source Oral, resp. rate 20, height 5\' 7"  (1.702 m), weight 210 lb 6.4 oz (95.4 kg), SpO2 97 %.   Physical Exam  Constitutional: He is oriented to person, place, and time. He appears well-developed and well-nourished. No distress.  HENT:  Head: Normocephalic and atraumatic.  Right Ear: External ear normal. No drainage, swelling or tenderness. Tympanic membrane is not injected, not scarred, not perforated, not erythematous, not retracted and not bulging. No middle ear effusion. No hemotympanum.  Left Ear: External ear normal. No drainage, swelling or tenderness. Tympanic membrane is not injected, not scarred, not perforated, not erythematous, not retracted and not bulging.  No middle ear effusion. No hemotympanum.  Nose: Mucosal edema and rhinorrhea present. No nose lacerations, sinus tenderness, nasal deformity, septal deviation or nasal septal hematoma. No epistaxis.  No foreign bodies. Right sinus exhibits maxillary sinus tenderness. Right sinus exhibits no frontal sinus tenderness. Left sinus exhibits maxillary sinus tenderness. Left  sinus exhibits no frontal sinus tenderness.  Mouth/Throat: Uvula is midline and mucous membranes are normal. Mucous membranes are not pale, not dry and not cyanotic. No oral lesions. Normal dentition. No dental abscesses. Posterior oropharyngeal erythema present. No oropharyngeal exudate.  Mild maxillary sinus tenderness.  Eyes: Conjunctivae are normal. Pupils are equal, round, and reactive to light. Right eye exhibits no discharge and no exudate. Left eye exhibits no discharge and no exudate. Right conjunctiva is not injected. Right conjunctiva has no hemorrhage. Left conjunctiva is not injected. Left conjunctiva has no hemorrhage. No scleral icterus. Right pupil is round and reactive. Left pupil is round and reactive. Pupils are equal.  Neck: Normal range of motion. Neck supple. No tracheal deviation present. No thyromegaly present.  Cardiovascular: Normal rate, regular rhythm, S1 normal, S2 normal and normal heart sounds.  Exam reveals no gallop, no distant heart sounds and no friction rub.   No murmur heard. Pulses:      Radial pulses are 2+ on the right side, and 2+ on the left side.  Pulmonary/Chest: Effort normal and breath sounds normal. No accessory muscle usage or stridor. No tachypnea and no bradypnea. No respiratory distress. He has no decreased breath sounds. He has no wheezes. He has no rhonchi. He has no rales.  Lymphadenopathy:       Head (right side): No submental, no  submandibular, no tonsillar, no preauricular and no posterior auricular adenopathy present.       Head (left side): No submental, no submandibular, no tonsillar, no preauricular and no posterior auricular adenopathy present.    He has no cervical adenopathy.  Neurological: He is alert and oriented to person, place, and time.  Skin: Skin is warm and dry. No rash noted. He is not diaphoretic. No erythema.  Psychiatric: He has a normal mood and affect.       Assessment & Plan:  1. Viral URI with cough Advised patient  to use Tessalon perles, Azelastine HCl nasal spray, and Mucinex for the next 2-3 days for symptomatic relief of likely viral URI. Rx for Doxycycline provided and instructed patient to fill if symptoms are worsening or not improving with medications provided. RTC if symptoms worsen or do not improve or other issues arise. - benzonatate (TESSALON) 100 MG capsule; Take 1-2 capsules (100-200 mg total) by mouth 3 (three) times daily as needed for cough.  Dispense: 40 capsule; Refill: 0 - Azelastine HCl 0.15 % SOLN; Place 2 sprays into both nostrils 2 (two) times daily.  Dispense: 30 mL; Refill: 0 - Guaifenesin (MUCINEX MAXIMUM STRENGTH) 1200 MG TB12; Take 1 tablet (1,200 mg total) by mouth every 12 (twelve) hours as needed.  Dispense: 14 tablet; Refill: 1 - doxycycline (VIBRAMYCIN) 100 MG capsule; Take 1 capsule (100 mg total) by mouth 2 (two) times daily.  Dispense: 20 capsule; Refill: 0

## 2016-01-05 NOTE — Patient Instructions (Addendum)
  Get plenty of rest and drink at least 64 ounces of water daily. If you don't begin to improve on day 6-7 of your illness, go ahead and fill and start the antibiotic.   IF you received an x-ray today, you will receive an invoice from [facility]. Please contact [facility] at [phone] with questions or concerns regarding your invoice.   IF you received labwork today, you will receive an invoice from [employer]. Please contact [facility] at [phone] with questions or concerns regarding your invoice.   Our billing staff will not be able to assist you with questions regarding bills from these companies.  You will be contacted with the lab results as soon as they are available. The fastest way to get your results is to activate your My Chart account. Instructions are located on the last page of this paperwork. If you have not heard from [place] regarding the results in 2 weeks, please contact this office.

## 2016-01-05 NOTE — Progress Notes (Signed)
Patient ID: Gabriel Tran, male     DOB: July 15, 1955, 60 y.o.    MRN: [id number]  PCP: Elsie Stain, MD  Chief Complaint  Patient presents with  . Sinusitis  . Nasal Congestion    Subjective:   This patient is new to this practice and presents for evaluation of cough, sinus and nasal congestion.  Symptoms began 4 days ago with sinus pressure and congestion. Now the symptoms feel more like chest congestion and he has developed a cough productive of yellow sputum.  Associated symptoms include sore throat, subjective fever, diaphoresis, fatigue, anorexia and headache.  Describes "burning" in the chest when coughing, no other chest pain. No SOB. No nausea/vomiting, diarrhea.  OTC Sudafed, ibuprofen and neti pot with minimal relief.  No sick contacts, but did some leaf blowing in his yard last week.  He has a significant cardiac history, including PE, in 2013. He is followed by cardiology. He stopped Plavix 2 days ago because of his current illness, and intends to resume it once he is well.   Review of Systems Constitutional: Positive for appetite change, diaphoresis, fatigue and fever. Negative for chills.  HENT: Positive for congestion, postnasal drip, sinus pain and sinus pressure. Negative for ear discharge, ear pain, hearing loss, sore throat and tinnitus.   Eyes: Positive for discharge. Negative for pain, redness and itching.  Respiratory: Positive for cough, chest tightness and wheezing. Negative for shortness of breath.   Cardiovascular: Negative for chest pain and palpitations.  Gastrointestinal: Negative for abdominal pain, constipation, diarrhea, nausea and vomiting.  Genitourinary: Negative for dysuria, frequency, hematuria and urgency.  Musculoskeletal: Positive for myalgias.  Neurological: Positive for headaches. Negative for dizziness and light-headedness.    Prior to Admission medications   Medication Sig Start Date End Date Taking? Authorizing Provider    ALPRAZolam Duanne Moron) 0.5 MG tablet Take 1 tablet (0.5 mg total) by mouth 2 (two) times daily as needed for anxiety. 11/22/15  Yes Tonia Ghent, MD  amLODipine (NORVASC) 5 MG tablet Take 1 tablet (5 mg total) by mouth daily. 06/20/15  Yes Thayer Headings, MD  hydrochlorothiazide (HYDRODIURIL) 25 MG tablet Take 1 tablet (25 mg total) by mouth daily. as directed 11/22/15  Yes Tonia Ghent, MD  losartan (COZAAR) 100 MG tablet TAKE 1 TABLET(100 MG) BY MOUTH DAILY 11/22/15  Yes Tonia Ghent, MD  metoprolol tartrate (LOPRESSOR) 25 MG tablet TAKE 1 AND 1/2 TABLETS(37.5 MG) BY MOUTH TWICE DAILY 11/22/15  Yes Tonia Ghent, MD  Multiple Vitamin (MULTIVITAMIN WITH MINERALS) TABS tablet Take 1 tablet by mouth daily.   Yes Historical Provider, MD  potassium chloride (K-DUR) 10 MEQ tablet TAKE 1 TABLET(10 MEQ) BY MOUTH DAILY 11/22/15  Yes Tonia Ghent, MD  rosuvastatin (CRESTOR) 5 MG tablet Take 1 tablet (5 mg total) by mouth daily. 05/10/15  Yes Thayer Headings, MD  clopidogrel (PLAVIX) 75 MG tablet Take 1 tablet (75 mg total) by mouth daily. Patient not taking: Reported on 01/05/2016 04/15/15   Evans Lance, MD  cyclobenzaprine (FLEXERIL) 10 MG tablet Take 0.5-1 tablets (5-10 mg total) by mouth 3 (three) times daily as needed for muscle spasms (sedation caution). Patient not taking: Reported on 01/05/2016 11/22/15   Tonia Ghent, MD     Allergies  Allergen Reactions  . Carvedilol     "felt terrible", headache  . Lipitor [Atorvastatin]     aches     Patient Active Problem List   Diagnosis Date Noted  .  Anxiety 10/13/2014  . Actinic keratosis 10/08/2014  . CVA (cerebral vascular accident) ([place])   . Depression 01/18/2014  . Cerebral infarct ([place])   . Stroke ([place]) 01/13/2014  . Advance care planning 08/18/2013  . Pulmonary embolism ([place]) 12/04/2011  . ESRD (end stage renal disease) ([place]) 12/04/2011  . A-fib ([place]) 11/09/2011  . Aortic dissection ([place]) 10/31/2011  . RBBB 10/30/2011   . HTN (hypertension) 10/29/2011  . Routine general medical examination at a health care facility 09/07/2011  . Back pain 09/07/2011  . HLD (hyperlipidemia) 06/11/2007  . Benign hypertensive heart disease without heart failure 06/11/2007  . PSORIASIS 06/11/2007  . Hyperglycemia 06/11/2007     Family History  Problem Relation Age of Onset  . Parkinsonism Mother   . Hyperlipidemia Mother   . Leukemia Father     AML at 88  . Heart disease Father     Angioplasty in his 20s, CABG age 76.  Marland Kitchen Hypertension Father   . Hyperlipidemia Father   . Colon polyps Brother   . Kidney disease Brother   . Heart disease Brother     CABG at age 76.  Marland Kitchen Hyperlipidemia Brother   . Hypertension Brother   . Hyperlipidemia Brother   . Hypertension Brother   . Prostate cancer Neg Hx   . Colon cancer Neg Hx      Social History   Social History  . Marital status: Married    Spouse name: N/A  . Number of children: N/A  . Years of education: N/A   Occupational History  . Not on file.   Social History Main Topics  . Smoking status: Former Smoker    Packs/day: 1.00    Years: 25.00    Types: Cigarettes    Quit date: 02/05/1990  . Smokeless tobacco: Former Systems developer    Types: Snuff    Quit date: 01/05/2012     Comment: Smoked for 20 years.  . Alcohol use 3.6 oz/week    6 Cans of beer per week     Comment: 6 beers a week on average  . Drug use: No  . Sexual activity: Yes    Birth control/ protection: Surgical     Comment: Vasectomy   Other Topics Concern  . Not on file   Social History Narrative   Prev worked at [employer] with frequent long travel.    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 He appears well-developed and well-nourished. He is active and cooperative. No distress.  BP (!) 152/70 (BP Location: Right Arm, Patient Position:  Sitting, Cuff Size: Small)   Pulse 69   Temp 98.3 F (36.8 C) (Oral)   Resp 20   Ht 5\' 7"  (1.702 m)   Wt 210 lb 6.4 oz (95.4 kg)   SpO2 97%   BMI 32.95 kg/m   HENT:  Head: Normocephalic and atraumatic.  Right Ear: Hearing, tympanic membrane, external ear and ear canal normal.  Left Ear: Hearing, tympanic membrane, external ear and ear canal normal.  Nose: Mucosal edema (trace) present. Right sinus exhibits maxillary sinus tenderness (mild). Right sinus exhibits no frontal sinus tenderness. Left sinus exhibits maxillary sinus tenderness (mild). Left sinus exhibits no frontal sinus tenderness.  Mouth/Throat: Uvula is midline, oropharynx is clear and moist and mucous membranes are normal. No oropharyngeal exudate.  Eyes: Conjunctivae are normal. No scleral icterus.  Neck: Normal range of motion. Neck supple. No thyromegaly present.  Cardiovascular: Normal rate, regular rhythm and normal heart sounds.   Pulses:      Radial pulses are 2+ on the right side, and 2+ on the left side.  Pulmonary/Chest: Effort normal and breath sounds normal.  Lymphadenopathy:       Head (right side): No tonsillar, no preauricular, no posterior auricular and no occipital adenopathy present.       Head (left side): No tonsillar, no preauricular, no posterior auricular and no occipital adenopathy present.    He has no cervical adenopathy.       Right: No supraclavicular adenopathy present.       Left: No supraclavicular adenopathy present.  Neurological: He is alert and oriented to person, place, and time. No sensory deficit.  Skin: Skin is warm, dry and intact. No rash noted. No cyanosis or erythema. Nails show no clubbing.  Psychiatric: He has a normal mood and affect. His speech is normal and behavior is normal.             Assessment & Plan:  1. Viral URI with cough Anticipatory guidance and supportive care. Likely viral, but given patient's risk factors, he was provided with prescription for  doxycycline if his symptoms do not begin to improve in the next 48-72 hours. Advised he resume the Plavix today. - benzonatate (TESSALON) 100 MG capsule; Take 1-2 capsules (100-200 mg total) by mouth 3 (three) times daily as needed for cough.  Dispense: 40 capsule; Refill: 0 - Azelastine HCl 0.15 % SOLN; Place 2 sprays into both nostrils 2 (two) times daily.  Dispense: 30 mL; Refill: 0 - Guaifenesin (MUCINEX MAXIMUM STRENGTH) 1200 MG TB12; Take 1 tablet (1,200 mg total) by mouth every 12 (twelve) hours as needed.  Dispense: 14 tablet; Refill: 1 - doxycycline (VIBRAMYCIN) 100 MG capsule; Take 1 capsule (100 mg total) by mouth 2 (two) times daily.  Dispense: 20 capsule; Refill: 0   Fara Chute, PA-C Physician Assistant-Certified [facility]

## 2016-01-10 ENCOUNTER — Ambulatory Visit (INDEPENDENT_AMBULATORY_CARE_PROVIDER_SITE_OTHER): Payer: BC Managed Care – PPO | Admitting: *Deleted

## 2016-01-10 DIAGNOSIS — I639 Cerebral infarction, unspecified: Secondary | ICD-10-CM | POA: Diagnosis not present

## 2016-01-11 NOTE — Progress Notes (Signed)
Carelink Summary Report / Loop Recorder 

## 2016-01-26 LAB — CUP PACEART REMOTE DEVICE CHECK
Date Time Interrogation Session: 20171106040847
MDC IDC PG IMPLANT DT: 20151216

## 2016-01-26 NOTE — Progress Notes (Signed)
Carelink summary report received. Battery status OK. Normal device function. No new symptom episodes, tachy episodes, brady, or pause episodes. No new AF episodes. Monthly summary reports and ROV/PRN 

## 2016-02-09 ENCOUNTER — Ambulatory Visit (INDEPENDENT_AMBULATORY_CARE_PROVIDER_SITE_OTHER): Payer: BC Managed Care – PPO | Admitting: *Deleted

## 2016-02-09 DIAGNOSIS — I639 Cerebral infarction, unspecified: Secondary | ICD-10-CM | POA: Diagnosis not present

## 2016-02-10 NOTE — Progress Notes (Signed)
Carelink Summary Report / Loop Recorder 

## 2016-02-27 LAB — CUP PACEART REMOTE DEVICE CHECK
Implantable Pulse Generator Implant Date: 20151216
MDC IDC SESS DTM: 20171206050909

## 2016-02-27 NOTE — Progress Notes (Signed)
Carelink summary report received. Battery status OK. Normal device function. No new symptom episodes, tachy episodes, brady, or pause episodes. No new AF episodes. Monthly summary reports and ROV/PRN 

## 2016-03-12 ENCOUNTER — Ambulatory Visit (INDEPENDENT_AMBULATORY_CARE_PROVIDER_SITE_OTHER): Payer: BC Managed Care – PPO | Admitting: *Deleted

## 2016-03-12 DIAGNOSIS — I639 Cerebral infarction, unspecified: Secondary | ICD-10-CM

## 2016-03-13 NOTE — Progress Notes (Signed)
Carelink Summary Report / Loop Recorder 

## 2016-03-16 ENCOUNTER — Other Ambulatory Visit: Payer: Self-pay | Admitting: Family Medicine

## 2016-03-16 ENCOUNTER — Other Ambulatory Visit: Payer: Self-pay | Admitting: Internal Medicine

## 2016-03-24 LAB — CUP PACEART REMOTE DEVICE CHECK
Date Time Interrogation Session: 20180105054041
MDC IDC PG IMPLANT DT: 20151216

## 2016-03-24 NOTE — Progress Notes (Signed)
Carelink summary report received. Battery status OK. Normal device function. No new symptom episodes, tachy episodes, brady, or pause episodes. No new AF episodes. Monthly summary reports and ROV/PRN 

## 2016-04-07 LAB — CUP PACEART REMOTE DEVICE CHECK
Date Time Interrogation Session: 20180204054446
Implantable Pulse Generator Implant Date: 20151216

## 2016-04-07 NOTE — Progress Notes (Signed)
Carelink summary report received. Battery status OK. Normal device function. No new symptom episodes, tachy episodes, brady, or pause episodes. No new AF episodes. Monthly summary reports and ROV/PRN 

## 2016-04-10 ENCOUNTER — Ambulatory Visit (INDEPENDENT_AMBULATORY_CARE_PROVIDER_SITE_OTHER): Payer: BC Managed Care – PPO | Admitting: *Deleted

## 2016-04-10 DIAGNOSIS — I639 Cerebral infarction, unspecified: Secondary | ICD-10-CM

## 2016-04-10 NOTE — Progress Notes (Signed)
Carelink Summary Report / Loop Recorder 

## 2016-05-03 ENCOUNTER — Telehealth: Payer: Self-pay | Admitting: Internal Medicine

## 2016-05-07 ENCOUNTER — Other Ambulatory Visit: Payer: Self-pay | Admitting: *Deleted

## 2016-05-07 MED ORDER — CLOPIDOGREL BISULFATE 75 MG PO TABS: 75.0000 mg | ORAL_TABLET | Freq: Every day | ORAL | 0 refills | Status: DC

## 2016-05-09 NOTE — Telephone Encounter (Signed)
New Message     Pt has appt w Tommye Standard for medication management on 05/18/16    *STAT* If patient is at the pharmacy, call can be transferred to refill team.   1. Which medications need to be refilled? (please list name of each medication and dose if known) plavix 75 mg  2. Which pharmacy/location (including street and city if local pharmacy) is medication to be sent to? [facility] on [address]   3. Do they need a 30 day or 90 day supply? [place]

## 2016-05-10 ENCOUNTER — Ambulatory Visit (INDEPENDENT_AMBULATORY_CARE_PROVIDER_SITE_OTHER): Payer: BC Managed Care – PPO | Admitting: *Deleted

## 2016-05-10 DIAGNOSIS — I639 Cerebral infarction, unspecified: Secondary | ICD-10-CM

## 2016-05-10 NOTE — Progress Notes (Signed)
Carelink Summary Report / Loop Recorder 

## 2016-05-12 ENCOUNTER — Other Ambulatory Visit: Payer: Self-pay | Admitting: Cardiovascular Disease

## 2016-05-12 LAB — CUP PACEART REMOTE DEVICE CHECK
Implantable Pulse Generator Implant Date: 20151216
MDC IDC SESS DTM: 20180306064017

## 2016-05-17 NOTE — Progress Notes (Signed)
Cardiology Office Note Date:  05/18/2016  Patient ID:  Gabriel Tran, Gabriel Tran 11/29/1955, MRN [id number] PCP:  Elsie Stain, MD  Cardiologist:  Dr. Acie Fredrickson Electrophysiologist: Dr. Lovena Le   Chief Complaint: annual visit, medicatioin refill  History of Present Illness: Gabriel Tran is a 61 y.o. male with history of HTN, ascending aortic dissection in the setting of HTN ooc, underwent emergent repair 2013 and his postop course was complicated by PEA arrest and pulmonary emboli, ARF requiring temp HD, he hd a CVA w/BP 200/115 treated w/BP control and TPA 2015, though w/u showed evidence of embolic stroke and underwent ILR.  He comes into the office today to be seen for Dr. Lovena Le, last seen by him in Feb 2017, at that time doing OK but having significant anxiety about resideual descending dissection and reported he was to return to see CTS (Dr. Lawson Fiscal) for further evaluation and recommendations.  He saw Dr. Acie Fredrickson in April last year his BP medicines adjusted for better control as well as started on statin.  He saw Dr. Lawson Fiscal in August planned for annual surveillance CTs  He reports today feeling well, no CP/back pain, palpitations or SOB, no dizziness, near syncope or syncope.  States he has been doing well and comes only because he needed refills on his medicines   Past Medical History:  Diagnosis Date  . Aortic dissection ([place])   . Cardiac tamponade 2013  . Colon polyps 2017  . CVA (cerebral infarction)   . Hyperglycemia    a. A1C 5.9 in Sept 2013.  Marland Kitchen Hypertension   . PE (pulmonary embolism)   . RBBB   . Renal insufficiency 08/28/2012  . Sleep apnea   . Stroke [facility])     Past Surgical History:  Procedure Laterality Date  . CARPAL TUNNEL RELEASE     2004 RIGHT  . COLONOSCOPY    . INSERTION OF DIALYSIS CATHETER  11/06/2011   Procedure: INSERTION OF DIALYSIS CATHETER;  Surgeon: Ivin Poot, MD;  Location: [place];  Service: Thoracic;  Laterality: Right;  . INSERTION OF  DIALYSIS CATHETER  11/13/2011   Procedure: INSERTION OF DIALYSIS CATHETER;  Surgeon: Elam Dutch, MD;  Location: [place];  Service: Vascular;  Laterality: N/A;  Ultrasound guided.  Marland Kitchen KNEE SURGERY     LEFT 1981  . LEFT HEART CATHETERIZATION WITH CORONARY ANGIOGRAM N/A 10/30/2011   Procedure: LEFT HEART CATHETERIZATION WITH CORONARY ANGIOGRAM;  Surgeon: Burnell Blanks, MD;  Location: [facility] CATH LAB;  Service: Cardiovascular;  Laterality: N/A;  . LOOP RECORDER IMPLANT N/A 01/20/2014   MDT LINQ implanted by Dr Lovena Le for cryptogenic stroke  . MEDIASTINAL EXPLORATION  11/06/2011   Procedure: MEDIASTINAL EXPLORATION;  Surgeon: Ivin Poot, MD;  Location: [place];  Service: Thoracic;  Laterality: N/A;  sternal closure; removal of wound vac  . POLYPECTOMY    . TEE WITHOUT CARDIOVERSION N/A 01/20/2014   Procedure: TRANSESOPHAGEAL ECHOCARDIOGRAM (TEE);  Surgeon: Candee Furbish, MD;  Location: [place];  Service: Cardiovascular;  Laterality: N/A;  . THORACIC AORTIC ANEURYSM REPAIR  10/30/2011   Procedure: THORACIC ASCENDING ANEURYSM REPAIR (AAA);  Surgeon: Ivin Poot, MD;  Location: [place];  Service: Open Heart Surgery;  Laterality: N/A;  Ascending aortic dissection repair, arch reconstruction, aortic valve repair  . WISDOM TOOTH EXTRACTION      Current Outpatient Prescriptions  Medication Sig Dispense Refill  . ALPRAZolam (XANAX) 0.5 MG tablet Take 1 tablet (0.5 mg total) by mouth 2 (two) times daily  as needed for anxiety. 30 tablet 1  . amLODipine (NORVASC) 5 MG tablet Take 1 tablet (5 mg total) by mouth daily. 90 tablet 3  . Azelastine HCl 0.15 % SOLN Place 2 sprays into both nostrils 2 (two) times daily. 30 mL 0  . benzonatate (TESSALON) 100 MG capsule Take 1-2 capsules (100-200 mg total) by mouth 3 (three) times daily as needed for cough. 40 capsule 0  . clopidogrel (PLAVIX) 75 MG tablet Take 1 tablet (75 mg total) by mouth daily. 90 tablet 1  . cyclobenzaprine (FLEXERIL) 10 MG tablet  Take 0.5-1 tablets (5-10 mg total) by mouth 3 (three) times daily as needed for muscle spasms (sedation caution). 30 tablet 0  . Guaifenesin (MUCINEX MAXIMUM STRENGTH) 1200 MG TB12 Take 1 tablet (1,200 mg total) by mouth every 12 (twelve) hours as needed. 14 tablet 1  . hydrochlorothiazide (HYDRODIURIL) 25 MG tablet Take 1 tablet (25 mg total) by mouth daily. as directed 90 tablet 3  . losartan (COZAAR) 100 MG tablet TAKE 1 TABLET(100 MG) BY MOUTH DAILY 90 tablet 3  . metoprolol tartrate (LOPRESSOR) 25 MG tablet TAKE 1 AND 1/2 TABLETS(37.5 MG) BY MOUTH TWICE DAILY 270 tablet 3  . Multiple Vitamin (MULTIVITAMIN WITH MINERALS) TABS tablet Take 1 tablet by mouth daily.    . potassium chloride (K-DUR) 10 MEQ tablet TAKE 1 TABLET(10 MEQ) BY MOUTH DAILY 90 tablet 3  . rosuvastatin (CRESTOR) 5 MG tablet TAKE 1 TABLET(5 MG) BY MOUTH DAILY 30 tablet 0   No current facility-administered medications for this visit.     Allergies:   Carvedilol and Lipitor [atorvastatin]   Social History:  The patient  reports that he quit smoking about 26 years ago. His smoking use included Cigarettes. He has a 25.00 pack-year smoking history. He quit smokeless tobacco use about 4 years ago. His smokeless tobacco use included Snuff. He reports that he drinks about 3.6 oz of alcohol per week . He reports that he does not use drugs.   Family History:  The patient's family history includes Colon polyps in his brother; Heart disease in his brother and father; Hyperlipidemia in his brother, brother, father, and mother; Hypertension in his brother, brother, and father; Kidney disease in his brother; Leukemia in his father; Parkinsonism in his mother.  ROS:  Please see the history of present illness.  All other systems are reviewed and otherwise negative.   PHYSICAL EXAM:  VS:  BP (!) 154/63   Ht 5\' 7"  (1.702 m)   Wt 213 lb (96.6 kg)   BMI 33.36 kg/m  BMI: Body mass index is 33.36 kg/m. Well nourished, well developed, in no  acute distress  HEENT: normocephalic, atraumatic  Neck: no JVD, carotid bruits or masses Cardiac: RRR; no significant murmurs, no rubs, or gallops Lungs:  CTA b/l, no wheezing, rhonchi or rales  Abd: soft, nontender MS: no deformity or atrophy Ext: no edema  Skin: warm and dry, no rash Neuro:  No gross deficits appreciated Psych: euthymic mood, full affect   ILR site is stable, no tethering or discomfort   EKG:  Recommended EKG today given >1 year, patient declined, is SR on ILR today ILR interrogation done today with industry and reviewed by myself: No device observations/arrhythmias, R wave is 0.48V, SR 70's today, battery is good  01/20/14: TEE Study Conclusions - Left ventricle: Systolic function was normal. The estimated ejection fraction was in the range of 55% to 60%. - Aortic valve: No evidence of vegetation. There  was moderate regurgitation. - Aorta: Descending aortic dissection noted (true and false lumen). - Mitral valve: No evidence of vegetation. - Left atrium: No evidence of thrombus in the atrial cavity or appendage. - Right atrium: No evidence of thrombus in the atrial cavity or appendage. - Atrial septum: No defect or patent foramen ovale was identified. Echo contrast study showed no right-to-left atrial level shunt, following an increase in RA pressure induced by provocative maneuvers. - Tricuspid valve: No evidence of vegetation. - Pulmonic valve: No evidence of vegetation. - Superior vena cava: The study excluded a thrombus. Impressions: - No cardiac source of emboli was indentified.  Recent Labs: 08/15/2015: ALT 20 11/17/2015: BUN 15; Creatinine, Ser 1.22; Potassium 4.4; Sodium 130  08/15/2015: Cholesterol 146; HDL 68; LDL Cholesterol 64; Total CHOL/HDL Ratio 2.1; Triglycerides 68; VLDL 14   CrCl cannot be calculated (Patient's most recent lab result is older than the maximum 21 days allowed.).   Wt Readings from Last 3 Encounters:    05/18/16 213 lb (96.6 kg)  01/05/16 210 lb 6.4 oz (95.4 kg)  11/22/15 210 lb 4 oz (95.4 kg)     Other studies reviewed: Additional studies/records reviewed today include: summarized above  ASSESSMENT AND PLAN:   1. Cryptogenic stroke w/ILR     no findings of AF as of yet  2. HTN     Not perfect today especially given his history  3.Hx of AO dissection repair    Type A ascending with residual Type B descending    Following with CTS    03/31/15 MRA chest stable from 2013, saw Dr. Lawson Fiscal Aug 2017 with non-contrast study recommended for annual CTs      We discussed adjustment of his medicine, suggest up-titration of his Norvasc though he is hesitant with reports of swelling with it, he has taken catapress but makes him feel like a zombie, we could increase his BB.  The patient is a little reluctant, for the most part he reports his BP is better, a recheck by myself was 138/80.  Disussed minimizing sodium intake he has noted his BP increases with salt loads   For now, he is asked to monitor his BP daily at home, keeping a log and will arrange RPH/BP visit in 1 month, he would like to get back with Dr. Acie Fredrickson and will have him see him in a couple months, he can see EP service as needed with his monthly remote ILR transmissions   Current medicines are reviewed at length with the patient today.  The patient did not have any concerns regarding medicines  Signed, Gabriel Books, PA-C 05/18/2016 9:40 AM     [facility] [address] [place] [address] [phone] (office)  [phone] (fax)

## 2016-05-18 ENCOUNTER — Ambulatory Visit: Payer: BC Managed Care – PPO | Admitting: Physician Assistant

## 2016-05-18 VITALS — BP 154/63 | Ht 67.0 in | Wt 213.0 lb

## 2016-05-18 DIAGNOSIS — Z4509 Encounter for adjustment and management of other cardiac device: Secondary | ICD-10-CM | POA: Diagnosis not present

## 2016-05-18 DIAGNOSIS — I10 Essential (primary) hypertension: Secondary | ICD-10-CM

## 2016-05-18 MED ORDER — CLOPIDOGREL BISULFATE 75 MG PO TABS: 75.0000 mg | ORAL_TABLET | Freq: Every day | ORAL | 1 refills | Status: DC

## 2016-05-18 NOTE — Patient Instructions (Signed)
Medication Instructions:   Your physician recommends that you continue on your current medications as directed. Please refer to the Current Medication list given to you today.   If you need a refill on your cardiac medications before your next appointment, please call your pharmacy.  Labwork:  NONE ORDERED  TODAY    Testing/Procedures: NONE ORDERED  TODAY    Follow-Up:  IN ONE MONTH  WITH PHARM D FOR BLOOD PRESSURE CHECK  WITH GREG TAYLOR AS NEEDED  WITH DR Acie Fredrickson IN 2 TO 3 MONTHS    Any Other Special Instructions Will Be Listed Below (If Applicable).

## 2016-05-19 LAB — CUP PACEART REMOTE DEVICE CHECK
Date Time Interrogation Session: 20180405063756
Implantable Pulse Generator Implant Date: 20151216

## 2016-05-19 NOTE — Progress Notes (Signed)
Carelink summary report received. Battery status OK. Normal device function. No new symptom episodes, tachy episodes, brady, or pause episodes. No new AF episodes. Monthly summary reports and ROV/PRN 

## 2016-06-11 ENCOUNTER — Ambulatory Visit (INDEPENDENT_AMBULATORY_CARE_PROVIDER_SITE_OTHER): Payer: BC Managed Care – PPO | Admitting: *Deleted

## 2016-06-11 DIAGNOSIS — I639 Cerebral infarction, unspecified: Secondary | ICD-10-CM

## 2016-06-12 NOTE — Progress Notes (Signed)
Carelink Summary Report / Loop Recorder 

## 2016-06-18 ENCOUNTER — Ambulatory Visit (INDEPENDENT_AMBULATORY_CARE_PROVIDER_SITE_OTHER): Payer: BC Managed Care – PPO | Admitting: Pharmacist

## 2016-06-18 VITALS — BP 142/76 | HR 56

## 2016-06-18 DIAGNOSIS — I10 Essential (primary) hypertension: Secondary | ICD-10-CM

## 2016-06-18 MED ORDER — VALSARTAN 320 MG PO TABS: 320.0000 mg | ORAL_TABLET | Freq: Every day | ORAL | 1 refills | Status: DC

## 2016-06-18 MED ORDER — CHLORTHALIDONE 25 MG PO TABS: 25.0000 mg | ORAL_TABLET | Freq: Every day | ORAL | 1 refills | Status: DC

## 2016-06-18 MED ORDER — CHLORTHALIDONE 25 MG PO TABS
25.0000 mg | ORAL_TABLET | Freq: Every day | ORAL | 1 refills | Status: DC
Start: 2016-06-18 — End: 2016-07-24

## 2016-06-18 NOTE — Progress Notes (Signed)
Patient ID: Gabriel Tran                 DOB: [birth year]                      MRN: [id number]     HPI: Gabriel Tran is a 61 y.o. male patient of Dr. Acie Fredrickson who presents today for hypertension evaluation.  PMH including HTN, ascending aortic dissection in the setting of HTN ooc, underwent emergent repair 2013 and his postop course was complicated by PEA arrest and pulmonary emboli, ARF requiring temp HD, he hd a CVA w/BP 200/115 treated w/BP control and TPA 2015, though w/u showed evidence of embolic stroke and underwent ILR. He recently saw Tommye Standard, PA and his pressure was borderline controlled given history it was advised he sodium restrict as he was hesitant to change medication regimen. He was told to keep log and follow up in HTN clinic.   He presents today stating that his pressure has been high since his last visit with the clinic. He states his pressure measures dramatically differently depending on how he takes it. He states it varied between arms and if he is sitting or standing.   Current HTN meds:  Amlodipine 5mg  daily in the evening Hydrochlorothiazide 25mg  daily in the morning Losartan 100mg  daily in the evening Metoprolol tartrate 25mg  BID  Previously tried: higher doses of amlodipine - swelling, carvedilol - felt terrible, clonidine - felt like a zombie  BP goal: <130/80  Social History:  The patient  reports that he quit smoking about 26 years ago. His smoking use included Cigarettes. He has a 25.00 pack-year smoking history. He quit smokeless tobacco use about 4 years ago. His smokeless tobacco use included Snuff. He reports that he drinks about 3.6 oz of alcohol per week . He reports that he does not use drugs.   Family History:  The patient's family history includes Colon polyps in his brother; Heart disease in his brother and father; Hyperlipidemia in his brother, brother, father, and mother; Hypertension in his brother, brother, and father; Kidney disease in  his brother; Leukemia in his father; Parkinsonism in his mother.  Diet: Eats most meals from home. Tries to go light on salt and seasonings. Drink unsweet iced tea. Drinks about 1 gallon in 2 days.   Exercise: No formal exercise. 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   Home BP readings: 162/71 in L and 1 min later in right 149/77. 133/74 and a few minutes later 163/78 in the same arm.   Wt Readings from Last 3 Encounters:  05/18/16 213 lb (96.6 kg)  01/05/16 210 lb 6.4 oz (95.4 kg)  11/22/15 210 lb 4 oz (95.4 kg)   BP Readings from Last 3 Encounters:  06/18/16 (!) 142/76  05/18/16 (!) 154/63  01/05/16 (!) 152/70   Pulse Readings from Last 3 Encounters:  06/18/16 (!) 56  01/05/16 69  11/22/15 64    Renal function: CrCl cannot be calculated (Patient's most recent lab result is older than the maximum 21 days allowed.).  Past Medical History:  Diagnosis Date  . Aortic dissection ([place])   . Cardiac tamponade 2013  . Colon polyps 2017  . CVA (cerebral infarction)   . Hyperglycemia    a. A1C 5.9 in Sept 2013.  Marland Kitchen Hypertension   . PE (pulmonary embolism)   . RBBB   . Renal insufficiency 08/28/2012  . Sleep apnea   .  Stroke [facility])     Current Outpatient Prescriptions on File Prior to Visit  Medication Sig Dispense Refill  . ALPRAZolam (XANAX) 0.5 MG tablet Take 1 tablet (0.5 mg total) by mouth 2 (two) times daily as needed for anxiety. 30 tablet 1  . amLODipine (NORVASC) 5 MG tablet Take 1 tablet (5 mg total) by mouth daily. 90 tablet 3  . clopidogrel (PLAVIX) 75 MG tablet Take 1 tablet (75 mg total) by mouth daily. 90 tablet 1  . cyclobenzaprine (FLEXERIL) 10 MG tablet Take 0.5-1 tablets (5-10 mg total) by mouth 3 (three) times daily as needed for muscle spasms (sedation caution). 30 tablet 0  . metoprolol tartrate (LOPRESSOR) 25 MG tablet TAKE 1 AND 1/2 TABLETS(37.5 MG) BY MOUTH TWICE DAILY (Patient taking differently: Take 25 mg by mouth 2 (two) times daily.  TAKE 1 AND 1/2 TABLETS(37.5 MG) BY MOUTH TWICE DAILY) 270 tablet 3  . Multiple Vitamin (MULTIVITAMIN WITH MINERALS) TABS tablet Take 1 tablet by mouth 2 (two) times daily.     . potassium chloride (K-DUR) 10 MEQ tablet TAKE 1 TABLET(10 MEQ) BY MOUTH DAILY 90 tablet 3  . rosuvastatin (CRESTOR) 5 MG tablet TAKE 1 TABLET(5 MG) BY MOUTH DAILY 30 tablet 0   No current facility-administered medications on file prior to visit.     Allergies  Allergen Reactions  . Carvedilol     "felt terrible", headache  . Lipitor [Atorvastatin]     aches    Blood pressure (!) 142/76, pulse (!) 56.   Assessment/Plan: Hypertension: BP is above goal <130/80. Cuff measures about 58mmHg higher for systolic and appropriately for diastolic upon 2 checks. His pressure today is essentially the same in each arm. Instructions given for monitoring at home. Will change his losartan to valsartan 320mg  daily and change HCTZ to chlorthalidone 25mg  daily since these agents tend to be more potent on pressures. If pressure remains elevated will increase chlorthalidone vs. Start another agent. Follow up in HTN clinic in 4 weeks.    Thank you, Lelan Pons. Patterson Hammersmith, [facility]  06/18/2016 2:41 PM

## 2016-06-18 NOTE — Patient Instructions (Addendum)
Return for a follow up appointment in 4 weeks.  Check your blood pressure at home daily (if able) and keep record of the readings.  Take your BP meds as follows: STOP losartan and Hydrochlorothiazide  START valsartan 320mg  daily START chlorthalidone 25mg  daily   Bring all of your meds, your BP cuff and your record of home blood pressures to your next appointment.  Exercise as you're able, try to walk approximately 30 minutes per day.  Keep salt intake to a minimum, especially watch canned and prepared boxed foods.  Eat more fresh fruits and vegetables and fewer canned items.  Avoid eating in fast food restaurants.    HOW TO TAKE YOUR BLOOD PRESSURE: . Rest 5 minutes before taking your blood pressure. .  Don't smoke or drink caffeinated beverages for at least 30 minutes before. . Take your blood pressure before (not after) you eat. . Sit comfortably with your back supported and both feet on the floor (don't cross your legs). . Elevate your arm to heart level on a table or a desk. . Use the proper sized cuff. It should fit smoothly and snugly around your bare upper arm. There should be enough room to slip a fingertip under the cuff. The bottom edge of the cuff should be 1 inch above the crease of the elbow. . Ideally, take 3 measurements at one sitting and record the average.

## 2016-06-19 ENCOUNTER — Other Ambulatory Visit: Payer: Self-pay | Admitting: Cardiovascular Disease

## 2016-06-19 ENCOUNTER — Other Ambulatory Visit: Payer: Self-pay | Admitting: Internal Medicine

## 2016-06-25 LAB — CUP PACEART REMOTE DEVICE CHECK
Date Time Interrogation Session: 20180505074216
MDC IDC PG IMPLANT DT: 20151216

## 2016-07-09 ENCOUNTER — Ambulatory Visit (INDEPENDENT_AMBULATORY_CARE_PROVIDER_SITE_OTHER): Payer: BC Managed Care – PPO | Admitting: *Deleted

## 2016-07-09 DIAGNOSIS — I639 Cerebral infarction, unspecified: Secondary | ICD-10-CM

## 2016-07-09 NOTE — Progress Notes (Signed)
Carelink Summary Report / Loop Recorder 

## 2016-07-20 LAB — CUP PACEART REMOTE DEVICE CHECK
Date Time Interrogation Session: 20180604074339
MDC IDC PG IMPLANT DT: 20151216

## 2016-07-24 ENCOUNTER — Ambulatory Visit (INDEPENDENT_AMBULATORY_CARE_PROVIDER_SITE_OTHER): Payer: BC Managed Care – PPO | Admitting: Pharmacist

## 2016-07-24 ENCOUNTER — Other Ambulatory Visit: Payer: BC Managed Care – PPO | Admitting: *Deleted

## 2016-07-24 VITALS — BP 130/74 | HR 60

## 2016-07-24 DIAGNOSIS — I10 Essential (primary) hypertension: Secondary | ICD-10-CM | POA: Diagnosis not present

## 2016-07-24 LAB — BASIC METABOLIC PANEL
BUN/Creatinine Ratio: 12 (ref 9–20)
BUN: 14 mg/dL (ref 6–24)
CALCIUM: 9.9 mg/dL (ref 8.7–10.2)
CO2: 21 mmol/L (ref 20–29)
CREATININE: 1.13 mg/dL (ref 0.76–1.27)
Chloride: 92 mmol/L — ABNORMAL LOW (ref 96–106)
GFR calc Af Amer: 82 mL/min/{1.73_m2} (ref >59)
GFR, EST NON AFRICAN AMERICAN: 71 mL/min/{1.73_m2} (ref >59)
Glucose: 109 mg/dL — ABNORMAL HIGH (ref 65–99)
POTASSIUM: 3.9 mmol/L (ref 3.5–5.2)
Sodium: 130 mmol/L — ABNORMAL LOW (ref 134–144)

## 2016-07-24 MED ORDER — CHLORTHALIDONE 25 MG PO TABS: 25.0000 mg | ORAL_TABLET | Freq: Every day | ORAL | 1 refills | Status: DC

## 2016-07-24 MED ORDER — VALSARTAN 320 MG PO TABS
320.0000 mg | ORAL_TABLET | Freq: Every day | ORAL | 1 refills | Status: DC
Start: 2016-07-24 — End: 2017-02-07

## 2016-07-24 NOTE — Patient Instructions (Signed)
Return for a follow up appointment as scheduled with Dr. Acie Fredrickson  Check your blood pressure at home daily (if able) and keep record of the readings.  Take your BP meds as follows: CONTINUE as prescribed.   Bring all of your meds, your BP cuff and your record of home blood pressures to your next appointment.  Exercise as you're able, try to walk approximately 30 minutes per day.  Keep salt intake to a minimum, especially watch canned and prepared boxed foods.  Eat more fresh fruits and vegetables and fewer canned items.  Avoid eating in fast food restaurants.    HOW TO TAKE YOUR BLOOD PRESSURE: . Rest 5 minutes before taking your blood pressure. .  Don't smoke or drink caffeinated beverages for at least 30 minutes before. . Take your blood pressure before (not after) you eat. . Sit comfortably with your back supported and both feet on the floor (don't cross your legs). . Elevate your arm to heart level on a table or a desk. . Use the proper sized cuff. It should fit smoothly and snugly around your bare upper arm. There should be enough room to slip a fingertip under the cuff. The bottom edge of the cuff should be 1 inch above the crease of the elbow. . Ideally, take 3 measurements at one sitting and record the average.

## 2016-07-24 NOTE — Progress Notes (Signed)
Patient ID: Gabriel Tran                 DOB: 1955-02-20                      MRN: [id number]     HPI: Gabriel Tran is a 61 y.o. male patient of Dr. Acie Fredrickson who presents today for hypertension follow up.  PMH including HTN, ascending aortic dissection in the setting of HTN ooc, underwent emergent repair 2013 and his postop course was complicated by PEA arrest and pulmonary emboli, ARF requiring temp HD, he hd a CVA w/BP 200/115 treated w/BP control and TPA 2015, though w/u showed evidence of embolic stroke and underwent ILR. He recently saw Tommye Standard, PA and his pressure was borderline controlled given history it was advised he sodium restrict as he was hesitant to change medication regimen. At his most recent visit with HTN clinic, his losartan was changed to valsartan 320mg  and HCTZ was changed to chlorthalidone.   He presents today for follow up stating that he is doing well. He denies dizziness, SOB, headache, chest pain. He report he has been using ibuprofen occasionally for pain associated with a recent dental procedure. Otherwise he is doing well on current regimen.   Current HTN meds:  Amlodipine 5mg  daily in the evening Chlorthalidone 25mg  daily Valsartan 320mg  daily in the evening Metoprolol tartrate 25mg  BID  Previously tried: higher doses of amlodipine - swelling, carvedilol - felt terrible, clonidine - felt like a zombie  BP goal: <130/80  Social History:  The patient  reports that he quit smoking about 26 years ago. His smoking use included Cigarettes. He has a 25.00 pack-year smoking history. He quit smokeless tobacco use about 4 years ago. His smokeless tobacco use included Snuff. He reports that he drinks about 3.6 oz of alcohol per week . He reports that he does not use drugs.   Family History:  Colon polyps in his brother; Heart disease in his brother and father; Hyperlipidemia in his brother, brother, father, and mother; Hypertension in his brother, brother, and  father; Kidney disease in his brother; Leukemia in his father; Parkinsonism in his mother.  Diet: Eats most meals from home. Tries to go light on salt and seasonings. Drink unsweet iced tea. Drinks about 1 gallon in 2 days.   Exercise: No formal exercise. 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   Home BP readings: 135/75 pt report.   Wt Readings from Last 3 Encounters:  05/18/16 213 lb (96.6 kg)  01/05/16 210 lb 6.4 oz (95.4 kg)  11/22/15 210 lb 4 oz (95.4 kg)   BP Readings from Last 3 Encounters:  07/24/16 130/74  06/18/16 (!) 142/76  05/18/16 (!) 154/63   Pulse Readings from Last 3 Encounters:  07/24/16 60  06/18/16 (!) 56  01/05/16 69    Renal function: CrCl cannot be calculated (Patient's most recent lab result is older than the maximum 21 days allowed.).  Past Medical History:  Diagnosis Date  . Aortic dissection ([place])   . Cardiac tamponade 2013  . Colon polyps 2017  . CVA (cerebral infarction)   . Hyperglycemia    a. A1C 5.9 in Sept 2013.  Marland Kitchen Hypertension   . PE (pulmonary embolism)   . RBBB   . Renal insufficiency 08/28/2012  . Sleep apnea   . Stroke [facility])     Current Outpatient Prescriptions on File Prior to Visit  Medication Sig  Dispense Refill  . ALPRAZolam (XANAX) 0.5 MG tablet Take 1 tablet (0.5 mg total) by mouth 2 (two) times daily as needed for anxiety. 30 tablet 1  . amLODipine (NORVASC) 5 MG tablet TAKE 1 TABLET(5 MG) BY MOUTH DAILY 90 tablet 0  . calcium carbonate (OS-CAL - DOSED IN MG OF ELEMENTAL CALCIUM) 1250 (500 Ca) MG tablet Take 1 tablet by mouth daily with breakfast.    . Cholecalciferol (VITAMIN D3) 5000 units CAPS Take 5,000 Units by mouth daily.    . clopidogrel (PLAVIX) 75 MG tablet Take 1 tablet (75 mg total) by mouth daily. 90 tablet 1  . cyclobenzaprine (FLEXERIL) 10 MG tablet Take 0.5-1 tablets (5-10 mg total) by mouth 3 (three) times daily as needed for muscle spasms (sedation caution). 30 tablet 0  . Menaquinone-7  (VITAMIN K2) 100 MCG CAPS Take 100 mcg by mouth daily.    . metoprolol tartrate (LOPRESSOR) 25 MG tablet TAKE 1 AND 1/2 TABLETS(37.5 MG) BY MOUTH TWICE DAILY (Patient taking differently: Take 25 mg by mouth 2 (two) times daily. TAKE 1 AND 1/2 TABLETS(37.5 MG) BY MOUTH TWICE DAILY) 270 tablet 3  . Multiple Vitamin (MULTIVITAMIN WITH MINERALS) TABS tablet Take 1 tablet by mouth 2 (two) times daily.     . potassium chloride (K-DUR) 10 MEQ tablet TAKE 1 TABLET(10 MEQ) BY MOUTH DAILY 90 tablet 3  . rosuvastatin (CRESTOR) 5 MG tablet Take 1 tablet (5 mg total) by mouth daily at 6 PM. 30 tablet 10   No current facility-administered medications on file prior to visit.     Allergies  Allergen Reactions  . Carvedilol     "felt terrible", headache  . Lipitor [Atorvastatin]     aches    Blood pressure 130/74, pulse 60.   Assessment/Plan: Hypertension:  BP is at goal today. Continue current regimen and follow up with Dr. Acie Fredrickson as scheduled and HTN clinic as needed.    Thank you, Lelan Pons. Patterson Hammersmith, [facility]  07/24/2016 10:00 AM

## 2016-08-06 ENCOUNTER — Encounter: Payer: BC Managed Care – PPO | Admitting: Internal Medicine

## 2016-08-09 ENCOUNTER — Ambulatory Visit (INDEPENDENT_AMBULATORY_CARE_PROVIDER_SITE_OTHER): Payer: BC Managed Care – PPO | Admitting: *Deleted

## 2016-08-09 DIAGNOSIS — I639 Cerebral infarction, unspecified: Secondary | ICD-10-CM

## 2016-08-10 NOTE — Progress Notes (Signed)
Carelink Summary Report / Loop Recorder 

## 2016-08-13 LAB — CUP PACEART REMOTE DEVICE CHECK
Implantable Pulse Generator Implant Date: 20151216
MDC IDC SESS DTM: 20180704141143

## 2016-08-21 ENCOUNTER — Encounter: Payer: Self-pay | Admitting: Cardiovascular Disease

## 2016-08-21 ENCOUNTER — Ambulatory Visit (INDEPENDENT_AMBULATORY_CARE_PROVIDER_SITE_OTHER): Payer: BC Managed Care – PPO | Admitting: Cardiovascular Disease

## 2016-08-21 ENCOUNTER — Other Ambulatory Visit: Payer: Self-pay | Admitting: Cardiothoracic Surgery

## 2016-08-21 VITALS — BP 150/76 | HR 58 | Ht 67.0 in | Wt 212.0 lb

## 2016-08-21 DIAGNOSIS — E782 Mixed hyperlipidemia: Secondary | ICD-10-CM

## 2016-08-21 DIAGNOSIS — I71019 Dissection of thoracic aorta, unspecified: Secondary | ICD-10-CM

## 2016-08-21 DIAGNOSIS — I7101 Dissection of thoracic aorta: Secondary | ICD-10-CM | POA: Diagnosis not present

## 2016-08-21 DIAGNOSIS — I10 Essential (primary) hypertension: Secondary | ICD-10-CM | POA: Diagnosis not present

## 2016-08-21 LAB — LIPID PANEL
CHOLESTEROL TOTAL: 178 mg/dL (ref 100–199)
Chol/HDL Ratio: 2.6 ratio (ref 0.0–5.0)
HDL: 68 mg/dL (ref >39)
LDL CALC: 97 mg/dL (ref 0–99)
Triglycerides: 64 mg/dL (ref 0–149)
VLDL CHOLESTEROL CAL: 13 mg/dL (ref 5–40)

## 2016-08-21 LAB — COMPREHENSIVE METABOLIC PANEL
ALBUMIN: 4.3 g/dL (ref 3.5–5.5)
ALK PHOS: 55 IU/L (ref 39–117)
ALT: 26 IU/L (ref 0–44)
AST: 25 IU/L (ref 0–40)
Albumin/Globulin Ratio: 1.6 (ref 1.2–2.2)
BILIRUBIN TOTAL: 0.5 mg/dL (ref 0.0–1.2)
BUN / CREAT RATIO: 13 (ref 9–20)
BUN: 15 mg/dL (ref 6–24)
CHLORIDE: 92 mmol/L — AB (ref 96–106)
CO2: 20 mmol/L (ref 20–29)
CREATININE: 1.17 mg/dL (ref 0.76–1.27)
Calcium: 9.9 mg/dL (ref 8.7–10.2)
GFR calc Af Amer: 79 mL/min/{1.73_m2} (ref >59)
GFR calc non Af Amer: 68 mL/min/{1.73_m2} (ref >59)
GLOBULIN, TOTAL: 2.7 g/dL (ref 1.5–4.5)
GLUCOSE: 105 mg/dL — AB (ref 65–99)
Potassium: 4.2 mmol/L (ref 3.5–5.2)
SODIUM: 135 mmol/L (ref 134–144)
Total Protein: 7 g/dL (ref 6.0–8.5)

## 2016-08-21 NOTE — Patient Instructions (Signed)
Medication Instructions:  Your physician recommends that you continue on your current medications as directed. Please refer to the Current Medication list given to you today.   Labwork: TODAY - cholesterol, complete metabolic panel   Testing/Procedures: None Ordered   Follow-Up: Your physician wants you to follow-up in: 6 months with Dr. Nahser.  You will receive a reminder letter in the mail two months in advance. If you don't receive a letter, please call our office to schedule the follow-up appointment.   If you need a refill on your cardiac medications before your next appointment, please call your pharmacy.   Thank you for choosing [facility]! Mirabel Ahlgren, RN [phone]    

## 2016-08-21 NOTE — Progress Notes (Signed)
`    Cardiology Office Note   Date:  08/21/2016   ID:  [name], DOB Nov 07, 1955, MRN [id number]  PCP:  Tonia Ghent, MD  Cardiologist:   Mertie Moores, MD   Chief Complaint  Patient presents with  . Hypertension   Problem List 1. Ascending Aortic dissection - s/p repair  2  Cryptogenic stroke 3.  Essential HTN 4. Pulmonary embolus 5 . Hyperlipidemia   History of Present Illness: Gabriel Tran is a 61 y.o. male who presents for follow-up office visit.  This 61 year old gentleman is seen for a scheduled follow-up office visit. He has a past history of an aortic dissection in the setting of uncontrolled hypertension. The dissection was in the ascending aorta. He underwent emergent repair and his postop course was complicated by PEA arrest and pulmonary emboli. He has a long history of hypertension. He presented to [facility] on 01/13/14 with evidence of a left brain stroke with right hemiparesis. His initial blood pressure was in the range of 200/115. He was given intravenous medication to bring his blood pressure down quickly after which he was given TPA. 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  He was discharged on Plavix.  He states that he is also taking a daily aspirin . Since we last saw him he has had a TEE and a implantation of a loop recorder.  The TEE showed no evidence of any source of emboli.  The loop recorder was implanted by Dr. Lovena Le on 01/20/14 for evaluation of cryptogenic stroke.  So far there have been no arrhythmias detected. The patient states that his blood pressure is still running high frequently.  He is concerned because of his known aortic dissection which extends down to the renal artery. He states that he has not been taking his Crestor because it is too expensive. He has not been having any chest pain or shortness of breath.  He still has some clumsiness of his right hand  from the previous residual from stroke.  He is due to see his neurologist Dr. Leonie Man next month.  He will confirm with Dr. Leonie Man concerning his appropriate antiplatelet therapy for his previous stroke.   May 10, 2015:  Previous [facility] patient,     Has asc. Aortic aneurism repair - several complications related  - pulmonary emboli, cardiac tamponade,  August 21, 2016:  Gabriel Tran is seen today  No CP or dyspnea. Staying active.   Not exercising much .   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 Aortic dissection ([place])   . Cardiac tamponade 2013  . Colon polyps 2017  . CVA (cerebral infarction)   . Hyperglycemia    a. A1C 5.9 in Sept 2013.  Marland Kitchen Hypertension   . PE (pulmonary embolism)   . RBBB   . Renal insufficiency 08/28/2012  . Sleep apnea   . Stroke [facility])     Past Surgical History:  Procedure Laterality Date  . CARPAL TUNNEL RELEASE     2004 RIGHT  . COLONOSCOPY    . INSERTION OF DIALYSIS CATHETER  11/06/2011   Procedure: INSERTION OF DIALYSIS CATHETER;  Surgeon: Ivin Poot, MD;  Location: [place];  Service: Thoracic;  Laterality: Right;  . INSERTION OF DIALYSIS CATHETER  11/13/2011   Procedure: INSERTION OF DIALYSIS CATHETER;  Surgeon: Jessy Oto  Fields, MD;  Location: [place];  Service: Vascular;  Laterality: N/A;  Ultrasound guided.  Marland Kitchen KNEE SURGERY     LEFT 1981  . LEFT HEART CATHETERIZATION WITH CORONARY ANGIOGRAM N/A 10/30/2011   Procedure: LEFT HEART CATHETERIZATION WITH CORONARY ANGIOGRAM;  Surgeon: Burnell Blanks, MD;  Location: [facility] CATH LAB;  Service: Cardiovascular;  Laterality: N/A;  . LOOP RECORDER IMPLANT N/A 01/20/2014   MDT LINQ implanted by Dr Lovena Le for cryptogenic stroke  . MEDIASTINAL EXPLORATION  11/06/2011   Procedure: MEDIASTINAL EXPLORATION;  Surgeon: Ivin Poot, MD;  Location: [place];  Service: Thoracic;  Laterality: N/A;  sternal closure; removal of wound vac  .  POLYPECTOMY    . TEE WITHOUT CARDIOVERSION N/A 01/20/2014   Procedure: TRANSESOPHAGEAL ECHOCARDIOGRAM (TEE);  Surgeon: Candee Furbish, MD;  Location: [place];  Service: Cardiovascular;  Laterality: N/A;  . THORACIC AORTIC ANEURYSM REPAIR  10/30/2011   Procedure: THORACIC ASCENDING ANEURYSM REPAIR (AAA);  Surgeon: Ivin Poot, MD;  Location: [place];  Service: Open Heart Surgery;  Laterality: N/A;  Ascending aortic dissection repair, arch reconstruction, aortic valve repair  . WISDOM TOOTH EXTRACTION       Current Outpatient Prescriptions  Medication Sig Dispense Refill  . ALPRAZolam (XANAX) 0.5 MG tablet Take 1 tablet (0.5 mg total) by mouth 2 (two) times daily as needed for anxiety. 30 tablet 1  . amLODipine (NORVASC) 5 MG tablet TAKE 1 TABLET(5 MG) BY MOUTH DAILY 90 tablet 0  . calcium carbonate (OS-CAL - DOSED IN MG OF ELEMENTAL CALCIUM) 1250 (500 Ca) MG tablet Take 1 tablet by mouth daily with breakfast.    . chlorthalidone (HYGROTON) 25 MG tablet Take 1 tablet (25 mg total) by mouth daily. 90 tablet 1  . Cholecalciferol (VITAMIN D3) 5000 units CAPS Take 5,000 Units by mouth daily.    . clopidogrel (PLAVIX) 75 MG tablet Take 1 tablet (75 mg total) by mouth daily. 90 tablet 1  . cyclobenzaprine (FLEXERIL) 10 MG tablet Take 0.5-1 tablets (5-10 mg total) by mouth 3 (three) times daily as needed for muscle spasms (sedation caution). 30 tablet 0  . Menaquinone-7 (VITAMIN K2) 100 MCG CAPS Take 100 mcg by mouth daily.    . metoprolol tartrate (LOPRESSOR) 25 MG tablet Take 25 mg by mouth 2 (two) times daily.    . Multiple Vitamin (MULTIVITAMIN WITH MINERALS) TABS tablet Take 1 tablet by mouth 2 (two) times daily.     . potassium chloride (K-DUR) 10 MEQ tablet TAKE 1 TABLET(10 MEQ) BY MOUTH DAILY 90 tablet 3  . rosuvastatin (CRESTOR) 5 MG tablet Take 1 tablet (5 mg total) by mouth daily at 6 PM. 30 tablet 10  . valsartan (DIOVAN) 320 MG tablet Take 1 tablet (320 mg total) by mouth daily. 90  tablet 1   No current facility-administered medications for this visit.     Allergies:   Carvedilol and Lipitor [atorvastatin]    Social History:  The patient  reports that he quit smoking about 26 years ago. His smoking use included Cigarettes. He has a 25.00 pack-year smoking history. He quit smokeless tobacco use about 4 years ago. His smokeless tobacco use included Snuff. He reports that he drinks about 3.6 oz of alcohol per week . He reports that he does not use drugs.   Family History:  The patient's family history includes Colon polyps in his brother; Heart disease in his brother and father; Hyperlipidemia in his brother, brother, father, and mother; Hypertension in his brother, brother,  and father; Kidney disease in his brother; Leukemia in his father; Parkinsonism in his mother.    ROS:  Please see the history of present illness.   Otherwise, review of systems are positive for none.   All other systems are reviewed and negative.    PHYSICAL EXAM: VS:  BP (!) 150/76   Pulse (!) 58   Ht 5\' 7"  (1.702 m)   Wt 212 lb (96.2 kg)   SpO2 99%   BMI 33.20 kg/m  , BMI Body mass index is 33.2 kg/m. GEN: Well nourished, well developed, in no acute distress  HEENT: normal  Neck: no JVD, carotid bruits, or masses Cardiac: RRR; no murmurs, rubs, or gallops,no edema .  The aortic closure sound is loud and musical  Respiratory:  clear to auscultation bilaterally, normal work of breathing GI: soft, nontender, nondistended, + BS MS: no deformity or atrophy  Skin: warm and dry, no rash Neuro:  Strength and sensation are intact Psych: euthymic mood, full affect   EKG:  EKG is ordered today. The ekg ordered today demonstrates sinus bradycardia with first-degree AV block - HR of 55.  .  Right bundle branch block.  Old anterior infarct.   Recent Labs: 07/24/2016: BUN 14; Creatinine, Ser 1.13; Potassium 3.9; Sodium 130    Lipid Panel    Component Value Date/Time   CHOL 146 08/15/2015  0818   TRIG 68 08/15/2015 0818   HDL 68 08/15/2015 0818   CHOLHDL 2.1 08/15/2015 0818   VLDL 14 08/15/2015 0818   LDLCALC 64 08/15/2015 0818   LDLDIRECT 142.8 08/31/2011 0835      Wt Readings from Last 3 Encounters:  08/21/16 212 lb (96.2 kg)  05/18/16 213 lb (96.6 kg)  01/05/16 210 lb 6.4 oz (95.4 kg)      Other studies Reviewed: Additional studies/ records that were reviewed today include: Discharge summary from [facility] on 01/16/15 Review of the above records demonstrates: Discharged on Plavix.   ASSESSMENT AND PLAN:  1. Recent left brain stroke felt to be probably embolic , successfully treated with TPA. Implantable loop recorder in place, no atrial fibrillation discovered so far.  TEE negative for embolic source.   No further stroke symptoms   2. Essential hypertension, -   Tried Coreg in place of metoprolol - he did not tolerate Coreg.    3. Prior history of a ascending aortic root dissection with emergent surgery .  Known descending aortic dissection.   He will review this with Dr. Prescott Gum.   4. Prior history of PEA arrest and pulmonary emboli postoperatively  5. Hypercholesterolemia, - on crestor  Check labs today   6. Depression    Current medicines are reviewed at length with the patient today.  The patient does not have concerns regarding medicines.  The following changes have been made:  For better blood pressure control we will increase his HCTZ up to 25 mg daily.  We are checking lab work today  Labs/ tests ordered today include: Lipid panel hepatic function panel and basal metabolic panel   No orders of the defined types were placed in this encounter.     Mertie Moores, MD  08/21/2016 9:25 AM    [facility] [place], [address], [place]  [address] Phone: [phone]; Fax: [phone]

## 2016-09-06 ENCOUNTER — Other Ambulatory Visit: Payer: Self-pay | Admitting: Family Medicine

## 2016-09-06 ENCOUNTER — Other Ambulatory Visit: Payer: Self-pay | Admitting: Cardiovascular Disease

## 2016-09-06 NOTE — Telephone Encounter (Signed)
Last OV 08/2016. Last xanax rx #30 1R 11/2015. pls advise

## 2016-09-06 NOTE — Telephone Encounter (Signed)
Due for CPE this fall. Please call in xanax.  Flexeril and Kdur sent. Thanks.

## 2016-09-06 NOTE — Telephone Encounter (Signed)
Medication phoned to pharmacy. Patient advised to schedule CPE this Fall.

## 2016-09-07 ENCOUNTER — Ambulatory Visit (INDEPENDENT_AMBULATORY_CARE_PROVIDER_SITE_OTHER): Payer: BC Managed Care – PPO | Admitting: *Deleted

## 2016-09-07 DIAGNOSIS — I639 Cerebral infarction, unspecified: Secondary | ICD-10-CM

## 2016-09-11 NOTE — Progress Notes (Signed)
Carelink Summary Report / Loop Recorder 

## 2016-09-18 LAB — CUP PACEART REMOTE DEVICE CHECK
Implantable Pulse Generator Implant Date: 20151216
MDC IDC SESS DTM: 20180803144117

## 2016-09-19 ENCOUNTER — Other Ambulatory Visit: Payer: Self-pay | Admitting: Cardiothoracic Surgery

## 2016-09-24 ENCOUNTER — Other Ambulatory Visit: Payer: Self-pay | Admitting: Cardiothoracic Surgery

## 2016-09-24 DIAGNOSIS — R918 Other nonspecific abnormal finding of lung field: Secondary | ICD-10-CM

## 2016-09-26 ENCOUNTER — Ambulatory Visit
Admission: RE | Admit: 2016-09-26 | Discharge: 2016-09-26 | Disposition: A | Discharge status: Home / Self Care | Payer: BC Managed Care – PPO | Source: Ambulatory Visit | Attending: Cardiothoracic Surgery | Admitting: Cardiothoracic Surgery

## 2016-09-26 ENCOUNTER — Ambulatory Visit (INDEPENDENT_AMBULATORY_CARE_PROVIDER_SITE_OTHER): Payer: BC Managed Care – PPO | Admitting: Cardiothoracic Surgery

## 2016-09-26 ENCOUNTER — Encounter: Payer: Self-pay | Admitting: Cardiothoracic Surgery

## 2016-09-26 VITALS — BP 124/65 | HR 56 | Resp 16 | Ht 67.0 in | Wt 212.0 lb

## 2016-09-26 DIAGNOSIS — R918 Other nonspecific abnormal finding of lung field: Secondary | ICD-10-CM

## 2016-09-26 DIAGNOSIS — I71019 Dissection of thoracic aorta, unspecified: Secondary | ICD-10-CM

## 2016-09-26 DIAGNOSIS — I7101 Dissection of thoracic aorta: Secondary | ICD-10-CM

## 2016-09-26 DIAGNOSIS — Z09 Encounter for follow-up examination after completed treatment for conditions other than malignant neoplasm: Secondary | ICD-10-CM | POA: Diagnosis not present

## 2016-09-26 NOTE — Progress Notes (Signed)
PCP is Tonia Ghent, MD Referring Provider is Nahser, Wonda Cheng, MD  Chief Complaint  Patient presents with  . Routine Post Op    1 yr f/u with CTA CHEST..s/p REPAIR AORTIC DISSECTION  10/30/11    HPI: The patient returns for routine 1 year follow-up with CT scan to assess his thoracic aortic disease. 5 years ago the patient underwent surgical repair of type A aortic dissection with a 30 mm Hemashield graft, resuspension of the aortic valve, and reconstruction of the proximal innominate artery with a 10 mm graft. The patient is doing well and is fully employed. His blood pressure is under good control with valsartan and amlodipine. He denies chest pain. His last echocardiogram demonstrated good LV function with mild-moderate AI. He is in a sinus rhythm.  CT scan without contrast today demonstrates the ascending aortic repair to be intact measuring proximal a 3.5 cm in diameter. The arch and distal thoracic aorta are not aneurysmal.  Past Medical History:  Diagnosis Date  . Aortic dissection ([place])   . Cardiac tamponade 2013  . Colon polyps 2017  . CVA (cerebral infarction)   . Hyperglycemia    a. A1C 5.9 in Sept 2013.  Marland Kitchen Hypertension   . PE (pulmonary embolism)   . RBBB   . Renal insufficiency 08/28/2012  . Sleep apnea   . Stroke [facility])     Past Surgical History:  Procedure Laterality Date  . CARPAL TUNNEL RELEASE     2004 RIGHT  . COLONOSCOPY    . INSERTION OF DIALYSIS CATHETER  11/06/2011   Procedure: INSERTION OF DIALYSIS CATHETER;  Surgeon: Ivin Poot, MD;  Location: [place];  Service: Thoracic;  Laterality: Right;  . INSERTION OF DIALYSIS CATHETER  11/13/2011   Procedure: INSERTION OF DIALYSIS CATHETER;  Surgeon: Elam Dutch, MD;  Location: [place];  Service: Vascular;  Laterality: N/A;  Ultrasound guided.  Marland Kitchen KNEE SURGERY     LEFT 1981  . LEFT HEART CATHETERIZATION WITH CORONARY ANGIOGRAM N/A 10/30/2011   Procedure: LEFT HEART CATHETERIZATION WITH CORONARY ANGIOGRAM;   Surgeon: Burnell Blanks, MD;  Location: [facility] CATH LAB;  Service: Cardiovascular;  Laterality: N/A;  . LOOP RECORDER IMPLANT N/A 01/20/2014   MDT LINQ implanted by Dr Lovena Le for cryptogenic stroke  . MEDIASTINAL EXPLORATION  11/06/2011   Procedure: MEDIASTINAL EXPLORATION;  Surgeon: Ivin Poot, MD;  Location: [place];  Service: Thoracic;  Laterality: N/A;  sternal closure; removal of wound vac  . POLYPECTOMY    . TEE WITHOUT CARDIOVERSION N/A 01/20/2014   Procedure: TRANSESOPHAGEAL ECHOCARDIOGRAM (TEE);  Surgeon: Candee Furbish, MD;  Location: [place];  Service: Cardiovascular;  Laterality: N/A;  . THORACIC AORTIC ANEURYSM REPAIR  10/30/2011   Procedure: THORACIC ASCENDING ANEURYSM REPAIR (AAA);  Surgeon: Ivin Poot, MD;  Location: [place];  Service: Open Heart Surgery;  Laterality: N/A;  Ascending aortic dissection repair, arch reconstruction, aortic valve repair  . WISDOM TOOTH EXTRACTION      Family History  Problem Relation Age of Onset  . Parkinsonism Mother   . Hyperlipidemia Mother   . Leukemia Father        AML at 18  . Heart disease Father        Angioplasty in his 63s, CABG age 48.  Marland Kitchen Hypertension Father   . Hyperlipidemia Father   . Colon polyps Brother   . Kidney disease Brother   . Heart disease Brother        CABG at  age 12.  Marland Kitchen Hyperlipidemia Brother   . Hypertension Brother   . Hyperlipidemia Brother   . Hypertension Brother   . Prostate cancer Neg Hx   . Colon cancer Neg Hx     Social History Social History  Substance Use Topics  . Smoking status: Former Smoker    Packs/day: 1.00    Years: 25.00    Types: Cigarettes    Quit date: 02/05/1990  . Smokeless tobacco: Former Systems developer    Types: Snuff    Quit date: 01/05/2012     Comment: Smoked for 20 years.  . Alcohol use 3.6 oz/week    6 Cans of beer per week     Comment: 6 beers a week on average    Current Outpatient Prescriptions  Medication Sig Dispense Refill  . ALPRAZolam (XANAX) 0.5 MG  tablet TAKE 1 TABLET BY MOUTH TWICE DAILY AS NEEDED FOR ANXIETY 30 tablet 0  . amLODipine (NORVASC) 5 MG tablet TAKE 1 TABLET(5 MG) BY MOUTH DAILY 90 tablet 0  . calcium carbonate (OS-CAL - DOSED IN MG OF ELEMENTAL CALCIUM) 1250 (500 Ca) MG tablet Take 1 tablet by mouth daily with breakfast.    . chlorthalidone (HYGROTON) 25 MG tablet Take 1 tablet (25 mg total) by mouth daily. 90 tablet 1  . Cholecalciferol (VITAMIN D3) 5000 units CAPS Take 5,000 Units by mouth daily.    . clopidogrel (PLAVIX) 75 MG tablet Take 1 tablet (75 mg total) by mouth daily. 90 tablet 1  . cyclobenzaprine (FLEXERIL) 10 MG tablet TAKE 1/2 TO 1 TABLET BY MOUTH THREE TIMES DAILY AS NEEDED FOR MUSCLE SPASMS,( SEDATION CAUTION) 30 tablet 0  . Menaquinone-7 (VITAMIN K2) 100 MCG CAPS Take 100 mcg by mouth daily.    . metoprolol tartrate (LOPRESSOR) 25 MG tablet Take 25 mg by mouth 2 (two) times daily.    . Multiple Vitamin (MULTIVITAMIN WITH MINERALS) TABS tablet Take 1 tablet by mouth 2 (two) times daily.     . potassium chloride (K-DUR) 10 MEQ tablet TAKE 1 TABLET(10 MEQ) BY MOUTH DAILY 90 tablet 0  . rosuvastatin (CRESTOR) 5 MG tablet Take 1 tablet (5 mg total) by mouth daily at 6 PM. 30 tablet 10  . valsartan (DIOVAN) 320 MG tablet Take 1 tablet (320 mg total) by mouth daily. 90 tablet 1   No current facility-administered medications for this visit.     Allergies  Allergen Reactions  . Carvedilol     "felt terrible", headache  . 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 No chest pain ankle edema orthopnea No abdominal pain Renal function, urine output stable  BP 124/65 (BP Location: Right Arm, Patient Position: Sitting, Cuff Size: Large)   Pulse (!) 56   Resp 16   Ht 5\' 7"  (1.702 m)   Wt 212 lb (96.2 kg)   SpO2 95% Comment: ON RA  BMI 33.20 kg/m  Physical Exam      Exam    General- alert and comfortable   Lungs- clear  without rales, wheezes   Cor- regular rate and rhythm, no murmur , gallop   Abdomen- soft, non-tender   Extremities - warm, non-tender, minimal edema   Neuro- oriented, appropriate, no focal weakness   Diagnostic Tests: CT scan images personally reviewed and counseled with patient No significant aortic thoracic disease after repair of [place] type A dissection 5 years ago  Impression: With  adequate control blood pressure is thoracic aortic disease should be fine and risk for further dissection minimal. He does not need annual CT scans but needs  careful blood pressure monitoring and treatment by his medical physicians. His aortic insufficiency has been stable for several years.  Plan: Return as needed for thoracic surgical issues.   Len Childs, MD [facility] [phone]

## 2016-10-07 ENCOUNTER — Other Ambulatory Visit: Payer: Self-pay | Admitting: Physician Assistant

## 2016-10-07 ENCOUNTER — Other Ambulatory Visit: Payer: Self-pay | Admitting: Cardiovascular Disease

## 2016-10-09 ENCOUNTER — Ambulatory Visit (INDEPENDENT_AMBULATORY_CARE_PROVIDER_SITE_OTHER): Payer: BC Managed Care – PPO | Admitting: *Deleted

## 2016-10-09 DIAGNOSIS — I639 Cerebral infarction, unspecified: Secondary | ICD-10-CM

## 2016-10-11 NOTE — Progress Notes (Signed)
Carelink Summary Report / Loop Recorder 

## 2016-10-14 LAB — CUP PACEART REMOTE DEVICE CHECK
Date Time Interrogation Session: 20180902154314
Implantable Pulse Generator Implant Date: 20151216

## 2016-11-06 ENCOUNTER — Ambulatory Visit (INDEPENDENT_AMBULATORY_CARE_PROVIDER_SITE_OTHER): Payer: BC Managed Care – PPO | Admitting: *Deleted

## 2016-11-06 DIAGNOSIS — I639 Cerebral infarction, unspecified: Secondary | ICD-10-CM

## 2016-11-06 NOTE — Progress Notes (Signed)
Carelink Summary Report / Loop Recorder 

## 2016-11-08 LAB — CUP PACEART REMOTE DEVICE CHECK
Date Time Interrogation Session: 20181002164135
MDC IDC PG IMPLANT DT: 20151216

## 2016-11-12 ENCOUNTER — Other Ambulatory Visit: Payer: Self-pay | Admitting: Family Medicine

## 2016-11-12 MED ORDER — POTASSIUM CHLORIDE ER 10 MEQ PO TBCR: EXTENDED_RELEASE_TABLET | ORAL | 0 refills | Status: DC

## 2016-12-05 ENCOUNTER — Other Ambulatory Visit: Payer: Self-pay | Admitting: Cardiovascular Disease

## 2016-12-06 ENCOUNTER — Ambulatory Visit (INDEPENDENT_AMBULATORY_CARE_PROVIDER_SITE_OTHER): Payer: BC Managed Care – PPO | Admitting: *Deleted

## 2016-12-06 DIAGNOSIS — I639 Cerebral infarction, unspecified: Secondary | ICD-10-CM

## 2016-12-07 NOTE — Progress Notes (Signed)
Carelink Summary Report / Loop Recorder 

## 2016-12-10 ENCOUNTER — Other Ambulatory Visit: Payer: Self-pay | Admitting: *Deleted

## 2016-12-10 NOTE — Telephone Encounter (Signed)
Faxed refill request. Losartan Last office visit:   11/2015 Last Filled:   ? Medication was added to meds list for refill purposes. No upcoming appts scheduled. Please advise.

## 2016-12-11 LAB — CUP PACEART REMOTE DEVICE CHECK
Date Time Interrogation Session: 20181101174210
MDC IDC PG IMPLANT DT: 20151216

## 2016-12-11 NOTE — Telephone Encounter (Signed)
Verify with patient about dosing, let me know, and schedule OV. Thanks.

## 2016-12-11 NOTE — Telephone Encounter (Signed)
Apparently patient's pharmacy made this suggestion to replace the Valsartan.  Patient says he had been taking Losartan and when he was at his last OV with Cardiology, they wanted to switch to something a little more potent and prescribed Valsartan.  (This must have been previous to the recall.)  Patient will schedule OV.

## 2016-12-12 ENCOUNTER — Other Ambulatory Visit: Payer: Self-pay | Admitting: Family Medicine

## 2016-12-12 MED ORDER — LOSARTAN POTASSIUM 100 MG PO TABS: 100.0000 mg | ORAL_TABLET | Freq: Every day | ORAL | 0 refills | Status: DC

## 2016-12-12 NOTE — Telephone Encounter (Signed)
Sent. Thanks.   

## 2017-01-07 ENCOUNTER — Ambulatory Visit (INDEPENDENT_AMBULATORY_CARE_PROVIDER_SITE_OTHER): Payer: BC Managed Care – PPO | Admitting: *Deleted

## 2017-01-07 DIAGNOSIS — I639 Cerebral infarction, unspecified: Secondary | ICD-10-CM | POA: Diagnosis not present

## 2017-01-07 NOTE — Progress Notes (Signed)
Carelink Summary Report / Loop Recorder 

## 2017-01-15 ENCOUNTER — Other Ambulatory Visit: Payer: Self-pay | Admitting: Family Medicine

## 2017-01-15 LAB — CUP PACEART REMOTE DEVICE CHECK
Date Time Interrogation Session: 20181201184003
MDC IDC PG IMPLANT DT: 20151216

## 2017-01-17 ENCOUNTER — Other Ambulatory Visit: Payer: Self-pay | Admitting: Internal Medicine

## 2017-01-17 MED ORDER — METOPROLOL TARTRATE 25 MG PO TABS: 25.0000 mg | ORAL_TABLET | Freq: Two times a day (BID) | ORAL | 2 refills | Status: DC

## 2017-01-17 NOTE — Addendum Note (Signed)
Addended by: Emmaline Life on: 01/17/2017 08:21 AM   Modules accepted: Orders

## 2017-02-04 ENCOUNTER — Ambulatory Visit (INDEPENDENT_AMBULATORY_CARE_PROVIDER_SITE_OTHER): Payer: BC Managed Care – PPO | Admitting: *Deleted

## 2017-02-04 DIAGNOSIS — I639 Cerebral infarction, unspecified: Secondary | ICD-10-CM

## 2017-02-06 NOTE — Progress Notes (Signed)
Carelink Summary Report / Loop Recorder 

## 2017-02-07 ENCOUNTER — Encounter: Payer: Self-pay | Admitting: Family Medicine

## 2017-02-07 ENCOUNTER — Ambulatory Visit (INDEPENDENT_AMBULATORY_CARE_PROVIDER_SITE_OTHER): Payer: BC Managed Care – PPO | Admitting: Family Medicine

## 2017-02-07 VITALS — BP 142/68 | HR 64 | Temp 98.5°F | Ht 67.0 in | Wt 216.5 lb

## 2017-02-07 DIAGNOSIS — R05 Cough: Secondary | ICD-10-CM

## 2017-02-07 DIAGNOSIS — I7100 Dissection of unspecified site of aorta: Secondary | ICD-10-CM

## 2017-02-07 DIAGNOSIS — Z7189 Other specified counseling: Secondary | ICD-10-CM

## 2017-02-07 DIAGNOSIS — Z0000 Encounter for general adult medical examination without abnormal findings: Secondary | ICD-10-CM

## 2017-02-07 DIAGNOSIS — R059 Cough, unspecified: Secondary | ICD-10-CM

## 2017-02-07 DIAGNOSIS — F419 Anxiety disorder, unspecified: Secondary | ICD-10-CM

## 2017-02-07 DIAGNOSIS — I10 Essential (primary) hypertension: Secondary | ICD-10-CM

## 2017-02-07 MED ORDER — POTASSIUM CHLORIDE ER 10 MEQ PO TBCR: EXTENDED_RELEASE_TABLET | ORAL | 3 refills | Status: DC

## 2017-02-07 MED ORDER — METOPROLOL TARTRATE 50 MG PO TABS: 50.0000 mg | ORAL_TABLET | Freq: Two times a day (BID) | ORAL | 3 refills | Status: DC

## 2017-02-07 MED ORDER — BENZONATATE 200 MG PO CAPS: 200.0000 mg | ORAL_CAPSULE | Freq: Three times a day (TID) | ORAL | 1 refills | Status: DC | PRN

## 2017-02-07 MED ORDER — LOSARTAN POTASSIUM 100 MG PO TABS: 100.0000 mg | ORAL_TABLET | Freq: Every day | ORAL | 3 refills | Status: DC

## 2017-02-07 NOTE — Patient Instructions (Addendum)
Due for follow up shingles shot after 04/05/17.  Tessalon for cough.  When the cough is better, then get back on the treadmill.  Take care.  Glad to see you.

## 2017-02-07 NOTE — Progress Notes (Signed)
CPE- See plan.  Routine anticipatory guidance given to patient.  See health maintenance.  The possibility exists that previously documented standard health maintenance information may have been brought forward from a previous encounter into this note.  If needed, that same information has been updated to reflect the current situation based on today's encounter.    Tetanus 2016 Flu 2018 Shingles- 1st dose was 2018  PNA 2018 Colonoscopy 2017 Prostate cancer screening and PSA options (with potential risks and benefits of testing vs not testing) were discussed along with recent recs/guidelines. He declined testing PSA at this point. Living will d/w pt. Wife designated if patient were incapacitated.  Diet and exercise d/w pt. Has a treadmill at home. Encouraged diet and moderate exercise.  He had a viral illness and finally got better until last week.  Some cough and chest congestion recently.  No fevers.    OSA improved with weight loss prev.    Rare use of xanax.  No ADE on med.  Didn't need refill today.    Hypertension:    Using medication without problems or lightheadedness: yes Chest pain with exertion:no Edema:no Short of breath:no  He had routine f/u with the vascular clinic in 09/2016. Per vascular clinic: with adequate control blood pressure is thoracic aortic disease should be fine and risk for further dissection minimal. He does not need annual CT scans but needs careful blood pressure monitoring and treatment by his medical physicians. His aortic insufficiency has been stable for several years.  PMH and SH reviewed  Meds, vitals, and allergies reviewed.   ROS: Per HPI.  Unless specifically indicated otherwise in HPI, the patient denies:  General: fever. Eyes: acute vision changes ENT: sore throat Cardiovascular: chest pain Respiratory: SOB GI: vomiting GU: dysuria Musculoskeletal: acute back pain Derm: acute rash Neuro: acute motor dysfunction Psych: worsening  mood Endocrine: polydipsia Heme: bleeding Allergy: hayfever  GEN: nad, alert and oriented HEENT: mucous membranes moist NECK: supple w/o LA CV: rrr. PULM: ctab, no inc wob ABD: soft, +bs EXT: no edema SKIN: no acute rash

## 2017-02-08 DIAGNOSIS — R059 Cough, unspecified: Secondary | ICD-10-CM | POA: Insufficient documentation

## 2017-02-08 DIAGNOSIS — R05 Cough: Secondary | ICD-10-CM | POA: Insufficient documentation

## 2017-02-08 NOTE — Assessment & Plan Note (Signed)
Continue as needed Xanax use, used rarely.  No adverse effect of medication.

## 2017-02-08 NOTE — Assessment & Plan Note (Signed)
See above, continue work on BP control.

## 2017-02-08 NOTE — Assessment & Plan Note (Signed)
Living will d/w pt.  Wife designated if patient were incapacitated.   ?

## 2017-02-08 NOTE — Assessment & Plan Note (Signed)
Tetanus 2016 Flu 2018 Shingles- 1st dose was 2018  PNA 2018 Colonoscopy 2017 Prostate cancer screening and PSA options (with potential risks and benefits of testing vs not testing) were discussed along with recent recs/guidelines. He declined testing PSA at this point. Living will d/w pt. Wife designated if patient were incapacitated.  Diet and exercise d/w pt. Has a treadmill at home. Encouraged diet and moderate exercise.  He had a viral illness and finally got better until last week.  Some cough and chest congestion recently.  No fevers.

## 2017-02-08 NOTE — Assessment & Plan Note (Signed)
No change in meds at this point.  Continue work on diet and exercise.  Continue statin.  If persistently elevated then he can update me.  He agrees.

## 2017-02-08 NOTE — Assessment & Plan Note (Signed)
Lungs are clear.  Likely benign viral issue.  Should resolve.  Okay to use Tessalon as needed for cough.  Update me as needed.  He agrees.

## 2017-02-10 ENCOUNTER — Other Ambulatory Visit: Payer: Self-pay | Admitting: Family Medicine

## 2017-02-14 LAB — CUP PACEART REMOTE DEVICE CHECK
Implantable Pulse Generator Implant Date: 20151216
MDC IDC SESS DTM: 20181231184028

## 2017-03-05 ENCOUNTER — Other Ambulatory Visit: Payer: Self-pay | Admitting: Family Medicine

## 2017-03-06 ENCOUNTER — Ambulatory Visit (INDEPENDENT_AMBULATORY_CARE_PROVIDER_SITE_OTHER): Payer: BC Managed Care – PPO | Admitting: *Deleted

## 2017-03-06 DIAGNOSIS — I639 Cerebral infarction, unspecified: Secondary | ICD-10-CM | POA: Diagnosis not present

## 2017-03-07 NOTE — Progress Notes (Signed)
Carelink Summary Report / Loop Recorder 

## 2017-03-19 ENCOUNTER — Other Ambulatory Visit: Payer: Self-pay | Admitting: *Deleted

## 2017-03-19 LAB — CUP PACEART REMOTE DEVICE CHECK
Implantable Pulse Generator Implant Date: 20151216
MDC IDC SESS DTM: 20190130191056

## 2017-03-19 NOTE — Telephone Encounter (Signed)
Faxed refill request.  Alprazolam Last office visit:   02/07/2017 Last Filled:    30 tablet 0 09/06/2016  I believe this to be a mail order pharmacy so I'm supposing they need a 90 day supply.  Please advise.

## 2017-03-20 MED ORDER — ALPRAZOLAM 0.5 MG PO TABS: 0.5000 mg | ORAL_TABLET | Freq: Two times a day (BID) | ORAL | 1 refills | Status: DC | PRN

## 2017-03-20 NOTE — Telephone Encounter (Signed)
Sent.  Used rarely so #30 should be reasonable.  Thanks.

## 2017-03-30 ENCOUNTER — Other Ambulatory Visit: Payer: Self-pay | Admitting: Family Medicine

## 2017-04-01 NOTE — Telephone Encounter (Signed)
Electronic refill request. Benzonatate Last office visit:   02/07/17 Last Filled:    30 capsule 1 02/07/2017  Please advise.

## 2017-04-03 MED ORDER — BENZONATATE 200 MG PO CAPS: ORAL_CAPSULE | ORAL | 0 refills | Status: DC

## 2017-04-03 NOTE — Telephone Encounter (Signed)
Patient notified as instructed by telephone. Patient stated that this must have been an automatic refill request, but that he still has a cough and will pick them up. Patient stated that he will follow-up if needed.

## 2017-04-03 NOTE — Telephone Encounter (Signed)
Sent. F/u if cough not improved.  Thanks.

## 2017-04-08 ENCOUNTER — Ambulatory Visit (INDEPENDENT_AMBULATORY_CARE_PROVIDER_SITE_OTHER): Payer: BC Managed Care – PPO | Admitting: *Deleted

## 2017-04-08 DIAGNOSIS — I639 Cerebral infarction, unspecified: Secondary | ICD-10-CM

## 2017-04-09 NOTE — Progress Notes (Signed)
Carelink Summary Report / Loop Recorder 

## 2017-04-22 ENCOUNTER — Other Ambulatory Visit: Payer: Self-pay | Admitting: Family Medicine

## 2017-04-22 NOTE — Telephone Encounter (Signed)
Electronic refill request Lat refill 04/03/17 #30 Last office visit 02/07/17

## 2017-04-23 NOTE — Telephone Encounter (Signed)
Patient doesn't need it, must be an auto refill request.  Rx refused.

## 2017-04-23 NOTE — Telephone Encounter (Signed)
Verify his pharmacy and then sent. F/u if cough isn't better.  Thanks.

## 2017-04-29 ENCOUNTER — Other Ambulatory Visit: Payer: Self-pay | Admitting: Internal Medicine

## 2017-05-13 ENCOUNTER — Ambulatory Visit (INDEPENDENT_AMBULATORY_CARE_PROVIDER_SITE_OTHER): Payer: BC Managed Care – PPO | Admitting: *Deleted

## 2017-05-13 DIAGNOSIS — I639 Cerebral infarction, unspecified: Secondary | ICD-10-CM

## 2017-05-13 LAB — CUP PACEART REMOTE DEVICE CHECK
Implantable Pulse Generator Implant Date: 20151216
MDC IDC SESS DTM: 20190304211329

## 2017-05-14 NOTE — Progress Notes (Signed)
Carelink Summary Report / Loop Recorder 

## 2017-05-16 ENCOUNTER — Telehealth: Payer: Self-pay | Admitting: *Deleted

## 2017-05-16 NOTE — Telephone Encounter (Signed)
LMOVM (DPR) advising that LINQ is at [facility] (as of 05/08/17).  [facility] phone number for return call to discuss options.

## 2017-05-20 NOTE — Telephone Encounter (Signed)
Patient returned call.  He declines an appointment with Dr. Lovena Le to discuss plan or explant.  He would prefer to leave LINQ in place at this time.  Patient agreeable to receiving return kit to home address, aware to unplug home monitor.  Unenrolled from [place], return kit ordered.  Patient aware to call with questions or concerns, or to reorder return kit if it is not received in 4-6 weeks.  He denies questions at this time.

## 2017-06-13 ENCOUNTER — Other Ambulatory Visit: Payer: Self-pay | Admitting: Internal Medicine

## 2017-06-14 LAB — CUP PACEART REMOTE DEVICE CHECK
Date Time Interrogation Session: 20190406220727
Implantable Pulse Generator Implant Date: 20151216

## 2017-08-03 ENCOUNTER — Other Ambulatory Visit: Payer: Self-pay | Admitting: Cardiovascular Disease

## 2017-08-30 ENCOUNTER — Other Ambulatory Visit: Payer: Self-pay | Admitting: Cardiovascular Disease

## 2017-09-05 ENCOUNTER — Other Ambulatory Visit: Payer: Self-pay | Admitting: Cardiovascular Disease

## 2017-09-05 MED ORDER — ROSUVASTATIN CALCIUM 5 MG PO TABS: 5.0000 mg | ORAL_TABLET | Freq: Every day | ORAL | 0 refills | Status: DC

## 2017-10-05 ENCOUNTER — Other Ambulatory Visit: Payer: Self-pay | Admitting: Cardiovascular Disease

## 2017-11-08 ENCOUNTER — Other Ambulatory Visit: Payer: Self-pay | Admitting: Cardiovascular Disease

## 2017-11-16 ENCOUNTER — Other Ambulatory Visit: Payer: Self-pay | Admitting: Cardiovascular Disease

## 2017-11-17 ENCOUNTER — Other Ambulatory Visit: Payer: Self-pay | Admitting: Cardiovascular Disease

## 2017-11-29 ENCOUNTER — Other Ambulatory Visit: Payer: Self-pay | Admitting: Cardiovascular Disease

## 2017-11-29 NOTE — Telephone Encounter (Signed)
(  smartpharses not working)  30 day supply amLODipine (NORVASC) clopidogrel (PLAVIX)  losartan (COZAAR)  chlorthalidone (HYGROTON) rosuvastatin (CRESTOR)  [id number] - [place], [address] AT [address]

## 2017-12-02 MED ORDER — CLOPIDOGREL BISULFATE 75 MG PO TABS: 75.0000 mg | ORAL_TABLET | Freq: Every day | ORAL | 0 refills | Status: DC

## 2017-12-02 MED ORDER — AMLODIPINE BESYLATE 5 MG PO TABS: 5.0000 mg | ORAL_TABLET | Freq: Every day | ORAL | 0 refills | Status: DC

## 2017-12-02 MED ORDER — CHLORTHALIDONE 25 MG PO TABS: ORAL_TABLET | ORAL | 0 refills | Status: DC

## 2017-12-02 MED ORDER — ROSUVASTATIN CALCIUM 5 MG PO TABS: 5.0000 mg | ORAL_TABLET | Freq: Every day | ORAL | 0 refills | Status: DC

## 2017-12-02 MED ORDER — LOSARTAN POTASSIUM 100 MG PO TABS: 100.0000 mg | ORAL_TABLET | Freq: Every day | ORAL | 0 refills | Status: DC

## 2017-12-02 NOTE — Telephone Encounter (Signed)
Pt's medication was sent to pt's pharmacy as requested. Confirmation received.  °

## 2017-12-13 ENCOUNTER — Other Ambulatory Visit: Payer: Self-pay | Admitting: Cardiovascular Disease

## 2017-12-19 ENCOUNTER — Encounter: Payer: Self-pay | Admitting: Cardiovascular Disease

## 2017-12-20 ENCOUNTER — Encounter: Payer: Self-pay | Admitting: Cardiovascular Disease

## 2017-12-20 ENCOUNTER — Ambulatory Visit: Payer: BC Managed Care – PPO | Admitting: Cardiovascular Disease

## 2017-12-20 VITALS — BP 144/70 | HR 95 | Ht 67.0 in | Wt 220.0 lb

## 2017-12-20 DIAGNOSIS — I10 Essential (primary) hypertension: Secondary | ICD-10-CM

## 2017-12-20 DIAGNOSIS — Z9889 Other specified postprocedural states: Secondary | ICD-10-CM | POA: Diagnosis not present

## 2017-12-20 DIAGNOSIS — Z8679 Personal history of other diseases of the circulatory system: Secondary | ICD-10-CM

## 2017-12-20 DIAGNOSIS — E782 Mixed hyperlipidemia: Secondary | ICD-10-CM | POA: Diagnosis not present

## 2017-12-20 MED ORDER — CHLORTHALIDONE 25 MG PO TABS: 25.0000 mg | ORAL_TABLET | Freq: Every day | ORAL | 3 refills | Status: DC

## 2017-12-20 MED ORDER — LOSARTAN POTASSIUM 100 MG PO TABS: 100.0000 mg | ORAL_TABLET | Freq: Every day | ORAL | 3 refills | Status: DC

## 2017-12-20 MED ORDER — ROSUVASTATIN CALCIUM 5 MG PO TABS: 5.0000 mg | ORAL_TABLET | Freq: Every day | ORAL | 3 refills | Status: DC

## 2017-12-20 MED ORDER — CLOPIDOGREL BISULFATE 75 MG PO TABS: 75.0000 mg | ORAL_TABLET | Freq: Every day | ORAL | 3 refills | Status: DC

## 2017-12-20 MED ORDER — AMLODIPINE BESYLATE 5 MG PO TABS: 5.0000 mg | ORAL_TABLET | Freq: Every day | ORAL | 3 refills | Status: DC

## 2017-12-20 NOTE — Patient Instructions (Addendum)
Medication Instructions:  Your physician recommends that you continue on your current medications as directed. Please refer to the Current Medication list given to you today.  If you need a refill on your cardiac medications before your next appointment, please call your pharmacy.    Lab work: TODAY - cholesterol, liver panel, basic metabolic panel    Testing/Procedures: None Ordered   Follow-Up: At [facility], you and your health needs are our priority.  As part of our continuing mission to provide you with exceptional heart care, we have created designated Provider Care Teams.  These Care Teams include your primary Cardiologist (physician) and Advanced Practice Providers (APPs -  Physician Assistants and Nurse Practitioners) who all work together to provide you with the care you need, when you need it. You will need a follow up appointment in:  1 years.  Please call our office 2 months in advance to schedule this appointment.  You may see Mertie Moores, MD or one of the following Advanced Practice Providers on your designated Care Team: Richardson Dopp, PA-C [place], [place] . Daune Perch, NP

## 2017-12-20 NOTE — Progress Notes (Signed)
`    Cardiology Office Note   Date:  12/20/2017   ID:  Gabriel Tran, DOB 12/24/1955, MRN [id number]  PCP:  Tonia Ghent, MD  Cardiologist:   Mertie Moores, MD   Chief Complaint  Patient presents with  . Hypertension   Problem List 1. Ascending Aortic dissection - s/p repair  2  Cryptogenic stroke 3.  Essential HTN 4. Pulmonary embolus 5 . Hyperlipidemia     Gabriel Tran is a 62 y.o. male who presents for follow-up office visit.  This 62 y.o.  gentleman is seen for a scheduled follow-up office visit. He has a past history of an aortic dissection in the setting of uncontrolled hypertension. The dissection was in the ascending aorta. He underwent emergent repair and his postop course was complicated by PEA arrest and pulmonary emboli. He has a long history of hypertension. He presented to [facility] on 01/13/14 with evidence of a left brain stroke with right hemiparesis. His initial blood pressure was in the range of 200/115. He was given intravenous medication to bring his blood pressure down quickly after which he was given TPA. 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  He was discharged on Plavix.  He states that he is also taking a daily aspirin . Since we last saw him he has had a TEE and a implantation of a loop recorder.  The TEE showed no evidence of any source of emboli.  The loop recorder was implanted by Dr. Lovena Le on 01/20/14 for evaluation of cryptogenic stroke.  So far there have been no arrhythmias detected. The patient states that his blood pressure is still running high frequently.  He is concerned because of his known aortic dissection which extends down to the renal artery. He states that he has not been taking his Crestor because it is too expensive. He has not been having any chest pain or shortness of breath.  He still has some clumsiness of his right hand from the previous residual  from stroke.  He is due to see his neurologist Dr. Leonie Man next month.  He will confirm with Dr. Leonie Man concerning his appropriate antiplatelet therapy for his previous stroke.   May 10, 2015:  Previous [facility] patient,     Has asc. Aortic aneurism repair - several complications related  - pulmonary emboli, cardiac tamponade,  August 21, 2016:  Gabriel Tran is seen today  No CP or dyspnea. Staying active.   Not exercising much .   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 Aneurism repair   Still eating salty foods BP is a bit high . Very little exercise   Past Medical History:  Diagnosis Date  . Aortic dissection ([place])   . Cardiac tamponade 2013  . Colon polyps 2017  . CVA (cerebral infarction)   . Hyperglycemia    a. A1C 5.9 in Sept 2013.  Marland Kitchen Hypertension   . PE (pulmonary embolism)   . RBBB   . Renal insufficiency 08/28/2012  . Sleep apnea    history of  . Stroke [facility])     Past Surgical History:  Procedure Laterality Date  . CARPAL TUNNEL RELEASE     2004 RIGHT  . COLONOSCOPY    . INSERTION OF DIALYSIS CATHETER  11/06/2011   Procedure: INSERTION OF DIALYSIS CATHETER;  Surgeon: Ivin Poot, MD;  Location: [facility] OR;  Service: Thoracic;  Laterality: Right;  . INSERTION OF DIALYSIS CATHETER  11/13/2011   Procedure: INSERTION OF DIALYSIS CATHETER;  Surgeon: Elam Dutch, MD;  Location: [place];  Service: Vascular;  Laterality: N/A;  Ultrasound guided.  Marland Kitchen KNEE SURGERY     LEFT 1981  . LEFT HEART CATHETERIZATION WITH CORONARY ANGIOGRAM N/A 10/30/2011   Procedure: LEFT HEART CATHETERIZATION WITH CORONARY ANGIOGRAM;  Surgeon: Burnell Blanks, MD;  Location: [facility] CATH LAB;  Service: Cardiovascular;  Laterality: N/A;  . LOOP RECORDER IMPLANT N/A 01/20/2014   MDT LINQ implanted by Dr Lovena Le for cryptogenic stroke  . MEDIASTINAL EXPLORATION  11/06/2011   Procedure: MEDIASTINAL EXPLORATION;  Surgeon: Ivin Poot, MD;  Location: [place];  Service: Thoracic;  Laterality: N/A;  sternal closure; removal of wound vac  . POLYPECTOMY    . TEE WITHOUT CARDIOVERSION N/A 01/20/2014   Procedure: TRANSESOPHAGEAL ECHOCARDIOGRAM (TEE);  Surgeon: Candee Furbish, MD;  Location: [place];  Service: Cardiovascular;  Laterality: N/A;  . THORACIC AORTIC ANEURYSM REPAIR  10/30/2011   Procedure: THORACIC ASCENDING ANEURYSM REPAIR (AAA);  Surgeon: Ivin Poot, MD;  Location: [place];  Service: Open Heart Surgery;  Laterality: N/A;  Ascending aortic dissection repair, arch reconstruction, aortic valve repair  . WISDOM TOOTH EXTRACTION       Current Outpatient Medications  Medication Sig Dispense Refill  . ALPRAZolam (XANAX) 0.5 MG tablet Take 1 tablet (0.5 mg total) by mouth 2 (two) times daily as needed. for anxiety 30 tablet 1  . amLODipine (NORVASC) 5 MG tablet Take 1 tablet (5 mg total) by mouth daily. 90 tablet 3  . benzonatate (TESSALON) 200 MG capsule TAKE 1 CAPSULE(200 MG) BY MOUTH THREE TIMES DAILY AS NEEDED 30 capsule 0  . calcium carbonate (OS-CAL - DOSED IN MG OF ELEMENTAL CALCIUM) 1250 (500 Ca) MG tablet Take 1 tablet by mouth daily with breakfast.    . chlorthalidone (HYGROTON) 25 MG tablet Take 1 tablet (25 mg total) by mouth daily. 90 tablet 3  . Cholecalciferol (VITAMIN D3) 5000 units CAPS Take 5,000 Units by mouth daily.    . clopidogrel (PLAVIX) 75 MG tablet Take 1 tablet (75 mg total) by mouth daily. 90 tablet 3  . cyclobenzaprine (FLEXERIL) 10 MG tablet TAKE 1/2 TO 1 TABLET BY MOUTH THREE TIMES DAILY AS NEEDED FOR MUSCLE SPASMS,( SEDATION CAUTION) 30 tablet 0  . losartan (COZAAR) 100 MG tablet Take 1 tablet (100 mg total) by mouth daily. 90 tablet 3  . meloxicam (MOBIC) 15 MG tablet Take 1 tablet by mouth daily as needed for spasms.  0  . Menaquinone-7 (VITAMIN K2) 100 MCG CAPS Take 100 mcg by mouth daily.    . methocarbamol (ROBAXIN) 500 MG tablet Take 1 tablet by mouth daily as needed for spasms.   0  . metoprolol tartrate (LOPRESSOR) 50 MG tablet Take 1 tablet (50 mg total) by mouth 2 (two) times daily. 180 tablet 3  . Multiple Vitamin (MULTIVITAMIN WITH MINERALS) TABS tablet Take 1 tablet by mouth 2 (two) times daily.     . potassium chloride (K-DUR) 10 MEQ tablet TAKE 1 TABLET(10 MEQ) BY MOUTH DAILY 90 tablet 3  . rosuvastatin (CRESTOR) 5 MG tablet Take 1 tablet (5 mg total) by mouth daily. 90 tablet 3  . traMADol (ULTRAM) 50 MG tablet Take 1 tablet by mouth daily as needed for pain. MAX OF 4 TABLETS PER DAY  0   No current facility-administered medications for this  visit.     Allergies:   Carvedilol and Lipitor [atorvastatin]    Social History:  The patient  reports that he quit smoking about 27 years ago. His smoking use included cigarettes. He has a 25.00 pack-year smoking history. He quit smokeless tobacco use about 5 years ago.  His smokeless tobacco use included snuff. He reports that he drinks about 6.0 standard drinks of alcohol per week. He reports that he does not use drugs.   Family History:  The patient's family history includes Colon polyps in his brother; Heart disease in his brother and father; Hyperlipidemia in his brother, brother, father, and mother; Hypertension in his brother, brother, and father; Kidney disease in his brother; Leukemia in his father; Parkinsonism in his mother.    ROS:  Please see the history of present illness.   Otherwise, review of systems are positive for none.   All other systems are reviewed and negative.    Physical Exam: Blood pressure (!) 144/70, pulse 95, height 5\' 7"  (1.702 m), weight 220 lb (99.8 kg), SpO2 95 %.  GEN:   Middle age male, NAD  HEENT: Normal NECK: No JVD; No carotid bruits LYMPHATICS: No lymphadenopathy CARDIAC: RR , accentuated S2.  , soft systolic murmur  RESPIRATORY:  Clear to auscultation without rales, wheezing or rhonchi  ABDOMEN: Soft, non-tender, non-distended MUSCULOSKELETAL:  No edema; No deformity    SKIN: Warm and dry NEUROLOGIC:  Alert and oriented x 3   EKG: December 20, 2017: Sinus bradycardia at 58.  Occasional PACs with aberrant conduction versus PVCs.  Nonspecific interventricular conduction delay.  Recent Labs: No results found for requested labs within last 8760 hours.    Lipid Panel    Component Value Date/Time   CHOL 178 08/21/2016 0948   TRIG 64 08/21/2016 0948   HDL 68 08/21/2016 0948   CHOLHDL 2.6 08/21/2016 0948   CHOLHDL 2.1 08/15/2015 0818   VLDL 14 08/15/2015 0818   LDLCALC 97 08/21/2016 0948   LDLDIRECT 142.8 08/31/2011 0835      Wt Readings from Last 3 Encounters:  12/20/17 220 lb (99.8 kg)  02/07/17 216 lb 8 oz (98.2 kg)  09/26/16 212 lb (96.2 kg)      Other studies Reviewed: Additional studies/ records that were reviewed today include: Discharge summary from [facility] on 01/16/15 Review of the above records demonstrates: Discharged on Plavix.   ASSESSMENT AND PLAN:  1. Recent left brain stroke felt to be probably embolic , successfully treated with TPA.    No further stroke symptoms   2. Essential hypertension, -   BP  Is mildly elevated.  Still eats a salty diet. Does not exercise I've stressed the importance of better and diet and exercise regimin  3. Prior history of a ascending aortic root dissection with emergent surgery .  Known descending aortic dissection.       4. Prior history of PEA arrest and pulmonary emboli postoperatively   5. Hypercholesterolemia, -    Check labs today   6. Depression    Current medicines are reviewed at length with the patient today.  The patient does not have concerns regarding medicines.   Labs/ tests ordered today include: Lipid panel hepatic function panel and basal metabolic panel   Orders Placed This Encounter  Procedures  . Lipid Profile  . Basic Metabolic Panel (BMET)  . Hepatic function panel  . EKG 12-Lead      Mertie Moores, MD  12/20/2017 4:24 PM    [facility] [place] [facility] [place], [place], [place]  [address] Phone: [phone]; Fax: [phone]

## 2017-12-21 LAB — BASIC METABOLIC PANEL
BUN/Creatinine Ratio: 12 (ref 10–24)
BUN: 16 mg/dL (ref 8–27)
CO2: 26 mmol/L (ref 20–29)
Calcium: 9.9 mg/dL (ref 8.6–10.2)
Chloride: 92 mmol/L — ABNORMAL LOW (ref 96–106)
Creatinine, Ser: 1.3 mg/dL — ABNORMAL HIGH (ref 0.76–1.27)
GFR, EST AFRICAN AMERICAN: 69 mL/min/{1.73_m2} (ref >59)
GFR, EST NON AFRICAN AMERICAN: 59 mL/min/{1.73_m2} — AB (ref >59)
Glucose: 126 mg/dL — ABNORMAL HIGH (ref 65–99)
POTASSIUM: 4.5 mmol/L (ref 3.5–5.2)
SODIUM: 132 mmol/L — AB (ref 134–144)

## 2017-12-21 LAB — HEPATIC FUNCTION PANEL
ALT: 16 IU/L (ref 0–44)
AST: 18 IU/L (ref 0–40)
Albumin: 4.5 g/dL (ref 3.6–4.8)
Alkaline Phosphatase: 55 IU/L (ref 39–117)
BILIRUBIN TOTAL: 0.7 mg/dL (ref 0.0–1.2)
BILIRUBIN, DIRECT: 0.23 mg/dL (ref 0.00–0.40)
Total Protein: 7.3 g/dL (ref 6.0–8.5)

## 2017-12-21 LAB — LIPID PANEL
CHOLESTEROL TOTAL: 164 mg/dL (ref 100–199)
Chol/HDL Ratio: 2.1 ratio (ref 0.0–5.0)
HDL: 77 mg/dL (ref >39)
LDL CALC: 78 mg/dL (ref 0–99)
TRIGLYCERIDES: 43 mg/dL (ref 0–149)
VLDL CHOLESTEROL CAL: 9 mg/dL (ref 5–40)

## 2017-12-25 ENCOUNTER — Telehealth: Payer: Self-pay | Admitting: *Deleted

## 2017-12-25 NOTE — Telephone Encounter (Signed)
   [facility] Pre-operative Risk Assessment    Request for surgical clearance:  1. What type of surgery is being performed? INJECTION   2. When is this surgery scheduled?  TBD   3. What type of clearance is required (medical clearance vs. Pharmacy clearance to hold med vs. Both)?  BOTH   4. Are there any medications that need to be held prior to surgery and how long? HOLD PLAVIX FOR 7 DAYS   5. Practice name and name of physician performing surgery? [facility]   6. What is your office phone number [phone]   7.   What is your office fax [phone]  8.   Anesthesia type (None, local, MAC, general) ? NONE   Devra Dopp 12/25/2017, 11:55 AM  _________________________________________________________________   (provider comments below)

## 2017-12-26 NOTE — Telephone Encounter (Signed)
Dr Acie Fredrickson can you comment on the request to hold Plavix for 7 days.  Kerin Ransom PA-C 12/26/2017 4:15 PM

## 2017-12-27 NOTE — Telephone Encounter (Signed)
Patient was last seen 11/2014.

## 2017-12-27 NOTE — Telephone Encounter (Signed)
He is on Plavix for his previous CVA. Dr. Leonie Man will need to comment on this

## 2017-12-27 NOTE — Telephone Encounter (Signed)
   Primary Cardiologist: Mertie Moores, MD  Chart reviewed as part of pre-operative protocol coverage. Given past medical history and time since last visit, based on ACC/AHA guidelines, DA AUTHEMENT would be at acceptable risk for the planned procedure from a cardiac standpoint without further cardiovascular testing.   He is Plavix related to stroke and holding his Plavix will have to be determined by Dr. Leonie Man, neurologist. I will route this to Dr. Leonie Man for his input.   I will route this recommendation to the requesting party via Epic fax function and remove from pre-op pool.  Please call with questions.  Daune Perch, NP 12/27/2017, 11:43 AM

## 2017-12-30 NOTE — Telephone Encounter (Signed)
Patient may hold Plavix for 5 days prior to the procedure and resume it after the procedure when safe with a small but acceptable peri-procedure and risk of TIA/stroke.if patient willing as his last stroke was in December 2015 and he has done well

## 2017-12-31 NOTE — Telephone Encounter (Signed)
Covering Nina's inbox. Routing back to pre-op box to handle

## 2017-12-31 NOTE — Telephone Encounter (Signed)
   Primary Cardiologist: Mertie Moores, MD  Chart reviewed as part of pre-operative protocol coverage.   Please see recommendations below from Dr. Leonie Man (neurologist) regarding Plavix.  Richardson Dopp, PA-C 12/31/2017, 4:07 PM

## 2017-12-31 NOTE — Telephone Encounter (Signed)
Spoke with Lovena Le from [facility] [place] and they will call pre-op pool to let us know if they have received the clearance letter.

## 2017-12-31 NOTE — Telephone Encounter (Signed)
   Call back staff: Marland Kitchen This note has been faxed to the requesting surgeon. . Please contact the surgeon's office to ensure it has been received. . This phone note will be removed from the preop pool. . Please sign encounter when completed.  Richardson Dopp, PA-C    12/31/2017 4:09 PM

## 2018-01-01 ENCOUNTER — Other Ambulatory Visit: Payer: Self-pay | Admitting: Cardiovascular Disease

## 2018-01-01 NOTE — Telephone Encounter (Signed)
Outpatient Medication Detail    Disp Refills Start End   chlorthalidone (HYGROTON) 25 MG tablet 90 tablet 3 12/20/2017    Sig - Route: Take 1 tablet (25 mg total) by mouth daily. - Oral   Sent to pharmacy as: chlorthalidone (HYGROTON) 25 MG tablet   E-Prescribing Status: Receipt confirmed by pharmacy (12/20/2017 4:22 PM EST)   Pharmacy   [facility], [place]

## 2018-01-01 NOTE — Telephone Encounter (Signed)
CLEARANCE MANUALLY FAXED TO OFFICE

## 2018-01-08 ENCOUNTER — Other Ambulatory Visit: Payer: Self-pay | Admitting: Cardiovascular Disease

## 2018-01-08 MED ORDER — AMLODIPINE BESYLATE 5 MG PO TABS: 5.0000 mg | ORAL_TABLET | Freq: Every day | ORAL | 3 refills | Status: DC

## 2018-01-12 ENCOUNTER — Other Ambulatory Visit: Payer: Self-pay | Admitting: Family Medicine

## 2018-01-24 ENCOUNTER — Other Ambulatory Visit: Payer: Self-pay | Admitting: *Deleted

## 2018-01-24 NOTE — Telephone Encounter (Signed)
Faxed refill request. Alprazolam .Last office visit:   02/07/17 Last Filled:    30 tablet 1 03/20/2017  Please advise.

## 2018-01-26 MED ORDER — ALPRAZOLAM 0.5 MG PO TABS: 0.5000 mg | ORAL_TABLET | Freq: Two times a day (BID) | ORAL | 0 refills | Status: DC | PRN

## 2018-01-26 NOTE — Telephone Encounter (Signed)
Needs yearly CPE scheduled.  Sent. Thanks.

## 2018-01-31 ENCOUNTER — Other Ambulatory Visit: Payer: Self-pay | Admitting: Family Medicine

## 2018-01-31 NOTE — Telephone Encounter (Deleted)
Electronic refill request. Alprazolam Last office visit:    

## 2018-02-14 ENCOUNTER — Other Ambulatory Visit: Payer: Self-pay | Admitting: Family Medicine

## 2018-02-14 NOTE — Telephone Encounter (Signed)
Last Rx given on 12/22 for #30 with no ref

## 2018-02-16 NOTE — Telephone Encounter (Signed)
Sent.  Needs yearly CPE scheduled.  Thanks.

## 2018-02-17 ENCOUNTER — Encounter: Payer: Self-pay | Admitting: *Deleted

## 2018-02-17 NOTE — Telephone Encounter (Signed)
Letter mailed

## 2018-02-18 ENCOUNTER — Other Ambulatory Visit: Payer: Self-pay | Admitting: *Deleted

## 2018-02-20 ENCOUNTER — Other Ambulatory Visit: Payer: Self-pay | Admitting: Family Medicine

## 2018-05-06 ENCOUNTER — Other Ambulatory Visit: Payer: Self-pay | Admitting: Family Medicine

## 2018-05-06 NOTE — Telephone Encounter (Signed)
Last office visit 02/07/2017 for CPE.  Last refilled 01/13/2018 with note that patient needs to make an appointment for CPE prior to further refills.  No future appointments.  Refill request is coming from [place].  Refill?

## 2018-05-07 NOTE — Telephone Encounter (Signed)
What is his status?  Is he going to f/u here?  Why is the refill coming from [place]?

## 2018-05-08 NOTE — Telephone Encounter (Signed)
Spoke to pt. 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 I advised him I would send in a 90 day supply and that he needs to be seen in the next 3 months for either a CPE or an OV for his BP. Please decide what type of visit he needs and send to [place]. He could do a Webex for his BP.

## 2018-05-09 NOTE — Telephone Encounter (Signed)
Webex re: BP when possible.  Thanks.

## 2018-05-09 NOTE — Telephone Encounter (Signed)
Webex scheduled 05/13/18 @ 9:30am

## 2018-05-13 ENCOUNTER — Ambulatory Visit (INDEPENDENT_AMBULATORY_CARE_PROVIDER_SITE_OTHER): Payer: 59 | Admitting: Family Medicine

## 2018-05-13 ENCOUNTER — Other Ambulatory Visit: Payer: Self-pay

## 2018-05-13 DIAGNOSIS — M545 Low back pain, unspecified: Secondary | ICD-10-CM

## 2018-05-13 DIAGNOSIS — F419 Anxiety disorder, unspecified: Secondary | ICD-10-CM

## 2018-05-13 DIAGNOSIS — I10 Essential (primary) hypertension: Secondary | ICD-10-CM

## 2018-05-13 MED ORDER — CYCLOBENZAPRINE HCL 10 MG PO TABS: ORAL_TABLET | ORAL | Status: DC

## 2018-05-13 MED ORDER — POTASSIUM CHLORIDE ER 10 MEQ PO TBCR: 10.0000 meq | EXTENDED_RELEASE_TABLET | Freq: Every day | ORAL | 1 refills | Status: DC

## 2018-05-13 NOTE — Progress Notes (Signed)
Virtual visit completed through WebEx.  Patient location: home  Provider location: [facility] at [facility], office   He had prev injection for his back pain with temporary relief.  He is considering going to get 3rd injection at some point with possible consideration of surgery thereafter.   More pain later in the day, with more activity.  Tramadol and robaixin help with pain.    Rare use of xanax, used prn. No ADE on med.   Hypertension:    Using medication without problems or lightheadedness: yes Chest pain with exertion:no Edema:no Short of breath:no Average home BPs: 140/69 today.  Pulse 52 Can still push his trash cans up a slope, can walk on a treadmill.   Weight is steady per patient report.   Diet and exercise d/w pt.    Meds and allergies reviewed.   ROS: Per HPI unless specifically indicated in ROS section   NAD Speech wnl No edema seen with positioning of web cam.

## 2018-05-13 NOTE — Assessment & Plan Note (Signed)
He had prev injection for his back pain with temporary relief.  He is considering going to get 3rd injection at some point with possible consideration of surgery thereafter.   More pain later in the day, with more activity.  Tramadol and robaixin help with pain.   I'll defer.  He agrees.

## 2018-05-13 NOTE — Assessment & Plan Note (Signed)
Rare use of xanax, used prn. No ADE on med.

## 2018-05-13 NOTE — Assessment & Plan Note (Signed)
No change in meds.  Continue work on diet and exercise.  He agrees.  Prev labs d/w pt.  He'll update me as needed.

## 2018-07-03 ENCOUNTER — Other Ambulatory Visit: Payer: Self-pay | Admitting: Family Medicine

## 2018-07-03 NOTE — Telephone Encounter (Signed)
Last office visit 05/13/2018 for HTN, Low Back Pain & Anxiety.  Last refilled 02/16/2018 for #30 with no refills.  No future appointments.

## 2018-07-04 NOTE — Telephone Encounter (Signed)
Sent. Thanks.   

## 2018-07-17 ENCOUNTER — Telehealth: Payer: Self-pay | Admitting: Family Medicine

## 2018-07-17 NOTE — Telephone Encounter (Signed)
Patient requesting [phone] testing due to spouse works for [employer] which has been closed down beginning today due several positive cases within the Judges chambers and courtrooms.He denies all symptoms of Covid19 but request testing for himself and his wife. Informed patient a virtual visit may be required before a test can be ordered. Please reach out to patient regarding appointment.

## 2018-07-18 ENCOUNTER — Encounter: Payer: Self-pay | Admitting: Family Medicine

## 2018-07-18 ENCOUNTER — Ambulatory Visit (INDEPENDENT_AMBULATORY_CARE_PROVIDER_SITE_OTHER): Payer: 59 | Admitting: Family Medicine

## 2018-07-18 ENCOUNTER — Other Ambulatory Visit: Payer: 59

## 2018-07-18 ENCOUNTER — Telehealth: Payer: Self-pay | Admitting: *Deleted

## 2018-07-18 ENCOUNTER — Other Ambulatory Visit: Payer: Self-pay

## 2018-07-18 DIAGNOSIS — Z20822 Contact with and (suspected) exposure to covid-19: Secondary | ICD-10-CM

## 2018-07-18 DIAGNOSIS — Z20828 Contact with and (suspected) exposure to other viral communicable diseases: Secondary | ICD-10-CM

## 2018-07-18 NOTE — Assessment & Plan Note (Signed)
High risk for complications with close family member with exposure to known covid positive patient

## 2018-07-18 NOTE — Progress Notes (Signed)
VIRTUAL VISIT Due to national recommendations of social distancing due to [place] 19, a virtual visit is felt to be most appropriate for this patient at this time.   I connected with the patient on 07/18/18 at 10:20 AM EDT by virtual telehealth platform and verified that I am speaking with the correct person using two identifiers.   I discussed the limitations, risks, security and privacy concerns of performing an evaluation and management service by  virtual telehealth platform and the availability of in person appointments. I also discussed with the patient that there may be a patient responsible charge related to this service. The patient expressed understanding and agreed to proceed.  Patient location: Home Provider Location: [facility] [place] Participants: Eliezer Lofts and Clelia Croft   Chief Complaint  Patient presents with  . Possible Covid Exposure    Wife works at [employer]    History of Present Illness:  63 year old male pt  of Dr. Josefine Class risk level 4 for [id number] complications presents with known exposure to [place].   He reports his wife works at [employer] in [place].. they have colosed courthouse for several positive cases. Does not know if she has been in close contact.  Wife is symptomatic. No personal cough, congestion, no fever, no emesis, no loss of taste or smell.  The importance of social distancing was discussed today.   Review of Systems  Constitutional: Negative for chills and fever.  HENT: Negative for congestion and ear pain.   Eyes: Negative for pain and redness.  Respiratory: Negative for cough and shortness of breath.   Cardiovascular: Negative for chest pain, palpitations and leg swelling.  Gastrointestinal: Negative for abdominal pain, blood in stool, constipation, diarrhea, nausea and vomiting.  Genitourinary: Negative for dysuria.  Musculoskeletal: Negative for falls and myalgias.  Skin: Negative for rash.  Neurological:  Negative for dizziness.  Psychiatric/Behavioral: Negative for depression. The patient is not nervous/anxious.       Past Medical History:  Diagnosis Date  . Aortic dissection ([place])   . Cardiac tamponade 2013  . Colon polyps 2017  . CVA (cerebral infarction)   . Hyperglycemia    a. A1C 5.9 in Sept 2013.  Marland Kitchen Hypertension   . PE (pulmonary embolism)   . RBBB   . Renal insufficiency 08/28/2012  . Sleep apnea    history of  . Stroke [facility])     reports that he quit smoking about 28 years ago. His smoking use included cigarettes. He has a 25.00 pack-year smoking history. He quit smokeless tobacco use about 6 years ago.  His smokeless tobacco use included snuff. He reports current alcohol use of about 6.0 standard drinks of alcohol per week. He reports that he does not use drugs.   Current Outpatient Medications:  .  ALPRAZolam (XANAX) 0.5 MG tablet, TAKE 1 TABLET BY MOUTH TWICE DAILY AS NEEDED FOR ANXIETY, Disp: 30 tablet, Rfl: 0 .  amLODipine (NORVASC) 5 MG tablet, Take 1 tablet (5 mg total) by mouth daily., Disp: 90 tablet, Rfl: 3 .  calcium carbonate (OS-CAL - DOSED IN MG OF ELEMENTAL CALCIUM) 1250 (500 Ca) MG tablet, Take 1 tablet by mouth daily with breakfast., Disp: , Rfl:  .  chlorthalidone (HYGROTON) 25 MG tablet, Take 1 tablet (25 mg total) by mouth daily., Disp: 90 tablet, Rfl: 3 .  Cholecalciferol (VITAMIN D3) 5000 units CAPS, Take 5,000 Units by mouth daily., Disp: , Rfl:  .  clopidogrel (PLAVIX) 75 MG tablet, Take 1 tablet (  75 mg total) by mouth daily., Disp: 90 tablet, Rfl: 3 .  cyclobenzaprine (FLEXERIL) 10 MG tablet, TAKE 1/2 TO 1 TABLET BY MOUTH THREE TIMES DAILY AS NEEDED FOR MUSCLE SPASMS,( SEDATION CAUTION), Disp: , Rfl:  .  losartan (COZAAR) 100 MG tablet, Take 1 tablet (100 mg total) by mouth daily., Disp: 90 tablet, Rfl: 3 .  methocarbamol (ROBAXIN) 500 MG tablet, Take 1 tablet by mouth daily as needed for spasms., Disp: , Rfl: 0 .  metoprolol tartrate (LOPRESSOR) 50 MG  tablet, TAKE 1 TABLET BY MOUTH TWICE DAILY, Disp: 180 tablet, Rfl: 0 .  Multiple Vitamin (MULTIVITAMIN WITH MINERALS) TABS tablet, Take 1 tablet by mouth 2 (two) times daily. , Disp: , Rfl:  .  potassium chloride (K-DUR) 10 MEQ tablet, Take 1 tablet (10 mEq total) by mouth daily., Disp: 90 tablet, Rfl: 1 .  rosuvastatin (CRESTOR) 5 MG tablet, Take 1 tablet (5 mg total) by mouth daily., Disp: 90 tablet, Rfl: 3 .  traMADol (ULTRAM) 50 MG tablet, Take 1 tablet by mouth daily as needed for pain. MAX OF 4 TABLETS PER DAY, Disp: , Rfl: 0   Observations/Objective: Blood pressure (!) 180/78, pulse (!) 58, temperature (!) 97.1 F (36.2 C), temperature source Oral, height 5\' 7"  (1.702 m), weight 218 lb (98.9 kg).  Physical Exam  Physical Exam Constitutional:      General: The patient is not in acute distress. Pulmonary:     Effort: Pulmonary effort is normal. No respiratory distress.  Neurological:     Mental Status: The patient is alert and oriented to person, place, and time.  Psychiatric:        Mood and Affect: Mood normal.        Behavior: Behavior normal.   Assessment and Plan Exposure to Covid-19 Virus High risk for complications with close family member with exposure to known covid positive patient     I discussed the assessment and treatment plan with the patient. The patient was provided an opportunity to ask questions and all were answered. The patient agreed with the plan and demonstrated an understanding of the instructions.   The patient was advised to call back or seek an in-person evaluation if the symptoms worsen or if the condition fails to improve as anticipated.     Eliezer Lofts, MD

## 2018-07-18 NOTE — Telephone Encounter (Signed)
Pt has virtual appt today at 10:20 with Dr Diona Browner.

## 2018-07-18 NOTE — Patient Instructions (Signed)
Follow BP at home.. call if > 140/90.  How to care for yourself at home:   1) Drink plenty of fluids 2) Get lots of rest 3) Wash your hands regularly - for 20 seconds 4) Cover your mouth when you cough -- ideally cough into your elbow 5) Take tylenol - can do up to 1000 mg every 8 hours (or do smaller doses more frequently) - Avoid Ibuprofen if able 6) Stay home - avoid contact with other people. Ideally have someone bring your groceries, etc.  7) Avoid touching your eyes, nose, mouth 8) Over the counter cold medication may be helpful  You leave home again - when ALL the following are TRUE:  1) No fever for at least 72 hours (without taking any medication) 2) Other symptoms have improved - no longer with cough or shortness of breath 3) At least 7 days since you got sick  Call the clinic immediately or consider going to the emergency room if:  1) Trouble Breathing 2) Persistent pain or pressure in the chest 3) New confusion or inability to wake 4) Bluish lips or face 5) Notify them that you may have COVID-19  For close contacts (people in your home)  1) Help the person you are with stay home - grocery, pharmacy, etc 2) Help monitor their symptoms for worsening illness 3) Ideally sleep in a separate room and try to use separate bathroom if possible 4) Try to limit care giver to ONE person - all others (including pets) should avoid contact 5) Clean surfaces often and wash laundry often 6) Monitor yourself for symptoms. Make sure you contact your health care provider and avoid work as soon as you develop symptoms 7) Ideally would recommend working from home or not going to work if able.    Medications:  You may have heard about medications currently being used to treat COVID-19 - including Remdesivir and Hydroxychloroquine/Chloroquine. Currently there are no FDA approved medications to treat COVID-19 and these medicines are only being used as part of a clinical trials (still studying  the effect). They are only being used in hospitalized patients because they come with significant risks.   The only proven treatment is symptomatic care - rest, fluids, tylenol.    Below is more detailed information from the [place]  Individuals who are confirmed to have, or are being evaluated for, COVID-19 should follow the prevention steps below until a healthcare provider or local or state health department says they can return to normal activities.  Stay home except to get medical care You should restrict activities outside your home, except for getting medical care. Do not go to work, school, or public areas, and do not use public transportation or taxis.  Call ahead before visiting your doctor Before your medical appointment, call the healthcare provider and tell them that you are being evaluated for, COVID-19 infection.  Monitor your symptoms Seek prompt medical attention if your illness is worsening (e.g., difficulty breathing).   Wear a facemask You should wear a facemask that covers your nose and mouth when you are in the same room with other people and when you visit a healthcare provider.   Separate yourself from other people in your home As much as possible, you should stay in a different room from other people in your home. Also, you should use a separate bathroom, if available.  Avoid sharing household items You should not share dishes, drinking glasses, cups, eating utensils, towels, bedding, or other items  with other people in your home. After using these items, you should wash them thoroughly with soap and water.  Cover your coughs and sneezes Cover your mouth and nose with a tissue when you cough or sneeze, or you can cough or sneeze into your sleeve. Throw used tissues in a lined trash can, and immediately wash your hands with soap and water for at least 20 seconds or use an alcohol-based hand rub.  Wash your [facility] your hands often and  thoroughly with soap and water for at least 20 seconds. You can use an alcohol-based hand sanitizer if soap and water are not available and if your hands are not visibly dirty. Avoid touching your eyes, nose, and mouth with unwashed hands.   Prevention Steps for Caregivers and Household Members of Individuals Confirmed to have, or Being Evaluated for, COVID-19 Infection Being Cared for in the Home  If you live with, or provide care at home for, a person confirmed to have, or being evaluated for, COVID-19 infection please follow these guidelines to prevent infection:  Follow healthcare provider's instructions Make sure that you understand and can help the patient follow any healthcare provider instructions for all care.  Provide for the patient's basic needs You should help the patient with basic needs in the home and provide support for getting groceries, prescriptions, and other personal needs.  Monitor the patient's symptoms If they are getting sicker, call his or her medical provider.   Limit the number of people who have contact with the patient  If possible, have only one caregiver for the patient.  Other household members should stay in another home or place of residence. If this is not possible, they should stay  in another room, or be separated from the patient as much as possible. Use a separate bathroom, if available.  Restrict visitors who do not have an essential need to be in the home.  Keep older adults, very young children, and other sick people away from the patient Keep older adults, very young children, and those who have compromised immune systems or chronic health conditions away from the patient. This includes people with chronic heart, lung, or kidney conditions, diabetes, and cancer.  Ensure good ventilation Make sure that shared spaces in the home have good air flow, such as from an air conditioner or an opened window, weather permitting.  Wash your hands  often  Wash your hands often and thoroughly with soap and water for at least 20 seconds. You can use an alcohol based hand sanitizer if soap and water are not available and if your hands are not visibly dirty.  Avoid touching your eyes, nose, and mouth with unwashed hands.  Use disposable paper towels to dry your hands. If not available, use dedicated cloth towels and replace them when they become wet.  Wear a facemask and gloves  Wear a disposable facemask at all times in the room and gloves when you touch or have contact with the patient's blood, body fluids, and/or secretions or excretions, such as sweat, saliva, sputum, nasal mucus, vomit, urine, or feces.  Ensure the mask fits over your nose and mouth tightly, and do not touch it during use.  Throw out disposable facemasks and gloves after using them. Do not reuse.  Wash your hands immediately after removing your facemask and gloves.  If your personal clothing becomes contaminated, carefully remove clothing and launder. Wash your hands after handling contaminated clothing.  Place all used disposable facemasks, gloves, and  other waste in a lined container before disposing them with other household waste.  Remove gloves and wash your hands immediately after handling these items.  Do not share dishes, glasses, or other household items with the patient  Avoid sharing household items. You should not share dishes, drinking glasses, cups, eating utensils, towels, bedding, or other items with a patient who is confirmed to have, or being evaluated for, COVID-19 infection.  After the person uses these items, you should wash them thoroughly with soap and water.  Wash laundry thoroughly  Immediately remove and wash clothes or bedding that have blood, body fluids, and/or secretions or excretions, such as sweat, saliva, sputum, nasal mucus, vomit, urine, or feces, on them.  Wear gloves when handling laundry from the patient.  Read and follow  directions on labels of laundry or clothing items and detergent. In general, wash and dry with the warmest temperatures recommended on the label.  Clean all areas the individual has used often  Clean all touchable surfaces, such as counters, tabletops, doorknobs, bathroom fixtures, toilets, phones, keyboards, tablets, and bedside tables, every day. Also, clean any surfaces that may have blood, body fluids, and/or secretions or excretions on them.  Wear gloves when cleaning surfaces the patient has come in contact with.  Use a diluted bleach solution (e.g., dilute bleach with 1 part bleach and 10 parts water) or a household disinfectant with a label that says EPA-registered for coronaviruses. To make a bleach solution at home, add 1 tablespoon of bleach to 1 quart (4 cups) of water. For a larger supply, add  cup of bleach to 1 gallon (16 cups) of water.  Read labels of cleaning products and follow recommendations provided on product labels. Labels contain instructions for safe and effective use of the cleaning product including precautions you should take when applying the product, such as wearing gloves or eye protection and making sure you have good ventilation during use of the product.  Remove gloves and wash hands immediately after cleaning.  Monitor yourself for signs and symptoms of illness Caregivers and household members are considered close contacts, should monitor their health, and will be asked to limit movement outside of the home to the extent possible. Follow the monitoring steps for close contacts listed on the symptom monitoring form.

## 2018-07-18 NOTE — Telephone Encounter (Signed)
Gabriel Sanders, MD  [facility] [address]        Kingstin Heims  MRN [id number]  High risk for complications level 4, [place]  [place]  [id number]   Call to patient- appointment made and orders placed

## 2018-07-20 LAB — NOVEL CORONAVIRUS, NAA: SARS-CoV-2, NAA: NOT DETECTED

## 2018-07-31 ENCOUNTER — Other Ambulatory Visit: Payer: Self-pay | Admitting: Family Medicine

## 2018-09-23 ENCOUNTER — Other Ambulatory Visit: Payer: Self-pay | Admitting: Family Medicine

## 2018-09-24 NOTE — Telephone Encounter (Signed)
LOV 07/18/2018 with Dr Diona Browner for acute visit. Last filled on 07/04/2018 for #30 with 0 refill. No future appointments.

## 2018-09-25 ENCOUNTER — Other Ambulatory Visit: Payer: Self-pay

## 2018-09-26 NOTE — Telephone Encounter (Signed)
Okay to continue for prn use.  Sent. Thanks.

## 2018-12-16 ENCOUNTER — Other Ambulatory Visit: Payer: Self-pay | Admitting: Cardiovascular Disease

## 2019-01-08 ENCOUNTER — Other Ambulatory Visit: Payer: Self-pay

## 2019-01-08 ENCOUNTER — Other Ambulatory Visit: Payer: Self-pay | Admitting: Family Medicine

## 2019-01-08 MED ORDER — AMLODIPINE BESYLATE 5 MG PO TABS: 5.0000 mg | ORAL_TABLET | Freq: Every day | ORAL | 0 refills | Status: DC

## 2019-01-09 NOTE — Telephone Encounter (Signed)
Xanax last filled 09/26/2018 #30 and Flexeril last filled 2018... LOV 05/2018, no upcming OV scheduled...Marland Kitchen please advise

## 2019-01-10 ENCOUNTER — Other Ambulatory Visit: Payer: Self-pay | Admitting: Cardiovascular Disease

## 2019-01-11 MED ORDER — ALPRAZOLAM 0.5 MG PO TABS: 0.5000 mg | ORAL_TABLET | Freq: Two times a day (BID) | ORAL | 0 refills | Status: DC | PRN

## 2019-01-11 MED ORDER — CYCLOBENZAPRINE HCL 10 MG PO TABS: ORAL_TABLET | ORAL | 0 refills | Status: AC

## 2019-01-11 NOTE — Telephone Encounter (Signed)
Sent. Thanks.  Needs CPE scheduled.   

## 2019-01-12 ENCOUNTER — Encounter: Payer: Self-pay | Admitting: Family Medicine

## 2019-01-12 NOTE — Telephone Encounter (Signed)
Letter mailed

## 2019-01-19 ENCOUNTER — Other Ambulatory Visit: Payer: Self-pay | Admitting: *Deleted

## 2019-01-19 MED ORDER — METOPROLOL TARTRATE 50 MG PO TABS: 50.0000 mg | ORAL_TABLET | Freq: Two times a day (BID) | ORAL | 1 refills | Status: DC

## 2019-01-21 ENCOUNTER — Ambulatory Visit: Payer: 59 | Admitting: Cardiovascular Disease

## 2019-01-21 ENCOUNTER — Encounter: Payer: Self-pay | Admitting: Cardiovascular Disease

## 2019-01-21 ENCOUNTER — Other Ambulatory Visit: Payer: Self-pay

## 2019-01-21 VITALS — BP 126/82 | HR 51 | Ht 67.0 in | Wt 220.8 lb

## 2019-01-21 DIAGNOSIS — Z9889 Other specified postprocedural states: Secondary | ICD-10-CM

## 2019-01-21 DIAGNOSIS — Z8679 Personal history of other diseases of the circulatory system: Secondary | ICD-10-CM | POA: Diagnosis not present

## 2019-01-21 DIAGNOSIS — E782 Mixed hyperlipidemia: Secondary | ICD-10-CM

## 2019-01-21 DIAGNOSIS — I10 Essential (primary) hypertension: Secondary | ICD-10-CM | POA: Diagnosis not present

## 2019-01-21 LAB — BASIC METABOLIC PANEL
BUN/Creatinine Ratio: 12 (ref 10–24)
BUN: 15 mg/dL (ref 8–27)
CO2: 24 mmol/L (ref 20–29)
Calcium: 9.7 mg/dL (ref 8.6–10.2)
Chloride: 94 mmol/L — ABNORMAL LOW (ref 96–106)
Creatinine, Ser: 1.29 mg/dL — ABNORMAL HIGH (ref 0.76–1.27)
GFR calc Af Amer: 69 mL/min/{1.73_m2} (ref >59)
GFR calc non Af Amer: 59 mL/min/{1.73_m2} — ABNORMAL LOW (ref >59)
Glucose: 101 mg/dL — ABNORMAL HIGH (ref 65–99)
Potassium: 4.1 mmol/L (ref 3.5–5.2)
Sodium: 132 mmol/L — ABNORMAL LOW (ref 134–144)

## 2019-01-21 LAB — HEPATIC FUNCTION PANEL
ALT: 21 IU/L (ref 0–44)
AST: 20 IU/L (ref 0–40)
Albumin: 4.3 g/dL (ref 3.8–4.8)
Alkaline Phosphatase: 61 IU/L (ref 39–117)
Bilirubin Total: 0.5 mg/dL (ref 0.0–1.2)
Bilirubin, Direct: 0.16 mg/dL (ref 0.00–0.40)
Total Protein: 6.9 g/dL (ref 6.0–8.5)

## 2019-01-21 LAB — LIPID PANEL
Chol/HDL Ratio: 2.8 ratio (ref 0.0–5.0)
Cholesterol, Total: 177 mg/dL (ref 100–199)
HDL: 63 mg/dL (ref >39)
LDL Chol Calc (NIH): 100 mg/dL — ABNORMAL HIGH (ref 0–99)
Triglycerides: 72 mg/dL (ref 0–149)
VLDL Cholesterol Cal: 14 mg/dL (ref 5–40)

## 2019-01-21 MED ORDER — POTASSIUM CHLORIDE ER 10 MEQ PO TBCR: 10.0000 meq | EXTENDED_RELEASE_TABLET | Freq: Every day | ORAL | 3 refills | Status: DC

## 2019-01-21 MED ORDER — METOPROLOL TARTRATE 50 MG PO TABS: 50.0000 mg | ORAL_TABLET | Freq: Two times a day (BID) | ORAL | 3 refills | Status: DC

## 2019-01-21 MED ORDER — LOSARTAN POTASSIUM 100 MG PO TABS: 100.0000 mg | ORAL_TABLET | Freq: Every day | ORAL | 3 refills | Status: DC

## 2019-01-21 MED ORDER — ROSUVASTATIN CALCIUM 5 MG PO TABS: 5.0000 mg | ORAL_TABLET | Freq: Every day | ORAL | 3 refills | Status: DC

## 2019-01-21 MED ORDER — AMLODIPINE BESYLATE 5 MG PO TABS: 5.0000 mg | ORAL_TABLET | Freq: Every day | ORAL | 3 refills | Status: DC

## 2019-01-21 MED ORDER — CLOPIDOGREL BISULFATE 75 MG PO TABS: 75.0000 mg | ORAL_TABLET | Freq: Every day | ORAL | 3 refills | Status: DC

## 2019-01-21 NOTE — Progress Notes (Signed)
`    Cardiology Office Note   Date:  01/21/2019   ID:  DILAND NOVOSAD, DOB 1955/04/04, MRN [id number]  PCP:  Tonia Ghent, MD  Cardiologist:   Mertie Moores, MD   Chief Complaint  Patient presents with  . Hypertension   Problem List 1. Ascending Aortic dissection - s/p repair  2  Cryptogenic stroke 3.  Essential HTN 4. Pulmonary embolus 5 . Hyperlipidemia     Gabriel Tran is a 63 y.o. male who presents for follow-up office visit.  This 63 y.o.  gentleman is seen for a scheduled follow-up office visit. He has a past history of an aortic dissection in the setting of uncontrolled hypertension. The dissection was in the ascending aorta. He underwent emergent repair and his postop course was complicated by PEA arrest and pulmonary emboli. He has a long history of hypertension. He [facility] to [facility] with evidence of a left brain stroke with right hemiparesis. His initial blood pressure was in the range of 200/115. He was given intravenous medication to bring his blood pressure down quickly after which he was given TPA. 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  He was discharged on Plavix.  He states that he is also taking a daily aspirin . Since we last saw him he has had a TEE and a implantation of a loop recorder.  The TEE showed no evidence of any source of emboli.  The loop recorder was implanted by Dr. Lovena Le on 01/20/14 for evaluation of cryptogenic stroke.  So far there have been no arrhythmias detected. The patient states that his blood pressure is still running high frequently.  He is concerned because of his known aortic dissection which extends down to the renal artery. He states that he has not been taking his Crestor because it is too expensive. He has not been having any chest pain or shortness of breath.  He still has some clumsiness of his right hand from the previous residual  from stroke.  He is due to see his neurologist Dr. Leonie Man next month.  He will confirm with Dr. Leonie Man concerning his appropriate antiplatelet therapy for his previous stroke.   May 10, 2015:  Previous [facility] patient,     Has asc. Aortic aneurism repair - several complications related  - pulmonary emboli, cardiac tamponade,  August 21, 2016:  Gabriel Tran is seen today  No CP or dyspnea. Staying active.   Not exercising much .   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 Aneurism repair   Still eating salty foods BP is a bit high . Very little exercise   January 21, 2019: Gabriel Tran is seen today for follow-up of his ascending aortic aneurysm repair.  He also has a history of pulmonary emboli. No CP or dyspnea .  Getting some exercise .  No CVA symptoms  Has been to [facility].   Has a pinched nerve in his back.    Past Medical History:  Diagnosis Date  . Aortic dissection ([place])   . Cardiac tamponade 2013  . Colon polyps 2017  . CVA (cerebral infarction)   . Hyperglycemia    a. A1C 5.9 in Sept 2013.  Marland Kitchen Hypertension   . PE (pulmonary embolism)   . RBBB   . Renal insufficiency 08/28/2012  . Sleep apnea  history of  . Stroke [facility])     Past Surgical History:  Procedure Laterality Date  . CARPAL TUNNEL RELEASE     2004 RIGHT  . COLONOSCOPY    . INSERTION OF DIALYSIS CATHETER  11/06/2011   Procedure: INSERTION OF DIALYSIS CATHETER;  Surgeon: Ivin Poot, MD;  Location: [place];  Service: Thoracic;  Laterality: Right;  . INSERTION OF DIALYSIS CATHETER  11/13/2011   Procedure: INSERTION OF DIALYSIS CATHETER;  Surgeon: Elam Dutch, MD;  Location: [place];  Service: Vascular;  Laterality: N/A;  Ultrasound guided.  Marland Kitchen KNEE SURGERY     LEFT 1981  . LEFT HEART CATHETERIZATION WITH CORONARY ANGIOGRAM N/A 10/30/2011   Procedure: LEFT HEART CATHETERIZATION WITH CORONARY ANGIOGRAM;  Surgeon: Burnell Blanks, MD;  Location: [facility] CATH LAB;  Service: Cardiovascular;  Laterality: N/A;  . LOOP RECORDER IMPLANT N/A 01/20/2014   MDT LINQ implanted by Dr Lovena Le for cryptogenic stroke  . MEDIASTINAL EXPLORATION  11/06/2011   Procedure: MEDIASTINAL EXPLORATION;  Surgeon: Ivin Poot, MD;  Location: [place];  Service: Thoracic;  Laterality: N/A;  sternal closure; removal of wound vac  . POLYPECTOMY    . TEE WITHOUT CARDIOVERSION N/A 01/20/2014   Procedure: TRANSESOPHAGEAL ECHOCARDIOGRAM (TEE);  Surgeon: Candee Furbish, MD;  Location: [place];  Service: Cardiovascular;  Laterality: N/A;  . THORACIC AORTIC ANEURYSM REPAIR  10/30/2011   Procedure: THORACIC ASCENDING ANEURYSM REPAIR (AAA);  Surgeon: Ivin Poot, MD;  Location: [place];  Service: Open Heart Surgery;  Laterality: N/A;  Ascending aortic dissection repair, arch reconstruction, aortic valve repair  . WISDOM TOOTH EXTRACTION       Current Outpatient Medications  Medication Sig Dispense Refill  . ALPRAZolam (XANAX) 0.5 MG tablet Take 1 tablet (0.5 mg total) by mouth 2 (two) times daily as needed. for anxiety 30 tablet 0  . amLODipine (NORVASC) 5 MG tablet Take 1 tablet (5 mg total) by mouth daily. 90 tablet 3  . chlorthalidone (HYGROTON) 25 MG tablet Take 1 tablet (25 mg total) by mouth daily. 90 tablet 3  . Cholecalciferol (VITAMIN D3) 5000 units CAPS Take 5,000 Units by mouth daily.    . clopidogrel (PLAVIX) 75 MG tablet Take 1 tablet (75 mg total) by mouth daily. 90 tablet 3  . cyclobenzaprine (FLEXERIL) 10 MG tablet TAKE 1/2 TO 1 TABLET BY MOUTH THREE TIMES DAILY AS NEEDED FOR MUSCLE SPASMS,( SEDATION CAUTION) 30 tablet 0  . losartan (COZAAR) 100 MG tablet Take 1 tablet (100 mg total) by mouth daily. 90 tablet 3  . methocarbamol (ROBAXIN) 500 MG tablet Take 1 tablet by mouth daily as needed for spasms.  0  . metoprolol tartrate (LOPRESSOR) 50 MG tablet Take 1 tablet (50 mg total) by mouth 2 (two) times daily. 180 tablet 3  . Multiple  Vitamin (MULTIVITAMIN WITH MINERALS) TABS tablet Take 1 tablet by mouth 2 (two) times daily.     . potassium chloride (KLOR-CON) 10 MEQ tablet Take 1 tablet (10 mEq total) by mouth daily. 90 tablet 3  . rosuvastatin (CRESTOR) 5 MG tablet Take 1 tablet (5 mg total) by mouth daily. 90 tablet 3  . traMADol (ULTRAM) 50 MG tablet Take 1 tablet by mouth daily as needed for pain. MAX OF 4 TABLETS PER DAY  0   No current facility-administered medications for this visit.    Allergies:   Carvedilol and Lipitor [atorvastatin]    Social History:  The patient  reports that he quit smoking about  28 years ago. His smoking use included cigarettes. He has a 25.00 pack-year smoking history. He quit smokeless tobacco use about 7 years ago.  His smokeless tobacco use included snuff. He reports current alcohol use of about 6.0 standard drinks of alcohol per week. He reports that he does not use drugs.   Family History:  The patient's family history includes Colon polyps in his brother; Heart disease in his brother and father; Hyperlipidemia in his brother, brother, father, and mother; Hypertension in his brother, brother, and father; Kidney disease in his brother; Leukemia in his father; Parkinsonism in his mother.    ROS:  Please see the history of present illness.   Otherwise, review of systems are positive for none.   All other systems are reviewed and negative.   Physical Exam: Blood pressure 126/82, pulse (!) 51, height 5\' 7"  (1.702 m), weight 220 lb 12.8 oz (100.2 kg), SpO2 98 %.  GEN:  Well nourished, well developed in no acute distress HEENT: Normal NECK: No JVD; No carotid bruits LYMPHATICS: No lymphadenopathy CARDIAC: RRR , musical S2 ( higher pitched musical tone)  RESPIRATORY:  Clear to auscultation without rales, wheezing or rhonchi  ABDOMEN: Soft, non-tender, non-distended MUSCULOSKELETAL:  No edema; No deformity  SKIN: Warm and dry NEUROLOGIC:  Alert and oriented x 3   EKG: Sinus  bradycardia 51 beats minute.  Incomplete right bundle branch block.  Nonspecific ST and T wave changes.  Recent Labs: No results found for requested labs within last 8760 hours.    Lipid Panel    Component Value Date/Time   CHOL 164 12/20/2017 1631   TRIG 43 12/20/2017 1631   HDL 77 12/20/2017 1631   CHOLHDL 2.1 12/20/2017 1631   CHOLHDL 2.1 08/15/2015 0818   VLDL 14 08/15/2015 0818   LDLCALC 78 12/20/2017 1631   LDLDIRECT 142.8 08/31/2011 0835      Wt Readings from Last 3 Encounters:  01/21/19 220 lb 12.8 oz (100.2 kg)  07/18/18 218 lb (98.9 kg)  12/20/17 220 lb (99.8 kg)      Other studies Reviewed: Additional studies/ records that were reviewed today include: Discharge summary from [facility] on [facility]15 Review of the above records demonstrates: Discharged on Plavix.   ASSESSMENT AND PLAN:  1.   left brain stroke felt to be probably embolic , successfully treated with TPA.   No further CVA symptoms    2. Essential hypertension, - BP is well controlled.   3. Prior history of a ascending aortic root dissection with emergent surgery .  Known descending aortic dissection.        4. Prior history of PEA arrest and pulmonary emboli postoperatively   5. Hypercholesterolemia, -    Check labs today   6. Depression    Current medicines are reviewed at length with the patient today.  The patient does not have concerns regarding medicines.   Labs/ tests ordered today include: Lipid panel hepatic function panel and basal metabolic panel   Orders Placed This Encounter  Procedures  . Lipid Profile  . Basic Metabolic Panel (BMET)  . Hepatic function panel  . EKG 12-Lead      Mertie Moores, MD  01/21/2019 9:48 AM    [facility] [place], [place], [place]  [address] Phone: [phone]; Fax: [phone]

## 2019-01-21 NOTE — Patient Instructions (Signed)
Medication Instructions:  Your physician recommends that you continue on your current medications as directed. Please refer to the Current Medication list given to you today.  *If you need a refill on your cardiac medications before your next appointment, please call your pharmacy*  Lab Work: TODAY - cholesterol, liver panel, basic metabolic panel If you have labs (blood work) drawn today and your tests are completely normal, you will receive your results only by: . MyChart Message (if you have MyChart) OR . A paper copy in the mail If you have any lab test that is abnormal or we need to change your treatment, we will call you to review the results.   Testing/Procedures: None Ordered   Follow-Up: At [facility], you and your health needs are our priority.  As part of our continuing mission to provide you with exceptional heart care, we have created designated Provider Care Teams.  These Care Teams include your primary Cardiologist (physician) and Advanced Practice Providers (APPs -  Physician Assistants and Nurse Practitioners) who all work together to provide you with the care you need, when you need it.  Your next appointment:   1 year(s)  The format for your next appointment:   In Person  Provider:   You may see Philip Nahser, MD or one of the following Advanced Practice Providers on your designated Care Team:    Scott Weaver, PA-C  Vin Bhagat, PA-C  Janine Hammond, NP    

## 2019-02-24 ENCOUNTER — Other Ambulatory Visit: Payer: Self-pay | Admitting: Cardiovascular Disease

## 2019-04-16 ENCOUNTER — Other Ambulatory Visit: Payer: Self-pay | Admitting: *Deleted

## 2019-04-16 NOTE — Telephone Encounter (Signed)
Faxed refill request. Alprazolam Last office visit:   07/18/2018 Virtual with Diona Browner Last Filled:    30 tablet 0 01/11/2019   Please advise.

## 2019-04-17 MED ORDER — ALPRAZOLAM 0.5 MG PO TABS: 0.5000 mg | ORAL_TABLET | Freq: Two times a day (BID) | ORAL | 0 refills | Status: DC | PRN

## 2019-04-17 NOTE — Telephone Encounter (Signed)
Sent. Thanks.   

## 2019-04-30 ENCOUNTER — Ambulatory Visit: Payer: 59

## 2019-05-11 ENCOUNTER — Other Ambulatory Visit: Payer: Self-pay

## 2019-05-11 MED ORDER — ROSUVASTATIN CALCIUM 5 MG PO TABS: 5.0000 mg | ORAL_TABLET | Freq: Every day | ORAL | 2 refills | Status: DC

## 2019-07-14 ENCOUNTER — Other Ambulatory Visit: Payer: Self-pay | Admitting: Family Medicine

## 2019-07-14 NOTE — Telephone Encounter (Signed)
Last office visit 07/18/2018 with Dr. Diona Browner for exposure to Covid.  Last refilled 04/17/2019 for #30 with no refills.  No UDS/Contract on file.  No future appointments.

## 2019-07-15 NOTE — Telephone Encounter (Signed)
Sent.  Needs CPE scheduled when possible.  Thanks.

## 2019-07-23 ENCOUNTER — Telehealth: Payer: Self-pay | Admitting: Family Medicine

## 2019-07-23 NOTE — Telephone Encounter (Signed)
Called and got patient scheduled for CPE and labs.

## 2019-08-26 ENCOUNTER — Other Ambulatory Visit: Payer: Self-pay | Admitting: Family Medicine

## 2019-08-26 DIAGNOSIS — I10 Essential (primary) hypertension: Secondary | ICD-10-CM

## 2019-08-31 ENCOUNTER — Other Ambulatory Visit (INDEPENDENT_AMBULATORY_CARE_PROVIDER_SITE_OTHER): Payer: 59

## 2019-08-31 ENCOUNTER — Other Ambulatory Visit: Payer: Self-pay

## 2019-08-31 DIAGNOSIS — I10 Essential (primary) hypertension: Secondary | ICD-10-CM

## 2019-08-31 LAB — CBC WITH DIFFERENTIAL/PLATELET
Basophils Absolute: 0 10*3/uL (ref 0.0–0.1)
Basophils Relative: 0.5 % (ref 0.0–3.0)
Eosinophils Absolute: 0.2 10*3/uL (ref 0.0–0.7)
Eosinophils Relative: 2.8 % (ref 0.0–5.0)
HCT: 42.3 % (ref 39.0–52.0)
Hemoglobin: 14.3 g/dL (ref 13.0–17.0)
Lymphocytes Relative: 21.1 % (ref 12.0–46.0)
Lymphs Abs: 1.5 10*3/uL (ref 0.7–4.0)
MCHC: 33.9 g/dL (ref 30.0–36.0)
MCV: 92 fl (ref 78.0–100.0)
Monocytes Absolute: 0.7 10*3/uL (ref 0.1–1.0)
Monocytes Relative: 10.5 % (ref 3.0–12.0)
Neutro Abs: 4.5 10*3/uL (ref 1.4–7.7)
Neutrophils Relative %: 65.1 % (ref 43.0–77.0)
Platelets: 164 10*3/uL (ref 150.0–400.0)
RBC: 4.59 Mil/uL (ref 4.22–5.81)
RDW: 13.6 % (ref 11.5–15.5)
WBC: 6.9 10*3/uL (ref 4.0–10.5)

## 2019-08-31 LAB — COMPREHENSIVE METABOLIC PANEL
ALT: 19 U/L (ref 0–53)
AST: 17 U/L (ref 0–37)
Albumin: 4.1 g/dL (ref 3.5–5.2)
Alkaline Phosphatase: 46 U/L (ref 39–117)
BUN: 18 mg/dL (ref 6–23)
CO2: 30 mEq/L (ref 19–32)
Calcium: 9.9 mg/dL (ref 8.4–10.5)
Chloride: 100 mEq/L (ref 96–112)
Creatinine, Ser: 1.32 mg/dL (ref 0.40–1.50)
GFR: 54.98 mL/min — ABNORMAL LOW (ref >60.00)
Glucose, Bld: 126 mg/dL — ABNORMAL HIGH (ref 70–99)
Potassium: 4.1 mEq/L (ref 3.5–5.1)
Sodium: 136 mEq/L (ref 135–145)
Total Bilirubin: 0.7 mg/dL (ref 0.2–1.2)
Total Protein: 6.7 g/dL (ref 6.0–8.3)

## 2019-08-31 LAB — LIPID PANEL
Cholesterol: 173 mg/dL (ref 0–200)
HDL: 58.8 mg/dL (ref >39.00)
LDL Cholesterol: 91 mg/dL (ref 0–99)
NonHDL: 113.71
Total CHOL/HDL Ratio: 3
Triglycerides: 113 mg/dL (ref 0.0–149.0)
VLDL: 22.6 mg/dL (ref 0.0–40.0)

## 2019-08-31 LAB — TSH: TSH: 0.92 u[IU]/mL (ref 0.35–4.50)

## 2019-09-01 ENCOUNTER — Encounter: Payer: Self-pay | Admitting: Family Medicine

## 2019-09-01 ENCOUNTER — Ambulatory Visit (INDEPENDENT_AMBULATORY_CARE_PROVIDER_SITE_OTHER): Payer: 59 | Admitting: Family Medicine

## 2019-09-01 VITALS — BP 130/82 | HR 56 | Temp 97.3°F | Ht 67.0 in | Wt 221.6 lb

## 2019-09-01 DIAGNOSIS — Z0000 Encounter for general adult medical examination without abnormal findings: Secondary | ICD-10-CM

## 2019-09-01 DIAGNOSIS — I10 Essential (primary) hypertension: Secondary | ICD-10-CM

## 2019-09-01 DIAGNOSIS — E785 Hyperlipidemia, unspecified: Secondary | ICD-10-CM

## 2019-09-01 DIAGNOSIS — R739 Hyperglycemia, unspecified: Secondary | ICD-10-CM

## 2019-09-01 DIAGNOSIS — Z7189 Other specified counseling: Secondary | ICD-10-CM

## 2019-09-01 NOTE — Progress Notes (Addendum)
This visit occurred during the SARS-CoV-2 public health emergency.  Safety protocols were in place, including screening questions prior to the visit, additional usage of staff PPE, and extensive cleaning of exam room while observing appropriate contact time as indicated for disinfecting solutions.  CPE- See plan.  Routine anticipatory guidance given to patient.  See health maintenance.  The possibility exists that previously documented standard health maintenance information may have been brought forward from a previous encounter into this note.  If needed, that same information has been updated to reflect the current situation based on today's encounter.    Tetanus 2016 [id number] Shingles 2019 PNA2018 covid vaccine 2021 Colonoscopy 2017 Prostate cancer screening and PSA options (with potential risks and benefits of testing vs not testing) were discussed along with recent recs/guidelines. He declined testing PSA at this point. Living will d/w pt. Wife designated if patient were incapacitated.   Sugar elevation d/w pt. He had some food in the night prior to getting labs done.  Discussed with patient.  Hypertension:    Using medication without problems or lightheadedness: only lightheaded with sig sweating/prolonged work outside  Chest pain with exertion:no Edema:no Short of breath: some with leaning over, he attributed to abd compression.   No heart racing with sig work in the yard but with occ sensation of a skipped beat the day after.  No sx in the meantime.  No syncope.  Labs d/w pt.    Elevated Cholesterol: Using medications without problems: yes Muscle aches: no Diet compliance: yes Exercise: encouraged.   Rare use of tramadol prn for joint pain/back pain.    PMH and SH reviewed  Meds, vitals, and allergies reviewed.   ROS: Per HPI.  Unless specifically indicated otherwise in HPI, the patient denies:  General: fever. Eyes: acute vision changes ENT: sore  throat Cardiovascular: chest pain Respiratory: SOB GI: vomiting GU: dysuria Musculoskeletal: acute back pain Derm: acute rash Neuro: acute motor dysfunction Psych: worsening mood Endocrine: polydipsia Heme: bleeding Allergy: hayfever  GEN: nad, alert and oriented HEENT: NCAT, small area of actinic keratoses noted on the scalp, treated with liquid nitrogen x3 successfully without complication after getting verbal informed consent from the patient.  Routine instructions for post cryotherapy discussed with patient.  Tolerated well. NECK: supple w/o LA CV: rrr. PULM: ctab, no inc wob ABD: soft, +bs EXT: no edema SKIN: no acute rash

## 2019-09-01 NOTE — Patient Instructions (Addendum)
Recheck fasting labs in about 3 months.  We'll go from there.  Keep working on diet and exercise in the meantime.  The scalp spots should heal up in the meantime.   Take care.  Glad to see you.

## 2019-09-02 NOTE — Assessment & Plan Note (Signed)
Tetanus 2016 [id number] Shingles 2019 PNA2018 covid vaccine 2021 Colonoscopy 2017 Prostate cancer screening and PSA options (with potential risks and benefits of testing vs not testing) were discussed along with recent recs/guidelines. He declined testing PSA at this point. Living will d/w pt. Wife designated if patient were incapacitated.

## 2019-09-02 NOTE — Assessment & Plan Note (Signed)
No heart racing with sig work in the yard but with occ sensation of a skipped beat the day after.  No sx in the meantime.  No syncope.  Labs d/w pt.   I would continue work on diet and exercise for gradual weight loss.  I would not change in medications at this point.  If he notices other troubles he will update me.  Continue metoprolol, losartan, Chlorthalidone, amlodipine, Plavix.

## 2019-09-02 NOTE — Assessment & Plan Note (Signed)
Living will d/w pt.  Wife designated if patient were incapacitated.   ?

## 2019-09-02 NOTE — Assessment & Plan Note (Signed)
No change in meds at this point.  Continue work on diet and exercise.  Continue Crestor.  He agrees.

## 2019-10-16 ENCOUNTER — Other Ambulatory Visit: Payer: Self-pay | Admitting: Family Medicine

## 2019-10-19 NOTE — Telephone Encounter (Signed)
Electronic refill request. Alprazolam Last office visit:   09/01/2019 Last Filled:    30 tablet 0 07/15/2019

## 2019-10-20 NOTE — Telephone Encounter (Signed)
Sent. Thanks.   

## 2019-12-02 ENCOUNTER — Other Ambulatory Visit: Payer: 59

## 2020-01-06 ENCOUNTER — Other Ambulatory Visit: Payer: Self-pay

## 2020-01-06 MED ORDER — CLOPIDOGREL BISULFATE 75 MG PO TABS: 75.0000 mg | ORAL_TABLET | Freq: Every day | ORAL | 0 refills | Status: DC

## 2020-02-04 ENCOUNTER — Other Ambulatory Visit: Payer: Self-pay | Admitting: Cardiovascular Disease

## 2020-02-22 ENCOUNTER — Other Ambulatory Visit: Payer: Self-pay

## 2020-02-22 MED ORDER — CHLORTHALIDONE 25 MG PO TABS: ORAL_TABLET | ORAL | 0 refills | Status: DC

## 2020-02-22 MED ORDER — LOSARTAN POTASSIUM 100 MG PO TABS: 100.0000 mg | ORAL_TABLET | Freq: Every day | ORAL | 0 refills | Status: DC

## 2020-02-22 MED ORDER — ROSUVASTATIN CALCIUM 5 MG PO TABS: 5.0000 mg | ORAL_TABLET | Freq: Every day | ORAL | 0 refills | Status: DC

## 2020-02-22 MED ORDER — AMLODIPINE BESYLATE 5 MG PO TABS: 5.0000 mg | ORAL_TABLET | Freq: Every day | ORAL | 0 refills | Status: DC

## 2020-02-23 ENCOUNTER — Other Ambulatory Visit: Payer: Self-pay | Admitting: Family Medicine

## 2020-02-23 NOTE — Telephone Encounter (Signed)
Refill request for Alprazolam 0.5 mg tabs.  LOV - 09/01/19 Next OV - Not scheduled  Last refilled - 10/20/19 #30/0

## 2020-02-24 NOTE — Telephone Encounter (Signed)
Sent. Thanks.   

## 2020-03-17 ENCOUNTER — Other Ambulatory Visit: Payer: Self-pay

## 2020-03-17 MED ORDER — POTASSIUM CHLORIDE ER 10 MEQ PO TBCR: 10.0000 meq | EXTENDED_RELEASE_TABLET | Freq: Every day | ORAL | 0 refills | Status: DC

## 2020-03-23 ENCOUNTER — Encounter: Payer: Self-pay | Admitting: Cardiovascular Disease

## 2020-03-23 ENCOUNTER — Ambulatory Visit: Payer: 59 | Admitting: Cardiovascular Disease

## 2020-03-23 ENCOUNTER — Other Ambulatory Visit: Payer: Self-pay

## 2020-03-23 VITALS — BP 126/64 | HR 60 | Ht 67.0 in | Wt 218.6 lb

## 2020-03-23 DIAGNOSIS — I10 Essential (primary) hypertension: Secondary | ICD-10-CM

## 2020-03-23 DIAGNOSIS — I7100 Dissection of unspecified site of aorta: Secondary | ICD-10-CM

## 2020-03-23 DIAGNOSIS — I712 Thoracic aortic aneurysm, without rupture, unspecified: Secondary | ICD-10-CM

## 2020-03-23 MED ORDER — LOSARTAN POTASSIUM 100 MG PO TABS: 100.0000 mg | ORAL_TABLET | Freq: Every day | ORAL | 3 refills | Status: DC

## 2020-03-23 MED ORDER — POTASSIUM CHLORIDE ER 10 MEQ PO TBCR: 10.0000 meq | EXTENDED_RELEASE_TABLET | Freq: Every day | ORAL | 3 refills | Status: DC

## 2020-03-23 MED ORDER — CHLORTHALIDONE 25 MG PO TABS: ORAL_TABLET | ORAL | 3 refills | Status: DC

## 2020-03-23 MED ORDER — METOPROLOL TARTRATE 50 MG PO TABS: 50.0000 mg | ORAL_TABLET | Freq: Two times a day (BID) | ORAL | 3 refills | Status: DC

## 2020-03-23 MED ORDER — ROSUVASTATIN CALCIUM 5 MG PO TABS: 5.0000 mg | ORAL_TABLET | Freq: Every day | ORAL | 3 refills | Status: DC

## 2020-03-23 MED ORDER — AMLODIPINE BESYLATE 5 MG PO TABS: 5.0000 mg | ORAL_TABLET | Freq: Every day | ORAL | 3 refills | Status: DC

## 2020-03-23 MED ORDER — CLOPIDOGREL BISULFATE 75 MG PO TABS: ORAL_TABLET | ORAL | 3 refills | Status: DC

## 2020-03-23 NOTE — Patient Instructions (Signed)
Medication Instructions:  Your physician recommends that you continue on your current medications as directed. Please refer to the Current Medication list given to you today.  *If you need a refill on your cardiac medications before your next appointment, please call your pharmacy*   Lab Work: TODAY: BMET, LIPIDS, ALT If you have labs (blood work) drawn today and your tests are completely normal, you will receive your results only by: Marland Kitchen MyChart Message (if you have MyChart) OR . A paper copy in the mail If you have any lab test that is abnormal or we need to change your treatment, we will call you to review the results.   Testing/Procedures: Non-Cardiac CT Angiography (CTA), is a special type of CT scan that uses a computer to produce multi-dimensional views of major blood vessels throughout the body. In CT angiography, a contrast material is injected through an IV to help visualize the blood vessels    Follow-Up: At [facility], you and your health needs are our priority.  As part of our continuing mission to provide you with exceptional heart care, we have created designated Provider Care Teams.  These Care Teams include your primary Cardiologist (physician) and Advanced Practice Providers (APPs -  Physician Assistants and Nurse Practitioners) who all work together to provide you with the care you need, when you need it.  We recommend signing up for the patient portal called "MyChart".  Sign up information is provided on this After Visit Summary.  MyChart is used to connect with patients for Virtual Visits (Telemedicine).  Patients are able to view lab/test results, encounter notes, upcoming appointments, etc.  Non-urgent messages can be sent to your provider as well.   To learn more about what you can do with MyChart, go to [email or web address].    Your next appointment:   1 year(s)  The format for your next appointment:   In Person  Provider:   You may see Mertie Moores, MD  or one of the following Advanced Practice Providers on your designated Care Team:    Richardson Dopp, PA-C  [place], [place]

## 2020-03-23 NOTE — Progress Notes (Signed)
`    Cardiology Office Note   Date:  03/23/2020   ID:  Gabriel Tran, DOB Apr 30, 1955, MRN [id number]  PCP:  Tonia Ghent, MD  Cardiologist:   Mertie Moores, MD   No chief complaint on file.  Problem List 1. Ascending Aortic dissection - s/p repair  2  Cryptogenic stroke 3.  Essential HTN 4. Pulmonary embolus 5 . Hyperlipidemia     Gabriel Tran is a 65 y.o. male who presents for follow-up office visit.  This 65 y.o.  gentleman is seen for a scheduled follow-up office visit. He has a past history of an aortic dissection in the setting of uncontrolled hypertension. The dissection was in the ascending aorta. He underwent emergent repair and his postop course was complicated by PEA arrest and pulmonary emboli. He has a long history of hypertension. He presented to [facility] on [facility]/13/14 with evidence of a left brain stroke with right hemiparesis. His initial blood pressure was in the range of 200/115. He was given intravenous medication to bring his blood pressure down quickly after which he was given TPA. 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  He was discharged on Plavix.  He states that he is also taking a daily aspirin . Since we last saw him he has had a TEE and a implantation of a loop recorder.  The TEE showed no evidence of any source of emboli.  The loop recorder was implanted by Dr. Lovena Le on 01/20/14 for evaluation of cryptogenic stroke.  So far there have been no arrhythmias detected. The patient states that his blood pressure is still running high frequently.  He is concerned because of his known aortic dissection which extends down to the renal artery. He states that he has not been taking his Crestor because it is too expensive. He has not been having any chest pain or shortness of breath.  He still has some clumsiness of his right hand from the previous residual from stroke.  He is due to see  his neurologist Dr. Leonie Man next month.  He will confirm with Dr. Leonie Man concerning his appropriate antiplatelet therapy for his previous stroke.   May 10, 2015:  Previous [facility] patient,     Has asc. Aortic aneurism repair - several complications related  - pulmonary emboli, cardiac tamponade,  August 21, 2016:  Gabriel Tran is seen today  No CP or dyspnea. Staying active.   Not exercising much .   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 Aneurism repair   Still eating salty foods BP is a bit high . Very little exercise   January 21, 2019: Gabriel Tran is seen today for follow-up of his ascending aortic aneurysm repair.  He also has a history of pulmonary emboli. No CP or dyspnea .  Getting some exercise .  No CVA symptoms  Has been to [facility].   Has a pinched nerve in his back.   March 23, 2020: Gabriel Tran is seen today for follow-up of his ascending aortic aneurysm repair.  He has a history of pulmonary emboli. Is not exercising regularly - has a treadmill at home   Past Medical History:  Diagnosis Date  . Aortic dissection ([place])   . Cardiac tamponade 2013  . Colon polyps 2017  . CVA (cerebral infarction)   . Hyperglycemia    a.  A1C 5.9 in Sept 2013.  Marland Kitchen Hypertension   . PE (pulmonary embolism)   . RBBB   . Renal insufficiency 08/28/2012  . Sleep apnea    history of  . Stroke [facility])     Past Surgical History:  Procedure Laterality Date  . CARPAL TUNNEL RELEASE     2004 RIGHT  . COLONOSCOPY    . INSERTION OF DIALYSIS CATHETER  11/06/2011   Procedure: INSERTION OF DIALYSIS CATHETER;  Surgeon: Ivin Poot, MD;  Location: [place];  Service: Thoracic;  Laterality: Right;  . INSERTION OF DIALYSIS CATHETER  11/13/2011   Procedure: INSERTION OF DIALYSIS CATHETER;  Surgeon: Elam Dutch, MD;  Location: [place];  Service: Vascular;  Laterality: N/A;  Ultrasound guided.  Marland Kitchen KNEE SURGERY     LEFT 1981   . LEFT HEART CATHETERIZATION WITH CORONARY ANGIOGRAM N/A 10/30/2011   Procedure: LEFT HEART CATHETERIZATION WITH CORONARY ANGIOGRAM;  Surgeon: Burnell Blanks, MD;  Location: [facility] CATH LAB;  Service: Cardiovascular;  Laterality: N/A;  . LOOP RECORDER IMPLANT N/A 01/20/2014   MDT LINQ implanted by Dr Lovena Le for cryptogenic stroke  . MEDIASTINAL EXPLORATION  11/06/2011   Procedure: MEDIASTINAL EXPLORATION;  Surgeon: Ivin Poot, MD;  Location: [place];  Service: Thoracic;  Laterality: N/A;  sternal closure; removal of wound vac  . POLYPECTOMY    . TEE WITHOUT CARDIOVERSION N/A 01/20/2014   Procedure: TRANSESOPHAGEAL ECHOCARDIOGRAM (TEE);  Surgeon: Candee Furbish, MD;  Location: [place];  Service: Cardiovascular;  Laterality: N/A;  . THORACIC AORTIC ANEURYSM REPAIR  10/30/2011   Procedure: THORACIC ASCENDING ANEURYSM REPAIR (AAA);  Surgeon: Ivin Poot, MD;  Location: [place];  Service: Open Heart Surgery;  Laterality: N/A;  Ascending aortic dissection repair, arch reconstruction, aortic valve repair  . WISDOM TOOTH EXTRACTION       Current Outpatient Medications  Medication Sig Dispense Refill  . ALPRAZolam (XANAX) 0.5 MG tablet TAKE 1 TABLET(0.5 MG) BY MOUTH TWICE DAILY AS NEEDED FOR ANXIETY 30 tablet 0  . Cholecalciferol (VITAMIN D3) 5000 units CAPS Take 5,000 Units by mouth daily.    . cyclobenzaprine (FLEXERIL) 10 MG tablet TAKE 1/2 TO 1 TABLET BY MOUTH THREE TIMES DAILY AS NEEDED FOR MUSCLE SPASMS,( SEDATION CAUTION) 30 tablet 0  . methocarbamol (ROBAXIN) 500 MG tablet Take 1 tablet by mouth daily as needed for spasms.  0  . Multiple Vitamin (MULTIVITAMIN WITH MINERALS) TABS tablet Take 1 tablet by mouth 2 (two) times daily.     . traMADol (ULTRAM) 50 MG tablet Take 1 tablet by mouth daily as needed for pain. MAX OF 4 TABLETS PER DAY  0  . amLODipine (NORVASC) 5 MG tablet Take 1 tablet (5 mg total) by mouth daily. 90 tablet 3  . chlorthalidone (HYGROTON) 25 MG tablet TAKE 1  TABLET(25 MG) BY MOUTH DAILY 90 tablet 3  . clopidogrel (PLAVIX) 75 MG tablet TAKE 1 TABLET(75 MG) BY MOUTH DAILY 90 tablet 3  . losartan (COZAAR) 100 MG tablet Take 1 tablet (100 mg total) by mouth daily. 90 tablet 3  . metoprolol tartrate (LOPRESSOR) 50 MG tablet Take 1 tablet (50 mg total) by mouth 2 (two) times daily. 180 tablet 3  . potassium chloride (KLOR-CON) 10 MEQ tablet Take 1 tablet (10 mEq total) by mouth daily. 90 tablet 3  . rosuvastatin (CRESTOR) 5 MG tablet Take 1 tablet (5 mg total) by mouth daily. 90 tablet 3   No current facility-administered medications for this visit.  Allergies:   Carvedilol and Lipitor [atorvastatin]    Social History:  The patient  reports that he quit smoking about 30 years ago. His smoking use included cigarettes. He has a 25.00 pack-year smoking history. He quit smokeless tobacco use about 8 years ago.  His smokeless tobacco use included snuff. He reports current alcohol use of about 6.0 standard drinks of alcohol per week. He reports that he does not use drugs.   Family History:  The patient's family history includes Colon polyps in his brother; Heart disease in his brother and father; Hyperlipidemia in his brother, brother, father, and mother; Hypertension in his brother, brother, and father; Kidney disease in his brother; Leukemia in his father; Parkinsonism in his mother.    ROS:  Please see the history of present illness.   Otherwise, review of systems are positive for none.   All other systems are reviewed and negative.   Physical Exam: Blood pressure 126/64, pulse 60, height 5\' 7"  (1.702 m), weight 218 lb 9.6 oz (99.2 kg), SpO2 97 %.  GEN:  Well nourished, well developed in no acute distress HEENT: Normal NECK: No JVD; No carotid bruits LYMPHATICS: No lymphadenopathy CARDIAC: RRR, high pitched S2 ( unchanged from previous exam)  RESPIRATORY:  Clear to auscultation without rales, wheezing or rhonchi  ABDOMEN: Soft, non-tender,  non-distended MUSCULOSKELETAL:  No edema; No deformity  SKIN: Warm and dry NEUROLOGIC:  Alert and oriented x 3   EKG:   Recent Labs: 08/31/2019: ALT 19; BUN 18; Creatinine, Ser 1.32; Hemoglobin 14.3; Platelets 164.0; Potassium 4.1; Sodium 136; TSH 0.92    Lipid Panel    Component Value Date/Time   CHOL 173 08/31/2019 0847   CHOL 177 01/21/2019 0954   TRIG 113.0 08/31/2019 0847   HDL 58.80 08/31/2019 0847   HDL 63 01/21/2019 0954   CHOLHDL 3 08/31/2019 0847   VLDL 22.6 08/31/2019 0847   LDLCALC 91 08/31/2019 0847   LDLCALC 100 (H) 01/21/2019 0954   LDLDIRECT 142.8 08/31/2011 0835      Wt Readings from Last 3 Encounters:  03/23/20 218 lb 9.6 oz (99.2 kg)  09/01/19 (!) 221 lb 9 oz (100.5 kg)  01/21/19 220 lb 12.8 oz (100.2 kg)      Other studies Reviewed: Additional studies/ records that were reviewed today include: Discharge summary from [facility] on [facility]/16[facility]15 Review of the above records demonstrates: Discharged on Plavix.   ASSESSMENT AND PLAN:  1.   left brain stroke felt to be probably embolic , successfully treated with TPA.    Stable   2. Essential hypertension, -  BP is well controlled.  Cont current meds.   3. Prior history of a ascending aortic root dissection with emergent surgery .  Known descending aortic dissection. He has previously been followed by Dr. Prescott Gum.   Will see if we can have one of Dr. Lucianne Lei Trigt's partners see him periodically in Dr. Lucianne Lei Trigt's retirement         4. Prior history of PEA arrest and pulmonary emboli postoperatively   5. Hypercholesterolemia, -     Check lipids today   6. Depression    Current medicines are reviewed at length with the patient today.  The patient does not have concerns regarding medicines.   Labs/ tests ordered today include: Lipid panel hepatic function panel and basal metabolic panel   Orders Placed This Encounter  Procedures  . CT ANGIO CHEST AORTA W/CM & OR WO/CM  . Basic metabolic panel   .  ALT  . Lipid panel      Mertie Moores, MD  03/23/2020 5:55 PM    [facility] [address], [place], [place]  [address] Phone: [phone]; Fax: [phone]

## 2020-03-24 LAB — LIPID PANEL
Chol/HDL Ratio: 2.9 ratio (ref 0.0–5.0)
Cholesterol, Total: 182 mg/dL (ref 100–199)
HDL: 62 mg/dL (ref >39)
LDL Chol Calc (NIH): 103 mg/dL — ABNORMAL HIGH (ref 0–99)
Triglycerides: 91 mg/dL (ref 0–149)
VLDL Cholesterol Cal: 17 mg/dL (ref 5–40)

## 2020-03-24 LAB — BASIC METABOLIC PANEL
BUN/Creatinine Ratio: 16 (ref 10–24)
BUN: 19 mg/dL (ref 8–27)
CO2: 22 mmol/L (ref 20–29)
Calcium: 10 mg/dL (ref 8.6–10.2)
Chloride: 96 mmol/L (ref 96–106)
Creatinine, Ser: 1.2 mg/dL (ref 0.76–1.27)
GFR calc Af Amer: 74 mL/min/{1.73_m2} (ref >59)
GFR calc non Af Amer: 64 mL/min/{1.73_m2} (ref >59)
Glucose: 107 mg/dL — ABNORMAL HIGH (ref 65–99)
Potassium: 3.7 mmol/L (ref 3.5–5.2)
Sodium: 136 mmol/L (ref 134–144)

## 2020-03-24 LAB — ALT: ALT: 25 IU/L (ref 0–44)

## 2020-04-19 ENCOUNTER — Ambulatory Visit (INDEPENDENT_AMBULATORY_CARE_PROVIDER_SITE_OTHER)
Admission: RE | Admit: 2020-04-19 | Discharge: 2020-04-19 | Disposition: A | Discharge status: Home / Self Care | Payer: 59 | Source: Ambulatory Visit | Attending: Cardiovascular Disease | Admitting: Cardiovascular Disease

## 2020-04-19 ENCOUNTER — Other Ambulatory Visit: Payer: Self-pay

## 2020-04-19 DIAGNOSIS — I712 Thoracic aortic aneurysm, without rupture, unspecified: Secondary | ICD-10-CM

## 2020-04-19 MED ORDER — IOHEXOL 350 MG/ML SOLN
100.0000 mL | Freq: Once | INTRAVENOUS | Status: AC | PRN
  Administered 2020-04-19: 100 mL via INTRAVENOUS

## 2020-04-22 ENCOUNTER — Inpatient Hospital Stay: Admission: RE | Admit: 2020-04-22 | Payer: 59 | Source: Ambulatory Visit

## 2020-05-14 ENCOUNTER — Other Ambulatory Visit: Payer: Self-pay | Admitting: Family Medicine

## 2020-05-16 NOTE — Telephone Encounter (Signed)
Refill request for Alprazolam 0.5 mg tablets  LOV - 09/01/19 Next OV - not scheduled Last refill - 02/24/20 #30/0

## 2020-05-17 ENCOUNTER — Other Ambulatory Visit: Payer: Self-pay

## 2020-05-17 MED ORDER — ROSUVASTATIN CALCIUM 5 MG PO TABS: 5.0000 mg | ORAL_TABLET | Freq: Every day | ORAL | 3 refills | Status: DC

## 2020-05-17 MED ORDER — CHLORTHALIDONE 25 MG PO TABS: ORAL_TABLET | ORAL | 3 refills | Status: DC

## 2020-05-17 MED ORDER — LOSARTAN POTASSIUM 100 MG PO TABS: 100.0000 mg | ORAL_TABLET | Freq: Every day | ORAL | 3 refills | Status: DC

## 2020-05-17 NOTE — Telephone Encounter (Signed)
Sent. Thanks.   

## 2020-06-06 DIAGNOSIS — Z0289 Encounter for other administrative examinations: Secondary | ICD-10-CM

## 2020-06-09 ENCOUNTER — Other Ambulatory Visit: Payer: Self-pay

## 2020-06-09 MED ORDER — POTASSIUM CHLORIDE ER 10 MEQ PO TBCR: 10.0000 meq | EXTENDED_RELEASE_TABLET | Freq: Every day | ORAL | 3 refills | Status: DC

## 2020-09-14 ENCOUNTER — Encounter: Payer: Self-pay | Admitting: Gastroenterology

## 2021-03-08 ENCOUNTER — Encounter: Payer: Self-pay | Admitting: Family Medicine

## 2021-03-09 ENCOUNTER — Telehealth (INDEPENDENT_AMBULATORY_CARE_PROVIDER_SITE_OTHER): Payer: BC Managed Care – PPO | Admitting: Nurse Practitioner

## 2021-03-09 ENCOUNTER — Encounter: Payer: Self-pay | Admitting: Nurse Practitioner

## 2021-03-09 DIAGNOSIS — R051 Acute cough: Secondary | ICD-10-CM

## 2021-03-09 DIAGNOSIS — U071 COVID-19: Secondary | ICD-10-CM | POA: Insufficient documentation

## 2021-03-09 MED ORDER — BENZONATATE 100 MG PO CAPS: 100.0000 mg | ORAL_CAPSULE | Freq: Three times a day (TID) | ORAL | 0 refills | Status: AC | PRN

## 2021-03-09 MED ORDER — MOLNUPIRAVIR EUA 200MG CAPSULE: 4.0000 | ORAL_CAPSULE | Freq: Two times a day (BID) | ORAL | 0 refills | Status: AC

## 2021-03-09 NOTE — Assessment & Plan Note (Signed)
Planes of cough.  We will send some Tessalon Perles 100 mg 3 times daily as needed cough.

## 2021-03-09 NOTE — Progress Notes (Signed)
Patient ID: Gabriel Tran, male    DOB: 07-28-1955, 66 y.o.   MRN: [id number]  Virtual visit completed through [employer], a video enabled telemedicine application. Due to national recommendations of social distancing due to COVID-19, a virtual visit is felt to be most appropriate for this patient at this time. Reviewed limitations, risks, security and privacy concerns of performing a virtual visit and the availability of in person appointments. I also reviewed that there may be a patient responsible charge related to this service. The patient agreed to proceed.   Patient location: home Provider location: [place] at [facility], office Persons participating in this virtual visit: patient, provider   If any vitals were documented, they were collected by patient at home unless specified below.     99% O2 59 pulse   CC: Covid 19 Subjective:   HPI: Gabriel Tran is a 67 y.o. male presenting on 03/09/2021 for Covid Positive (On 03/08/21, sx started on 03/05/21, sx are headache, fever, body aches, nasal congestion, chest congestion, post nasal drip. No sore throat.)   Symptoms started on 03/05/2021 States covid test was last night and positive  J&J and one booster No sick contacts Has been taking Vitamin C, Zinc, Worsetin    Relevant past medical, surgical, family and social history reviewed and updated as indicated. Interim medical history since our last visit reviewed. Allergies and medications reviewed and updated. Outpatient Medications Prior to Visit  Medication Sig Dispense Refill   ALPRAZolam (XANAX) 0.5 MG tablet TAKE 1 TABLET(0.5 MG) BY MOUTH TWICE DAILY AS NEEDED FOR ANXIETY 30 tablet 1   amLODipine (NORVASC) 5 MG tablet Take 1 tablet (5 mg total) by mouth daily. 90 tablet 3   chlorthalidone (HYGROTON) 25 MG tablet TAKE 1 TABLET(25 MG) BY MOUTH DAILY 90 tablet 3   Cholecalciferol (VITAMIN D3) 50 MCG (2000 UT) TABS Take by mouth. daily     clopidogrel (PLAVIX) 75 MG  tablet TAKE 1 TABLET(75 MG) BY MOUTH DAILY 90 tablet 3   cyclobenzaprine (FLEXERIL) 10 MG tablet TAKE 1/2 TO 1 TABLET BY MOUTH THREE TIMES DAILY AS NEEDED FOR MUSCLE SPASMS,( SEDATION CAUTION) 30 tablet 0   losartan (COZAAR) 100 MG tablet Take 1 tablet (100 mg total) by mouth daily. 90 tablet 3   methocarbamol (ROBAXIN) 500 MG tablet Take 1 tablet by mouth daily as needed for spasms.  0   metoprolol tartrate (LOPRESSOR) 50 MG tablet Take 1 tablet (50 mg total) by mouth 2 (two) times daily. 180 tablet 3   Multiple Vitamin (MULTIVITAMIN WITH MINERALS) TABS tablet Take 1 tablet by mouth 2 (two) times daily.      potassium chloride (KLOR-CON) 10 MEQ tablet Take 1 tablet (10 mEq total) by mouth daily. 90 tablet 3   rosuvastatin (CRESTOR) 5 MG tablet Take 1 tablet (5 mg total) by mouth daily. 90 tablet 3   traMADol (ULTRAM) 50 MG tablet Take 1 tablet by mouth daily as needed for pain. MAX OF 4 TABLETS PER DAY  0   Cholecalciferol (VITAMIN D3) 5000 units CAPS Take 5,000 Units by mouth daily.     No facility-administered medications prior to visit.     Per HPI unless specifically indicated in ROS section below Review of Systems  Constitutional:  Positive for chills, fatigue and fever. Negative for appetite change.  HENT:  Positive for congestion, ear pain (pressure), postnasal drip, sinus pressure and sinus pain. Negative for sore throat.   Respiratory:  Positive for cough (brown/clear) and  shortness of breath (baseline but some with DOE).   Cardiovascular:  Negative for chest pain.  Gastrointestinal:  Negative for abdominal pain, diarrhea, nausea and vomiting.  Musculoskeletal:  Positive for arthralgias and myalgias.  Neurological:  Positive for headaches.  Objective:  There were no vitals taken for this visit.  Wt Readings from Last 3 Encounters:  03/23/20 218 lb 9.6 oz (99.2 kg)  09/01/19 (!) 221 lb 9 oz (100.5 kg)  01/21/19 220 lb 12.8 oz (100.2 kg)       Physical exam: Gen: alert, NAD,  not ill appearing Pulm: speaks in complete sentences without increased work of breathing Psych: normal mood, normal thought content      Results for orders placed or performed in visit on 70/62/37  Basic metabolic panel  Result Value Ref Range   Glucose 107 (H) 65 - 99 mg/dL   BUN 19 8 - 27 mg/dL   Creatinine, Ser 1.20 0.76 - 1.27 mg/dL   GFR calc non Af Amer 64 >59 mL/min/1.73   GFR calc Af Amer 74 >59 mL/min/1.73   BUN/Creatinine Ratio 16 10 - 24   Sodium 136 134 - 144 mmol/L   Potassium 3.7 3.5 - 5.2 mmol/L   Chloride 96 96 - 106 mmol/L   CO2 22 20 - 29 mmol/L   Calcium 10.0 8.6 - 10.2 mg/dL  ALT  Result Value Ref Range   ALT 25 0 - 44 IU/L  Lipid panel  Result Value Ref Range   Cholesterol, Total 182 100 - 199 mg/dL   Triglycerides 91 0 - 149 mg/dL   HDL 62 >39 mg/dL   VLDL Cholesterol Cal 17 5 - 40 mg/dL   LDL Chol Calc (NIH) 103 (H) 0 - 99 mg/dL   Chol/HDL Ratio 2.9 0.0 - 5.0 ratio   Assessment & Plan:   Problem List Items Addressed This Visit       Other   Cough    Planes of cough.  We will send some Tessalon Perles 100 mg 3 times daily as needed cough.      Relevant Medications   benzonatate (TESSALON) 100 MG capsule   COVID-19 - Primary    Is a positive for COVID-19.  Did discuss oral antiviral treatments and that they are EUA only.  After joint discussion decided to proceed with oral antiviral treatment.  Discussed common side effects of medication chosen.  Did discuss CDC guidelines in regards to quarantine and self-isolation.  Also discussed signs and symptoms when to seek urgent or emergent health care. Start molnupiravir as soon as possible      Relevant Medications   molnupiravir EUA (LAGEVRIO) 200 mg CAPS capsule     Meds ordered this encounter  Medications   molnupiravir EUA (LAGEVRIO) 200 mg CAPS capsule    Sig: Take 4 capsules (800 mg total) by mouth 2 (two) times daily for 5 days.    Dispense:  40 capsule    Refill:  0    Order Specific  Question:   Supervising Provider    Answer:   Loura Pardon A [[id number]]   benzonatate (TESSALON) 100 MG capsule    Sig: Take 1 capsule (100 mg total) by mouth 3 (three) times daily as needed for up to 7 days for cough.    Dispense:  21 capsule    Refill:  0    Order Specific Question:   Supervising Provider    Answer:   TOWER, MARNE A [[id number]]   No orders of  the defined types were placed in this encounter.   I discussed the assessment and treatment plan with the patient. The patient was provided an opportunity to ask questions and all were answered. The patient agreed with the plan and demonstrated an understanding of the instructions. The patient was advised to call back or seek an in-person evaluation if the symptoms worsen or if the condition fails to improve as anticipated.  Follow up plan: Return if symptoms worsen or fail to improve.  Romilda Garret, NP

## 2021-03-09 NOTE — Assessment & Plan Note (Signed)
Is a positive for COVID-19.  Did discuss oral antiviral treatments and that they are EUA only.  After joint discussion decided to proceed with oral antiviral treatment.  Discussed common side effects of medication chosen.  Did discuss CDC guidelines in regards to quarantine and self-isolation.  Also discussed signs and symptoms when to seek urgent or emergent health care. Start molnupiravir as soon as possible

## 2021-03-09 NOTE — Telephone Encounter (Signed)
Spoke with patient. Scheduled VV today with Romilda Garret, NP.

## 2021-03-23 ENCOUNTER — Other Ambulatory Visit: Payer: Self-pay | Admitting: *Deleted

## 2021-03-23 MED ORDER — ROSUVASTATIN CALCIUM 5 MG PO TABS: 5.0000 mg | ORAL_TABLET | Freq: Every day | ORAL | 0 refills | Status: DC

## 2021-04-18 ENCOUNTER — Telehealth: Payer: Self-pay | Admitting: Cardiovascular Disease

## 2021-04-18 MED ORDER — ROSUVASTATIN CALCIUM 5 MG PO TABS: 5.0000 mg | ORAL_TABLET | Freq: Every day | ORAL | 0 refills | Status: DC

## 2021-04-18 MED ORDER — AMLODIPINE BESYLATE 5 MG PO TABS: 5.0000 mg | ORAL_TABLET | Freq: Every day | ORAL | 0 refills | Status: DC

## 2021-04-18 MED ORDER — METOPROLOL TARTRATE 50 MG PO TABS: 50.0000 mg | ORAL_TABLET | Freq: Two times a day (BID) | ORAL | 0 refills | Status: DC

## 2021-04-18 MED ORDER — LOSARTAN POTASSIUM 100 MG PO TABS: 100.0000 mg | ORAL_TABLET | Freq: Every day | ORAL | 0 refills | Status: DC

## 2021-04-18 MED ORDER — CHLORTHALIDONE 25 MG PO TABS: ORAL_TABLET | ORAL | 0 refills | Status: DC

## 2021-04-18 MED ORDER — POTASSIUM CHLORIDE ER 10 MEQ PO TBCR: 10.0000 meq | EXTENDED_RELEASE_TABLET | Freq: Every day | ORAL | 0 refills | Status: DC

## 2021-04-18 NOTE — Telephone Encounter (Signed)
? ?*  STAT* If patient is at the pharmacy, call can be transferred to refill team. ? ? ?1. Which medications need to be refilled? (please list name of each medication and dose if known)  ? ?amLODipine (NORVASC) 5 MG tablet ?chlorthalidone (HYGROTON) 25 MG tablet ?losartan (COZAAR) 100 MG tablet ?metoprolol tartrate (LOPRESSOR) 50 MG tablet ?potassium chloride (KLOR-CON) 10 MEQ tablet ?rosuvastatin (CRESTOR) 5 MG tablet ? ? ?2. Which pharmacy/location (including street and city if local pharmacy) is medication to be sent to? ? ?[facility] # [place], [place] ? ?3. Do they need a 30 day or 90 day supply? 30  ? ?Patient has appointment on 05/22/21 ?

## 2021-04-18 NOTE — Telephone Encounter (Signed)
Chart reviewed. ? ?Rx(s) sent to pharmacy electronically. ? ?Patient notified directly and voiced understanding.  ?

## 2021-04-24 ENCOUNTER — Telehealth: Payer: Self-pay | Admitting: Cardiovascular Disease

## 2021-04-24 MED ORDER — CLOPIDOGREL BISULFATE 75 MG PO TABS: ORAL_TABLET | ORAL | 0 refills | Status: DC

## 2021-04-24 NOTE — Telephone Encounter (Signed)
Pt's medication was sent to pt's pharmacy as requested. Confirmation received.  °

## 2021-04-24 NOTE — Telephone Encounter (Signed)
?*  STAT* If patient is at the pharmacy, call can be transferred to refill team. ? ? ?1. Which medications need to be refilled? (please list name of each medication and dose if known) clopidogrel (PLAVIX) 75 MG tablet [[id number]]  ? ?2. Which pharmacy/location (including street and city if local pharmacy) is medication to be sent to? [facility] on file    ? ?3. Do they need a 30 day or 90 day supply? 90  ?

## 2021-05-15 ENCOUNTER — Other Ambulatory Visit: Payer: Self-pay | Admitting: *Deleted

## 2021-05-15 MED ORDER — METOPROLOL TARTRATE 50 MG PO TABS: 50.0000 mg | ORAL_TABLET | Freq: Two times a day (BID) | ORAL | 0 refills | Status: DC

## 2021-05-15 MED ORDER — CHLORTHALIDONE 25 MG PO TABS
ORAL_TABLET | ORAL | 0 refills | Status: DC
Start: 2021-05-15 — End: 2021-06-06

## 2021-05-15 NOTE — Addendum Note (Signed)
Addended by: Carter Kitten D on: 05/15/2021 11:34 AM ? ? Modules accepted: Orders ? ?

## 2021-05-17 ENCOUNTER — Other Ambulatory Visit: Payer: Self-pay

## 2021-05-17 MED ORDER — LOSARTAN POTASSIUM 100 MG PO TABS: 100.0000 mg | ORAL_TABLET | Freq: Every day | ORAL | 0 refills | Status: DC

## 2021-05-21 ENCOUNTER — Encounter: Payer: Self-pay | Admitting: Cardiovascular Disease

## 2021-05-21 NOTE — Progress Notes (Signed)
`  ?  ?Cardiology Office Note ? ? ?Date:  05/22/2021  ? ?ID:  Gabriel Tran, DOB 16-May-1955, MRN [id number] ? ?PCP:  Tonia Ghent, MD  ?Cardiologist:   Mertie Moores, MD  ? ?Chief Complaint  ?Patient presents with  ? Hypertension  ?   ?  ? Thoracic Aortic Dissection  ? ?Problem List ?1. Ascending Aortic dissection - s/p repair  ?2  Cryptogenic stroke ?3.  Essential HTN ?4. Pulmonary embolus ?5 . Hyperlipidemia ?  ?  ?Gabriel Tran is a 66 y.o. male who presents for follow-up office visit. ? ?This 66 y.o.  gentleman is seen for a scheduled follow-up office visit.  He has a past history of an aortic dissection in the setting of uncontrolled hypertension.  The dissection was in the ascending aorta.  He underwent emergent repair and his postop course was complicated by PEA arrest and pulmonary emboli.  He has a long history of hypertension. ?He presented to [facility] on [facility]/14 with evidence of a left brain stroke with right hemiparesis.  His initial blood pressure was in the range of 200/115.  He was given intravenous medication to bring his blood pressure down quickly after which he was given TPA.  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  He was discharged on Plavix.  He states that he is also taking a daily aspirin . ?Since we last saw him he has had a TEE and a implantation of a loop recorder.  The TEE showed no evidence of any source of emboli.  The loop recorder was implanted by Dr. Lovena Le on 01/20/14 for evaluation of cryptogenic stroke.  So far there have been no arrhythmias detected. ?The patient states that his blood pressure is still running high frequently.  He is concerned because of his known aortic dissection which extends down to the renal artery. ?He states that he has not been taking his Crestor because it is too expensive. ?He has not been having any chest pain or shortness of breath.  He still has some clumsiness of his right  hand from the previous residual from stroke.  He is due to see his neurologist Dr. Leonie Man next month.  He will confirm with Dr. Leonie Man concerning his appropriate antiplatelet therapy for his previous stroke. ? ? ?May 10, 2015: ? ?Previous [facility] patient,    ? ?Has asc. Aortic aneurism repair - several complications related  ?- pulmonary emboli, cardiac tamponade, ? ?August 21, 2016: ? ?Gabriel Tran is seen today  ?No CP or dyspnea. ?Staying active.   Not exercising much .   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 ?December 20, 2017: ?Hx of asc. Aneurism repair  ? ?Still eating salty foods ?BP is a bit high . ?Very little exercise  ? ?January 21, 2019: ?Gabriel Tran is seen today for follow-up of his ascending aortic aneurysm repair.  He also has a history of pulmonary emboli. ?No CP or dyspnea .  ?Getting some exercise .  ?No CVA symptoms  ?Has been to [facility].   Has a pinched nerve in his back.  ? ?March 23, 2020: ?Gabriel Tran is seen today for follow-up of his ascending aortic aneurysm repair.  He has a history of pulmonary emboli. ?Is not exercising regularly - has a treadmill at home  ? ?May 22, 2021 ?Gabriel Tran is seen today for follow up of his dissection  of his asc. Aorta ?S/p repair ?Hx of HTN  ?Having some DOE walking up his driveway  ( esp pulling his trashcans up the hill )  ?Is not exercising regularly  ?Had [place] in [place]  ?Still not back up to his baseline  ? ?Past Medical History:  ?Diagnosis Date  ? Aortic dissection ([place])   ? Cardiac tamponade 2013  ? Colon polyps 2017  ? CVA (cerebral infarction)   ? Hyperglycemia   ? a. A1C 5.9 in Sept 2013.  ? Hypertension   ? PE (pulmonary embolism)   ? RBBB   ? Renal insufficiency 08/28/2012  ? Sleep apnea   ? history of  ? Stroke [facility])   ? ? ?Past Surgical History:  ?Procedure Laterality Date  ? CARPAL TUNNEL RELEASE    ? 2004 RIGHT  ? COLONOSCOPY    ? INSERTION OF DIALYSIS CATHETER  11/06/2011  ? Procedure: INSERTION OF  DIALYSIS CATHETER;  Surgeon: Ivin Poot, MD;  Location: [place];  Service: Thoracic;  Laterality: Right;  ? INSERTION OF DIALYSIS CATHETER  11/13/2011  ? Procedure: INSERTION OF DIALYSIS CATHETER;  Surgeon: Elam Dutch, MD;  Location: [place];  Service: Vascular;  Laterality: N/A;  Ultrasound guided.  ? KNEE SURGERY    ? LEFT 1981  ? LEFT HEART CATHETERIZATION WITH CORONARY ANGIOGRAM N/A 10/30/2011  ? Procedure: LEFT HEART CATHETERIZATION WITH CORONARY ANGIOGRAM;  Surgeon: Burnell Blanks, MD;  Location: [facility] CATH LAB;  Service: Cardiovascular;  Laterality: N/A;  ? LOOP RECORDER IMPLANT N/A 01/20/2014  ? MDT LINQ implanted by Dr Lovena Le for cryptogenic stroke  ? MEDIASTINAL EXPLORATION  11/06/2011  ? Procedure: MEDIASTINAL EXPLORATION;  Surgeon: Ivin Poot, MD;  Location: [place];  Service: Thoracic;  Laterality: N/A;  sternal closure; removal of wound vac  ? POLYPECTOMY    ? TEE WITHOUT CARDIOVERSION N/A 01/20/2014  ? Procedure: TRANSESOPHAGEAL ECHOCARDIOGRAM (TEE);  Surgeon: Candee Furbish, MD;  Location: [place];  Service: Cardiovascular;  Laterality: N/A;  ? THORACIC AORTIC ANEURYSM REPAIR  10/30/2011  ? Procedure: THORACIC ASCENDING ANEURYSM REPAIR (AAA);  Surgeon: Ivin Poot, MD;  Location: [place];  Service: Open Heart Surgery;  Laterality: N/A;  Ascending aortic dissection repair, arch reconstruction, aortic valve repair  ? WISDOM TOOTH EXTRACTION    ? ? ? ?Current Outpatient Medications  ?Medication Sig Dispense Refill  ? ALPRAZolam (XANAX) 0.5 MG tablet TAKE 1 TABLET(0.5 MG) BY MOUTH TWICE DAILY AS NEEDED FOR ANXIETY 30 tablet 1  ? amLODipine (NORVASC) 5 MG tablet Take 1 tablet (5 mg total) by mouth daily. 30 tablet 0  ? chlorthalidone (HYGROTON) 25 MG tablet TAKE 1 TABLET(25 MG) BY MOUTH DAILY 30 tablet 0  ? Cholecalciferol (VITAMIN D3) 50 MCG (2000 UT) TABS Take by mouth. daily    ? clopidogrel (PLAVIX) 75 MG tablet TAKE 1 TABLET(75 MG) BY MOUTH DAILY. Please keep upcoming appt in April  2023 with Dr. Acie Fredrickson before anymore refills. Thank you Final Attempt 90 tablet 0  ? cyclobenzaprine (FLEXERIL) 10 MG tablet TAKE 1/2 TO 1 TABLET BY MOUTH THREE TIMES DAILY AS NEEDED FOR MUSCLE SPASMS,( SEDATION CAUTION) 30 tablet 0  ? losartan (COZAAR) 100 MG tablet Take 1 tablet (100 mg total) by mouth daily. 30 tablet 0  ? methocarbamol (ROBAXIN) 500 MG tablet Take 1 tablet by mouth daily as needed for spasms.  0  ? metoprolol tartrate (LOPRESSOR) 50 MG tablet Take 1 tablet (50 mg total) by mouth 2 (two) times daily.  60 tablet 0  ? Multiple Vitamin (MULTIVITAMIN WITH MINERALS) TABS tablet Take 1 tablet by mouth 2 (two) times daily.     ? potassium chloride (KLOR-CON) 10 MEQ tablet Take 1 tablet (10 mEq total) by mouth daily. 30 tablet 0  ? rosuvastatin (CRESTOR) 5 MG tablet Take 1 tablet (5 mg total) by mouth daily. 30 tablet 0  ? traMADol (ULTRAM) 50 MG tablet Take 1 tablet by mouth daily as needed for pain. MAX OF 4 TABLETS PER DAY  0  ? ?No current facility-administered medications for this visit.  ? ? ?Allergies:   Carvedilol and Lipitor [atorvastatin]  ? ? ?Social History:  The patient  reports that he quit smoking about 31 years ago. His smoking use included cigarettes. He has a 25.00 pack-year smoking history. He quit smokeless tobacco use about 9 years ago.  His smokeless tobacco use included snuff. He reports current alcohol use of about 6.0 standard drinks per week. He reports that he does not use drugs.  ? ?Family History:  The patient's family history includes Colon polyps in his brother; Heart disease in his brother and father; Hyperlipidemia in his brother, brother, father, and mother; Hypertension in his brother, brother, and father; Kidney disease in his brother; Leukemia in his father; Parkinsonism in his mother.  ? ? ?ROS:  Please see the history of present illness.   Otherwise, review of systems are positive for none.   All other systems are reviewed and negative.  ? ? ?Physical Exam: ?Blood  pressure 132/68, pulse (!) 56, height '5\' 7"'$  (1.702 m), weight 221 lb 3.2 oz (100.3 kg), SpO2 98 %. ? ?GEN:  Well nourished, well developed in no acute distress ?HEENT: Normal ?NECK: No JVD; No carotid bruits

## 2021-05-22 ENCOUNTER — Ambulatory Visit: Payer: BC Managed Care – PPO | Admitting: Cardiovascular Disease

## 2021-05-22 ENCOUNTER — Encounter: Payer: Self-pay | Admitting: Cardiovascular Disease

## 2021-05-22 VITALS — BP 132/68 | HR 56 | Ht 67.0 in | Wt 221.2 lb

## 2021-05-22 DIAGNOSIS — I10 Essential (primary) hypertension: Secondary | ICD-10-CM

## 2021-05-22 DIAGNOSIS — E782 Mixed hyperlipidemia: Secondary | ICD-10-CM

## 2021-05-22 NOTE — Patient Instructions (Signed)
Medication Instructions:  ?Your physician recommends that you continue on your current medications as directed. Please refer to the Current Medication list given to you today. ? ?*If you need a refill on your cardiac medications before your next appointment, please call your pharmacy* ? ?Lab Work: ?TODAY: Lipids, ALT, BMP ?If you have labs (blood work) drawn today and your tests are completely normal, you will receive your results only by: ?MyChart Message (if you have MyChart) OR ?A paper copy in the mail ?If you have any lab test that is abnormal or we need to change your treatment, we will call you to review the results. ? ?Testing/Procedures: ?NONE ? ?Follow-Up: ?At [facility], you and your health needs are our priority.  As part of our continuing mission to provide you with exceptional heart care, we have created designated Provider Care Teams.  These Care Teams include your primary Cardiologist (physician) and Advanced Practice Providers (APPs -  Physician Assistants and Nurse Practitioners) who all work together to provide you with the care you need, when you need it. ? ?Your next appointment:   ?1 year(s) ? ?The format for your next appointment:   ?In Person ? ?Provider:   ?Mertie Moores, MD  or Robbie Lis, PA-C, Christen Bame, NP, or Richardson Dopp, PA-C      ? ?Other Instructions ?Important Information About Sugar ? ? ? ? ?  ?

## 2021-05-23 ENCOUNTER — Telehealth: Payer: Self-pay

## 2021-05-23 DIAGNOSIS — Z79899 Other long term (current) drug therapy: Secondary | ICD-10-CM

## 2021-05-23 LAB — LIPID PANEL
Chol/HDL Ratio: 2.8 ratio (ref 0.0–5.0)
Cholesterol, Total: 189 mg/dL (ref 100–199)
HDL: 68 mg/dL (ref >39)
LDL Chol Calc (NIH): 97 mg/dL (ref 0–99)
Triglycerides: 137 mg/dL (ref 0–149)
VLDL Cholesterol Cal: 24 mg/dL (ref 5–40)

## 2021-05-23 LAB — ALT: ALT: 23 IU/L (ref 0–44)

## 2021-05-23 LAB — BASIC METABOLIC PANEL
BUN/Creatinine Ratio: 18 (ref 10–24)
BUN: 24 mg/dL (ref 8–27)
CO2: 27 mmol/L (ref 20–29)
Calcium: 10.3 mg/dL — ABNORMAL HIGH (ref 8.6–10.2)
Chloride: 96 mmol/L (ref 96–106)
Creatinine, Ser: 1.33 mg/dL — ABNORMAL HIGH (ref 0.76–1.27)
Glucose: 103 mg/dL — ABNORMAL HIGH (ref 70–99)
Potassium: 4.4 mmol/L (ref 3.5–5.2)
Sodium: 136 mmol/L (ref 134–144)
eGFR: 60 mL/min/{1.73_m2} (ref >59)

## 2021-05-23 MED ORDER — ROSUVASTATIN CALCIUM 10 MG PO TABS: 10.0000 mg | ORAL_TABLET | Freq: Every day | ORAL | 3 refills | Status: DC

## 2021-05-23 NOTE — Telephone Encounter (Signed)
Crestor '10mg'$  sent into [facility]  ?

## 2021-05-23 NOTE — Telephone Encounter (Signed)
-----   Message from Thayer Headings, MD sent at 05/23/2021  9:56 AM EDT ----- ?BMP is stable ?ALT is normal ?Lipids are stable ,  I would like to see his LDL lower - goal of < 70  ? ?Lets increase rosuvastatin to 10 mg a day  ?Check lipids, ALT in 3 months  ?

## 2021-05-23 NOTE — Telephone Encounter (Signed)
Spoke with patient and reviewed results. Lab appt scheduled for 7/25 for repeat labs and medication sent to [facility] per pt request ?

## 2021-05-31 ENCOUNTER — Other Ambulatory Visit: Payer: Self-pay | Admitting: Cardiovascular Disease

## 2021-06-06 ENCOUNTER — Other Ambulatory Visit: Payer: Self-pay | Admitting: *Deleted

## 2021-06-06 MED ORDER — ROSUVASTATIN CALCIUM 10 MG PO TABS: 10.0000 mg | ORAL_TABLET | Freq: Every day | ORAL | 3 refills | Status: DC

## 2021-06-06 MED ORDER — POTASSIUM CHLORIDE ER 10 MEQ PO TBCR
10.0000 meq | EXTENDED_RELEASE_TABLET | Freq: Every day | ORAL | 3 refills | Status: DC
Start: 2021-06-06 — End: 2022-07-25

## 2021-06-06 MED ORDER — CLOPIDOGREL BISULFATE 75 MG PO TABS: ORAL_TABLET | ORAL | 3 refills | Status: DC

## 2021-06-06 MED ORDER — METOPROLOL TARTRATE 50 MG PO TABS: 50.0000 mg | ORAL_TABLET | Freq: Two times a day (BID) | ORAL | 3 refills | Status: DC

## 2021-06-06 MED ORDER — LOSARTAN POTASSIUM 100 MG PO TABS: 100.0000 mg | ORAL_TABLET | Freq: Every day | ORAL | 3 refills | Status: DC

## 2021-06-06 MED ORDER — CHLORTHALIDONE 25 MG PO TABS: ORAL_TABLET | ORAL | 3 refills | Status: DC

## 2021-06-08 ENCOUNTER — Other Ambulatory Visit: Payer: Self-pay

## 2021-06-08 MED ORDER — AMLODIPINE BESYLATE 5 MG PO TABS: 5.0000 mg | ORAL_TABLET | Freq: Every day | ORAL | 3 refills | Status: DC

## 2021-08-29 ENCOUNTER — Other Ambulatory Visit: Payer: BC Managed Care – PPO

## 2021-08-29 DIAGNOSIS — Z79899 Other long term (current) drug therapy: Secondary | ICD-10-CM

## 2021-08-29 LAB — LIPID PANEL
Chol/HDL Ratio: 2.9 ratio (ref 0.0–5.0)
Cholesterol, Total: 166 mg/dL (ref 100–199)
HDL: 57 mg/dL (ref >39)
LDL Chol Calc (NIH): 91 mg/dL (ref 0–99)
Triglycerides: 101 mg/dL (ref 0–149)
VLDL Cholesterol Cal: 18 mg/dL (ref 5–40)

## 2021-08-29 LAB — HEPATIC FUNCTION PANEL
ALT: 23 IU/L (ref 0–44)
AST: 21 IU/L (ref 0–40)
Albumin: 4.4 g/dL (ref 3.9–4.9)
Alkaline Phosphatase: 58 IU/L (ref 44–121)
Bilirubin Total: 0.6 mg/dL (ref 0.0–1.2)
Bilirubin, Direct: 0.19 mg/dL (ref 0.00–0.40)
Total Protein: 6.8 g/dL (ref 6.0–8.5)

## 2022-04-16 ENCOUNTER — Ambulatory Visit (INDEPENDENT_AMBULATORY_CARE_PROVIDER_SITE_OTHER): Payer: BC Managed Care – PPO | Admitting: Nurse Practitioner

## 2022-04-16 ENCOUNTER — Encounter: Payer: Self-pay | Admitting: Nurse Practitioner

## 2022-04-16 ENCOUNTER — Telehealth: Payer: Self-pay | Admitting: Cardiovascular Disease

## 2022-04-16 NOTE — Telephone Encounter (Signed)
Returned call to pt.  He wasn't sure if he needed refills or not, he will check and let us know if he does.  I did schedule him for his yearly f/u with Dr. Acie Fredrickson 06/21/22.

## 2022-04-16 NOTE — Progress Notes (Signed)
   Established Patient Office Visit  Subjective   Patient ID: Gabriel Tran, male    DOB: 04-Mar-1955  Age: 67 y.o. MRN: [id number]  Chief Complaint  Patient presents with   Medication Refill    HPI  Patient is followed by Dr. Cleatrice Burke cardiology.  States he called and was told that he will need an appointment for medication follow-up.  Patient presented to office thinking I was working with Dr. Cathie Olden.  Did call cardiology office they sent in refills and gave patient information in regards to calling cardiology office to follow-up with his yearly appointment.  This visit should be no charge and COVID pay refunded   ROS    Objective:     There were no vitals taken for this visit.   Physical Exam   No results found for any visits on 04/16/22.    The ASCVD Risk score (Arnett DK, et al., 2019) failed to calculate for the following reasons:   The patient has a prior MI or stroke diagnosis    Assessment & Plan:   Problem List Items Addressed This Visit   None   No follow-ups on file.    Romilda Garret, NP

## 2022-04-16 NOTE — Telephone Encounter (Signed)
*  STAT* If patient is at the pharmacy, call can be transferred to refill team.   1. Which medications need to be refilled? (please list name of each medication and dose if known) chlorthalidone (HYGROTON) 25 MG tablet   clopidogrel (PLAVIX) 75 MG tablet   losartan (COZAAR) 100 MG tablet   potassium chloride (KLOR-CON) 10 MEQ tablet   rosuvastatin (CRESTOR) 10 MG tablet    2. Which pharmacy/location (including street and city if local pharmacy) is medication to be sent to?  [facility] # [place], [place]    3. Do they need a 30 day or 90 day supply? [place]

## 2022-05-29 ENCOUNTER — Other Ambulatory Visit: Payer: Self-pay | Admitting: Cardiovascular Disease

## 2022-06-11 ENCOUNTER — Encounter: Payer: Self-pay | Admitting: Gastroenterology

## 2022-06-20 ENCOUNTER — Encounter: Payer: Self-pay | Admitting: Cardiovascular Disease

## 2022-06-20 NOTE — Progress Notes (Signed)
`    Cardiology Office Note   Date:  06/21/2022   ID:  Gabriel Tran, DOB 07-Jul-1955, MRN [id number]  PCP:  Joaquim Nam, MD  Cardiologist:   Kristeen Miss, MD   Chief Complaint  Patient presents with   Hypertension        Problem List 1. Ascending Aortic dissection - s/p repair  2  Cryptogenic stroke 3.  Essential HTN 4. Pulmonary embolus 5 . Hyperlipidemia     Gabriel Tran is a 67 y.o. male who presents for follow-up office visit.  This 67 y.o.  gentleman is seen for a scheduled follow-up office visit.  He has a past history of an aortic dissection in the setting of uncontrolled hypertension.  The dissection was in the ascending aorta.  He underwent emergent repair and his postop course was complicated by PEA arrest and pulmonary emboli.  He has a long history of hypertension. He presented to [facility] on 01/13/14 with evidence of a left brain stroke with right hemiparesis.  His initial blood pressure was in the range of 200/115.  He was given intravenous medication to bring his blood pressure down quickly after which he was given TPA.  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  He was discharged on Plavix.  He states that he is also taking a daily aspirin . Since we last saw him he has had a TEE and a implantation of a loop recorder.  The TEE showed no evidence of any source of emboli.  The loop recorder was implanted by Dr. Ladona Ridgel on 01/20/14 for evaluation of cryptogenic stroke.  So far there have been no arrhythmias detected. The patient states that his blood pressure is still running high frequently.  He is concerned because of his known aortic dissection which extends down to the renal artery. He states that he has not been taking his Crestor because it is too expensive. He has not been having any chest pain or shortness of breath.  He still has some clumsiness of his right hand from the previous  residual from stroke.  He is due to see his neurologist Dr. Pearlean Brownie next month.  He will confirm with Dr. Pearlean Brownie concerning his appropriate antiplatelet therapy for his previous stroke.   May 10, 2015:  Previous brackbill patient,     Has asc. Aortic aneurism repair - several complications related  - pulmonary emboli, cardiac tamponade,  August 21, 2016:  Gabriel Tran is seen today  No CP or dyspnea. Staying active.   Not exercising much .   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 Aneurism repair   Still eating salty foods BP is a bit high . Very little exercise   January 21, 2019: Gabriel Tran is seen today for follow-up of his ascending aortic aneurysm repair.  He also has a history of pulmonary emboli. No CP or dyspnea .  Getting some exercise .  No CVA symptoms  Has been to [employer].   Has a pinched nerve in his back.   March 23, 2020: Gabriel Tran is seen today for follow-up of his ascending aortic aneurysm repair.  He has a history of pulmonary emboli. Is not exercising regularly - has a treadmill at home   May 22, 2021 Gabriel Tran is seen today for follow up of his dissection of his asc. Aorta S/p  repair Hx of HTN  Having some DOE walking up his driveway  ( esp pulling his trashcans up the hill )  Is not exercising regularly  Had COVID in Janurary  Still not back up to his baseline   Jun 21, 2022 Gabriel Tran is seen for follow up of his ascending aortic repair ( in 2011) , hx of pulmonary emboli ( 2013)  Stroke in 2015  Not exercising regularly ,  works around the house  Is not as careful with his processed meats  - bacon or sausage 3-4  times a week     Past Medical History:  Diagnosis Date   Aortic dissection (HCC)    Cardiac tamponade 2013   Colon polyps 2017   CVA (cerebral infarction)    Hyperglycemia    a. A1C 5.9 in Sept 2013.   Hypertension    PE (pulmonary embolism)    RBBB    Renal  insufficiency 08/28/2012   Sleep apnea    history of   Stroke [facility])     Past Surgical History:  Procedure Laterality Date   CARPAL TUNNEL RELEASE     2004 RIGHT   COLONOSCOPY     INSERTION OF DIALYSIS CATHETER  11/06/2011   Procedure: INSERTION OF DIALYSIS CATHETER;  Surgeon: Kerin Perna, MD;  Location: [facility] OR;  Service: Thoracic;  Laterality: Right;   INSERTION OF DIALYSIS CATHETER  11/13/2011   Procedure: INSERTION OF DIALYSIS CATHETER;  Surgeon: Sherren Kerns, MD;  Location: [facility] OR;  Service: Vascular;  Laterality: N/A;  Ultrasound guided.   KNEE SURGERY     LEFT 1981   LEFT HEART CATHETERIZATION WITH CORONARY ANGIOGRAM N/A 10/30/2011   Procedure: LEFT HEART CATHETERIZATION WITH CORONARY ANGIOGRAM;  Surgeon: Kathleene Hazel, MD;  Location: [facility] CATH LAB;  Service: Cardiovascular;  Laterality: N/A;   LOOP RECORDER IMPLANT N/A 01/20/2014   MDT LINQ implanted by Dr Ladona Ridgel for cryptogenic stroke   MEDIASTINAL EXPLORATION  11/06/2011   Procedure: MEDIASTINAL EXPLORATION;  Surgeon: Kerin Perna, MD;  Location: [facility] OR;  Service: Thoracic;  Laterality: N/A;  sternal closure; removal of wound vac   POLYPECTOMY     TEE WITHOUT CARDIOVERSION N/A 01/20/2014   Procedure: TRANSESOPHAGEAL ECHOCARDIOGRAM (TEE);  Surgeon: Donato Schultz, MD;  Location: [facility] ENDOSCOPY;  Service: Cardiovascular;  Laterality: N/A;   THORACIC AORTIC ANEURYSM REPAIR  10/30/2011   Procedure: THORACIC ASCENDING ANEURYSM REPAIR (AAA);  Surgeon: Kerin Perna, MD;  Location: [facility] OR;  Service: Open Heart Surgery;  Laterality: N/A;  Ascending aortic dissection repair, arch reconstruction, aortic valve repair   WISDOM TOOTH EXTRACTION       Current Outpatient Medications  Medication Sig Dispense Refill   ALPRAZolam (XANAX) 0.5 MG tablet TAKE 1 TABLET(0.5 MG) BY MOUTH TWICE DAILY AS NEEDED FOR ANXIETY 30 tablet 1   amLODipine (NORVASC) 5 MG tablet Take 1 tablet (5 mg total) by mouth daily. 90 tablet 3   chlorthalidone  (HYGROTON) 25 MG tablet TAKE 1 TABLET(25 MG) BY MOUTH DAILY 90 tablet 3   Cholecalciferol (VITAMIN D3) 50 MCG (2000 UT) TABS Take by mouth. daily     clopidogrel (PLAVIX) 75 MG tablet TAKE 1 TABLET(75 MG) BY MOUTH DAILY. 90 tablet 3   cyclobenzaprine (FLEXERIL) 10 MG tablet TAKE 1/2 TO 1 TABLET BY MOUTH THREE TIMES DAILY AS NEEDED FOR MUSCLE SPASMS,( SEDATION CAUTION) 30 tablet 0   losartan (COZAAR) 100 MG tablet TAKE ONE TABLET (100MG  TOTAL) BY MOUTH ONE TIME DAILY 90 tablet  0   methocarbamol (ROBAXIN) 500 MG tablet Take 1 tablet by mouth daily as needed for spasms.  0   metoprolol tartrate (LOPRESSOR) 50 MG tablet Take 1 tablet (50 mg total) by mouth 2 (two) times daily. 180 tablet 3   Multiple Vitamin (MULTIVITAMIN WITH MINERALS) TABS tablet Take 1 tablet by mouth 2 (two) times daily.      potassium chloride (KLOR-CON) 10 MEQ tablet Take 1 tablet (10 mEq total) by mouth daily. 90 tablet 3   rosuvastatin (CRESTOR) 10 MG tablet Take 1 tablet (10 mg total) by mouth daily. 90 tablet 3   traMADol (ULTRAM) 50 MG tablet Take 1 tablet by mouth daily as needed for pain. MAX OF 4 TABLETS PER DAY  0   No current facility-administered medications for this visit.    Allergies:   Carvedilol and Lipitor [atorvastatin]    Social History:  The patient  reports that he quit smoking about 32 years ago. His smoking use included cigarettes. He has a 25.00 pack-year smoking history. He quit smokeless tobacco use about 10 years ago.  His smokeless tobacco use included snuff. He reports current alcohol use of about 6.0 standard drinks of alcohol per week. He reports that he does not use drugs.   Family History:  The patient's family history includes Colon polyps in his brother; Heart disease in his brother and father; Hyperlipidemia in his brother, brother, father, and mother; Hypertension in his brother, brother, and father; Kidney disease in his brother; Leukemia in his father; Parkinsonism in his mother.     ROS:  Please see the history of present illness.   Otherwise, review of systems are positive for none.   All other systems are reviewed and negative.    Physical Exam: Blood pressure (!) 142/75, pulse (!) 57, height 5\' 7"  (1.702 m), weight 220 lb (99.8 kg), SpO2 97 %.  HYPERTENSION CONTROL Vitals:   06/21/22 1534 06/21/22 1607  BP: (!) 154/84 (!) 142/75    The patient's blood pressure is elevated above target today.  In order to address the patient's elevated BP: Blood pressure will be monitored at home to determine if medication changes need to be made.       GEN:  Well nourished, well developed in no acute distress HEENT: Normal NECK: No JVD; No carotid bruits LYMPHATICS: No lymphadenopathy CARDIAC: RRR , musical S2,  no significant murmur  RESPIRATORY:  Clear to auscultation without rales, wheezing or rhonchi  ABDOMEN: Soft, non-tender, non-distended MUSCULOSKELETAL:  No edema; No deformity  SKIN: Warm and dry NEUROLOGIC:  Alert and oriented x 3  EKG: Jun 21, 2022: Sinus bradycardia with first-degree AV block.  Occasional premature ventricular contractions.  Right bundle branch block.   Recent Labs: 08/29/2021: ALT 23    Lipid Panel    Component Value Date/Time   CHOL 166 08/29/2021 0736   TRIG 101 08/29/2021 0736   HDL 57 08/29/2021 0736   CHOLHDL 2.9 08/29/2021 0736   CHOLHDL 3 08/31/2019 0847   VLDL 22.6 08/31/2019 0847   LDLCALC 91 08/29/2021 0736   LDLDIRECT 142.8 08/31/2011 0835      Wt Readings from Last 3 Encounters:  06/21/22 220 lb (99.8 kg)  04/16/22 224 lb 8 oz (101.8 kg)  05/22/21 221 lb 3.2 oz (100.3 kg)      Other studies Reviewed: Additional studies/ records that were reviewed today include: Discharge summary from [facility] on 01/16[facility]15 Review of the above records demonstrates: Discharged on Plavix.   ASSESSMENT AND  PLAN:  1.    left brain stroke felt to be probably embolic , successfully treated with TPA.       2.  Essential  hypertension,   eats lots of processed foods.  He agrees to cut back on bacan, sausage.  Continue sam emds   3.  Prior history of a ascending aortic root dissection with emergent surgery .      4.  Prior history of PEA arrest and pulmonary emboli postoperatively    5.  Hypercholesterolemia, -       6.   DOE:   will get an echo .  He also may have some deconditioning.  I encouraged him to work on getting regular exercise.   Current medicines are reviewed at length with the patient today.  The patient does not have concerns regarding medicines.   Labs/ tests ordered today include: Lipid panel hepatic function panel and basal metabolic panel   No orders of the defined types were placed in this encounter.     Kristeen Miss, MD  06/21/2022 4:10 PM    [facility] [address] [place], [place], [place]  [address] Phone: [phone]; Fax: [phone]

## 2022-06-21 ENCOUNTER — Encounter: Payer: Self-pay | Admitting: Cardiovascular Disease

## 2022-06-21 ENCOUNTER — Ambulatory Visit: Payer: BC Managed Care – PPO | Attending: Cardiovascular Disease | Admitting: Cardiovascular Disease

## 2022-06-21 VITALS — BP 142/75 | HR 57 | Ht 67.0 in | Wt 220.0 lb

## 2022-06-21 DIAGNOSIS — E782 Mixed hyperlipidemia: Secondary | ICD-10-CM | POA: Diagnosis not present

## 2022-06-21 DIAGNOSIS — R0609 Other forms of dyspnea: Secondary | ICD-10-CM

## 2022-06-21 DIAGNOSIS — I7100 Dissection of unspecified site of aorta: Secondary | ICD-10-CM | POA: Diagnosis not present

## 2022-06-21 NOTE — Patient Instructions (Signed)
Medication Instructions:  Your physician recommends that you continue on your current medications as directed. Please refer to the Current Medication list given to you today.  *If you need a refill on your cardiac medications before your next appointment, please call your pharmacy*  Lab Work: Lipids, BMET, ALT today If you have labs (blood work) drawn today and your tests are completely normal, you will receive your results only by: MyChart Message (if you have MyChart) OR A paper copy in the mail If you have any lab test that is abnormal or we need to change your treatment, we will call you to review the results.  Testing/Procedures: ECHO Your physician has requested that you have an echocardiogram. Echocardiography is a painless test that uses sound waves to create images of your heart. It provides your doctor with information about the size and shape of your heart and how well your heart's chambers and valves are working. This procedure takes approximately one hour. There are no restrictions for this procedure. Please do NOT wear cologne, perfume, aftershave, or lotions (deodorant is allowed). Please arrive 15 minutes prior to your appointment time.  Follow-Up: At [facility], you and your health needs are our priority.  As part of our continuing mission to provide you with exceptional heart care, we have created designated Provider Care Teams.  These Care Teams include your primary Cardiologist (physician) and Advanced Practice Providers (APPs -  Physician Assistants and Nurse Practitioners) who all work together to provide you with the care you need, when you need it.  Your next appointment:   6 month(s)  Provider:   Lissa Hoard or Alben Spittle

## 2022-06-22 LAB — BASIC METABOLIC PANEL
BUN/Creatinine Ratio: 16 (ref 10–24)
BUN: 20 mg/dL (ref 8–27)
CO2: 25 mmol/L (ref 20–29)
Calcium: 10.1 mg/dL (ref 8.6–10.2)
Chloride: 95 mmol/L — ABNORMAL LOW (ref 96–106)
Creatinine, Ser: 1.23 mg/dL (ref 0.76–1.27)
Glucose: 96 mg/dL (ref 70–99)
Potassium: 4.4 mmol/L (ref 3.5–5.2)
Sodium: 134 mmol/L (ref 134–144)
eGFR: 66 mL/min/{1.73_m2} (ref >59)

## 2022-06-22 LAB — LIPID PANEL
Chol/HDL Ratio: 2.7 ratio (ref 0.0–5.0)
Cholesterol, Total: 178 mg/dL (ref 100–199)
HDL: 67 mg/dL (ref >39)
LDL Chol Calc (NIH): 92 mg/dL (ref 0–99)
Triglycerides: 106 mg/dL (ref 0–149)
VLDL Cholesterol Cal: 19 mg/dL (ref 5–40)

## 2022-06-22 LAB — ALT: ALT: 19 IU/L (ref 0–44)

## 2022-06-26 ENCOUNTER — Other Ambulatory Visit (INDEPENDENT_AMBULATORY_CARE_PROVIDER_SITE_OTHER): Payer: BC Managed Care – PPO

## 2022-06-26 DIAGNOSIS — Z79899 Other long term (current) drug therapy: Secondary | ICD-10-CM | POA: Diagnosis not present

## 2022-06-26 DIAGNOSIS — R0609 Other forms of dyspnea: Secondary | ICD-10-CM | POA: Diagnosis not present

## 2022-06-26 DIAGNOSIS — I7100 Dissection of unspecified site of aorta: Secondary | ICD-10-CM

## 2022-07-07 ENCOUNTER — Other Ambulatory Visit: Payer: Self-pay | Admitting: Cardiovascular Disease

## 2022-07-19 ENCOUNTER — Ambulatory Visit (HOSPITAL_COMMUNITY): Payer: BC Managed Care – PPO

## 2022-07-24 ENCOUNTER — Ambulatory Visit (HOSPITAL_COMMUNITY): Payer: BC Managed Care – PPO | Attending: Cardiology

## 2022-07-24 DIAGNOSIS — I7100 Dissection of unspecified site of aorta: Secondary | ICD-10-CM

## 2022-07-24 DIAGNOSIS — R0609 Other forms of dyspnea: Secondary | ICD-10-CM

## 2022-07-24 DIAGNOSIS — E782 Mixed hyperlipidemia: Secondary | ICD-10-CM

## 2022-07-24 LAB — ECHOCARDIOGRAM COMPLETE
Area-P 1/2: 2.62 cm2
P 1/2 time: 414 msec
S' Lateral: 4 cm

## 2022-07-25 ENCOUNTER — Other Ambulatory Visit: Payer: Self-pay | Admitting: Cardiovascular Disease

## 2022-07-27 ENCOUNTER — Telehealth: Payer: Self-pay

## 2022-07-27 DIAGNOSIS — I77810 Thoracic aortic ectasia: Secondary | ICD-10-CM

## 2022-07-27 DIAGNOSIS — Z01810 Encounter for preprocedural cardiovascular examination: Secondary | ICD-10-CM

## 2022-07-27 NOTE — Telephone Encounter (Signed)
-----   Message from Vesta Mixer, MD sent at 07/25/2022  5:35 PM EDT ----- LVEF is mildly reduced with an EF of 45 to 50%.  Mild LVH.  Normal diastolic function. Trivial mitral regurgitation  Please get a CTA of the aorta ( gated )  .

## 2022-07-27 NOTE — Telephone Encounter (Signed)
Called and spoke with patient who agrees to CTA of aorta. BMET last completed 5/16, so new order placed at this time.   LVEF is mildly reduced with an EF of 45 to 50%.  Mild LVH.  Normal diastolic function. Trivial mitral regurgitation   Please get a CTA of the aorta .

## 2022-08-20 ENCOUNTER — Other Ambulatory Visit: Payer: Self-pay | Admitting: Cardiovascular Disease

## 2022-09-04 ENCOUNTER — Other Ambulatory Visit: Payer: Self-pay | Admitting: Cardiovascular Disease

## 2022-09-12 ENCOUNTER — Telehealth: Payer: Self-pay

## 2022-09-12 ENCOUNTER — Ambulatory Visit: Payer: BC Managed Care – PPO | Admitting: Gastroenterology

## 2022-09-12 ENCOUNTER — Encounter: Payer: Self-pay | Admitting: Gastroenterology

## 2022-09-12 VITALS — BP 140/70 | HR 56 | Ht 66.0 in | Wt 218.2 lb

## 2022-09-12 DIAGNOSIS — Z86010 Personal history of colonic polyps: Secondary | ICD-10-CM | POA: Diagnosis not present

## 2022-09-12 DIAGNOSIS — Z7902 Long term (current) use of antithrombotics/antiplatelets: Secondary | ICD-10-CM

## 2022-09-12 NOTE — Telephone Encounter (Signed)
[facility] Pre-operative Risk Assessment     Request for surgical clearance:     Endoscopy Procedure  What type of surgery is being performed?     Colonoscopy   When is this surgery scheduled?     12-13-2022  What type of clearance is required ?   Pharmacy  Are there any medications that need to be held prior to surgery and how long? Plavix, 5day hold request  Practice name and name of physician performing surgery?      [facility]  What is your office phone and fax number?      Phone- [phone]  Fax- [phone]  Anesthesia type (None, local, MAC, general) ?       MAC

## 2022-09-12 NOTE — Progress Notes (Deleted)
[facility] Consult Note:  History: Gabriel Tran 09/12/2022  Referring provider: Joaquim Nam, MD  Reason for consult/chief complaint: hx of colon polyps (To discuss having a colon, pt on Plavix)   Subjective  HPI: Gabriel Tran is here to discuss a surveillance colonoscopy.  He had 3 diminutive polyps in July 2017, 2 were tubular adenomas without high-grade dysplasia, the other small polyp was not retrieved.  He also had adenomatous polyps in March 2012.  Plavix was held without difficulty for the 2017 exam.  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  He also had surgical repair of an ascending aortic aneurysm in November 2019. Most recent cardiology office note by Dr.Nasher on 06/21/2022 was reviewed.  Echocardiogram was obtained on 07/24/2022.  LVEF 45 to 50%, no diastolic dysfunction, noted dilatation of the aortic root.  Chest CTA planned  _____________________   ***   ROS:  Review of Systems   Past Medical History: Past Medical History:  Diagnosis Date   Aortic dissection (HCC)    Cardiac tamponade 2013   Colon polyps 2017   CVA (cerebral infarction)    Hyperglycemia    a. A1C 5.9 in Sept 2013.   Hypertension    PE (pulmonary embolism)    RBBB    Renal insufficiency 08/28/2012   Sleep apnea    history of   Stroke [facility])      Past Surgical History: Past Surgical History:  Procedure Laterality Date   CARPAL TUNNEL RELEASE     2004 RIGHT   COLONOSCOPY     INSERTION OF DIALYSIS CATHETER  11/06/2011   Procedure: INSERTION OF DIALYSIS CATHETER;  Surgeon: Kerin Perna, MD;  Location: [facility] OR;  Service: Thoracic;  Laterality: Right;   INSERTION OF DIALYSIS CATHETER  11/13/2011   Procedure: INSERTION OF DIALYSIS CATHETER;  Surgeon: Sherren Kerns, MD;  Location: [facility] OR;  Service: Vascular;  Laterality: N/A;  Ultrasound guided.   KNEE SURGERY     LEFT 1981   LEFT HEART CATHETERIZATION WITH CORONARY ANGIOGRAM  N/A 10/30/2011   Procedure: LEFT HEART CATHETERIZATION WITH CORONARY ANGIOGRAM;  Surgeon: Kathleene Hazel, MD;  Location: [facility] CATH LAB;  Service: Cardiovascular;  Laterality: N/A;   LOOP RECORDER IMPLANT N/A 01/20/2014   MDT LINQ implanted by Dr Ladona Ridgel for cryptogenic stroke   MEDIASTINAL EXPLORATION  11/06/2011   Procedure: MEDIASTINAL EXPLORATION;  Surgeon: Kerin Perna, MD;  Location: [facility] OR;  Service: Thoracic;  Laterality: N/A;  sternal closure; removal of wound vac   POLYPECTOMY     TEE WITHOUT CARDIOVERSION N/A 01/20/2014   Procedure: TRANSESOPHAGEAL ECHOCARDIOGRAM (TEE);  Surgeon: Donato Schultz, MD;  Location: [facility] ENDOSCOPY;  Service: Cardiovascular;  Laterality: N/A;   THORACIC AORTIC ANEURYSM REPAIR  10/30/2011   Procedure: THORACIC ASCENDING ANEURYSM REPAIR (AAA);  Surgeon: Kerin Perna, MD;  Location: [facility] OR;  Service: Open Heart Surgery;  Laterality: N/A;  Ascending aortic dissection repair, arch reconstruction, aortic valve repair   WISDOM TOOTH EXTRACTION       Family History: Family History  Problem Relation Age of Onset   Parkinsonism Mother    Hyperlipidemia Mother    Leukemia Father        AML at 26   Heart disease Father        Angioplasty in his 6s, CABG age 78.   Hypertension Father    Hyperlipidemia Father    Colon polyps Brother    Kidney disease  Brother    Heart disease Brother        CABG at age 85.   Hyperlipidemia Brother    Hypertension Brother    Hyperlipidemia Brother    Hypertension Brother    Prostate cancer Neg Hx    Colon cancer Neg Hx     Social History: Social History   Socioeconomic History   Marital status: Married    Spouse name: Not on file   Number of children: Not on file   Years of education: Not on file   Highest education level: Not on file  Occupational History   Not on file  Tobacco Use   Smoking status: Former    Current packs/day: 0.00    Average packs/day: 1 pack/day for 25.0 years (25.0 ttl pk-yrs)    Types:  Cigarettes    Start date: 02/05/1965    Quit date: 02/05/1990    Years since quitting: 32.6   Smokeless tobacco: Former    Types: Snuff    Quit date: 01/05/2012   Tobacco comments:    Smoked for 20 years.  Vaping Use   Vaping status: Never Used  Substance and Sexual Activity   Alcohol use: Yes    Alcohol/week: 6.0 standard drinks of alcohol    Types: 6 Cans of beer per week    Comment: 6 beers a week on average   Drug use: No   Sexual activity: Yes    Birth control/protection: Surgical    Comment: Vasectomy  Other Topics Concern   Not on file  Social History Narrative   Prev worked at [employer] with frequent long travel.     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     Married 2000, 2 adult step kids, 3 grandkids.     Social Determinants of Health   Financial Resource Strain: Not on file  Food Insecurity: Not on file  Transportation Needs: Not on file  Physical Activity: Not on file  Stress: Not on file  Social Connections: Unknown (06/20/2021)   [facility]   Social Network    Social Network: Not on file    Allergies: Allergies  Allergen Reactions   Carvedilol     "felt terrible", headache   Lipitor [Atorvastatin]     aches    Outpatient Meds: Current Outpatient Medications  Medication Sig Dispense Refill   ALPRAZolam (XANAX) 0.5 MG tablet TAKE 1 TABLET(0.5 MG) BY MOUTH TWICE DAILY AS NEEDED FOR ANXIETY 30 tablet 1   amLODipine (NORVASC) 5 MG tablet Take 1 tablet (5 mg total) by mouth daily. 90 tablet 3   chlorthalidone (HYGROTON) 25 MG tablet TAKE ONE TABLET (25MG ) BY MOUTH ONE TIME DAILY 90 tablet 1   Cholecalciferol (VITAMIN D3) 50 MCG (2000 UT) TABS Take by mouth. daily     clopidogrel (PLAVIX) 75 MG tablet TAKE ONE TABLET (75MG ) BY MOUTH ONE TIME DAILY 90 tablet 1   cyclobenzaprine (FLEXERIL) 10 MG tablet TAKE 1/2 TO 1 TABLET BY MOUTH THREE TIMES DAILY AS NEEDED FOR MUSCLE SPASMS,( SEDATION CAUTION) 30 tablet 0   losartan (COZAAR) 100 MG  tablet TAKE ONE TABLET BY MOUTH ONCE A DAY 90 tablet 2   methocarbamol (ROBAXIN) 500 MG tablet Take 1 tablet by mouth daily as needed for spasms.  0   metoprolol tartrate (LOPRESSOR) 50 MG tablet TAKE ONE TABLET BY MOUTH TWICE DAILY 180 tablet 3   Multiple Vitamin (MULTIVITAMIN WITH MINERALS) TABS tablet Take 1 tablet by mouth 2 (two) times  daily.      potassium chloride (KLOR-CON) 10 MEQ tablet TAKE ONE TABLET (10 MEQ TOTAL) BY MOUTH ONE TIME DAILY 90 tablet 3   rosuvastatin (CRESTOR) 10 MG tablet TAKE ONE TABLET BY MOUTH ONE TIME DAILY 90 tablet 3   traMADol (ULTRAM) 50 MG tablet Take 1 tablet by mouth daily as needed for pain. MAX OF 4 TABLETS PER DAY  0   No current facility-administered medications for this visit.      ___________________________________________________________________ Objective   Exam:  Ht 5\' 6"  (1.676 m) Comment: height measured without shoes  Wt 218 lb 4 oz (99 kg)   BMI 35.23 kg/m  Wt Readings from Last 3 Encounters:  09/12/22 218 lb 4 oz (99 kg)  06/21/22 220 lb (99.8 kg)  04/16/22 224 lb 8 oz (101.8 kg)    General: ***  Eyes: sclera anicteric, no redness ENT: oral mucosa moist without lesions, no cervical or supraclavicular lymphadenopathy CV: ***, no JVD, no peripheral edema Resp: clear to auscultation bilaterally, normal RR and effort noted GI: soft, *** tenderness, with active bowel sounds. No guarding or palpable organomegaly noted. Skin; warm and dry, no rash or jaundice noted Neuro: awake, alert and oriented x 3. Normal gross motor function and fluent speech  Labs:  ***  Radiologic Studies:  1. Left ventricular ejection fraction, by estimation, is 45 to 50%. The  left ventricle has mildly decreased function. The left ventricle  demonstrates regional wall motion abnormalities, Inferior hypokinesis.  There is mild left ventricular hypertrophy.  Left ventricular diastolic parameters were normal.   2. Right ventricular systolic function is  mildly reduced. The right  ventricular size is mildly enlarged. There is normal pulmonary artery  systolic pressure. The estimated right ventricular systolic pressure is  33.5 mmHg.   3. Left atrial size was moderately dilated.   4. Right atrial size was moderately dilated.   5. The mitral valve is normal in structure. Trivial mitral valve  regurgitation.   6. The aortic valve is tricuspid. Aortic valve regurgitation is mild to  moderate. No aortic stenosis is present.   7. Aortic dilatation noted. There is dilatation of the aortic root,  measuring 43 mm.   8. The inferior vena cava is normal in size with greater than 50%  respiratory variability, suggesting right atrial pressure of 3 mmHg.    Assessment: No diagnosis found.  ***  Plan:  ***  Thank you for the courtesy of this consult.  Please call me with any questions or concerns.  Charlie Pitter III  CC: Referring provider noted above

## 2022-09-12 NOTE — Patient Instructions (Signed)
_______________________________________________________  If your blood pressure at your visit was 140/90 or greater, please contact your primary care physician to follow up on this.  _______________________________________________________  If you are age 65 or older, your body mass index should be between 23-30. Your Body mass index is 35.23 kg/m. If this is out of the aforementioned range listed, please consider follow up with your Primary Care Provider.  If you are age 16 or younger, your body mass index should be between 19-25. Your Body mass index is 35.23 kg/m. If this is out of the aformentioned range listed, please consider follow up with your Primary Care Provider.   ________________________________________________________  The [facility] providers would like to encourage you to use [facility] to communicate with providers for non-urgent requests or questions.  Due to long hold times on the telephone, sending your provider a message by [facility] may be a faster and more efficient way to get a response.  Please allow 48 business hours for a response.  Please remember that this is for non-urgent requests.  _______________________________________________________  It was a pleasure to see you today!  Thank you for trusting me with your gastrointestinal care!

## 2022-09-12 NOTE — Telephone Encounter (Signed)
1st attempt at scheduling tele preop clearance. lvm

## 2022-09-12 NOTE — Telephone Encounter (Signed)
   Name: Gabriel Tran  DOB: 19-Feb-1955  MRN: [id number]  Primary Cardiologist: Kristeen Miss, MD   Preoperative team, please contact this patient and set up a phone call appointment for further preoperative risk assessment. Please obtain consent and complete medication review. Thank you for your help.Last seen by Dr. Elease Hashimoto 06/21/2022. Planned colonoscopy 12/13/2022.   I confirm that guidance regarding antiplatelet and oral anticoagulation therapy has been completed and, if necessary, noted below:   Per office protocol, if patient is without any new symptoms or concerns at the time of their virtual visit, he/she may hold Plavix for 5 days prior to procedure. Please resume Plavix as soon as possible postprocedure, at the discretion of the surgeon.     Joni Reining, NP 09/12/2022, 3:39 PM [facility]

## 2022-09-12 NOTE — Progress Notes (Signed)
[facility] Consult Note:  History: Gabriel Tran 09/12/2022  Referring provider: Joaquim Nam, MD  Reason for consult/chief complaint: hx of colon polyps (To discuss having a colon, pt on Plavix)   Subjective  HPI: Gabriel Tran is here to discuss a surveillance colonoscopy.  He had 3 diminutive polyps in July 2017, 2 were tubular adenomas without high-grade dysplasia, the other small polyp was not retrieved.  He also had adenomatous polyps in March 2012.  Plavix was held without difficulty for the 2017 exam.  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  He also had surgical repair of an ascending aortic aneurysm in November 2019. Most recent cardiology office note by Dr.Nasher on 06/21/2022 was reviewed.  Echocardiogram was obtained on 07/24/2022.  LVEF 45 to 50%, no diastolic dysfunction, noted dilatation of the aortic root.  Chest CTA planned  _____________________  Today, he is here to discuss having a recall colonoscopy. He reports occasionally experiencing minimal blood in stool but is not concerned about it. Other than that, he reports feeling well today with no GI complains. He's been on Plavix per his cardiologist recommendation.   We reviewed his previous visit with cardiology and discussed several imaging and exams that were done.   Patient denies diarrhea, constipation, nausea, blood in stool, black stool, vomiting, abdominal pain, bloating, unintentional weight loss, reflux, dysphagia.  He reports having a paternal history of leukemia and polyps in siblings.  ROS:  Review of Systems  Constitutional:  Negative for appetite change and fever.  HENT:  Negative for trouble swallowing.   Respiratory:  Negative for cough and shortness of breath.   Cardiovascular:  Negative for chest pain.  Gastrointestinal:  Negative for abdominal distention, abdominal pain, anal bleeding, blood in stool, constipation, diarrhea, nausea,  rectal pain and vomiting.  Genitourinary:  Negative for dysuria.  Musculoskeletal:  Negative for back pain.  Skin:  Negative for rash.  Neurological:  Negative for weakness.  All other systems reviewed and are negative.    Past Medical History: Past Medical History:  Diagnosis Date   Aortic dissection (HCC)    Cardiac tamponade 2013   Colon polyps 2017   CVA (cerebral infarction)    HLD (hyperlipidemia)    Hyperglycemia    a. A1C 5.9 in Sept 2013.   Hypertension    PE (pulmonary embolism)    RBBB    Renal insufficiency 08/28/2012   Sleep apnea    history of   Stroke [facility])      Past Surgical History: Past Surgical History:  Procedure Laterality Date   CARPAL TUNNEL RELEASE Right 2004   COLONOSCOPY     INSERTION OF DIALYSIS CATHETER  11/06/2011   Procedure: INSERTION OF DIALYSIS CATHETER;  Surgeon: Kerin Perna, MD;  Location: [facility] OR;  Service: Thoracic;  Laterality: Right;   INSERTION OF DIALYSIS CATHETER  11/13/2011   Procedure: INSERTION OF DIALYSIS CATHETER;  Surgeon: Sherren Kerns, MD;  Location: [facility] OR;  Service: Vascular;  Laterality: N/A;  Ultrasound guided.   LEFT HEART CATHETERIZATION WITH CORONARY ANGIOGRAM N/A 10/30/2011   Procedure: LEFT HEART CATHETERIZATION WITH CORONARY ANGIOGRAM;  Surgeon: Kathleene Hazel, MD;  Location: [facility] CATH LAB;  Service: Cardiovascular;  Laterality: N/A;   LIGAMENT REPAIR Left 1981   Knee   LOOP RECORDER IMPLANT N/A 01/20/2014   MDT LINQ implanted by Dr Ladona Ridgel for cryptogenic stroke   MEDIASTINAL EXPLORATION  11/06/2011   Procedure: MEDIASTINAL EXPLORATION;  Surgeon: Kerin Perna, MD;  Location: [facility] OR;  Service: Thoracic;  Laterality: N/A;  sternal closure; removal of wound vac   POLYPECTOMY     TEE WITHOUT CARDIOVERSION N/A 01/20/2014   Procedure: TRANSESOPHAGEAL ECHOCARDIOGRAM (TEE);  Surgeon: Donato Schultz, MD;  Location: [facility] ENDOSCOPY;  Service: Cardiovascular;  Laterality: N/A;   THORACIC AORTIC ANEURYSM REPAIR   10/30/2011   Procedure: THORACIC ASCENDING ANEURYSM REPAIR (AAA);  Surgeon: Kerin Perna, MD;  Location: [facility] OR;  Service: Open Heart Surgery;  Laterality: N/A;  Ascending aortic dissection repair, arch reconstruction, aortic valve repair   WISDOM TOOTH EXTRACTION       Family History: Family History  Problem Relation Age of Onset   Parkinsonism Mother    Hyperlipidemia Mother    Stroke Mother    Leukemia Father        AML at 30   Heart disease Father        Angioplasty in his 69s, CABG age 89.   Hypertension Father    Hyperlipidemia Father    Colon polyps Brother    Kidney disease Brother    Heart disease Brother        CABG at age 56.   Hyperlipidemia Brother    Hypertension Brother    Hyperlipidemia Brother    Hypertension Brother    Heart attack Maternal Grandfather    Alzheimer's disease Paternal Grandmother    Heart attack Paternal Grandfather    Prostate cancer Neg Hx    Colon cancer Neg Hx     Social History: Social History   Socioeconomic History   Marital status: Married    Spouse name: Not on file   Number of children: 0   Years of education: Not on file   Highest education level: Not on file  Occupational History   Occupation: retired  Tobacco Use   Smoking status: Former    Current packs/day: 0.00    Average packs/day: 1 pack/day for 25.0 years (25.0 ttl pk-yrs)    Types: Cigarettes    Start date: 02/05/1965    Quit date: 02/05/1990    Years since quitting: 32.6   Smokeless tobacco: Former    Types: Snuff    Quit date: 01/05/2012   Tobacco comments:    Smoked for 20 years.  Vaping Use   Vaping status: Never Used  Substance and Sexual Activity   Alcohol use: Yes    Alcohol/week: 6.0 standard drinks of alcohol    Types: 6 Cans of beer per week    Comment: 6 beers a week on average   Drug use: No   Sexual activity: Yes    Birth control/protection: Surgical    Comment: Vasectomy  Other Topics Concern   Not on file  Social History Narrative    Prev worked at [employer] with frequent long travel.     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     Married 2000, 2 adult step kids, 3 grandkids.     Social Determinants of Health   Financial Resource Strain: Not on file  Food Insecurity: Not on file  Transportation Needs: Not on file  Physical Activity: Not on file  Stress: Not on file  Social Connections: Unknown (06/20/2021)   Received from [facility]   Social Network    Social Network: Not on file    Allergies: Allergies  Allergen Reactions   Carvedilol     "felt terrible", headache   Lipitor [Atorvastatin]     aches  Outpatient Meds: Current Outpatient Medications  Medication Sig Dispense Refill   ALPRAZolam (XANAX) 0.5 MG tablet TAKE 1 TABLET(0.5 MG) BY MOUTH TWICE DAILY AS NEEDED FOR ANXIETY 30 tablet 1   amLODipine (NORVASC) 5 MG tablet Take 1 tablet (5 mg total) by mouth daily. 90 tablet 3   chlorthalidone (HYGROTON) 25 MG tablet TAKE ONE TABLET (25MG ) BY MOUTH ONE TIME DAILY 90 tablet 1   Cholecalciferol (VITAMIN D3) 50 MCG (2000 UT) TABS Take by mouth. daily     clopidogrel (PLAVIX) 75 MG tablet TAKE ONE TABLET (75MG ) BY MOUTH ONE TIME DAILY 90 tablet 1   cyclobenzaprine (FLEXERIL) 10 MG tablet TAKE 1/2 TO 1 TABLET BY MOUTH THREE TIMES DAILY AS NEEDED FOR MUSCLE SPASMS,( SEDATION CAUTION) 30 tablet 0   losartan (COZAAR) 100 MG tablet TAKE ONE TABLET BY MOUTH ONCE A DAY 90 tablet 2   methocarbamol (ROBAXIN) 500 MG tablet Take 1 tablet by mouth daily as needed for spasms.  0   metoprolol tartrate (LOPRESSOR) 50 MG tablet TAKE ONE TABLET BY MOUTH TWICE DAILY 180 tablet 3   Multiple Vitamin (MULTIVITAMIN WITH MINERALS) TABS tablet Take 1 tablet by mouth 2 (two) times daily.      potassium chloride (KLOR-CON) 10 MEQ tablet TAKE ONE TABLET (10 MEQ TOTAL) BY MOUTH ONE TIME DAILY 90 tablet 3   rosuvastatin (CRESTOR) 10 MG tablet TAKE ONE TABLET BY MOUTH ONE TIME DAILY 90 tablet 3   traMADol (ULTRAM) 50 MG tablet  Take 1 tablet by mouth daily as needed for pain. MAX OF 4 TABLETS PER DAY  0   No current facility-administered medications for this visit.      ___________________________________________________________________ Objective   Exam:  BP (!) 140/70 (BP Location: Left Arm, Patient Position: Sitting, Cuff Size: Large)   Pulse (!) 56   Ht 5\' 6"  (1.676 m) Comment: height measured without shoes  Wt 218 lb 4 oz (99 kg)   BMI 35.23 kg/m  Wt Readings from Last 3 Encounters:  09/12/22 218 lb 4 oz (99 kg)  06/21/22 220 lb (99.8 kg)  04/16/22 224 lb 8 oz (101.8 kg)   General: well-appearing   Eyes: sclera anicteric, no redness ENT: oral mucosa moist without lesions, no cervical or supraclavicular lymphadenopathy CV: RRR, no JVD, no peripheral edema -  systolic murmur.  Resp: clear to auscultation bilaterally, normal RR and effort noted GI: soft, no tenderness, with active bowel sounds. No guarding or palpable organomegaly noted. Skin; warm and dry, no rash or jaundice noted Neuro: awake, alert and oriented x 3. Normal gross motor function and fluent speech   Labs:  Radiologic Studies:  1. Left ventricular ejection fraction, by estimation, is 45 to 50%. The  left ventricle has mildly decreased function. The left ventricle  demonstrates regional wall motion abnormalities, Inferior hypokinesis.  There is mild left ventricular hypertrophy.  Left ventricular diastolic parameters were normal.   2. Right ventricular systolic function is mildly reduced. The right  ventricular size is mildly enlarged. There is normal pulmonary artery  systolic pressure. The estimated right ventricular systolic pressure is  33.5 mmHg.   3. Left atrial size was moderately dilated.   4. Right atrial size was moderately dilated.   5. The mitral valve is normal in structure. Trivial mitral valve  regurgitation.   6. The aortic valve is tricuspid. Aortic valve regurgitation is mild to  moderate. No aortic  stenosis is present.   7. Aortic dilatation noted. There is dilatation of the  aortic root,  measuring 43 mm.   8. The inferior vena cava is normal in size with greater than 50%  respiratory variability, suggesting right atrial pressure of 3 mmHg.    Assessment: History of colon polyps  Long term (current) use of antithrombotics/antiplatelets  I think he is well enough to undergo a surveillance colonoscopy.  His upcoming CTA appears to be a routine check after prior ascending aorta surgery.  However, we will book his procedure far enough out that this report will be available and reviewed by his cardiologist to make sure there are no concerns regarding sedation for endoscopic procedure.  His cardiovascular condition otherwise appears stable.  Gabriel Tran wished to do this procedure in November based on his current availability.  He will need to hold Plavix 5 days prior to procedure, leading to a small but real risk of cerebrovascular or cardiovascular event while he is off it.  He understands and accepts that, and we will clear this with his cardiologist or neurologist as appropriate. Plan: He was agreeable to a colonoscopy after discussion of procedure and risks.  The benefits and risks of the planned procedure were described in detail with the patient or (when appropriate) their health care proxy.  Risks were outlined as including, but not limited to, bleeding, infection, perforation, adverse medication reaction leading to cardiac or pulmonary decompensation, pancreatitis (if ERCP).  The limitation of incomplete mucosal visualization was also discussed.  No guarantees or warranties were given.   Thank you for the courtesy of this consult.  Please call me with any questions or concerns.  I,Safa M Kadhim,acting as a scribe for Charlie Pitter III, MD.,have documented all relevant documentation on the behalf of Sherrilyn Rist, MD,as directed by  Sherrilyn Rist, MD while in the presence of Sherrilyn Rist, MD.   Marvis Repress III, MD, have reviewed all documentation for this visit. The documentation on 09/12/22 for the exam, diagnosis, procedures, and orders are all accurate and complete.    CC: Referring provider noted above

## 2022-09-14 ENCOUNTER — Telehealth: Payer: Self-pay

## 2022-09-14 NOTE — Telephone Encounter (Signed)
  Patient Consent for Virtual Visit         Gabriel Tran has provided verbal consent on 09/14/2022 for a virtual visit (video or telephone).   CONSENT FOR VIRTUAL VISIT FOR:  Gabriel Tran  By participating in this virtual visit I agree to the following:  I hereby voluntarily request, consent and authorize [facility] and its employed or contracted physicians, physician assistants, nurse practitioners or other licensed health care professionals (the Practitioner), to provide me with telemedicine health care services (the "Services") as deemed necessary by the treating Practitioner. I acknowledge and consent to receive the Services by the Practitioner via telemedicine. I understand that the telemedicine visit will involve communicating with the Practitioner through live audiovisual communication technology and the disclosure of certain medical information by electronic transmission. I acknowledge that I have been given the opportunity to request an in-person assessment or other available alternative prior to the telemedicine visit and am voluntarily participating in the telemedicine visit.  I understand that I have the right to withhold or withdraw my consent to the use of telemedicine in the course of my care at any time, without affecting my right to future care or treatment, and that the Practitioner or I may terminate the telemedicine visit at any time. I understand that I have the right to inspect all information obtained and/or recorded in the course of the telemedicine visit and may receive copies of available information for a reasonable fee.  I understand that some of the potential risks of receiving the Services via telemedicine include:  Delay or interruption in medical evaluation due to technological equipment failure or disruption; Information transmitted may not be sufficient (e.g. poor resolution of images) to allow for appropriate medical decision making by the  Practitioner; and/or  In rare instances, security protocols could fail, causing a breach of personal health information.  Furthermore, I acknowledge that it is my responsibility to provide information about my medical history, conditions and care that is complete and accurate to the best of my ability. I acknowledge that Practitioner's advice, recommendations, and/or decision may be based on factors not within their control, such as incomplete or inaccurate data provided by me or distortions of diagnostic images or specimens that may result from electronic transmissions. I understand that the practice of medicine is not an exact science and that Practitioner makes no warranties or guarantees regarding treatment outcomes. I acknowledge that a copy of this consent can be made available to me via my patient portal [facility] MyChart), or I can request a printed copy by calling the office of  [facility].    I understand that my insurance will be billed for this visit.   I have read or had this consent read to me. I understand the contents of this consent, which adequately explains the benefits and risks of the Services being provided via telemedicine.  I have been provided ample opportunity to ask questions regarding this consent and the Services and have had my questions answered to my satisfaction. I give my informed consent for the services to be provided through the use of telemedicine in my medical care

## 2022-09-14 NOTE — Telephone Encounter (Signed)
Pt schedule for tele visit on 11/19/22. Med rec and consent done

## 2022-10-10 ENCOUNTER — Ambulatory Visit (HOSPITAL_COMMUNITY): Admission: RE | Admit: 2022-10-10 | Payer: Medicare Other | Source: Ambulatory Visit

## 2022-10-16 ENCOUNTER — Ambulatory Visit (HOSPITAL_COMMUNITY)
Admission: RE | Admit: 2022-10-16 | Discharge: 2022-10-16 | Disposition: A | Discharge status: Home / Self Care | Payer: Medicare Other | Source: Ambulatory Visit | Attending: Cardiovascular Disease | Admitting: Cardiovascular Disease

## 2022-10-16 DIAGNOSIS — I77810 Thoracic aortic ectasia: Secondary | ICD-10-CM | POA: Diagnosis present

## 2022-10-16 MED ORDER — IOHEXOL 350 MG/ML SOLN
75.0000 mL | Freq: Once | INTRAVENOUS | Status: AC | PRN
  Administered 2022-10-16: 75 mL via INTRAVENOUS

## 2022-10-23 ENCOUNTER — Telehealth: Payer: Self-pay

## 2022-10-23 DIAGNOSIS — I77810 Thoracic aortic ectasia: Secondary | ICD-10-CM

## 2022-10-23 NOTE — Telephone Encounter (Signed)
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

## 2022-10-23 NOTE — Telephone Encounter (Signed)
Order placed for repeat CTA of Aorta to be done in 1 year.

## 2022-11-19 ENCOUNTER — Encounter: Payer: Self-pay | Admitting: Nurse Practitioner

## 2022-11-19 ENCOUNTER — Ambulatory Visit: Payer: Medicare Other | Attending: Nurse Practitioner | Admitting: Nurse Practitioner

## 2022-11-19 DIAGNOSIS — Z01810 Encounter for preprocedural cardiovascular examination: Secondary | ICD-10-CM | POA: Diagnosis not present

## 2022-11-19 NOTE — Progress Notes (Signed)
Virtual Visit via Telephone Note   Because of Gabriel Tran's co-morbid illnesses, he is at least at moderate risk for complications without adequate follow up.  This format is felt to be most appropriate for this patient at this time.  The patient did not have access to video technology/had technical difficulties with video requiring transitioning to audio format only (telephone).  All issues noted in this document were discussed and addressed.  No physical exam could be performed with this format.  Please refer to the patient's chart for his consent to telehealth for [facility].  Evaluation Performed:  Preoperative cardiovascular risk assessment _____________   Date:  11/19/2022   Patient ID:  Gabriel Tran, DOB 01-Nov-1955, MRN [id number] Patient Location:  Home Provider location:   Office  Primary Care Provider:  Joaquim Nam, MD Primary Cardiologist:  Kristeen Miss, MD  Chief Complaint / Patient Profile   67 y.o. y/o male with a h/o hypertension, aortic dissection s/p repair in the setting of uncontrolled hypertension, postop complications of PEA arrest and pulmonary emboli, cryptogenic stroke, hyperlipidemia, mildly reduced LVEF with EF 45-50, mild LVH, coronary calcification on CT who is pending colonoscopy and presents today for telephonic preoperative cardiovascular risk assessment.  History of Present Illness    Gabriel Tran is a 67 y.o. male who presents via audio/video conferencing for a telehealth visit today.  Pt was last seen in cardiology clinic on 06/21/2022 by Dr. Elease Hashimoto.  At that time Gabriel Tran was having DOE. Echo completed 07/24/22 revealed mildly reduced LVEF 45 to 50%. He is now pending procedure as outlined above. Since his last visit, he reports he continues to have shortness of breath, more noticeable when he is less active. He denies chest pain, lower extremity edema, fatigue, palpitations, melena, hematuria, hemoptysis,  diaphoresis, weakness, presyncope, syncope, orthopnea, and PND. He is able to achieve > 4 METS without concerning cardiac symptoms.    Past Medical History    Past Medical History:  Diagnosis Date   Aortic dissection [facility])    Cardiac tamponade 2013   Colon polyps 2017   CVA (cerebral infarction)    HLD (hyperlipidemia)    Hyperglycemia    a. A1C 5.9 in Sept 2013.   Hypertension    PE (pulmonary embolism)    RBBB    Renal insufficiency 08/28/2012   Sleep apnea    history of   Stroke [facility])    Past Surgical History:  Procedure Laterality Date   CARPAL TUNNEL RELEASE Right 2004   COLONOSCOPY     INSERTION OF DIALYSIS CATHETER  11/06/2011   Procedure: INSERTION OF DIALYSIS CATHETER;  Surgeon: Kerin Perna, MD;  Location: [facility] OR;  Service: Thoracic;  Laterality: Right;   INSERTION OF DIALYSIS CATHETER  11/13/2011   Procedure: INSERTION OF DIALYSIS CATHETER;  Surgeon: Sherren Kerns, MD;  Location: [facility] OR;  Service: Vascular;  Laterality: N/A;  Ultrasound guided.   LEFT HEART CATHETERIZATION WITH CORONARY ANGIOGRAM N/A 10/30/2011   Procedure: LEFT HEART CATHETERIZATION WITH CORONARY ANGIOGRAM;  Surgeon: Kathleene Hazel, MD;  Location: [facility] CATH LAB;  Service: Cardiovascular;  Laterality: N/A;   LIGAMENT REPAIR Left 1981   Knee   LOOP RECORDER IMPLANT N/A 01/20/2014   MDT LINQ implanted by Dr Ladona Ridgel for cryptogenic stroke   MEDIASTINAL EXPLORATION  11/06/2011   Procedure: MEDIASTINAL EXPLORATION;  Surgeon: Kerin Perna, MD;  Location: [facility] OR;  Service: Thoracic;  Laterality: N/A;  sternal closure; removal of  wound vac   POLYPECTOMY     TEE WITHOUT CARDIOVERSION N/A 01/20/2014   Procedure: TRANSESOPHAGEAL ECHOCARDIOGRAM (TEE);  Surgeon: Donato Schultz, MD;  Location: [facility] ENDOSCOPY;  Service: Cardiovascular;  Laterality: N/A;   THORACIC AORTIC ANEURYSM REPAIR  10/30/2011   Procedure: THORACIC ASCENDING ANEURYSM REPAIR (AAA);  Surgeon: Kerin Perna, MD;  Location: [facility] OR;   Service: Open Heart Surgery;  Laterality: N/A;  Ascending aortic dissection repair, arch reconstruction, aortic valve repair   WISDOM TOOTH EXTRACTION      Allergies  Allergies  Allergen Reactions   Carvedilol     "felt terrible", headache   Lipitor [Atorvastatin]     aches    Home Medications    Prior to Admission medications   Medication Sig Start Date End Date Taking? Authorizing Provider  ALPRAZolam Prudy Feeler) 0.5 MG tablet TAKE 1 TABLET(0.5 MG) BY MOUTH TWICE DAILY AS NEEDED FOR ANXIETY 05/17/20   Joaquim Nam, MD  amLODipine (NORVASC) 5 MG tablet Take 1 tablet (5 mg total) by mouth daily. 06/08/21   Nahser, Deloris Ping, MD  chlorthalidone (HYGROTON) 25 MG tablet TAKE ONE TABLET (25MG ) BY MOUTH ONE TIME DAILY 07/09/22   Nahser, Deloris Ping, MD  Cholecalciferol (VITAMIN D3) 50 MCG (2000 UT) TABS Take by mouth. daily    [provider]  clopidogrel (PLAVIX) 75 MG tablet TAKE ONE TABLET (75MG ) BY MOUTH ONE TIME DAILY 07/09/22   Nahser, Deloris Ping, MD  cyclobenzaprine (FLEXERIL) 10 MG tablet TAKE 1/2 TO 1 TABLET BY MOUTH THREE TIMES DAILY AS NEEDED FOR MUSCLE SPASMS,( SEDATION CAUTION) 01/11/19   Joaquim Nam, MD  losartan (COZAAR) 100 MG tablet TAKE ONE TABLET BY MOUTH ONCE A DAY 09/04/22   Nahser, Deloris Ping, MD  methocarbamol (ROBAXIN) 500 MG tablet Take 1 tablet by mouth daily as needed for spasms. 12/03/17   [provider]  metoprolol tartrate (LOPRESSOR) 50 MG tablet TAKE ONE TABLET BY MOUTH TWICE DAILY 08/20/22   Nahser, Deloris Ping, MD  Multiple Vitamin (MULTIVITAMIN WITH MINERALS) TABS tablet Take 1 tablet by mouth 2 (two) times daily.     [provider]  potassium chloride (KLOR-CON) 10 MEQ tablet TAKE ONE TABLET (10 MEQ TOTAL) BY MOUTH ONE TIME DAILY 07/25/22   Nahser, Deloris Ping, MD  rosuvastatin (CRESTOR) 10 MG tablet TAKE ONE TABLET BY MOUTH ONE TIME DAILY 08/20/22   Nahser, Deloris Ping, MD  traMADol (ULTRAM) 50 MG tablet Take 1 tablet by mouth daily as needed for  pain. MAX OF 4 TABLETS PER DAY 12/03/17   [provider]    Physical Exam    Vital Signs:  Gabriel Tran does not have vital signs available for review today.  Given telephonic nature of communication, physical exam is limited. AAOx3. NAD. Normal affect.  Speech and respirations are unlabored.  Accessory Clinical Findings    None  Assessment & Plan    1.  Preoperative Cardiovascular Risk Assessment: According to the Revised Cardiac Risk Index (RCRI), his Perioperative Risk of Major Cardiac Event is (%): 6.6. His Functional Capacity in METs is: 7.59 according to the Duke Activity Status Index (DASI). The patient is doing well from a cardiac perspective. Therefore, based on ACC/AHA guidelines, the patient would be at acceptable risk for the planned procedure without further cardiovascular testing. I will route clearance to requesting provider.   The patient was advised that if he develops new symptoms prior to surgery to contact our office to arrange for a follow-up visit, and  he verbalized understanding.  Per office protocol, he may hold Plavix for 5-7 days prior to procedure and should resume as soon as hemodynamically stable postoperatively.   A copy of this note will be routed to requesting surgeon.  Time:   Today, I have spent 10 minutes with the patient with telehealth technology discussing medical history, symptoms, and management plan.    Levi Aland, NP-C  11/19/2022, 1:41 PM [address] Office [phone] Fax [phone]

## 2022-12-04 ENCOUNTER — Telehealth: Payer: Self-pay | Admitting: Gastroenterology

## 2022-12-04 NOTE — Telephone Encounter (Signed)
This patient was seen in the office on 09/12/2022 to plan a surveillance colonoscopy that is scheduled for 12/17/2022 in the [facility].  We were awaiting a follow-up cardiology evaluation, which took place on 11/19/2022.  That provider's note indicates that this patient can proceed with his colonoscopy as scheduled without further cardiac testing, and with the planned 5-day Plavix hold. I also see that the CT angiogram of the chest to follow-up on his aortic aneurysm was stable and reviewed by his cardiologist.  Please call this patient and let him know to keep the procedure appointment as scheduled and hold Plavix 5 days before as instructed.  Thank you  - H. Myrtie Neither, MD

## 2022-12-05 ENCOUNTER — Ambulatory Visit (AMBULATORY_SURGERY_CENTER): Payer: Medicare Other

## 2022-12-05 VITALS — Ht 66.0 in | Wt 220.0 lb

## 2022-12-05 DIAGNOSIS — Z860100 Personal history of colon polyps, unspecified: Secondary | ICD-10-CM

## 2022-12-05 MED ORDER — NA SULFATE-K SULFATE-MG SULF 17.5-3.13-1.6 GM/177ML PO SOLN: 1.0000 | Freq: Once | ORAL | 0 refills | Status: AC

## 2022-12-05 NOTE — Progress Notes (Signed)
No egg or soy allergy known to patient  No issues known to pt with past sedation with any surgeries or procedures Patient denies ever being told they had issues or difficulty with intubation  No FH of Malignant Hyperthermia Pt is not on diet pills Pt is not on  home 02  Pt is not on blood thinners  Pt denies issues with constipation  No A fib or A flutter pt does report hx of palpitations  Have any cardiac testing pending--no routine annual visit   LOA: independent  Prep: suprep   Patient's chart reviewed by Cathlyn Parsons CNRA prior to previsit and patient appropriate for the [facility].  Previsit completed and red dot placed by patient's name on their procedure day (on provider's schedule).     PV competed with patient. Prep instructions sent via mychart and home address. Goodrx coupon for [employer] provided to use for price reduction if needed.

## 2022-12-10 ENCOUNTER — Encounter: Payer: Self-pay | Admitting: Certified Registered Nurse Anesthetist

## 2022-12-13 ENCOUNTER — Encounter: Payer: Self-pay | Admitting: Gastroenterology

## 2022-12-13 ENCOUNTER — Ambulatory Visit: Payer: Medicare Other | Admitting: Gastroenterology

## 2022-12-13 VITALS — BP 171/61 | HR 50 | Temp 98.1°F | Resp 12 | Ht 66.0 in | Wt 220.0 lb

## 2022-12-13 DIAGNOSIS — Z860101 Personal history of adenomatous and serrated colon polyps: Secondary | ICD-10-CM

## 2022-12-13 DIAGNOSIS — Z860100 Personal history of colon polyps, unspecified: Secondary | ICD-10-CM

## 2022-12-13 DIAGNOSIS — D122 Benign neoplasm of ascending colon: Secondary | ICD-10-CM

## 2022-12-13 DIAGNOSIS — D123 Benign neoplasm of transverse colon: Secondary | ICD-10-CM

## 2022-12-13 DIAGNOSIS — Z09 Encounter for follow-up examination after completed treatment for conditions other than malignant neoplasm: Secondary | ICD-10-CM | POA: Diagnosis not present

## 2022-12-13 MED ORDER — SODIUM CHLORIDE 0.9 % IV SOLN: 500.0000 mL | Freq: Once | INTRAVENOUS | Status: DC

## 2022-12-13 NOTE — Progress Notes (Signed)
History and Physical:  This patient presents for endoscopic testing for: Encounter Diagnosis  Name Primary?   Hx of colonic polyps Yes    CLinical Hx in 09/12/22 office consult note, without significant clinical changes since then.  Surveillance colonoscopy today for 2-3 SubCM TA polyps in 2017 Plavix held last 5 days  Patient is otherwise without complaints or active issues today.   Past Medical History: Past Medical History:  Diagnosis Date   Aortic dissection (HCC)    Cardiac tamponade 2013   Colon polyps 2017   CVA (cerebral infarction)    HLD (hyperlipidemia)    Hyperglycemia    a. A1C 5.9 in Sept 2013.   Hypertension    PE (pulmonary embolism)    RBBB    Renal insufficiency 08/28/2012   Sleep apnea    history of     Past Surgical History: Past Surgical History:  Procedure Laterality Date   CARPAL TUNNEL RELEASE Right 2004   COLONOSCOPY     INSERTION OF DIALYSIS CATHETER  11/06/2011   Procedure: INSERTION OF DIALYSIS CATHETER;  Surgeon: Kerin Perna, MD;  Location: [facility] OR;  Service: Thoracic;  Laterality: Right;   INSERTION OF DIALYSIS CATHETER  11/13/2011   Procedure: INSERTION OF DIALYSIS CATHETER;  Surgeon: Sherren Kerns, MD;  Location: [facility] OR;  Service: Vascular;  Laterality: N/A;  Ultrasound guided.   LEFT HEART CATHETERIZATION WITH CORONARY ANGIOGRAM N/A 10/30/2011   Procedure: LEFT HEART CATHETERIZATION WITH CORONARY ANGIOGRAM;  Surgeon: Kathleene Hazel, MD;  Location: [facility] CATH LAB;  Service: Cardiovascular;  Laterality: N/A;   LIGAMENT REPAIR Left 1981   Knee   LOOP RECORDER IMPLANT N/A 01/20/2014   MDT LINQ implanted by Dr Ladona Ridgel for cryptogenic stroke   MEDIASTINAL EXPLORATION  11/06/2011   Procedure: MEDIASTINAL EXPLORATION;  Surgeon: Kerin Perna, MD;  Location: [facility] OR;  Service: Thoracic;  Laterality: N/A;  sternal closure; removal of wound vac   POLYPECTOMY     TEE WITHOUT CARDIOVERSION N/A 01/20/2014   Procedure: TRANSESOPHAGEAL  ECHOCARDIOGRAM (TEE);  Surgeon: Donato Schultz, MD;  Location: [facility] ENDOSCOPY;  Service: Cardiovascular;  Laterality: N/A;   THORACIC AORTIC ANEURYSM REPAIR  10/30/2011   Procedure: THORACIC ASCENDING ANEURYSM REPAIR (AAA);  Surgeon: Kerin Perna, MD;  Location: [facility] OR;  Service: Open Heart Surgery;  Laterality: N/A;  Ascending aortic dissection repair, arch reconstruction, aortic valve repair   WISDOM TOOTH EXTRACTION      Allergies: Allergies  Allergen Reactions   Carvedilol     "felt terrible", headache   Lipitor [Atorvastatin]     aches    Outpatient Meds: Current Outpatient Medications  Medication Sig Dispense Refill   aspirin 81 MG chewable tablet Chew 81 mg by mouth daily.     chlorthalidone (HYGROTON) 25 MG tablet TAKE ONE TABLET (25MG ) BY MOUTH ONE TIME DAILY 90 tablet 1   Cholecalciferol (VITAMIN D3) 50 MCG (2000 UT) TABS Take by mouth. daily     losartan (COZAAR) 100 MG tablet TAKE ONE TABLET BY MOUTH ONCE A DAY 90 tablet 2   metoprolol tartrate (LOPRESSOR) 50 MG tablet TAKE ONE TABLET BY MOUTH TWICE DAILY 180 tablet 3   Multiple Vitamin (MULTIVITAMIN WITH MINERALS) TABS tablet Take 1 tablet by mouth 2 (two) times daily.      potassium chloride (KLOR-CON) 10 MEQ tablet TAKE ONE TABLET (10 MEQ TOTAL) BY MOUTH ONE TIME DAILY 90 tablet 3   rosuvastatin (CRESTOR) 10 MG tablet TAKE ONE TABLET BY MOUTH ONE TIME DAILY  90 tablet 3   ALPRAZolam (XANAX) 0.5 MG tablet TAKE 1 TABLET(0.5 MG) BY MOUTH TWICE DAILY AS NEEDED FOR ANXIETY 30 tablet 1   amLODipine (NORVASC) 5 MG tablet Take 1 tablet (5 mg total) by mouth daily. 90 tablet 3   clopidogrel (PLAVIX) 75 MG tablet TAKE ONE TABLET (75MG ) BY MOUTH ONE TIME DAILY 90 tablet 1   cyclobenzaprine (FLEXERIL) 10 MG tablet TAKE 1/2 TO 1 TABLET BY MOUTH THREE TIMES DAILY AS NEEDED FOR MUSCLE SPASMS,( SEDATION CAUTION) 30 tablet 0   methocarbamol (ROBAXIN) 500 MG tablet Take 1 tablet by mouth daily as needed for spasms. (Patient not taking:  Reported on 12/13/2022)  0   traMADol (ULTRAM) 50 MG tablet Take 1 tablet by mouth daily as needed for pain. MAX OF 4 TABLETS PER DAY  0   Current Facility-Administered Medications  Medication Dose Route Frequency Provider Last Rate Last Admin   0.9 %  sodium chloride infusion  500 mL Intravenous Once Danis, Starr Lake III, MD          ___________________________________________________________________ Objective   Exam:  BP (!) 153/62   Pulse (!) 44   Temp 98.1 F (36.7 C)   Ht 5\' 6"  (1.676 m)   Wt 220 lb (99.8 kg)   SpO2 97%   BMI 35.51 kg/m   CV: regular , S1/S2 Resp: clear to auscultation bilaterally, normal RR and effort noted GI: soft, no tenderness, with active bowel sounds.   Assessment: Encounter Diagnosis  Name Primary?   Hx of colonic polyps Yes     Plan: Colonoscopy   The benefits and risks of the planned procedure were described in detail with the patient or (when appropriate) their health care proxy.  Risks were outlined as including, but not limited to, bleeding, infection, perforation, adverse medication reaction leading to cardiac or pulmonary decompensation, pancreatitis (if ERCP).  The limitation of incomplete mucosal visualization was also discussed.  No guarantees or warranties were given.  The patient is appropriate for an endoscopic procedure in the ambulatory setting.   - Amada Jupiter, MD

## 2022-12-13 NOTE — Op Note (Signed)
[facility] Patient Name: Gabriel Tran Procedure Date: 12/13/2022 9:07 AM MRN: [id number] Endoscopist: Sherilyn Cooter L. Myrtie Neither , MD, [id number] Age: 67 Referring MD:  Date of Birth: 10/27/1955 Gender: Male Account #: [id number] Procedure:                Colonoscopy Indications:              Surveillance: Personal history of adenomatous                            polyps on last colonoscopy > 5 years ago                           Diminutive tubular adenoma x 2 in 2017 Medicines:                Monitored Anesthesia Care Procedure:                Pre-Anesthesia Assessment:                           - Prior to the procedure, a History and Physical                            was performed, and patient medications and                            allergies were reviewed. The patient's tolerance of                            previous anesthesia was also reviewed. The risks                            and benefits of the procedure and the sedation                            options and risks were discussed with the patient.                            All questions were answered, and informed consent                            was obtained. Prior Anticoagulants: The patient has                            taken Plavix (clopidogrel), last dose was 5 days                            prior to procedure. ASA Grade Assessment: III - A                            patient with severe systemic disease. After                            reviewing the risks and benefits, the patient was  deemed in satisfactory condition to undergo the                            procedure.                           After obtaining informed consent, the colonoscope                            was passed under direct vision. Throughout the                            procedure, the patient's blood pressure, pulse, and                            oxygen saturations were monitored continuously. The                             Olympus CF-HQ190L ([id number]) Colonoscope was                            introduced through the anus and advanced to the the                            cecum, identified by appendiceal orifice and                            ileocecal valve. The colonoscopy was somewhat                            difficult due to a redundant colon. Successful                            completion of the procedure was aided by using                            manual pressure and straightening and shortening                            the scope to obtain bowel loop reduction. The                            patient tolerated the procedure well. The quality                            of the bowel preparation was excellent. The                            ileocecal valve, appendiceal orifice, and rectum                            were photographed. Scope In: 9:12:24 AM Scope Out: 9:28:35 AM Scope Withdrawal Time: 0 hours 10 minutes 38 seconds  Total Procedure Duration: 0 hours 16 minutes 11 seconds  Findings:  The perianal and digital rectal examinations were                            normal.                           Repeat examination of right colon under NBI                            performed.                           Two sessile polyps were found in the transverse                            colon and ascending colon. The polyps were                            diminutive in size. These polyps were removed with                            a cold snare. Resection and retrieval were complete.                           Internal hemorrhoids were found. The hemorrhoids                            were small.                           The exam was otherwise without abnormality on                            direct and retroflexion views. Complications:            No immediate complications. Estimated Blood Loss:     Estimated blood loss was minimal. Impression:               -  Two diminutive polyps in the transverse colon and                            in the ascending colon, removed with a cold snare.                            Resected and retrieved.                           - Internal hemorrhoids.                           - The examination was otherwise normal on direct                            and retroflexion views. Recommendation:           - Patient has a contact number available for  emergencies. The signs and symptoms of potential                            delayed complications were discussed with the                            patient. Return to normal activities tomorrow.                            Written discharge instructions were provided to the                            patient.                           - Resume previous diet.                           - Resume Plavix (clopidogrel) at prior dose                            tomorrow.                           - Await pathology results.                           - Repeat colonoscopy is recommended for                            surveillance. The colonoscopy date will be                            determined after pathology results from today's                            exam become available for review. Reo Portela L. Myrtie Neither, MD 12/13/2022 9:32:23 AM This report has been signed electronically.

## 2022-12-13 NOTE — Progress Notes (Signed)
Called to room to assist during endoscopic procedure.  Patient ID and intended procedure confirmed with present staff. Received instructions for my participation in the procedure from the performing physician.  

## 2022-12-13 NOTE — Patient Instructions (Signed)
-   Resume previous diet. - Resume Plavix (clopidogrel) at prior dose tomorrow. - Await pathology results. - Repeat colonoscopy is recommended for surveillance. The colonoscopy date will be determined after pathology results from today's exam become available for review.   YOU HAD AN ENDOSCOPIC PROCEDURE TODAY AT THE [facility]:   Refer to the procedure report that was given to you for any specific questions about what was found during the examination.  If the procedure report does not answer your questions, please call your gastroenterologist to clarify.  If you requested that your care partner not be given the details of your procedure findings, then the procedure report has been included in a sealed envelope for you to review at your convenience later.  YOU SHOULD EXPECT: Some feelings of bloating in the abdomen. Passage of more gas than usual.  Walking can help get rid of the air that was put into your GI tract during the procedure and reduce the bloating. If you had a lower endoscopy (such as a colonoscopy or flexible sigmoidoscopy) you may notice spotting of blood in your stool or on the toilet paper. If you underwent a bowel prep for your procedure, you may not have a normal bowel movement for a few days.  Please Note:  You might notice some irritation and congestion in your nose or some drainage.  This is from the oxygen used during your procedure.  There is no need for concern and it should clear up in a day or so.  SYMPTOMS TO REPORT IMMEDIATELY:  Following lower endoscopy (colonoscopy or flexible sigmoidoscopy):  Excessive amounts of blood in the stool  Significant tenderness or worsening of abdominal pains  Swelling of the abdomen that is new, acute  Fever of 100F or higher  For urgent or emergent issues, a gastroenterologist can be reached at any hour by calling [phone]. Do not use MyChart messaging for urgent concerns.    DIET:  We do recommend a small meal  at first, but then you may proceed to your regular diet.  Drink plenty of fluids but you should avoid alcoholic beverages for 24 hours.  ACTIVITY:  You should plan to take it easy for the rest of today and you should NOT DRIVE or use heavy machinery until tomorrow (because of the sedation medicines used during the test).    FOLLOW UP: Our staff will call the number listed on your records the next business day following your procedure.  We will call around 7:15- 8:00 am to check on you and address any questions or concerns that you may have regarding the information given to you following your procedure. If we do not reach you, we will leave a message.     If any biopsies were taken you will be contacted by phone or by letter within the next 1-3 weeks.  Please call us at [phone] if you have not heard about the biopsies in 3 weeks.    SIGNATURES/CONFIDENTIALITY: You and/or your care partner have signed paperwork which will be entered into your electronic medical record.  These signatures attest to the fact that that the information above on your After Visit Summary has been reviewed and is understood.  Full responsibility of the confidentiality of this discharge information lies with you and/or your care-partner.

## 2022-12-13 NOTE — Progress Notes (Signed)
Report given to PACU, vss 

## 2022-12-14 ENCOUNTER — Telehealth: Payer: Self-pay

## 2022-12-14 NOTE — Telephone Encounter (Signed)
  Follow up Call-     12/13/2022    8:12 AM  Call back number  Post procedure Call Back phone  # [phone]  Permission to leave phone message Yes     Patient questions:  Do you have a fever, pain , or abdominal swelling? No. Pain Score  0 *  Have you tolerated food without any problems? Yes.    Have you been able to return to your normal activities? Yes.    Do you have any questions about your discharge instructions: Diet   No. Medications  No. Follow up visit  No.  Do you have questions or concerns about your Care? No.  Actions: * If pain score is 4 or above: No action needed, pain <4.

## 2022-12-17 ENCOUNTER — Encounter: Payer: Self-pay | Admitting: Gastroenterology

## 2022-12-17 LAB — SURGICAL PATHOLOGY

## 2023-01-09 ENCOUNTER — Other Ambulatory Visit: Payer: Self-pay | Admitting: Cardiovascular Disease

## 2023-03-08 ENCOUNTER — Telehealth (INDEPENDENT_AMBULATORY_CARE_PROVIDER_SITE_OTHER): Payer: Medicare Other | Admitting: Nurse Practitioner

## 2023-03-08 ENCOUNTER — Ambulatory Visit: Payer: Self-pay | Admitting: Family Medicine

## 2023-03-08 DIAGNOSIS — R002 Palpitations: Secondary | ICD-10-CM | POA: Diagnosis not present

## 2023-03-08 NOTE — Assessment & Plan Note (Signed)
Acute, intermittent I am concerned that his symptoms may represent either arrhythmia such as atrial fibrillation, acute heart failure, or pulmonary embolism.  I recommended patient proceed to the emergency department, he has refused.  I think it is less likely he has respiratory infection due to absence of fever, congestion, sinus pain.  We did discuss that if he does not go to the emergency department and if he is experiencing a life-threatening etiology he could have poor outcomes up to and including death.  I also discussed common symptoms and signs of atrial fibrillation, heart failure, and pulmonary malaise him and that he has multiple risk factors including symptoms of these currently which is why I am quite concerned.  We discussed that the limitation of a video visit is that I am unable to do EKG, chest imaging, point-of-care viral testing, etc. He had reported his understanding.  He would prefer to see a provider in person on Monday, I was able to get him scheduled to see his PCP Monday morning at 10 AM.  He was again highly recommended to go to the emergency department and if he gets any worse over the weekend should really consider going to the emergency department.  He tells me he understands.

## 2023-03-08 NOTE — Telephone Encounter (Signed)
  Chief Complaint: Cough Symptoms: Wheezing, cough, SOB with exertion Frequency: Ongoing x 1 week Pertinent Negatives: Patient denies fever, N/V Disposition: [] ED /[] Urgent Care (no appt availability in office) / [x] Appointment(In office/virtual)/ []  [facility]/ [] Home Care/ [] Refused Recommended Disposition /[] [facility]/ []  Follow-up with PCP Additional Notes: Pt reports cough, wheezing, SOB with exertion x 1 week. Denies fever, CP. Virtual OV scheduled today. This RN educated pt on home care, new-worsening symptoms, when to call back/seek emergent care. Pt verbalized understanding and agrees to plan.    Copied from CRM [id number]. Topic: Clinical - Red Word Triage >> Mar 08, 2023  4:12 PM Shelbie Proctor wrote: Red Word that prompted transfer to Nurse Triage: Patient [phone] thinks has lung infection or walking pneumonia, wheezing, cough, feels can't get lung full of air, shortness of breath, and light headed. Patient denies a fever. Reason for Disposition  Wheezing is present  Answer Assessment - Initial Assessment Questions 1. ONSET: "When did the cough begin?"      One week ago  3. SPUTUM: "Describe the color of your sputum" (none, dry cough; clear, white, yellow, green)     None 4. HEMOPTYSIS: "Are you coughing up any blood?" If so ask: "How much?" (flecks, streaks, tablespoons, etc.)     None 5. DIFFICULTY BREATHING: "Are you having difficulty breathing?" If Yes, ask: "How bad is it?" (e.g., mild, moderate, severe)    - MILD: No SOB at rest, mild SOB with walking, speaks normally in sentences, can lie down, no retractions, pulse < 100.    - MODERATE: SOB at rest, SOB with minimal exertion and prefers to sit, cannot lie down flat, speaks in phrases, mild retractions, audible wheezing, pulse 100-120.    - SEVERE: Very SOB at rest, speaks in single words, struggling to breathe, sitting hunched forward, retractions, pulse > 120      SOB with exertion, bending 6.  FEVER: "Do you have a fever?" If Yes, ask: "What is your temperature, how was it measured, and when did it start?"     None  10. OTHER SYMPTOMS: "Do you have any other symptoms?" (e.g., runny nose, wheezing, chest pain)       Wheezing, weak, congestion  Protocols used: Cough - Acute Non-Productive-A-AH

## 2023-03-08 NOTE — Progress Notes (Signed)
Established Patient Office Visit  An audio/visual tele-health visit was completed today for this patient. I connected with  Gabriel Tran on 03/08/23 utilizing audio/visual technology and verified that I am speaking with the correct person using two identifiers. The patient was located at their home, and I was located at the office of [facility] at [facility] ([place]) during the encounter. I discussed the limitations of evaluation and management by telemedicine. The patient expressed understanding and agreed to proceed.     Subjective   Patient ID: Gabriel Tran, male    DOB: 1955-11-12  Age: 68 y.o. MRN: [id number]  Chief Complaint  Patient presents with   Cough    Been going on for about a week, dry cough, chest congestion. No body ache or headache but fatigue    Patient arrives today for virtual visit for the above.  Symptom onset 1 week ago.  He reports he has been experiencing dry cough, orthopnea, exertional fatigue, and cardiac palpitations.  He denies any leg swelling or weight gain.  He does report having to use extra pillows at night but still has a hard time sleeping due to the orthopnea.  He has a history of atrial fibrillation in 2015 but was able to convert to sinus rhythm.  He underwent loop recording for a period of time, but reports this is no longer being monitored.  He also has a history of pulmonary embolism and stroke.  He is concerned he may have respiratory infection, but denies chills, fever, congestion, sinus pain, or productive cough.  He does endorse wheezing.  Per chart review I see no history of asthma or other respiratory chronic disease.    Review of Systems  Constitutional:  Positive for malaise/fatigue. Negative for chills and fever.  HENT:  Negative for congestion and sinus pain.   Respiratory:  Positive for cough, shortness of breath and wheezing. Negative for sputum production.   Cardiovascular:  Positive for palpitations and orthopnea.  Negative for chest pain and leg swelling.      Objective:     There were no vitals taken for this visit.  Patient did not have vital signs available for visit today.   Physical Exam Comprehensive physical exam not completed today as office visit was conducted remotely.  No acute respiratory distress identified over video..  Patient was alert and oriented, and appeared to have appropriate judgment.   No results found for any visits on 03/08/23.    The ASCVD Risk score (Arnett DK, et al., 2019) failed to calculate for the following reasons:   Risk score cannot be calculated because patient has a medical history suggesting prior/existing ASCVD    Assessment & Plan:   Problem List Items Addressed This Visit       Other   Palpitations - Primary   Acute, intermittent I am concerned that his symptoms may represent either arrhythmia such as atrial fibrillation, acute heart failure, or pulmonary embolism.  I recommended patient proceed to the emergency department, he has refused.  I think it is less likely he has respiratory infection due to absence of fever, congestion, sinus pain.  We did discuss that if he does not go to the emergency department and if he is experiencing a life-threatening etiology he could have poor outcomes up to and including death.  I also discussed common symptoms and signs of atrial fibrillation, heart failure, and pulmonary malaise him and that he has multiple risk factors including symptoms of these currently which  is why I am quite concerned.  We discussed that the limitation of a video visit is that I am unable to do EKG, chest imaging, point-of-care viral testing, etc. He had reported his understanding.  He would prefer to see a provider in person on Monday, I was able to get him scheduled to see his PCP Monday morning at 10 AM.  He was again highly recommended to go to the emergency department and if he gets any worse over the weekend should really consider going  to the emergency department.  He tells me he understands.       Return for As scheduled with PCP on Monday.    Elenore Paddy, NP

## 2023-03-08 NOTE — Telephone Encounter (Signed)
Patient being seen this afternoon at 5:00 pm via virtual mychart visit.

## 2023-03-10 NOTE — Telephone Encounter (Signed)
 Noted. Thanks.

## 2023-03-11 ENCOUNTER — Encounter: Payer: Self-pay | Admitting: Family Medicine

## 2023-03-11 ENCOUNTER — Ambulatory Visit (INDEPENDENT_AMBULATORY_CARE_PROVIDER_SITE_OTHER): Payer: Medicare Other | Admitting: Family Medicine

## 2023-03-11 ENCOUNTER — Telehealth: Payer: Self-pay | Admitting: Cardiovascular Disease

## 2023-03-11 VITALS — BP 144/82 | HR 56 | Temp 98.2°F | Ht 66.0 in | Wt 222.2 lb

## 2023-03-11 DIAGNOSIS — R0989 Other specified symptoms and signs involving the circulatory and respiratory systems: Secondary | ICD-10-CM

## 2023-03-11 DIAGNOSIS — R0602 Shortness of breath: Secondary | ICD-10-CM | POA: Diagnosis not present

## 2023-03-11 LAB — COMPREHENSIVE METABOLIC PANEL
ALT: 41 U/L (ref 0–53)
AST: 23 U/L (ref 0–37)
Albumin: 4.2 g/dL (ref 3.5–5.2)
Alkaline Phosphatase: 49 U/L (ref 39–117)
BUN: 20 mg/dL (ref 6–23)
CO2: 28 meq/L (ref 19–32)
Calcium: 9.7 mg/dL (ref 8.4–10.5)
Chloride: 95 meq/L — ABNORMAL LOW (ref 96–112)
Creatinine, Ser: 1.21 mg/dL (ref 0.40–1.50)
GFR: 62.87 mL/min (ref >60.00)
Glucose, Bld: 124 mg/dL — ABNORMAL HIGH (ref 70–99)
Potassium: 3.8 meq/L (ref 3.5–5.1)
Sodium: 133 meq/L — ABNORMAL LOW (ref 135–145)
Total Bilirubin: 1.3 mg/dL — ABNORMAL HIGH (ref 0.2–1.2)
Total Protein: 7 g/dL (ref 6.0–8.3)

## 2023-03-11 LAB — CBC WITH DIFFERENTIAL/PLATELET
Basophils Absolute: 0 10*3/uL (ref 0.0–0.1)
Basophils Relative: 0.5 % (ref 0.0–3.0)
Eosinophils Absolute: 0.2 10*3/uL (ref 0.0–0.7)
Eosinophils Relative: 1.9 % (ref 0.0–5.0)
HCT: 45.6 % (ref 39.0–52.0)
Hemoglobin: 15.4 g/dL (ref 13.0–17.0)
Lymphocytes Relative: 14.7 % (ref 12.0–46.0)
Lymphs Abs: 1.3 10*3/uL (ref 0.7–4.0)
MCHC: 33.7 g/dL (ref 30.0–36.0)
MCV: 93.5 fL (ref 78.0–100.0)
Monocytes Absolute: 0.8 10*3/uL (ref 0.1–1.0)
Monocytes Relative: 9.5 % (ref 3.0–12.0)
Neutro Abs: 6.4 10*3/uL (ref 1.4–7.7)
Neutrophils Relative %: 73.4 % (ref 43.0–77.0)
Platelets: 157 10*3/uL (ref 150.0–400.0)
RBC: 4.88 Mil/uL (ref 4.22–5.81)
RDW: 13.9 % (ref 11.5–15.5)
WBC: 8.7 10*3/uL (ref 4.0–10.5)

## 2023-03-11 LAB — BRAIN NATRIURETIC PEPTIDE: Pro B Natriuretic peptide (BNP): 1377 pg/mL — ABNORMAL HIGH (ref 0.0–100.0)

## 2023-03-11 LAB — TSH: TSH: 1.71 u[IU]/mL (ref 0.35–5.50)

## 2023-03-11 MED ORDER — FUROSEMIDE 20 MG PO TABS: 20.0000 mg | ORAL_TABLET | Freq: Every day | ORAL | 0 refills | Status: DC | PRN

## 2023-03-11 NOTE — Patient Instructions (Signed)
Go to the lab on the way out.   If you have mychart we'll likely use that to update you.    Take a dose of lasix today.   Call cardiology about getting seen.  If any worse, then go to the ER.  Take care.  Glad to see you.

## 2023-03-11 NOTE — Progress Notes (Signed)
 Cardiology Office Note    Date:  03/12/2023   ID:  Tran Gabriel, DOB 29-Aug-1955, MRN [id number]  PCP:  Gabriel Arlyss RAMAN, MD  Cardiologist:  Aleene Passe, MD  Electrophysiologist:  None   Chief Complaint: Dyspnea  History of Present Illness:   Gabriel Tran is a 68 y.o. male with history of CAD noted on imaging, HFmrEF, ascending aortic dissection in the setting of uncontrolled hypertension status post repair complicated by tamponade with PEA arrest and PE in 2013, cryptogenic CVA, HTN, HLD, and prior tobacco use who presents for evaluation of progressive dyspnea.  He underwent initial emergent ascending thoracic aortic dissection repair on 10/30/2011, in the setting of uncontrolled hypertension.  Postop course was complicated by tamponade/hematoma evacuation on 11/02/2012 with PEA arrest and PE.  LHC showed no evidence of obstructive CAD.  Hospital course was also complicated by renal failure requiring dialysis.  He was admitted in 2015 with left brain stroke with right hemiparesis.  Initial blood pressure of 200/115.  He was treated with tPA.  MRI showed a left frontal middle cerebral artery branch infarct felt to be possibly embolic.  Surface echo showed an EF of 60 to 65%, no regional wall motion neurologist, and mild aortic insufficiency.  He was discharged on clopidogrel .  He subsequently underwent TEE that showed no evidence of emboli.  ILR was implanted that showed no evidence of arrhythmia.  He was last seen in 06/2022 noting exertional dyspnea.  Echo from 07/2022 showed an EF of 45 to 50%, inferior hypokinesis, mild LVH, normal LV diastolic function parameters, mildly reduced RV systolic function mildly enlarged ventricular cavity size and normal RVSP, moderate biatrial enlargement, trivial mitral regurgitation, mild to moderate aortic insufficiency, dilated aortic root measuring 43 mm, and an estimated right atrial pressure of 3 mmHg.  CTA chest/aorta in 10/2022 showed stable  appearance of previously visualized chronic type B thoracoabdominal aortic dissection as well as similar appearing postsurgical changes after a ascending aortic tube graft repair without complicating features.  Slight interval dilatation of the aortic root with the sinuses of Valsalva measuring up to 4 cm (previously 3.8 cm), mild global cardiomegaly, and moderate three-vessel coronary artery calcification.  He was seen at his PCP's office on 03/11/2023 and reported progressive dyspnea and orthopnea with associated palpitations that began around 02/22/2023.  He was advised to take a dose of Lasix  20 mg.  Labs obtained at that time showed a BNP of 1377.  EKG obtained at that time demonstrated sinus rhythm with a known right bundle branch block with occasional PVCs.  He comes in today noting shortness of breath that initially began in 01/2022 that was relatively stable up until the past several weeks.  Approximately 2 to 3 weeks ago he developed significant shortness of breath with exertional fatigue without frank chest pain.  In this setting he has noted progressive orthopnea, now sleeping on 2 pillows and at times having to sit up on the edge of the bed or go lay down on the couch.  No significant lower extremity swelling.  He does feel like his abdomen is more distended and indicates he is able to move side-to-side and hear a sloshing.  Weight has been largely stable around 220 pounds throughout the above.  He does report good urine output with initiated furosemide  20 mg yesterday.  He eats out at [employer] approximately 2 to 3 days/week.  Drinks less than 2 L of liquid daily.  He has also noted some intermittent  palpitations throughout the above.  Weight by our scale today is 217, down 5 pounds from PCP scale on 03/11/23.  Last weight at our [facility] cardiology office in 06/2022 of 220 pounds.   Labs independently reviewed: 03/2023 - TSH normal, potassium 3.8, BUN 20, serum creatinine 1.21,  albumin  4.2, AST/ALT normal, Hgb 15.4, PLT 157 06/2022 - TC 170, TG 106, HDL 67, LDL 92  Past Medical History:  Diagnosis Date   Aortic dissection (HCC)    Cardiac tamponade 2013   Colon polyps 2017   CVA (cerebral infarction)    HLD (hyperlipidemia)    Hyperglycemia    a. A1C 5.9 in Sept 2013.   Hypertension    PE (pulmonary embolism)    RBBB    Renal insufficiency 08/28/2012   Sleep apnea    history of    Past Surgical History:  Procedure Laterality Date   CARPAL TUNNEL RELEASE Right 2004   COLONOSCOPY     INSERTION OF DIALYSIS CATHETER  11/06/2011   Procedure: INSERTION OF DIALYSIS CATHETER;  Surgeon: Maude Fleeta Ochoa, MD;  Location: [facility] OR;  Service: Thoracic;  Laterality: Right;   INSERTION OF DIALYSIS CATHETER  11/13/2011   Procedure: INSERTION OF DIALYSIS CATHETER;  Surgeon: Carlin FORBES Haddock, MD;  Location: [facility] OR;  Service: Vascular;  Laterality: N/A;  Ultrasound guided.   LEFT HEART CATHETERIZATION WITH CORONARY ANGIOGRAM N/A 10/30/2011   Procedure: LEFT HEART CATHETERIZATION WITH CORONARY ANGIOGRAM;  Surgeon: Lonni JONETTA Cash, MD;  Location: [facility] CATH LAB;  Service: Cardiovascular;  Laterality: N/A;   LIGAMENT REPAIR Left 1981   Knee   LOOP RECORDER IMPLANT N/A 01/20/2014   MDT LINQ implanted by Dr Waddell for cryptogenic stroke   MEDIASTINAL EXPLORATION  11/06/2011   Procedure: MEDIASTINAL EXPLORATION;  Surgeon: Maude Fleeta Ochoa, MD;  Location: [facility] OR;  Service: Thoracic;  Laterality: N/A;  sternal closure; removal of wound vac   POLYPECTOMY     TEE WITHOUT CARDIOVERSION N/A 01/20/2014   Procedure: TRANSESOPHAGEAL ECHOCARDIOGRAM (TEE);  Surgeon: Oneil Parchment, MD;  Location: [facility] ENDOSCOPY;  Service: Cardiovascular;  Laterality: N/A;   THORACIC AORTIC ANEURYSM REPAIR  10/30/2011   Procedure: THORACIC ASCENDING ANEURYSM REPAIR (AAA);  Surgeon: Maude Fleeta Ochoa, MD;  Location: [facility] OR;  Service: Open Heart Surgery;  Laterality: N/A;  Ascending aortic dissection repair, arch  reconstruction, aortic valve repair   WISDOM TOOTH EXTRACTION      Current Medications: Current Meds  Medication Sig   ALPRAZolam  (XANAX ) 0.5 MG tablet TAKE 1 TABLET(0.5 MG) BY MOUTH TWICE DAILY AS NEEDED FOR ANXIETY   aspirin  81 MG chewable tablet Chew 81 mg by mouth daily.   Cholecalciferol (VITAMIN D3) 50 MCG (2000 UT) TABS Take by mouth. daily   cyclobenzaprine  (FLEXERIL ) 10 MG tablet TAKE 1/2 TO 1 TABLET BY MOUTH THREE TIMES DAILY AS NEEDED FOR MUSCLE SPASMS,( SEDATION CAUTION)   losartan  (COZAAR ) 100 MG tablet TAKE ONE TABLET BY MOUTH ONCE A DAY   methocarbamol (ROBAXIN) 500 MG tablet Take 1 tablet by mouth daily as needed for spasms.   metoprolol  tartrate (LOPRESSOR ) 50 MG tablet TAKE ONE TABLET BY MOUTH TWICE DAILY   Multiple Vitamin (MULTIVITAMIN WITH MINERALS) TABS tablet Take 1 tablet by mouth 2 (two) times daily.    traMADol  (ULTRAM ) 50 MG tablet Take 1 tablet by mouth daily as needed for pain. MAX OF 4 TABLETS PER DAY   [DISCONTINUED] chlorthalidone  (HYGROTON ) 25 MG tablet TAKE ONE TABLET BY MOUTH ONE TIME DAILY   [DISCONTINUED]  clopidogrel  (PLAVIX ) 75 MG tablet TAKE ONE TABLET BY MOUTH ONE TIME DAILY   [DISCONTINUED] furosemide  (LASIX ) 20 MG tablet Take 1 tablet (20 mg total) by mouth daily as needed for fluid.   [DISCONTINUED] potassium chloride  (KLOR-CON ) 10 MEQ tablet TAKE ONE TABLET (10 MEQ TOTAL) BY MOUTH ONE TIME DAILY   [DISCONTINUED] rosuvastatin  (CRESTOR ) 10 MG tablet TAKE ONE TABLET BY MOUTH ONE TIME DAILY    Allergies:   Carvedilol  and Lipitor [atorvastatin]   Social History   Socioeconomic History   Marital status: Married    Spouse name: Not on file   Number of children: 0   Years of education: Not on file   Highest education level: Not on file  Occupational History   Occupation: retired  Tobacco Use   Smoking status: Former    Current packs/day: 0.00    Average packs/day: 1 pack/day for 25.0 years (25.0 ttl pk-yrs)    Types: Cigarettes    Start  date: 02/05/1965    Quit date: 02/05/1990    Years since quitting: 33.1   Smokeless tobacco: Former    Types: Snuff    Quit date: 01/05/2012   Tobacco comments:    Smoked for 20 years.  Vaping Use   Vaping status: Never Used  Substance and Sexual Activity   Alcohol use: Yes    Alcohol/week: 6.0 standard drinks of alcohol    Types: 6 Cans of beer per week    Comment: 6 beers a week on average   Drug use: No   Sexual activity: Yes    Birth control/protection: Surgical    Comment: Vasectomy  Other Topics Concern   Not on file  Social History Narrative   Prev worked at [employer] with frequent long travel.     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     Married 2000, 2 adult step kids, 3 grandkids.     Social Drivers of Corporate Investment Banker Strain: Not on file  Food Insecurity: Not on file  Transportation Needs: Not on file  Physical Activity: Not on file  Stress: Not on file  Social Connections: Unknown (06/20/2021)   [facility]   Social Network    Social Network: Not on file     Family History:  The patient's family history includes Alzheimer's disease in his paternal grandmother; Colon polyps in his brother; Heart attack in his maternal grandfather and paternal grandfather; Heart disease in his brother and father; Hyperlipidemia in his brother, brother, father, and mother; Hypertension in his brother, brother, and father; Kidney disease in his brother; Leukemia in his father; Parkinsonism in his mother; Stroke in his mother. There is no history of Prostate cancer, Colon cancer, Rectal cancer, or Stomach cancer.  ROS:   12-point review of systems is negative unless otherwise noted in the HPI.   EKGs/Labs/Other Studies Reviewed:    Studies reviewed were summarized above. The additional studies were reviewed today:  2D echo 07/24/2022: 1. Left ventricular ejection fraction, by estimation, is 45 to 50%. The  left ventricle has mildly  decreased function. The left ventricle  demonstrates regional wall motion abnormalities, Inferior hypokinesis.  There is mild left ventricular hypertrophy.  Left ventricular diastolic parameters were normal.   2. Right ventricular systolic function is mildly reduced. The right  ventricular size is mildly enlarged. There is normal pulmonary artery  systolic pressure. The estimated right ventricular systolic pressure is  33.5 mmHg.   3. Left atrial size  was moderately dilated.   4. Right atrial size was moderately dilated.   5. The mitral valve is normal in structure. Trivial mitral valve  regurgitation.   6. The aortic valve is tricuspid. Aortic valve regurgitation is mild to  moderate. No aortic stenosis is present.   7. Aortic dilatation noted. There is dilatation of the aortic root,  measuring 43 mm.   8. The inferior vena cava is normal in size with greater than 50%  respiratory variability, suggesting right atrial pressure of 3 mmHg.    EKG:  EKG ordered 03/11/2023 demonstrated NSR, 64 bpm, RBBB, possible prior inferior infarct, occasional PVCs  Recent Labs: 03/11/2023: ALT 41; BUN 20; Creatinine, Ser 1.21; Hemoglobin 15.4; Platelets 157.0; Potassium 3.8; Pro B Natriuretic peptide (BNP) 1,377.0; Sodium 133; TSH 1.71  Recent Lipid Panel    Component Value Date/Time   CHOL 178 06/21/2022 1620   TRIG 106 06/21/2022 1620   HDL 67 06/21/2022 1620   CHOLHDL 2.7 06/21/2022 1620   CHOLHDL 3 08/31/2019 0847   VLDL 22.6 08/31/2019 0847   LDLCALC 92 06/21/2022 1620   LDLDIRECT 142.8 08/31/2011 0835    PHYSICAL EXAM:    VS:  BP 136/76 (BP Location: Left Arm, Patient Position: Sitting, Cuff Size: Normal)   Pulse (!) 58   Ht 5' 6.5 (1.689 m)   Wt 217 lb 6.4 oz (98.6 kg)   SpO2 97%   BMI 34.56 kg/m   BMI: Body mass index is 34.56 kg/m.  Physical Exam Vitals reviewed.  Constitutional:      Appearance: He is well-developed.  HENT:     Head: Normocephalic and atraumatic.  Eyes:      General:        Right eye: No discharge.        Left eye: No discharge.  Neck:     Vascular: JVD present.     Comments: JVD elevated approximately 8 cm. Cardiovascular:     Rate and Rhythm: Normal rate and regular rhythm. Occasional Extrasystoles are present.    Pulses:          Posterior tibial pulses are 2+ on the right side and 2+ on the left side.     Heart sounds: S1 normal and S2 normal. Heart sounds not distant. No midsystolic click and no opening snap. Murmur heard.     Early diastolic murmur is present with a grade of 2/4 at the upper right sternal border radiating to the apex.     No friction rub.  Pulmonary:     Effort: Pulmonary effort is normal. No respiratory distress.     Breath sounds: Normal breath sounds. No decreased breath sounds, wheezing, rhonchi or rales.  Chest:     Chest wall: No tenderness.  Abdominal:     General: There is no distension.     Palpations: Abdomen is soft.     Tenderness: There is no abdominal tenderness.  Musculoskeletal:     Cervical back: Normal range of motion.     Right lower leg: No edema.     Left lower leg: No edema.  Skin:    General: Skin is warm and dry.     Nails: There is no clubbing.  Neurological:     Mental Status: He is alert and oriented to person, place, and time.  Psychiatric:        Speech: Speech normal.        Behavior: Behavior normal.        Thought Content: Thought  content normal.        Judgment: Judgment normal.     Wt Readings from Last 3 Encounters:  03/12/23 217 lb 6.4 oz (98.6 kg)  03/11/23 222 lb 3.2 oz (100.8 kg)  12/13/22 220 lb (99.8 kg)     ASSESSMENT & PLAN:   Acute on chronic HFmrEF: He remains volume up.  Titrate furosemide  to 40 mg daily for the next 2 days with KCl repletion followed by resumption of furosemide  20 mg daily thereafter.  Follow-up BMP in 1 week.  Obtain echo within the next week to evaluate for progressive cardiomyopathy.  Should his EF remained reduced at 45 to 50%,  or if there is further reduction in LV systolic function, anticipate R/LHC.  Based on echo findings escalate GDMT.  For now, continue Lopressor  50 mg twice daily and losartan  100 mg.  CHF education.  CAD involving the native coronary arteries: Without symptoms of chest pain.  Obtain echo as outlined above with anticipation of R/LHC.  For now, continue current pharmacotherapy including aspirin  81 mg, clopidogrel  75 mg (given cryptogenic CVA), low, and rosuvastatin .  PVCs: Unclear at this point if these are contributing to potential cardiomyopathy or in the setting of cardiomyopathy/ischemia.  Anticipate R/LHC as outlined above.  For now, continue metoprolol .  Aortic insufficiency/mitral regurgitation: Trend on echo.  Dilated aortic root: Noted on echo in 2024 and a CTA chest/aorta in 10/2022.  Trend on echo next week.  Will also need a CTA of the chest/aorta in 10/2023.  HTN: Blood pressure is reasonably controlled in the office today.  Remains on chlorthalidone  25 mg, losartan  100 mg, and Lopressor  50 mg twice daily.  Diuresis as above.  Low-sodium diet recommended.  HLD with statin intolerance: LDL 92 in 06/2022 with target LDL less than 70.  Myalgias noted with atorvastatin.  Remains on rosuvastatin  10 mg.  In follow-up, anticipate titration of rosuvastatin , if tolerated, further to achieve target LDL.  Cryptogenic CVA: No new deficits.  TEE and ILR unrevealing.  Remains on clopidogrel  and rosuvastatin .  Chronic type B thoracoabdominal aortic dissection with ascending thoracic aortic dissection status post tube graft repair with postoperative course complicated by tamponade with hematoma and PEA arrest as well as PE and acute renal failure requiring hemodialysis: Hemodynamically stable.  Will need follow-up imaging in 10/2023.    Disposition: F/u with Dr. Alveta or an APP in 1-2 weeks.   Medication Adjustments/Labs and Tests Ordered: Current medicines are reviewed at length with the patient  today.  Concerns regarding medicines are outlined above. Medication changes, Labs and Tests ordered today are summarized above and listed in the Patient Instructions accessible in Encounters.   Signed, Bernardino Bring, PA-C 03/12/2023 10:23 AM     [facility] [place] [address] [place], [place] [address] [phone]

## 2023-03-11 NOTE — Telephone Encounter (Signed)
Patient saw PCP today and was advised to follow up with cardiology ASAP due to new onset acute heart failure. He mentions that he had labs today and the results will be available in his chart + Lasix 20 MG once daily was prescribed.

## 2023-03-11 NOTE — Progress Notes (Unsigned)
Sx started ~02/22/23.  He had noisy breathing at the time but that cleared up within a few days.  Prev with burning in the chest, prev felt like he couldn't take a deep breath.  More sx supine.  Ankles are not puffier than baseline.  He notes heart fluttering.  No syncope. Can have SOB on exertion.  Can get lightheaded on standing.  Usually sleeps on 1 pillow, now on 2 pillows, having to stack more pillows up.  Can get SOB laying in bed.  Sitting up helps.  He can get SOB with leaning over to tie his shoes.  Not in distress at OV.   Meds, vitals, and allergies reviewed.   ROS: Per HPI unless specifically indicated in ROS section   Nad [place] Neck supple no LA IRR, not tachy Ctab Abd soft Trace BLE edema.   Skin well perfused.   Prev echo with LVEF mildly reduced with an EF of 45 to 50%.  Mild LVH.  Normal diastolic function. Trivial mitral regurgitation  Prev CT with  1. Stable appearance of previously visualized chronic type B thoracoabdominal aortic dissection. 2. Similar appearing postsurgical changes after ascending aortic tube graft repair without complicating features. 3. Slight interval dilation of the aortic root with the sinuses of Valsalva measuring up to 4.0 cm, previously 3.8 cm. 4. Mild global cardiomegaly. 5. Moderate three-vessel coronary atherosclerotic calcifications.  EKG d/w pt at OV.

## 2023-03-12 ENCOUNTER — Encounter: Payer: Self-pay | Admitting: Physician Assistant

## 2023-03-12 ENCOUNTER — Ambulatory Visit: Payer: Medicare Other | Attending: Physician Assistant | Admitting: Physician Assistant

## 2023-03-12 ENCOUNTER — Encounter: Payer: Self-pay | Admitting: Family Medicine

## 2023-03-12 VITALS — BP 136/76 | HR 58 | Ht 66.5 in | Wt 217.4 lb

## 2023-03-12 DIAGNOSIS — I351 Nonrheumatic aortic (valve) insufficiency: Secondary | ICD-10-CM | POA: Insufficient documentation

## 2023-03-12 DIAGNOSIS — E785 Hyperlipidemia, unspecified: Secondary | ICD-10-CM | POA: Insufficient documentation

## 2023-03-12 DIAGNOSIS — R0602 Shortness of breath: Secondary | ICD-10-CM | POA: Insufficient documentation

## 2023-03-12 DIAGNOSIS — I77810 Thoracic aortic ectasia: Secondary | ICD-10-CM | POA: Insufficient documentation

## 2023-03-12 DIAGNOSIS — I25118 Atherosclerotic heart disease of native coronary artery with other forms of angina pectoris: Secondary | ICD-10-CM | POA: Insufficient documentation

## 2023-03-12 DIAGNOSIS — I340 Nonrheumatic mitral (valve) insufficiency: Secondary | ICD-10-CM | POA: Insufficient documentation

## 2023-03-12 DIAGNOSIS — I493 Ventricular premature depolarization: Secondary | ICD-10-CM | POA: Insufficient documentation

## 2023-03-12 DIAGNOSIS — R0609 Other forms of dyspnea: Secondary | ICD-10-CM | POA: Insufficient documentation

## 2023-03-12 DIAGNOSIS — I10 Essential (primary) hypertension: Secondary | ICD-10-CM | POA: Insufficient documentation

## 2023-03-12 DIAGNOSIS — I7103 Dissection of thoracoabdominal aorta: Secondary | ICD-10-CM | POA: Diagnosis present

## 2023-03-12 DIAGNOSIS — I5023 Acute on chronic systolic (congestive) heart failure: Secondary | ICD-10-CM | POA: Insufficient documentation

## 2023-03-12 DIAGNOSIS — I639 Cerebral infarction, unspecified: Secondary | ICD-10-CM | POA: Diagnosis present

## 2023-03-12 MED ORDER — ROSUVASTATIN CALCIUM 10 MG PO TABS: 10.0000 mg | ORAL_TABLET | Freq: Every day | ORAL | 3 refills | Status: DC

## 2023-03-12 MED ORDER — POTASSIUM CHLORIDE ER 10 MEQ PO TBCR: 10.0000 meq | EXTENDED_RELEASE_TABLET | Freq: Every day | ORAL | 3 refills | Status: DC

## 2023-03-12 MED ORDER — CHLORTHALIDONE 25 MG PO TABS: ORAL_TABLET | ORAL | 3 refills | Status: DC

## 2023-03-12 MED ORDER — CLOPIDOGREL BISULFATE 75 MG PO TABS: ORAL_TABLET | ORAL | 3 refills | Status: AC

## 2023-03-12 MED ORDER — FUROSEMIDE 20 MG PO TABS: 20.0000 mg | ORAL_TABLET | Freq: Every day | ORAL | 3 refills | Status: DC | PRN

## 2023-03-12 NOTE — Assessment & Plan Note (Addendum)
 Presumed CHF, d/w pt.  See notes on labs.  Prev advised to go to ER and declined that medical advice.   At this point, not in distress and lungs are clear.   Start lasix , take a dose today.   Advised to call cardiology about getting seen.  If any worse, then go to the ER.  EKG d/w pt at OV.   32 minutes were devoted to patient care in this encounter (this includes time spent reviewing the patient's file/history, interviewing and examining the patient, counseling/reviewing plan with patient).

## 2023-03-12 NOTE — Patient Instructions (Signed)
 Medication Instructions:  Your physician recommends the following medication changes.   INCREASE: Lasix  40 mg for 2 days then reduce to 20 mg daily   *If you need a refill on your cardiac medications before your next appointment, please call your pharmacy*   Lab Work: None ordered at this time    Testing/Procedures: Your physician has requested that you have an echocardiogram . Echocardiography is a painless test that uses sound waves to create images of your heart. It provides your doctor with information about the size and shape of your heart and how well your heart's chambers and valves are working.   You may receive an ultrasound enhancing agent through an IV if needed to better visualize your heart during the echo. This procedure takes approximately one hour.  There are no restrictions for this procedure.  This will take place at [address] [facility] [address]) [address], [place] [address]  Please note: We ask at that you not bring children with you during ultrasound (echo/ vascular) testing. Due to room size and safety concerns, children are not allowed in the ultrasound rooms during exams. Our front office staff cannot provide observation of children in our lobby area while testing is being conducted. An adult accompanying a patient to their appointment will only be allowed in the ultrasound room at the discretion of the ultrasound technician under special circumstances. We apologize for any inconvenience.   Follow-Up: At [facility], you and your health needs are our priority.  As part of our continuing mission to provide you with exceptional heart care, we have created designated Provider Care Teams.  These Care Teams include your primary Cardiologist (physician) and Advanced Practice Providers (APPs -  Physician Assistants and Nurse Practitioners) who all work together to provide you with the care you need, when you need it.   Your next appointment:   1 to 2  week(s)  Provider:   You may see Aleene Passe, MD or one of the following Advanced Practice Providers on your designated Care Team:   Bernardino Bring, PA-C

## 2023-03-12 NOTE — Telephone Encounter (Signed)
Pt seen for office visit today in [place] office by Eula Listen, PA-C

## 2023-03-19 ENCOUNTER — Ambulatory Visit: Payer: Medicare Other | Attending: Physician Assistant

## 2023-03-19 ENCOUNTER — Other Ambulatory Visit: Payer: Self-pay | Admitting: Physician Assistant

## 2023-03-19 ENCOUNTER — Encounter: Payer: Self-pay | Admitting: Physician Assistant

## 2023-03-19 DIAGNOSIS — R0602 Shortness of breath: Secondary | ICD-10-CM

## 2023-03-19 DIAGNOSIS — R0609 Other forms of dyspnea: Secondary | ICD-10-CM | POA: Diagnosis not present

## 2023-03-19 LAB — ECHOCARDIOGRAM COMPLETE
AR max vel: 2.62 cm2
AV Area VTI: 2.54 cm2
AV Area mean vel: 2.54 cm2
AV Mean grad: 4.5 mm[Hg]
AV Peak grad: 7.7 mm[Hg]
AV Vena cont: 0.7 cm
Ao pk vel: 1.39 m/s
Calc EF: 38.4 %
MV VTI: 3.09 cm2
P 1/2 time: 475 ms
S' Lateral: 4.4 cm
Single Plane A2C EF: 35.7 %
Single Plane A4C EF: 37.1 %

## 2023-03-19 NOTE — Progress Notes (Signed)
EKG ordered for reported A-fib noted during echo.

## 2023-03-19 NOTE — Progress Notes (Signed)
Patient presented for scheduled echo this morning.  Echo tech was concerned that the patient was in A-fib.  EKG obtained and showed sinus bradycardia with first-degree AV block with known right bundle branch block, LVH, and PACs.  Tracing is unchanged from prior.

## 2023-03-20 ENCOUNTER — Ambulatory Visit: Payer: Medicare Other | Admitting: Cardiology

## 2023-03-29 ENCOUNTER — Encounter: Payer: Self-pay | Admitting: Physician Assistant

## 2023-03-29 ENCOUNTER — Ambulatory Visit: Payer: Medicare Other | Attending: Physician Assistant | Admitting: Physician Assistant

## 2023-03-29 VITALS — BP 150/76 | HR 53 | Ht 67.0 in | Wt 220.6 lb

## 2023-03-29 DIAGNOSIS — I77810 Thoracic aortic ectasia: Secondary | ICD-10-CM | POA: Diagnosis present

## 2023-03-29 DIAGNOSIS — I351 Nonrheumatic aortic (valve) insufficiency: Secondary | ICD-10-CM | POA: Diagnosis present

## 2023-03-29 DIAGNOSIS — I340 Nonrheumatic mitral (valve) insufficiency: Secondary | ICD-10-CM

## 2023-03-29 DIAGNOSIS — Z79899 Other long term (current) drug therapy: Secondary | ICD-10-CM | POA: Diagnosis present

## 2023-03-29 DIAGNOSIS — I5020 Unspecified systolic (congestive) heart failure: Secondary | ICD-10-CM

## 2023-03-29 DIAGNOSIS — I7103 Dissection of thoracoabdominal aorta: Secondary | ICD-10-CM | POA: Diagnosis present

## 2023-03-29 DIAGNOSIS — I639 Cerebral infarction, unspecified: Secondary | ICD-10-CM | POA: Diagnosis present

## 2023-03-29 DIAGNOSIS — I493 Ventricular premature depolarization: Secondary | ICD-10-CM

## 2023-03-29 DIAGNOSIS — E785 Hyperlipidemia, unspecified: Secondary | ICD-10-CM

## 2023-03-29 DIAGNOSIS — I25118 Atherosclerotic heart disease of native coronary artery with other forms of angina pectoris: Secondary | ICD-10-CM

## 2023-03-29 DIAGNOSIS — R0602 Shortness of breath: Secondary | ICD-10-CM

## 2023-03-29 DIAGNOSIS — I10 Essential (primary) hypertension: Secondary | ICD-10-CM | POA: Diagnosis present

## 2023-03-29 MED ORDER — SACUBITRIL-VALSARTAN 49-51 MG PO TABS: 1.0000 | ORAL_TABLET | Freq: Two times a day (BID) | ORAL | Status: DC

## 2023-03-29 MED ORDER — SACUBITRIL-VALSARTAN 49-51 MG PO TABS: 1.0000 | ORAL_TABLET | Freq: Two times a day (BID) | ORAL | 3 refills | Status: DC

## 2023-03-29 NOTE — Progress Notes (Signed)
 Cardiology Office Note    Date:  03/29/2023   ID:  IAM LIPSON, DOB March 11, 1955, MRN [id number]  PCP:  Joaquim Nam, MD  Cardiologist:  Kristeen Miss, MD  Electrophysiologist:  None   Chief Complaint: Follow-up  History of Present Illness:   Gabriel Tran is a 68 y.o. male with history of CAD noted on imaging, HFrEF, ascending aortic dissection in the setting of uncontrolled hypertension status post repair complicated by tamponade with PEA arrest and PE in 2013, dilated aortic root and ascending aorta, cryptogenic CVA, HTN, HLD, and prior tobacco use who presents for follow-up of echo.   He underwent initial emergent ascending thoracic aortic dissection repair on 10/30/2011, in the setting of uncontrolled hypertension.  Postop course was complicated by tamponade/hematoma evacuation on 11/02/2012 with PEA arrest and PE.  LHC showed no evidence of obstructive CAD.  Hospital course was also complicated by renal failure requiring dialysis.  He was admitted in 2015 with left brain stroke with right hemiparesis.  Initial blood pressure of 200/115.  He was treated with tPA.  MRI showed a left frontal middle cerebral artery branch infarct felt to be possibly embolic.  Surface echo showed an EF of 60 to 65%, no regional wall motion neurologist, and mild aortic insufficiency.  He was discharged on clopidogrel.  He subsequently underwent TEE that showed no evidence of emboli.  ILR was implanted that showed no evidence of arrhythmia.   He was last seen in 06/2022 noting exertional dyspnea.  Echo from 07/2022 showed an EF of 45 to 50%, inferior hypokinesis, mild LVH, normal LV diastolic function parameters, mildly reduced RV systolic function mildly enlarged ventricular cavity size and normal RVSP, moderate biatrial enlargement, trivial mitral regurgitation, mild to moderate aortic insufficiency, dilated aortic root measuring 43 mm, and an estimated right atrial pressure of 3 mmHg.   CTA  chest/aorta in 10/2022 showed stable appearance of previously visualized chronic type B thoracoabdominal aortic dissection as well as similar appearing postsurgical changes after a ascending aortic tube graft repair without complicating features.  Slight interval dilatation of the aortic root with the sinuses of Valsalva measuring up to 4 cm (previously 3.8 cm), mild global cardiomegaly, and moderate three-vessel coronary artery calcification.   He was seen at his PCP's office on 03/11/2023 and reported progressive dyspnea and orthopnea with associated palpitations that began around 02/22/2023.  He was advised to take a dose of Lasix 20 mg.  Labs obtained at that time showed a BNP of 1377.  EKG obtained at that time demonstrated sinus rhythm with a known right bundle branch block with occasional PVCs.  He was evaluated in our office on 2//2025 noting shortness of breath that initially began in 01/2022 that was relatively stable up until the past several weeks.  In 02/2023, he developed significant shortness of breath with exertional fatigue without frank chest pain.  In this setting he has noted progressive orthopnea.  No significant lower extremity swelling.  Weight had been largely stable around 220 pounds throughout the above.  It was recommended he titrate furosemide to 40 mg daily for 2 days followed by resumption of 20 mg daily thereafter.  Echo 01/16/2024 showed an EF of 35 to 40%, global hypokinesis, mild LVH, moderately reduced RV systolic function with normal ventricular cavity size and RVSP, mildly dilated left atrium, mild to moderate aortic valve insufficiency with aortic valve sclerosis without evidence of stenosis, mild dilatation of the aortic root measuring 43 mm, and an estimated right  atrial pressure of 8 mmHg.  He comes in accompanied by his wife today and is doing well from a cardiac perspective.  Feels like his breathing is back to baseline.  However, he does note some exertional shortness of  breath if he overexerts himself.  No chest pain.  No dizziness, presyncope, or syncope.  No significant lower extremity swelling.  Orthopnea is improved and he is now back to baseline 2 pillows when sleeping.  Weight remains stable at home around 220 pounds.  Remains on furosemide 20 mg daily and reports good urine output with this.  Only taking rosuvastatin as he is concerned about cognitive decline.  With discontinuation of rosuvastatin he feels like he is thinking more clearly.  Blood pressure is mildly elevated in the office today, he has only taken some of his medications and has not yet taken losartan.   Labs independently reviewed: 03/2023 - TSH normal, potassium 3.8, BUN 20, serum creatinine 1.21, albumin 4.2, AST/ALT normal, Hgb 15.4, PLT 157 06/2022 - TC 170, TG 106, HDL 67, LDL 92  Past Medical History:  Diagnosis Date   Aortic dissection (HCC)    Cardiac tamponade 2013   Colon polyps 2017   CVA (cerebral infarction)    HLD (hyperlipidemia)    Hyperglycemia    a. A1C 5.9 in Sept 2013.   Hypertension    PE (pulmonary embolism)    RBBB    Renal insufficiency 08/28/2012   Sleep apnea    history of    Past Surgical History:  Procedure Laterality Date   CARPAL TUNNEL RELEASE Right 2004   COLONOSCOPY     INSERTION OF DIALYSIS CATHETER  11/06/2011   Procedure: INSERTION OF DIALYSIS CATHETER;  Surgeon: Kerin Perna, MD;  Location: [facility] OR;  Service: Thoracic;  Laterality: Right;   INSERTION OF DIALYSIS CATHETER  11/13/2011   Procedure: INSERTION OF DIALYSIS CATHETER;  Surgeon: Sherren Kerns, MD;  Location: [facility] OR;  Service: Vascular;  Laterality: N/A;  Ultrasound guided.   LEFT HEART CATHETERIZATION WITH CORONARY ANGIOGRAM N/A 10/30/2011   Procedure: LEFT HEART CATHETERIZATION WITH CORONARY ANGIOGRAM;  Surgeon: Kathleene Hazel, MD;  Location: [facility] CATH LAB;  Service: Cardiovascular;  Laterality: N/A;   LIGAMENT REPAIR Left 1981   Knee   LOOP RECORDER IMPLANT N/A  01/20/2014   MDT LINQ implanted by Dr Ladona Ridgel for cryptogenic stroke   MEDIASTINAL EXPLORATION  11/06/2011   Procedure: MEDIASTINAL EXPLORATION;  Surgeon: Kerin Perna, MD;  Location: [facility] OR;  Service: Thoracic;  Laterality: N/A;  sternal closure; removal of wound vac   POLYPECTOMY     TEE WITHOUT CARDIOVERSION N/A 01/20/2014   Procedure: TRANSESOPHAGEAL ECHOCARDIOGRAM (TEE);  Surgeon: Donato Schultz, MD;  Location: [facility] ENDOSCOPY;  Service: Cardiovascular;  Laterality: N/A;   THORACIC AORTIC ANEURYSM REPAIR  10/30/2011   Procedure: THORACIC ASCENDING ANEURYSM REPAIR (AAA);  Surgeon: Kerin Perna, MD;  Location: [facility] OR;  Service: Open Heart Surgery;  Laterality: N/A;  Ascending aortic dissection repair, arch reconstruction, aortic valve repair   WISDOM TOOTH EXTRACTION      Current Medications: Current Meds  Medication Sig   ALPRAZolam (XANAX) 0.5 MG tablet TAKE 1 TABLET(0.5 MG) BY MOUTH TWICE DAILY AS NEEDED FOR ANXIETY   aspirin 81 MG chewable tablet Chew 81 mg by mouth daily.   chlorthalidone (HYGROTON) 25 MG tablet TAKE ONE TABLET BY MOUTH ONE TIME DAILY   Cholecalciferol (VITAMIN D3) 50 MCG (2000 UT) TABS Take by mouth. daily   clopidogrel (PLAVIX)  75 MG tablet TAKE ONE TABLET BY MOUTH ONE TIME DAILY   cyclobenzaprine (FLEXERIL) 10 MG tablet TAKE 1/2 TO 1 TABLET BY MOUTH THREE TIMES DAILY AS NEEDED FOR MUSCLE SPASMS,( SEDATION CAUTION)   furosemide (LASIX) 20 MG tablet Take 1 tablet (20 mg total) by mouth daily as needed for fluid.   methocarbamol (ROBAXIN) 500 MG tablet Take 1 tablet by mouth daily as needed for spasms.   metoprolol tartrate (LOPRESSOR) 50 MG tablet TAKE ONE TABLET BY MOUTH TWICE DAILY   Multiple Vitamin (MULTIVITAMIN WITH MINERALS) TABS tablet Take 1 tablet by mouth 2 (two) times daily.    potassium chloride (KLOR-CON) 10 MEQ tablet Take 1 tablet (10 mEq total) by mouth daily.   sacubitril-valsartan (ENTRESTO) 49-51 MG Take 1 tablet by mouth 2 (two) times daily.    sacubitril-valsartan (ENTRESTO) 49-51 MG Take 1 tablet by mouth 2 (two) times daily.   traMADol (ULTRAM) 50 MG tablet Take 1 tablet by mouth daily as needed for pain. MAX OF 4 TABLETS PER DAY   [DISCONTINUED] losartan (COZAAR) 100 MG tablet TAKE ONE TABLET BY MOUTH ONCE A DAY    Allergies:   Carvedilol and Lipitor [atorvastatin]   Social History   Socioeconomic History   Marital status: Married    Spouse name: Not on file   Number of children: 0   Years of education: Not on file   Highest education level: Not on file  Occupational History   Occupation: retired  Tobacco Use   Smoking status: Former    Current packs/day: 0.00    Average packs/day: 1 pack/day for 25.0 years (25.0 ttl pk-yrs)    Types: Cigarettes    Start date: 02/05/1965    Quit date: 02/05/1990    Years since quitting: 33.1   Smokeless tobacco: Former    Types: Snuff    Quit date: 01/05/2012   Tobacco comments:    Smoked for 20 years.  Vaping Use   Vaping status: Never Used  Substance and Sexual Activity   Alcohol use: Yes    Alcohol/week: 6.0 standard drinks of alcohol    Types: 6 Cans of beer per week    Comment: 6 beers a week on average   Drug use: No   Sexual activity: Yes    Birth control/protection: Surgical    Comment: Vasectomy  Other Topics Concern   Not on file  Social History Narrative   Prev worked at [employer] with frequent long travel.     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     Married 2000, 2 adult step kids, 3 grandkids.     Social Drivers of Corporate investment banker Strain: Not on file  Food Insecurity: Not on file  Transportation Needs: Not on file  Physical Activity: Not on file  Stress: Not on file  Social Connections: Unknown (06/20/2021)   Received [facility]   Social Network    Social Network: Not on file     Family History:  The patient's family history includes Alzheimer's disease in his paternal grandmother; Colon polyps in his  brother; Heart attack in his maternal grandfather and paternal grandfather; Heart disease in his brother and father; Hyperlipidemia in his brother, brother, father, and mother; Hypertension in his brother, brother, and father; Kidney disease in his brother; Leukemia in his father; Parkinsonism in his mother; Stroke in his mother. There is no history of Prostate cancer, Colon cancer, Rectal cancer, or Stomach cancer.  ROS:  12-point review of systems is negative unless otherwise noted in the HPI.   EKGs/Labs/Other Studies Reviewed:    Studies reviewed were summarized above. The additional studies were reviewed today:  2D echo 07/24/2022: 1. Left ventricular ejection fraction, by estimation, is 45 to 50%. The  left ventricle has mildly decreased function. The left ventricle  demonstrates regional wall motion abnormalities, Inferior hypokinesis.  There is mild left ventricular hypertrophy.  Left ventricular diastolic parameters were normal.   2. Right ventricular systolic function is mildly reduced. The right  ventricular size is mildly enlarged. There is normal pulmonary artery  systolic pressure. The estimated right ventricular systolic pressure is  33.5 mmHg.   3. Left atrial size was moderately dilated.   4. Right atrial size was moderately dilated.   5. The mitral valve is normal in structure. Trivial mitral valve  regurgitation.   6. The aortic valve is tricuspid. Aortic valve regurgitation is mild to  moderate. No aortic stenosis is present.   7. Aortic dilatation noted. There is dilatation of the aortic root,  measuring 43 mm.   8. The inferior vena cava is normal in size with greater than 50%  respiratory variability, suggesting right atrial pressure of 3 mmHg.  __________  2D echo 03/19/2023: 1. Left ventricular ejection fraction, by estimation, is 35 to 40%. Left  ventricular ejection fraction by 2D MOD biplane is 38.4 %. The left  ventricle has moderately decreased  function. The left ventricle  demonstrates global hypokinesis. There is mild  left ventricular hypertrophy. Left ventricular diastolic parameters are  indeterminate.   2. Right ventricular systolic function is moderately reduced. The right  ventricular size is normal. There is normal pulmonary artery systolic  pressure. The estimated right ventricular systolic pressure is 21.0 mmHg.   3. Left atrial size was mildly dilated.   4. The mitral valve is normal in structure. No evidence of mitral valve  regurgitation. No evidence of mitral stenosis.   5. The aortic valve has an indeterminant number of cusps. There is mild  calcification of the aortic valve. Aortic valve regurgitation is mild to  moderate. Aortic valve sclerosis is present, with no evidence of aortic  valve stenosis.   6. There is mild dilatation of the aortic root, measuring 43 mm.   7. The inferior vena cava is dilated in size with >50% respiratory  variability, suggesting right atrial pressure of 8 mmHg.   Comparison(s): Previous LVEF reported as 45-50%.     EKG:  EKG is ordered today.  The EKG ordered today demonstrates sinus pericardia, 53 bpm, first-degree AV block, RBBB, LVH, possible prior inferior infarct  Recent Labs: 03/11/2023: ALT 41; BUN 20; Creatinine, Ser 1.21; Hemoglobin 15.4; Platelets 157.0; Potassium 3.8; Pro B Natriuretic peptide (BNP) 1,377.0; Sodium 133; TSH 1.71  Recent Lipid Panel    Component Value Date/Time   CHOL 178 06/21/2022 1620   TRIG 106 06/21/2022 1620   HDL 67 06/21/2022 1620   CHOLHDL 2.7 06/21/2022 1620   CHOLHDL 3 08/31/2019 0847   VLDL 22.6 08/31/2019 0847   LDLCALC 92 06/21/2022 1620   LDLDIRECT 142.8 08/31/2011 0835    PHYSICAL EXAM:    VS:  BP (!) 150/76 (BP Location: Left Arm, Patient Position: Sitting, Cuff Size: Normal) Comment: just took meds this am  Pulse (!) 53   Ht 5\' 7"  (1.702 m)   Wt 220 lb 9.6 oz (100.1 kg)   SpO2 98%   BMI 34.55 kg/m   BMI: Body mass index  is 34.55 kg/m.  Physical Exam Vitals reviewed.  Constitutional:      Appearance: He is well-developed.  HENT:     Head: Normocephalic and atraumatic.  Eyes:     General:        Right eye: No discharge.        Left eye: No discharge.  Neck:     Vascular: No JVD.  Cardiovascular:     Rate and Rhythm: Normal rate and regular rhythm.     Pulses:          Posterior tibial pulses are 2+ on the right side and 2+ on the left side.     Heart sounds: S1 normal and S2 normal. Heart sounds not distant. No midsystolic click and no opening snap. Murmur heard.     Diastolic murmur is present with a grade of 1/4 at the upper right sternal border radiating to the apex.     No friction rub.  Pulmonary:     Effort: Pulmonary effort is normal. No respiratory distress.     Breath sounds: Normal breath sounds. No decreased breath sounds, wheezing, rhonchi or rales.  Chest:     Chest wall: No tenderness.  Musculoskeletal:     Cervical back: Normal range of motion.     Right lower leg: No edema.     Left lower leg: No edema.  Skin:    General: Skin is warm and dry.     Nails: There is no clubbing.  Neurological:     Mental Status: He is alert and oriented to person, place, and time.  Psychiatric:        Speech: Speech normal.        Behavior: Behavior normal.        Thought Content: Thought content normal.        Judgment: Judgment normal.     Wt Readings from Last 3 Encounters:  03/29/23 220 lb 9.6 oz (100.1 kg)  03/12/23 217 lb 6.4 oz (98.6 kg)  03/11/23 222 lb 3.2 oz (100.8 kg)     ASSESSMENT & PLAN:   HFrEF: Of uncertain etiology.  Volume status much improved.  Breathing back to baseline.  Stop losartan.  Start Entresto 49/51 mg twice daily.  For now, continue Lopressor 50 mg twice daily with plans to consolidate this to Toprol-XL at next visit.  Previously noted to be intolerant to carvedilol.  Remains on furosemide 20 mg daily.  Anticipate further escalation of GDMT and follow-up.   Pursue coronary CTA as outlined below.  Could consider RHC down the road.  CAD involving the native coronary arteries with other forms of angina/exertional dyspnea: He is without symptoms of management with improved volume status.  In the setting of his progressive cardiomyopathy we need to exclude ischemic etiology.  However, with the patient's history of thoracoabdominal aortic dissection status post repair, after discussion with interventional cardiology, we have agreed to proceed with coronary CTA as an initial step to evaluate for obstructive CAD in an effort to minimize invasive procedure.  Continue aspirin 81 mg.  Self discontinued rosuvastatin.  Following coronary CTA will need to discuss addition of PCSK9 inhibitor.  Intolerant to atorvastatin secondary to aches and rosuvastatin secondary to mental fogginess.  PVCs: Quiescent.  Metoprolol as above.  Aortic insufficiency/mitral regurgitation: Stable mild to moderate aortic valve insufficiency by echo in 03/2023.  No evidence of mitral regurgitation on most recent echo.  Monitor periodically.  Dilated aortic root and ascending aorta: With history of chronic  type B thoracoabdominal aortic dissection with ascending thoracic aortic dissection status post tube graft repair with postoperative course complicated by tamponade with hematoma and PEA arrest as well as PE and acute renal failure requiring hemodialysis: Hemodynamically stable.  Will need follow-up imaging in 10/2023.  HTN: Blood pressure is mildly elevated in the office today, though he recently took some of his medications and has not yet taken losartan.  Transitioning from losartan to [facility] as outlined above with continuation of Lopressor 50 mg twice daily and chlorthalidone 25 mg daily.  Low-sodium diet recommended.  HLD with statin intolerance: LDL 92 in 06/2022 Target LDL less than 70.  Self discontinued rosuvastatin.  Pursue PCSK9 inhibitor in follow-up.  Cryptogenic CVA: No new  deficits.  TEE and ILR unrevealing.  Remains on clopidogrel.  Will need to address cholesterol management at follow-up.     Disposition: F/u with Dr. Elease Hashimoto or an APP in 4 weeks.   Medication Adjustments/Labs and Tests Ordered: Current medicines are reviewed at length with the patient today.  Concerns regarding medicines are outlined above. Medication changes, Labs and Tests ordered today are summarized above and listed in the Patient Instructions accessible in Encounters.   SignedEula Listen, PA-C 03/29/2023 10:03 AM      [facility] [address] [place], [place] [address] [phone]

## 2023-03-29 NOTE — Patient Instructions (Signed)
 Medication Instructions:  Your physician recommends the following medication changes.  STOP TAKING: Losartan  START TAKING: Entresto 49/51 mg twice daily  *If you need a refill on your cardiac medications before your next appointment, please call your pharmacy*   Lab Work: Your provider would like for you to have following labs drawn today BMeT.   If you have labs (blood work) drawn today and your tests are completely normal, you will receive your results only by: MyChart Message (if you have MyChart) OR A paper copy in the mail If you have any lab test that is abnormal or we need to change your treatment, we will call you to review the results.   Testing/Procedures:   Your cardiac CT will be scheduled at the below locations:    [facility] [place] [address] [place], [place] [address] [phone]  If scheduled at [facility] or [facility], please arrive 15 mins early for check-in and test prep.  Please follow these instructions carefully (unless otherwise directed):  An IV will be required for this test and Nitroglycerin will be given.  Hold all erectile dysfunction medications at least 3 days (72 hrs) prior to test. (Ie viagra, cialis, sildenafil, tadalafil, etc)   On the Night Before the Test: Be sure to Drink plenty of water. Do not consume any caffeinated/decaffeinated beverages or chocolate 12 hours prior to your test. Do not take any antihistamines 12 hours prior to your test.  On the Day of the Test: Drink plenty of water until 1 hour prior to the test. Do not eat any food 1 hour prior to test. You may take your regular medications prior to the test.  If you take Furosemide/Hydrochlorothiazide/Spironolactone/Chlorthalidone, please HOLD on the morning of the test. Patients who wear a continuous glucose monitor MUST remove the device prior to scanning.      After the  Test: Drink plenty of water. After receiving IV contrast, you may experience a mild flushed feeling. This is normal. On occasion, you may experience a mild rash up to 24 hours after the test. This is not dangerous. If this occurs, you can take Benadryl 25 mg, Zyrtec, Claritin, or Allegra and increase your fluid intake. (Patients taking Tikosyn should avoid Benadryl, and may take Zyrtec, Claritin, or Allegra) If you experience trouble breathing, this can be serious. If it is severe call 911 IMMEDIATELY. If it is mild, please call our office.  We will call to schedule your test 2-4 weeks out understanding that some insurance companies will need an authorization prior to the service being performed.   For more information and frequently asked questions, please visit our website : [email or web address]  For non-scheduling related questions, please contact the cardiac imaging nurse navigator should you have any questions/concerns: Cardiac Imaging Nurse Navigators Direct Office Dial: [phone]   For scheduling needs, including cancellations and rescheduling, please call [place], [phone].   Follow-Up: At [facility], you and your health needs are our priority.  As part of our continuing mission to provide you with exceptional heart care, we have created designated Provider Care Teams.  These Care Teams include your primary Cardiologist (physician) and Advanced Practice Providers (APPs -  Physician Assistants and Nurse Practitioners) who all work together to provide you with the care you need, when you need it.    Your next appointment:   4-6 week(s)  Provider:   You may see Eula Listen, PA-C

## 2023-03-30 LAB — BASIC METABOLIC PANEL
BUN/Creatinine Ratio: 19 (ref 10–24)
BUN: 24 mg/dL (ref 8–27)
CO2: 28 mmol/L (ref 20–29)
Calcium: 10.4 mg/dL — ABNORMAL HIGH (ref 8.6–10.2)
Chloride: 94 mmol/L — ABNORMAL LOW (ref 96–106)
Creatinine, Ser: 1.29 mg/dL — ABNORMAL HIGH (ref 0.76–1.27)
Glucose: 120 mg/dL — ABNORMAL HIGH (ref 70–99)
Potassium: 4.1 mmol/L (ref 3.5–5.2)
Sodium: 134 mmol/L (ref 134–144)
eGFR: 62 mL/min/{1.73_m2} (ref >59)

## 2023-04-02 ENCOUNTER — Encounter (HOSPITAL_COMMUNITY): Payer: Self-pay

## 2023-04-04 ENCOUNTER — Ambulatory Visit
Admission: RE | Admit: 2023-04-04 | Discharge: 2023-04-04 | Disposition: A | Discharge status: Home / Self Care | Payer: Medicare Other | Source: Ambulatory Visit | Attending: Physician Assistant | Admitting: Physician Assistant

## 2023-04-04 DIAGNOSIS — R0602 Shortness of breath: Secondary | ICD-10-CM | POA: Insufficient documentation

## 2023-04-04 DIAGNOSIS — I25118 Atherosclerotic heart disease of native coronary artery with other forms of angina pectoris: Secondary | ICD-10-CM | POA: Insufficient documentation

## 2023-04-04 DIAGNOSIS — I5020 Unspecified systolic (congestive) heart failure: Secondary | ICD-10-CM | POA: Insufficient documentation

## 2023-04-04 MED ORDER — IOHEXOL 300 MG/ML  SOLN: 100.0000 mL | Freq: Once | INTRAMUSCULAR | Status: DC | PRN

## 2023-04-04 MED ORDER — NITROGLYCERIN 0.4 MG SL SUBL
0.8000 mg | SUBLINGUAL_TABLET | Freq: Once | SUBLINGUAL | Status: AC
  Administered 2023-04-04: 0.8 mg via SUBLINGUAL

## 2023-04-04 MED ORDER — SODIUM CHLORIDE 0.9 % IV BOLUS
150.0000 mL | Freq: Once | INTRAVENOUS | Status: AC
Start: 2023-04-04 — End: 2023-04-04
  Administered 2023-04-04: 150 mL via INTRAVENOUS

## 2023-04-04 MED ORDER — IOHEXOL 350 MG/ML SOLN
100.0000 mL | Freq: Once | INTRAVENOUS | Status: AC | PRN
  Administered 2023-04-04: 100 mL via INTRAVENOUS

## 2023-04-04 NOTE — Progress Notes (Signed)
 Patient tolerated CT well. Drank water after. Vital signs stable encourage to drink water throughout day.Reasons explained and verbalized understanding. Ambulated steady gait.

## 2023-04-10 ENCOUNTER — Other Ambulatory Visit: Payer: Self-pay | Admitting: Emergency Medicine

## 2023-04-10 DIAGNOSIS — I77810 Thoracic aortic ectasia: Secondary | ICD-10-CM

## 2023-04-15 ENCOUNTER — Telehealth: Payer: Self-pay | Admitting: Physician Assistant

## 2023-04-15 NOTE — Telephone Encounter (Signed)
 New aMessage:     Patient says he would like to talk to [facility] please. He says his blood pressure is still running high.   Pt c/o BP issue:  1. What are your last 5 BP readings?  161/85  about 2 hrs ago  and pulse 69- 177/86 this morning before medicine  pulse was 73- he said yesterday 177/86 and pulse 73,  148/84 and pulse was 78 after he took his medicine Saturday 180/87, 180/92 156/78 172/79   2. Are you having any other symptoms (ex. Dizziness, headache, blurred vision, passed out)?  He said his wife said his face was red  3. What is your medication issue? high blood pressure

## 2023-04-15 NOTE — Telephone Encounter (Signed)
 Spoke with pt, he reports his blood pressure runs borderline 150-160, rarely it is 140. He does report taking the lopressor once daily. Explained per last office note the lopressor should be taken twice daily, he will restart taking it twice daily but he feels he will need something else. Increasing to twice daily, he feels will not help get his blood pressure down. He reports headache and red face when blood pressure is elevated. He reports a cough since starting entresto but then states it maybe pollen related, he does not take anything for allergies. He reports it is a deep cough and this morning he had clear gray sputum. Aware will forward to the PA for recommendations.

## 2023-04-16 NOTE — Telephone Encounter (Signed)
 If blood pressure is continuing to run greater than 140 mmHg systolic, increase Entresto to 97/103 mg twice daily.  He previously tolerated losartan, so suspect cough is unrelated to transition to [facility].  This should improve his blood pressure back to goal.

## 2023-04-24 NOTE — Telephone Encounter (Signed)
 See MYChart note on 3/19 at 6:46 PM

## 2023-04-29 NOTE — Progress Notes (Unsigned)
 Cardiology Office Note    Date:  05/02/2023   ID:  [name], DOB May 26, 1955, MRN [id number]  PCP:  Joaquim Nam, MD  Cardiologist:  Kristeen Miss, MD  Electrophysiologist:  None   Chief Complaint: Follow-up  History of Present Illness:   Gabriel Tran is a 68 y.o. male with history of nonobstructive CAD by coronary CTA in 03/2023, HFrEF secondary to NICM, ascending aortic dissection in the setting of uncontrolled hypertension status post repair complicated by tamponade with PEA arrest and PE in 2013, dilated aortic root and ascending aorta, cryptogenic CVA, HTN, HLD, and prior tobacco use who presents for follow-up of CAD and cardiomyopathy.  He underwent initial emergent ascending thoracic aortic dissection repair on 10/30/2011, in the setting of uncontrolled hypertension.  Postop course was complicated by tamponade/hematoma evacuation on 11/02/2012 with PEA arrest and PE.  LHC showed no evidence of obstructive CAD.  Hospital course was also complicated by renal failure requiring dialysis.  He was admitted in 2015 with left brain stroke with right hemiparesis.  Initial blood pressure of 200/115.  He was treated with tPA.  MRI showed a left frontal middle cerebral artery branch infarct felt to be possibly embolic.  Surface echo showed an EF of 60 to 65%, no regional wall motion neurologist, and mild aortic insufficiency.  He was discharged on clopidogrel.  He subsequently underwent TEE that showed no evidence of emboli.  ILR was implanted that showed no evidence of arrhythmia.   He was seen in 06/2022 noting exertional dyspnea.  Echo from 07/2022 showed an EF of 45 to 50%, inferior hypokinesis, mild LVH, normal LV diastolic function parameters, mildly reduced RV systolic function mildly enlarged ventricular cavity size and normal RVSP, moderate biatrial enlargement, trivial mitral regurgitation, mild to moderate aortic insufficiency, dilated aortic root measuring 43 mm, and an  estimated right atrial pressure of 3 mmHg.   CTA chest/aorta in 10/2022 showed stable appearance of previously visualized chronic type B thoracoabdominal aortic dissection as well as similar appearing postsurgical changes after ascending aortic tube graft repair without complicating features.  Slight interval dilatation of the aortic root with the sinuses of Valsalva measuring up to 4 cm (previously 3.8 cm), mild global cardiomegaly, and moderate three-vessel coronary artery calcification.   He was seen at his PCP's office on 03/11/2023 and reported progressive dyspnea and orthopnea with associated palpitations that began around 02/22/2023.  He was advised to take a dose of Lasix 20 mg.  Labs obtained at that time showed a BNP of 1377.  EKG obtained at that time demonstrated sinus rhythm with a known right bundle branch block with occasional PVCs.   He was evaluated in our office on 03/12/2023 noting shortness of breath that initially began in 01/2022 that was relatively stable up until the past several weeks.  In 02/2023, he developed significant shortness of breath with exertional fatigue without frank chest pain.  In this setting he had noted progressive orthopnea.  No significant lower extremity swelling.  Weight had been largely stable around 220 pounds throughout the above.  It was recommended he titrate furosemide to 40 mg daily for 2 days followed by resumption of 20 mg daily thereafter.  Echo 01/16/2024 showed an EF of 35 to 40%, global hypokinesis, mild LVH, moderately reduced RV systolic function with normal ventricular cavity size and RVSP, mildly dilated left atrium, mild to moderate aortic valve insufficiency with aortic valve sclerosis without evidence of stenosis, mild dilatation of the aortic root measuring  43 mm, and an estimated right atrial pressure of 8 mmHg.  He was most recently seen in the office on 03/29/2023 feeling like his breathing was back to baseline.  However, he did note some  exertional shortness of breath with overexertion.  Orthopnea was improved.  Weight remained stable at home around 220 pounds.  He was taking furosemide 20 mg daily with good urine output.  He had self discontinued rosuvastatin due to concerns of cognitive decline.  Losartan was stopped and he was started on Entresto 49/51 mg twice daily with continuation of Lopressor 50 mg twice daily with plans to consolidate this to Toprol-XL moving forward.  After discussion with interventional cardiology, given his history of thoracoabdominal aortic dissection status post repair, we pursued coronary CTA on 04/04/2023 that showed a calcium score of 417 which was the 80th percentile.  There was 25% proximal LAD stenosis and less than 25% proximal RCA stenosis.  Aortic root dilated, measuring 51 mm.  Noncardiac overread showed stable dissection of the descending thoracic aorta with dilated aortic root measuring 51 mm (up from study measuring 4.0 cm in 10/2022).  He was referred to CT surgery for ongoing evaluation and has an appointment with them on 4/9.  In the setting of continued elevation in BP in the 150s to 160s, Entresto was subsequently titrated to 97/103 mg twice daily.  He comes in doing well from a cardiac perspective and is without symptoms of angina or cardiac decompensation.  Breathing remains at baseline.  His weight is down 7 pounds today when compared to his visit in 03/2023.  He continues to note vigorous urine output with Lasix 20 mg daily.  No progressive orthopnea or lower extremity swelling.  No dizziness, presyncope, or syncope.  He does believe his home BP cuff is not calibrated when compared to ours.  Interested in pursuing PCSK9 inhibitor.  Notes some fatigue with daytime metoprolol.   Labs independently reviewed: 03/2023 - BUN 24, serum creatinine 1.29, potassium 4.1, BNP 1377, TSH normal, albumin 4.2, AST/ALT normal, Hgb 15.4, PLT 157 06/2022 - TC 170, TG 106, HDL 67, LDL 92  Past Medical History:   Diagnosis Date   Aortic dissection (HCC)    Cardiac tamponade 2013   Colon polyps 2017   CVA (cerebral infarction)    HLD (hyperlipidemia)    Hyperglycemia    a. A1C 5.9 in Sept 2013.   Hypertension    PE (pulmonary embolism)    RBBB    Renal insufficiency 08/28/2012   Sleep apnea    history of    Past Surgical History:  Procedure Laterality Date   CARPAL TUNNEL RELEASE Right 2004   COLONOSCOPY     INSERTION OF DIALYSIS CATHETER  11/06/2011   Procedure: INSERTION OF DIALYSIS CATHETER;  Surgeon: Kerin Perna, MD;  Location: [facility] OR;  Service: Thoracic;  Laterality: Right;   INSERTION OF DIALYSIS CATHETER  11/13/2011   Procedure: INSERTION OF DIALYSIS CATHETER;  Surgeon: Sherren Kerns, MD;  Location: [facility] OR;  Service: Vascular;  Laterality: N/A;  Ultrasound guided.   LEFT HEART CATHETERIZATION WITH CORONARY ANGIOGRAM N/A 10/30/2011   Procedure: LEFT HEART CATHETERIZATION WITH CORONARY ANGIOGRAM;  Surgeon: Kathleene Hazel, MD;  Location: [facility] CATH LAB;  Service: Cardiovascular;  Laterality: N/A;   LIGAMENT REPAIR Left 1981   Knee   LOOP RECORDER IMPLANT N/A 01/20/2014   MDT LINQ implanted by Dr Ladona Ridgel for cryptogenic stroke   MEDIASTINAL EXPLORATION  11/06/2011   Procedure: MEDIASTINAL EXPLORATION;  Surgeon: Kerin Perna, MD;  Location: [facility] OR;  Service: Thoracic;  Laterality: N/A;  sternal closure; removal of wound vac   POLYPECTOMY     TEE WITHOUT CARDIOVERSION N/A 01/20/2014   Procedure: TRANSESOPHAGEAL ECHOCARDIOGRAM (TEE);  Surgeon: Donato Schultz, MD;  Location: [facility] ENDOSCOPY;  Service: Cardiovascular;  Laterality: N/A;   THORACIC AORTIC ANEURYSM REPAIR  10/30/2011   Procedure: THORACIC ASCENDING ANEURYSM REPAIR (AAA);  Surgeon: Kerin Perna, MD;  Location: [facility] OR;  Service: Open Heart Surgery;  Laterality: N/A;  Ascending aortic dissection repair, arch reconstruction, aortic valve repair   WISDOM TOOTH EXTRACTION      Current Medications: Current Meds   Medication Sig   ALPRAZolam (XANAX) 0.5 MG tablet TAKE 1 TABLET(0.5 MG) BY MOUTH TWICE DAILY AS NEEDED FOR ANXIETY   aspirin 81 MG chewable tablet Chew 81 mg by mouth daily.   chlorthalidone (HYGROTON) 25 MG tablet TAKE ONE TABLET BY MOUTH ONE TIME DAILY   Cholecalciferol (VITAMIN D3) 50 MCG (2000 UT) TABS Take by mouth. daily   clopidogrel (PLAVIX) 75 MG tablet TAKE ONE TABLET BY MOUTH ONE TIME DAILY   cyclobenzaprine (FLEXERIL) 10 MG tablet TAKE 1/2 TO 1 TABLET BY MOUTH THREE TIMES DAILY AS NEEDED FOR MUSCLE SPASMS,( SEDATION CAUTION)   Evolocumab (REPATHA SURECLICK) 140 MG/ML SOAJ Inject 140 mg into the skin every 14 (fourteen) days.   furosemide (LASIX) 20 MG tablet Take 1 tablet (20 mg total) by mouth daily as needed for fluid.   methocarbamol (ROBAXIN) 500 MG tablet Take 1 tablet by mouth daily as needed for spasms.   metoprolol succinate (TOPROL-XL) 100 MG 24 hr tablet Take 1 tablet (100 mg total) by mouth daily. Take with or immediately following a meal.   Multiple Vitamin (MULTIVITAMIN WITH MINERALS) TABS tablet Take 1 tablet by mouth 2 (two) times daily.    potassium chloride (KLOR-CON) 10 MEQ tablet Take 1 tablet (10 mEq total) by mouth daily.   rosuvastatin (CRESTOR) 10 MG tablet Take 1 tablet (10 mg total) by mouth daily.   sacubitril-valsartan (ENTRESTO) 49-51 MG Take 2 tablets by mouth 2 (two) times daily.   sacubitril-valsartan (ENTRESTO) 97-103 MG Take 1 tablet by mouth 2 (two) times daily.   traMADol (ULTRAM) 50 MG tablet Take 1 tablet by mouth daily as needed for pain. MAX OF 4 TABLETS PER DAY   [DISCONTINUED] metoprolol tartrate (LOPRESSOR) 50 MG tablet TAKE ONE TABLET BY MOUTH TWICE DAILY   [DISCONTINUED] sacubitril-valsartan (ENTRESTO) 49-51 MG Take 1 tablet by mouth 2 (two) times daily.   [DISCONTINUED] sacubitril-valsartan (ENTRESTO) 49-51 MG Take 1 tablet by mouth 2 (two) times daily.    Allergies:   Carvedilol and Lipitor [atorvastatin]   Social History    Socioeconomic History   Marital status: Married    Spouse name: Not on file   Number of children: 0   Years of education: Not on file   Highest education level: Not on file  Occupational History   Occupation: retired  Tobacco Use   Smoking status: Former    Current packs/day: 0.00    Average packs/day: 1 pack/day for 25.0 years (25.0 ttl pk-yrs)    Types: Cigarettes    Start date: 02/05/1965    Quit date: 02/05/1990    Years since quitting: 33.2   Smokeless tobacco: Former    Types: Snuff    Quit date: 01/05/2012   Tobacco comments:    Smoked for 20 years.  Vaping Use   Vaping status: Never Used  Substance and Sexual Activity   Alcohol use: Yes    Alcohol/week: 6.0 standard drinks of alcohol    Types: 6 Cans of beer per week    Comment: 6 beers a week on average   Drug use: No   Sexual activity: Yes    Birth control/protection: Surgical    Comment: Vasectomy  Other Topics Concern   Not on file  Social History Narrative   Prev worked at [employer] with frequent long travel.     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     Married 2000, 2 adult step kids, 3 grandkids.     Social Drivers of Corporate investment banker Strain: Not on file  Food Insecurity: Not on file  Transportation Needs: Not on file  Physical Activity: Not on file  Stress: Not on file  Social Connections: Unknown (06/20/2021)   [facility]   Social Network    Social Network: Not on file     Family History:  The patient's family history includes Alzheimer's disease in his paternal grandmother; Colon polyps in his brother; Heart attack in his maternal grandfather and paternal grandfather; Heart disease in his brother and father; Hyperlipidemia in his brother, brother, father, and mother; Hypertension in his brother, brother, and father; Kidney disease in his brother; Leukemia in his father; Parkinsonism in his mother; Stroke in his mother. There is no history of  Prostate cancer, Colon cancer, Rectal cancer, or Stomach cancer.  ROS:   12-point review of systems is negative unless otherwise noted in the HPI.   EKGs/Labs/Other Studies Reviewed:    Studies reviewed were summarized above. The additional studies were reviewed today:  2D echo 07/24/2022: 1. Left ventricular ejection fraction, by estimation, is 45 to 50%. The  left ventricle has mildly decreased function. The left ventricle  demonstrates regional wall motion abnormalities, Inferior hypokinesis.  There is mild left ventricular hypertrophy.  Left ventricular diastolic parameters were normal.   2. Right ventricular systolic function is mildly reduced. The right  ventricular size is mildly enlarged. There is normal pulmonary artery  systolic pressure. The estimated right ventricular systolic pressure is  33.5 mmHg.   3. Left atrial size was moderately dilated.   4. Right atrial size was moderately dilated.   5. The mitral valve is normal in structure. Trivial mitral valve  regurgitation.   6. The aortic valve is tricuspid. Aortic valve regurgitation is mild to  moderate. No aortic stenosis is present.   7. Aortic dilatation noted. There is dilatation of the aortic root,  measuring 43 mm.   8. The inferior vena cava is normal in size with greater than 50%  respiratory variability, suggesting right atrial pressure of 3 mmHg.  __________   2D echo 03/19/2023: 1. Left ventricular ejection fraction, by estimation, is 35 to 40%. Left  ventricular ejection fraction by 2D MOD biplane is 38.4 %. The left  ventricle has moderately decreased function. The left ventricle  demonstrates global hypokinesis. There is mild  left ventricular hypertrophy. Left ventricular diastolic parameters are  indeterminate.   2. Right ventricular systolic function is moderately reduced. The right  ventricular size is normal. There is normal pulmonary artery systolic  pressure. The estimated right ventricular  systolic pressure is 21.0 mmHg.   3. Left atrial size was mildly dilated.   4. The mitral valve is normal in structure. No evidence of mitral valve  regurgitation. No evidence of mitral stenosis.   5. The  aortic valve has an indeterminant number of cusps. There is mild  calcification of the aortic valve. Aortic valve regurgitation is mild to  moderate. Aortic valve sclerosis is present, with no evidence of aortic  valve stenosis.   6. There is mild dilatation of the aortic root, measuring 43 mm.   7. The inferior vena cava is dilated in size with >50% respiratory  variability, suggesting right atrial pressure of 8 mmHg.   Comparison(s): Previous LVEF reported as 45-50%.  __________  Coronary CTA 04/04/2023: FINDINGS: Aorta: S/p ascending aorta repair. Dilated aortic root, 51 mm in maximum diameter. Aortic wall calcifications. No dissection.   Aortic Valve:  Trileaflet. mild calcifications.   Coronary Arteries:  Normal coronary origin.  Right dominance.   RCA is a dominant artery. There is calcified plaque proximally causing minimal stenosis (<25%).   Left main gives rise to LAD and LCX arteries. LM has no disease.   LAD has calcified plaque proximally causing mild stenosis (25%).   LCX is a non-dominant artery.  There is no plaque.   Other findings:   Normal pulmonary vein drainage into the left atrium.   Normal left atrial appendage without a thrombus.   Normal size of the pulmonary artery.   IMPRESSION: 1. Coronary calcium score of 417. This was 80th percentile for age and sex matched control. 2. Normal coronary origin with right dominance. 3. Mild proximal LAD stenosis (25%). 4. Minimal proximal RCA stenosis (<25%). 5. CAD-RADS 2. Mild non-obstructive CAD (25-49%). Consider non-atherosclerotic causes of chest pain. Consider preventive therapy and risk factor modification. 6. S/p ascending aorta repair. Dilated aortic root, 51 mm in  maximum diameter.   Noncardiac overread: IMPRESSION: *No acute cardiopulmonary process. *Stable dissection of the descending thoracic aorta. *Stable prominence of the interstitial markings with chronic left lower lobe hypoventilatory atelectatic changes without evidence of acute infiltrates or consolidations.   EKG:  EKG is not ordered today.    Recent Labs: 03/11/2023: ALT 41; Hemoglobin 15.4; Platelets 157.0; Pro B Natriuretic peptide (BNP) 1,377.0; TSH 1.71 03/29/2023: BUN 24; Creatinine, Ser 1.29; Potassium 4.1; Sodium 134  Recent Lipid Panel    Component Value Date/Time   CHOL 178 06/21/2022 1620   TRIG 106 06/21/2022 1620   HDL 67 06/21/2022 1620   CHOLHDL 2.7 06/21/2022 1620   CHOLHDL 3 08/31/2019 0847   VLDL 22.6 08/31/2019 0847   LDLCALC 92 06/21/2022 1620   LDLDIRECT 142.8 08/31/2011 0835    PHYSICAL EXAM:    VS:  BP 127/78   Pulse 66   Wt 213 lb (96.6 kg)   SpO2 98%   BMI 33.36 kg/m   BMI: Body mass index is 33.36 kg/m.  Physical Exam Vitals reviewed.  Constitutional:      Appearance: He is well-developed.  HENT:     Head: Normocephalic and atraumatic.  Eyes:     General:        Right eye: No discharge.        Left eye: No discharge.  Cardiovascular:     Rate and Rhythm: Normal rate and regular rhythm.     Heart sounds: S1 normal and S2 normal. Heart sounds not distant. No midsystolic click and no opening snap. Murmur heard.     Diastolic murmur is present with a grade of 1/4 at the upper right sternal border.     No friction rub.  Pulmonary:     Effort: Pulmonary effort is normal. No respiratory distress.     Breath sounds: Normal breath  sounds. No decreased breath sounds, wheezing, rhonchi or rales.  Chest:     Chest wall: No tenderness.  Musculoskeletal:     Cervical back: Normal range of motion.  Skin:    General: Skin is warm and dry.     Nails: There is no clubbing.  Neurological:     Mental Status: He is alert and oriented to person,  place, and time.  Psychiatric:        Speech: Speech normal.        Behavior: Behavior normal.        Thought Content: Thought content normal.        Judgment: Judgment normal.     Wt Readings from Last 3 Encounters:  05/02/23 213 lb (96.6 kg)  03/29/23 220 lb 9.6 oz (100.1 kg)  03/12/23 217 lb 6.4 oz (98.6 kg)     ASSESSMENT & PLAN:   HFrEF secondary to NICM: Volume status continues to improve.  His weight is down 7 pounds today when compared to his visit in 03/2023.  Breathing is at baseline.  For now, he remains on Entresto 97/103 mg twice daily.  Stop Lopressor.  Start Toprol-XL 100 mg nightly.  Check BMP.  If renal function allows would look to escalate GDMT with addition of SGLT2 inhibitor and/or MRA to further optimize medications.  Anticipate repeating limited echo in several months time to reevaluate cardiomyopathy on maximally tolerated GDMT.  CAD involving the native coronary arteries without angina: He is without symptoms of angina.  Recent coronary CTA showed mild nonobstructive CAD as outlined above.  Continue aggressive risk factor modification and primary prevention including aspirin 81 mg, clopidogrel 75 mg, and with the initiation of Repatha as outlined below.  PVCs: Quiescent.  Metoprolol as above.  Aortic insufficiency/mitral regurgitation: Stable, mild to moderate aortic valve insufficiency by echo in 03/2023.  Monitor periodically.  Of note, he will be undergoing CT surgery evaluation for progression of dilated aortic root next month as outlined below.  Dilated aortic root and ascending aorta with a history of thoracoabdominal aortic dissection: Coronary CTA showed significant progression of dilated aortic root when compared to study from 10/2022 with a trend of 40 mm to 51 mm respectively.  He has a history of chronic type B thoracoabdominal aortic dissection with ascending thoracic aortic dissection status post tube graft repair with postoperative course complicated by  tamponade with hematoma and PEA arrest as well as PE and acute renal failure requiring hemodialysis.  Hemodynamically stable.  Scheduled to see CT surgery in early April.  HTN: Blood pressure blood pressure is well-controlled in the office today.  Continue pharmacotherapy as outlined above.  Ideally would aim to run blood pressures on the lower side as long as he remains asymptomatic.  HLD with statin intolerance: LDL 92 in 06/2022 Target LDL less than 70.  Initiating Repatha 140 mg every 2 weeks.  Anticipate fasting lipid panel in 2 months.  Cryptogenic CVA: No new deficits.  TEE and ILR unrevealing.  Rhythm is on clopidogrel.     Disposition: F/u with Dr. Elease Hashimoto or an APP in 2 months.   Medication Adjustments/Labs and Tests Ordered: Current medicines are reviewed at length with the patient today.  Concerns regarding medicines are outlined above. Medication changes, Labs and Tests ordered today are summarized above and listed in the Patient Instructions accessible in Encounters.   Signed, Eula Listen, PA-C 05/02/2023 5:02 PM     [facility] [place] [address]  [place], [place] [address] [phone]

## 2023-05-02 ENCOUNTER — Ambulatory Visit: Payer: Medicare Other | Attending: Physician Assistant | Admitting: Physician Assistant

## 2023-05-02 ENCOUNTER — Encounter: Payer: Self-pay | Admitting: Physician Assistant

## 2023-05-02 VITALS — BP 127/78 | HR 66 | Wt 213.0 lb

## 2023-05-02 DIAGNOSIS — I77810 Thoracic aortic ectasia: Secondary | ICD-10-CM | POA: Insufficient documentation

## 2023-05-02 DIAGNOSIS — I351 Nonrheumatic aortic (valve) insufficiency: Secondary | ICD-10-CM | POA: Insufficient documentation

## 2023-05-02 DIAGNOSIS — E785 Hyperlipidemia, unspecified: Secondary | ICD-10-CM | POA: Insufficient documentation

## 2023-05-02 DIAGNOSIS — Z79899 Other long term (current) drug therapy: Secondary | ICD-10-CM | POA: Diagnosis present

## 2023-05-02 DIAGNOSIS — I639 Cerebral infarction, unspecified: Secondary | ICD-10-CM | POA: Insufficient documentation

## 2023-05-02 DIAGNOSIS — I428 Other cardiomyopathies: Secondary | ICD-10-CM | POA: Insufficient documentation

## 2023-05-02 DIAGNOSIS — I2510 Atherosclerotic heart disease of native coronary artery without angina pectoris: Secondary | ICD-10-CM | POA: Insufficient documentation

## 2023-05-02 DIAGNOSIS — I493 Ventricular premature depolarization: Secondary | ICD-10-CM | POA: Diagnosis present

## 2023-05-02 DIAGNOSIS — I7103 Dissection of thoracoabdominal aorta: Secondary | ICD-10-CM | POA: Insufficient documentation

## 2023-05-02 DIAGNOSIS — I340 Nonrheumatic mitral (valve) insufficiency: Secondary | ICD-10-CM | POA: Insufficient documentation

## 2023-05-02 DIAGNOSIS — I10 Essential (primary) hypertension: Secondary | ICD-10-CM | POA: Insufficient documentation

## 2023-05-02 DIAGNOSIS — I5020 Unspecified systolic (congestive) heart failure: Secondary | ICD-10-CM | POA: Insufficient documentation

## 2023-05-02 MED ORDER — SACUBITRIL-VALSARTAN 49-51 MG PO TABS: 2.0000 | ORAL_TABLET | Freq: Two times a day (BID) | ORAL | 0 refills | Status: DC

## 2023-05-02 MED ORDER — SACUBITRIL-VALSARTAN 97-103 MG PO TABS: 1.0000 | ORAL_TABLET | Freq: Two times a day (BID) | ORAL | 3 refills | Status: AC

## 2023-05-02 MED ORDER — REPATHA SURECLICK 140 MG/ML ~~LOC~~ SOAJ: 140.0000 mg | SUBCUTANEOUS | 3 refills | Status: DC

## 2023-05-02 MED ORDER — METOPROLOL SUCCINATE ER 100 MG PO TB24: 100.0000 mg | ORAL_TABLET | Freq: Every day | ORAL | 3 refills | Status: AC

## 2023-05-02 NOTE — Patient Instructions (Signed)
 Medication Instructions:  Your physician recommends the following medication changes.  STOP TAKING: Lopressor  START TAKING: Repatha 140 mg every 2 weeks Toprol-XL 100 mg nightly  *If you need a refill on your cardiac medications before your next appointment, please call your pharmacy*   Lab Work: Your provider would like for you to have following labs drawn today BMeT.   If you have labs (blood work) drawn today and your tests are completely normal, you will receive your results only by: MyChart Message (if you have MyChart) OR A paper copy in the mail If you have any lab test that is abnormal or we need to change your treatment, we will call you to review the results.   Follow-Up: At [facility], you and your health needs are our priority.  As part of our continuing mission to provide you with exceptional heart care, we have created designated Provider Care Teams.  These Care Teams include your primary Cardiologist (physician) and Advanced Practice Providers (APPs -  Physician Assistants and Nurse Practitioners) who all work together to provide you with the care you need, when you need it.   Your next appointment:   2 month(s)  Provider:   You may see Kristeen Miss, MD or Eula Listen, PA-C

## 2023-05-03 LAB — BASIC METABOLIC PANEL WITH GFR
BUN/Creatinine Ratio: 17 (ref 10–24)
BUN: 21 mg/dL (ref 8–27)
CO2: 24 mmol/L (ref 20–29)
Calcium: 10.3 mg/dL — ABNORMAL HIGH (ref 8.6–10.2)
Chloride: 93 mmol/L — ABNORMAL LOW (ref 96–106)
Creatinine, Ser: 1.27 mg/dL (ref 0.76–1.27)
Glucose: 108 mg/dL — ABNORMAL HIGH (ref 70–99)
Potassium: 3.9 mmol/L (ref 3.5–5.2)
Sodium: 134 mmol/L (ref 134–144)
eGFR: 63 mL/min/{1.73_m2} (ref >59)

## 2023-05-06 ENCOUNTER — Other Ambulatory Visit: Payer: Self-pay | Admitting: Emergency Medicine

## 2023-05-06 DIAGNOSIS — I493 Ventricular premature depolarization: Secondary | ICD-10-CM

## 2023-05-06 DIAGNOSIS — Z79899 Other long term (current) drug therapy: Secondary | ICD-10-CM

## 2023-05-06 MED ORDER — SPIRONOLACTONE 25 MG PO TABS: 12.5000 mg | ORAL_TABLET | Freq: Every day | ORAL | 3 refills | Status: DC

## 2023-05-06 MED ORDER — FUROSEMIDE 20 MG PO TABS: 20.0000 mg | ORAL_TABLET | ORAL | Status: AC

## 2023-05-07 ENCOUNTER — Telehealth: Payer: Self-pay

## 2023-05-07 ENCOUNTER — Other Ambulatory Visit: Payer: Self-pay | Admitting: Emergency Medicine

## 2023-05-07 ENCOUNTER — Other Ambulatory Visit (HOSPITAL_COMMUNITY): Payer: Self-pay

## 2023-05-07 DIAGNOSIS — Z79899 Other long term (current) drug therapy: Secondary | ICD-10-CM

## 2023-05-07 MED ORDER — PRALUENT 75 MG/ML ~~LOC~~ SOAJ: 75.0000 mg | SUBCUTANEOUS | 3 refills | Status: DC

## 2023-05-07 NOTE — Telephone Encounter (Signed)
 Pharmacy Patient Advocate Encounter   Received notification from CoverMyMeds that prior authorization for REPATHA is required/requested.   Insurance verification completed.   The patient is insured through [facility] .   Per test claim:  PRALUENT is preferred by the insurance.  If suggested medication is appropriate, Please send in a new RX and discontinue this one. If not, please advise as to why it's not appropriate so that we may request a Prior Authorization. Please note, some preferred medications may still require a PA.  If the suggested medications have not been trialed and there are no contraindications to their use, the PA will not be submitted, as it will not be approved.

## 2023-05-15 ENCOUNTER — Institutional Professional Consult (permissible substitution) (INDEPENDENT_AMBULATORY_CARE_PROVIDER_SITE_OTHER): Admitting: Surgery

## 2023-05-15 ENCOUNTER — Encounter: Payer: Self-pay | Admitting: Surgery

## 2023-05-15 VITALS — BP 177/80 | HR 58 | Resp 18 | Ht 67.0 in | Wt 213.0 lb

## 2023-05-15 DIAGNOSIS — I712 Thoracic aortic aneurysm, without rupture, unspecified: Secondary | ICD-10-CM | POA: Diagnosis not present

## 2023-05-15 DIAGNOSIS — I7100 Dissection of unspecified site of aorta: Secondary | ICD-10-CM | POA: Diagnosis not present

## 2023-05-15 NOTE — Progress Notes (Unsigned)
 Cardiothoracic Surgery Consultation  PCP is Joaquim Nam, MD Referring Provider is Waynette Buttery*  Chief Complaint  Patient presents with  . Thoracic Aortic Aneurysm    Root dilatation, ECHO 2/11, CT coronary 2/27    HPI:   Past Medical History:  Diagnosis Date  . Aortic dissection (HCC)   . Cardiac tamponade 2013  . Colon polyps 2017  . CVA (cerebral infarction)   . HLD (hyperlipidemia)   . Hyperglycemia    a. A1C 5.9 in Sept 2013.  Marland Kitchen Hypertension   . PE (pulmonary embolism)   . RBBB   . Renal insufficiency 08/28/2012  . Sleep apnea    history of    Past Surgical History:  Procedure Laterality Date  . CARPAL TUNNEL RELEASE Right 2004  . COLONOSCOPY    . INSERTION OF DIALYSIS CATHETER  11/06/2011   Procedure: INSERTION OF DIALYSIS CATHETER;  Surgeon: Kerin Perna, MD;  Location: [facility] OR;  Service: Thoracic;  Laterality: Right;  . INSERTION OF DIALYSIS CATHETER  11/13/2011   Procedure: INSERTION OF DIALYSIS CATHETER;  Surgeon: Sherren Kerns, MD;  Location: [facility] OR;  Service: Vascular;  Laterality: N/A;  Ultrasound guided.  Marland Kitchen LEFT HEART CATHETERIZATION WITH CORONARY ANGIOGRAM N/A 10/30/2011   Procedure: LEFT HEART CATHETERIZATION WITH CORONARY ANGIOGRAM;  Surgeon: Kathleene Hazel, MD;  Location: [facility] CATH LAB;  Service: Cardiovascular;  Laterality: N/A;  . LIGAMENT REPAIR Left 1981   Knee  . LOOP RECORDER IMPLANT N/A 01/20/2014   MDT LINQ implanted by Dr Ladona Ridgel for cryptogenic stroke  . MEDIASTINAL EXPLORATION  11/06/2011   Procedure: MEDIASTINAL EXPLORATION;  Surgeon: Kerin Perna, MD;  Location: [facility] OR;  Service: Thoracic;  Laterality: N/A;  sternal closure; removal of wound vac  . POLYPECTOMY    . TEE WITHOUT CARDIOVERSION N/A 01/20/2014   Procedure: TRANSESOPHAGEAL ECHOCARDIOGRAM (TEE);  Surgeon: Donato Schultz, MD;  Location: [facility] ENDOSCOPY;  Service: Cardiovascular;  Laterality: N/A;  . THORACIC AORTIC ANEURYSM REPAIR  10/30/2011    Procedure: THORACIC ASCENDING ANEURYSM REPAIR (AAA);  Surgeon: Kerin Perna, MD;  Location: [facility] OR;  Service: Open Heart Surgery;  Laterality: N/A;  Ascending aortic dissection repair, arch reconstruction, aortic valve repair  . WISDOM TOOTH EXTRACTION      Family History  Problem Relation Age of Onset  . Parkinsonism Mother   . Hyperlipidemia Mother   . Stroke Mother   . Leukemia Father        AML at 40  . Heart disease Father        Angioplasty in his 35s, CABG age 14.  Marland Kitchen Hypertension Father   . Hyperlipidemia Father   . Colon polyps Brother   . Kidney disease Brother   . Heart disease Brother        CABG at age 56.  Marland Kitchen Hyperlipidemia Brother   . Hypertension Brother   . Hyperlipidemia Brother   . Hypertension Brother   . Heart attack Maternal Grandfather   . Alzheimer's disease Paternal Grandmother   . Heart attack Paternal Grandfather   . Prostate cancer Neg Hx   . Colon cancer Neg Hx   . Rectal cancer Neg Hx   . Stomach cancer Neg Hx     Social History Social History   Tobacco Use  . Smoking status: Former    Current packs/day: 0.00    Average packs/day: 1 pack/day for 25.0 years (25.0 ttl pk-yrs)    Types: Cigarettes    Start date: 02/05/1965  Quit date: 02/05/1990    Years since quitting: 33.2  . Smokeless tobacco: Former    Types: Snuff    Quit date: 01/05/2012  . Tobacco comments:    Smoked for 20 years.  Vaping Use  . Vaping status: Never Used  Substance Use Topics  . Alcohol use: Yes    Alcohol/week: 6.0 standard drinks of alcohol    Types: 6 Cans of beer per week    Comment: 6 beers a week on average  . Drug use: No    Current Outpatient Medications  Medication Sig Dispense Refill  . Alirocumab (PRALUENT) 75 MG/ML SOAJ Inject 1 mL (75 mg total) into the skin every 14 (fourteen) days. 6 mL 3  . ALPRAZolam (XANAX) 0.5 MG tablet TAKE 1 TABLET(0.5 MG) BY MOUTH TWICE DAILY AS NEEDED FOR ANXIETY 30 tablet 1  . aspirin 81 MG chewable tablet Chew 81 mg  by mouth daily.    . Cholecalciferol (VITAMIN D3) 50 MCG (2000 UT) TABS Take by mouth. daily    . clopidogrel (PLAVIX) 75 MG tablet TAKE ONE TABLET BY MOUTH ONE TIME DAILY 90 tablet 3  . cyclobenzaprine (FLEXERIL) 10 MG tablet TAKE 1/2 TO 1 TABLET BY MOUTH THREE TIMES DAILY AS NEEDED FOR MUSCLE SPASMS,( SEDATION CAUTION) 30 tablet 0  . furosemide (LASIX) 20 MG tablet Take 1 tablet (20 mg total) by mouth every other day.    . methocarbamol (ROBAXIN) 500 MG tablet Take 1 tablet by mouth daily as needed for spasms.  0  . metoprolol succinate (TOPROL-XL) 100 MG 24 hr tablet Take 1 tablet (100 mg total) by mouth daily. Take with or immediately following a meal. 90 tablet 3  . Multiple Vitamin (MULTIVITAMIN WITH MINERALS) TABS tablet Take 1 tablet by mouth 2 (two) times daily.     . rosuvastatin (CRESTOR) 10 MG tablet Take 1 tablet (10 mg total) by mouth daily. 90 tablet 3  . sacubitril-valsartan (ENTRESTO) 49-51 MG Take 2 tablets by mouth 2 (two) times daily. 56 tablet 0  . sacubitril-valsartan (ENTRESTO) 97-103 MG Take 1 tablet by mouth 2 (two) times daily. 180 tablet 3  . spironolactone (ALDACTONE) 25 MG tablet Take 0.5 tablets (12.5 mg total) by mouth daily. 45 tablet 3  . traMADol (ULTRAM) 50 MG tablet Take 1 tablet by mouth daily as needed for pain. MAX OF 4 TABLETS PER DAY  0   No current facility-administered medications for this visit.    Allergies  Allergen Reactions  . Carvedilol     "felt terrible", headache  . Lipitor [Atorvastatin]     aches    Review of Systems  BP (!) 177/80 (BP Location: Left Arm)   Pulse (!) 58   Resp 18   Ht 5\' 7"  (1.702 m)   Wt 213 lb (96.6 kg)   SpO2 98%   BMI 33.36 kg/m  Physical Exam   Diagnostic Tests:   Impression:   Plan:   Alleen Borne, MD [facility] [phone]

## 2023-05-16 ENCOUNTER — Other Ambulatory Visit (HOSPITAL_COMMUNITY): Payer: Self-pay

## 2023-05-16 ENCOUNTER — Telehealth: Payer: Self-pay | Admitting: Pharmacy Technician

## 2023-05-16 NOTE — Telephone Encounter (Signed)
 Pharmacy Patient Advocate Encounter   Received notification from CoverMyMeds that prior authorization for Praluent is required/requested.   Insurance verification completed.   The patient is insured through [facility] .   Per test claim: PA required; PA submitted to above mentioned insurance via CoverMyMeds Key/confirmation #/EOC [id number] Status is pending

## 2023-05-17 ENCOUNTER — Other Ambulatory Visit (HOSPITAL_COMMUNITY): Payer: Self-pay

## 2023-05-17 NOTE — Telephone Encounter (Signed)
 Pharmacy Patient Advocate Encounter  Received notification from [facility] that Prior Authorization for Praluent has been APPROVED from 05/02/23 to until further notice. Unable to obtain price due to refill too soon rejection, last fill date 05/07/23 next available fill date04/21/25   PA #/Case ID/Reference #: [id number]

## 2023-06-04 LAB — BASIC METABOLIC PANEL WITH GFR
BUN/Creatinine Ratio: 14 (ref 10–24)
BUN: 16 mg/dL (ref 8–27)
CO2: 22 mmol/L (ref 20–29)
Calcium: 10.1 mg/dL (ref 8.6–10.2)
Chloride: 102 mmol/L (ref 96–106)
Creatinine, Ser: 1.15 mg/dL (ref 0.76–1.27)
Glucose: 104 mg/dL — ABNORMAL HIGH (ref 70–99)
Potassium: 4.8 mmol/L (ref 3.5–5.2)
Sodium: 139 mmol/L (ref 134–144)
eGFR: 71 mL/min/{1.73_m2} (ref >59)

## 2023-06-28 NOTE — Progress Notes (Unsigned)
 Cardiology Office Note    Date:  07/02/2023   ID:  HOA Gabriel Tran, DOB Jun 12, 1955, MRN [id number]  PCP:  Donnie Galea, MD  Cardiologist:  Ahmad Alert, MD  Electrophysiologist:  None   Chief Complaint: Follow-up   History of Present Illness:   Gabriel Tran is a 68 y.o. male with history of nonobstructive CAD by coronary CTA in 03/2023, HFrEF secondary to NICM, ascending aortic dissection in the setting of uncontrolled hypertension status post repair complicated by tamponade with PEA arrest and PE in 2013, dilated aortic root and ascending aorta, cryptogenic CVA, HTN, HLD, and prior tobacco use who presents for follow-up of CAD and cardiomyopathy.   He underwent initial emergent ascending thoracic aortic dissection repair on 10/30/2011, in the setting of uncontrolled hypertension.  Postop course was complicated by tamponade/hematoma evacuation on 11/02/2012 with PEA arrest and PE.  LHC showed no evidence of obstructive CAD.  Hospital course was further complicated by renal failure requiring dialysis.  He was admitted in 2015 with left brain stroke with right hemiparesis.  Initial blood pressure of 200/115.  He was treated with tPA.  MRI showed a left frontal middle cerebral artery branch infarct felt to be possibly embolic.  Surface echo showed an EF of 60 to 65%, no regional wall motion neurologist, and mild aortic insufficiency.  He was discharged on clopidogrel .  He subsequently underwent TEE that showed no evidence of emboli.  ILR was implanted that showed no evidence of arrhythmia.   He was seen in 06/2022 noting exertional dyspnea.  Echo from 07/2022 showed an EF of 45 to 50%, inferior hypokinesis, mild LVH, normal LV diastolic function parameters, mildly reduced RV systolic function mildly enlarged ventricular cavity size and normal RVSP, moderate biatrial enlargement, trivial mitral regurgitation, mild to moderate aortic insufficiency, dilated aortic root measuring 43 mm, and an  estimated right atrial pressure of 3 mmHg.   CTA chest/aorta in 10/2022 showed stable appearance of previously visualized chronic type B thoracoabdominal aortic dissection as well as similar appearing postsurgical changes after ascending aortic tube graft repair without complicating features.  Slight interval dilatation of the aortic root with the sinuses of Valsalva measuring up to 4 cm (previously 3.8 cm), mild global cardiomegaly, and moderate three-vessel coronary artery calcification.   He was seen at his PCP's office on 03/11/2023 and reported progressive dyspnea and orthopnea with associated palpitations that began around 02/22/2023.  He was advised to take a dose of Lasix  20 mg.  Labs obtained at that time showed a BNP of 1377.  EKG obtained at that time demonstrated sinus rhythm with a known right bundle branch block with occasional PVCs.   He was evaluated in our office on 03/12/2023 noting shortness of breath that initially began in 01/2022 that was relatively stable up until the past several weeks.  In 02/2023, he developed significant shortness of breath with exertional fatigue without frank chest pain.  In this setting he had noted progressive orthopnea.  No significant lower extremity swelling.  Weight had been largely stable around 220 pounds throughout the above.  It was recommended he titrate furosemide  to 40 mg daily for 2 days followed by resumption of 20 mg daily thereafter.  Echo on 03/19/2023 showed an EF of 35 to 40%, global hypokinesis, mild LVH, moderately reduced RV systolic function with normal ventricular cavity size and RVSP, mildly dilated left atrium, mild to moderate aortic valve insufficiency with aortic valve sclerosis without evidence of stenosis, mild dilatation of the  aortic root measuring 43 mm, and an estimated right atrial pressure of 8 mmHg.  He was seen in the office on 03/29/2023 feeling like his breathing was back to baseline.  However, he did note some exertional shortness  of breath with overexertion.  Orthopnea was improved.  Weight remained stable at home around 220 pounds.  He was taking furosemide  20 mg daily with good urine output.  He had self discontinued rosuvastatin  due to concerns of cognitive decline.  Losartan  was stopped and he was started on Entresto  49/51 mg twice daily with continuation of Lopressor  50 mg twice daily with plans to consolidate this to Toprol -XL moving forward.  After discussion with interventional cardiology, given his history of thoracoabdominal aortic dissection status post repair, we pursued coronary CTA on 04/04/2023 that showed a calcium  score of 417 which was the 80th percentile.  There was 25% proximal LAD stenosis and less than 25% proximal RCA stenosis.  Aortic root dilated, measuring 51 mm.  Noncardiac overread showed stable dissection of the descending thoracic aorta with dilated aortic root measuring 51 mm.  He was referred to CT surgery for ongoing evaluation patient for continued close monitoring with overall stable dilated aortic root measuring 5.1 cm that was unchanged from study in 10/2022.  He comes in doing well from a cardiac perspective and remains without symptoms of angina or cardiac decompensation.  Dyspnea is largely unchanged.  No significant lower extremity swelling or progressive orthopnea.  Weight largely stable.  Continues to note good urine output on furosemide .  Adherent and tolerating cardiac pharmacotherapy.  Worked out in the yard most of the weekend with stable dyspnea.   Labs independently reviewed: 05/2023 -BUN 16, serum creatinine 1.15, potassium 4.8 03/2023 - BNP 1377, TSH normal, albumin  4.2, AST/ALT normal, Hgb 15.4, PLT 157 06/2022 - TC 170, TG 106, HDL 67, LDL 92  Past Medical History:  Diagnosis Date   Aortic dissection (HCC)    Cardiac tamponade 2013   Colon polyps 2017   CVA (cerebral infarction)    HLD (hyperlipidemia)    Hyperglycemia    a. A1C 5.9 in Sept 2013.   Hypertension    PE  (pulmonary embolism)    RBBB    Renal insufficiency 08/28/2012   Sleep apnea    history of    Past Surgical History:  Procedure Laterality Date   CARPAL TUNNEL RELEASE Right 2004   COLONOSCOPY     INSERTION OF DIALYSIS CATHETER  11/06/2011   Procedure: INSERTION OF DIALYSIS CATHETER;  Surgeon: Heriberto London, MD;  Location: [facility] OR;  Service: Thoracic;  Laterality: Right;   INSERTION OF DIALYSIS CATHETER  11/13/2011   Procedure: INSERTION OF DIALYSIS CATHETER;  Surgeon: Richrd Char, MD;  Location: [facility] OR;  Service: Vascular;  Laterality: N/A;  Ultrasound guided.   LEFT HEART CATHETERIZATION WITH CORONARY ANGIOGRAM N/A 10/30/2011   Procedure: LEFT HEART CATHETERIZATION WITH CORONARY ANGIOGRAM;  Surgeon: Odie Benne, MD;  Location: [facility] CATH LAB;  Service: Cardiovascular;  Laterality: N/A;   LIGAMENT REPAIR Left 1981   Knee   LOOP RECORDER IMPLANT N/A 01/20/2014   MDT LINQ implanted by Dr Carolynne Citron for cryptogenic stroke   MEDIASTINAL EXPLORATION  11/06/2011   Procedure: MEDIASTINAL EXPLORATION;  Surgeon: Heriberto London, MD;  Location[facility] OR;  Service: Thoracic;  Laterality: N/A;  sternal closure; removal of wound vac   POLYPECTOMY     TEE WITHOUT CARDIOVERSION N/A 01/20/2014   Procedure: TRANSESOPHAGEAL ECHOCARDIOGRAM (TEE);  Surgeon: Dorothye Gathers, MD;  Location: [facility] ENDOSCOPY;  Service: Cardiovascular;  Laterality: N/A;   THORACIC AORTIC ANEURYSM REPAIR  10/30/2011   Procedure: THORACIC ASCENDING ANEURYSM REPAIR (AAA);  Surgeon: Heriberto London, MD;  Location: [facility] OR;  Service: Open Heart Surgery;  Laterality: N/A;  Ascending aortic dissection repair, arch reconstruction, aortic valve repair   WISDOM TOOTH EXTRACTION      Current Medications: Current Meds  Medication Sig   Alirocumab  (PRALUENT ) 75 MG/ML SOAJ Inject 1 mL (75 mg total) into the skin every 14 (fourteen) days.   ALPRAZolam  (XANAX ) 0.5 MG tablet TAKE 1 TABLET(0.5 MG) BY MOUTH TWICE DAILY AS NEEDED FOR ANXIETY    aspirin  81 MG chewable tablet Chew 81 mg by mouth daily.   Cholecalciferol (VITAMIN D3) 50 MCG (2000 UT) TABS Take by mouth. daily   clopidogrel  (PLAVIX ) 75 MG tablet TAKE ONE TABLET BY MOUTH ONE TIME DAILY   cyclobenzaprine  (FLEXERIL ) 10 MG tablet TAKE 1/2 TO 1 TABLET BY MOUTH THREE TIMES DAILY AS NEEDED FOR MUSCLE SPASMS,( SEDATION CAUTION)   furosemide  (LASIX ) 20 MG tablet Take 1 tablet (20 mg total) by mouth every other day.   methocarbamol (ROBAXIN) 500 MG tablet Take 1 tablet by mouth daily as needed for spasms.   metoprolol  succinate (TOPROL -XL) 100 MG 24 hr tablet Take 1 tablet (100 mg total) by mouth daily. Take with or immediately following a meal.   Multiple Vitamin (MULTIVITAMIN WITH MINERALS) TABS tablet Take 1 tablet by mouth 2 (two) times daily.    rosuvastatin  (CRESTOR ) 10 MG tablet Take 1 tablet (10 mg total) by mouth daily.   sacubitril -valsartan  (ENTRESTO ) 97-103 MG Take 1 tablet by mouth 2 (two) times daily.   traMADol  (ULTRAM ) 50 MG tablet Take 1 tablet by mouth daily as needed for pain. MAX OF 4 TABLETS PER DAY   [DISCONTINUED] spironolactone  (ALDACTONE ) 25 MG tablet Take 0.5 tablets (12.5 mg total) by mouth daily.    Allergies:   Carvedilol  and Lipitor [atorvastatin]   Social History   Socioeconomic History   Marital status: Married    Spouse name: Not on file   Number of children: 0   Years of education: Not on file   Highest education level: Not on file  Occupational History   Occupation: retired  Tobacco Use   Smoking status: Former    Current packs/day: 0.00    Average packs/day: 1 pack/day for 25.0 years (25.0 ttl pk-yrs)    Types: Cigarettes    Start date: 02/05/1965    Quit date: 02/05/1990    Years since quitting: 33.4   Smokeless tobacco: Former    Types: Snuff    Quit date: 01/05/2012   Tobacco comments:    Smoked for 20 years.  Vaping Use   Vaping status: Never Used  Substance and Sexual Activity   Alcohol use: Yes    Alcohol/week: 6.0 standard  drinks of alcohol    Types: 6 Cans of beer per week    Comment: 6 beers a week on average   Drug use: No   Sexual activity: Yes    Birth control/protection: Surgical    Comment: Vasectomy  Other Topics Concern   Not on file  Social History Narrative   Prev worked at [employer] with frequent long travel.     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     Married 2000, 2 adult step kids, 3 grandkids.     Social Drivers of Corporate investment banker Strain: Not on [employer]  Insecurity: Not on file  Transportation Needs: Not on file  Physical Activity: Not on file  Stress: Not on file  Social Connections: Unknown (06/20/2021)   [facility]   Social Network    Social Network: Not on file     Family History:  The patient's family history includes Alzheimer's disease in his paternal grandmother; Colon polyps in his brother; Heart attack in his maternal grandfather and paternal grandfather; Heart disease in his brother and father; Hyperlipidemia in his brother, brother, father, and mother; Hypertension in his brother, brother, and father; Kidney disease in his brother; Leukemia in his father; Parkinsonism in his mother; Stroke in his mother. There is no history of Prostate cancer, Colon cancer, Rectal cancer, or Stomach cancer.  ROS:   12-point review of systems is negative unless otherwise noted in the HPI.   EKGs/Labs/Other Studies Reviewed:    Studies reviewed were summarized above. The additional studies were reviewed today:  2D echo 07/24/2022: 1. Left ventricular ejection fraction, by estimation, is 45 to 50%. The  left ventricle has mildly decreased function. The left ventricle  demonstrates regional wall motion abnormalities, Inferior hypokinesis.  There is mild left ventricular hypertrophy.  Left ventricular diastolic parameters were normal.   2. Right ventricular systolic function is mildly reduced. The right  ventricular size is mildly  enlarged. There is normal pulmonary artery  systolic pressure. The estimated right ventricular systolic pressure is  33.5 mmHg.   3. Left atrial size was moderately dilated.   4. Right atrial size was moderately dilated.   5. The mitral valve is normal in structure. Trivial mitral valve  regurgitation.   6. The aortic valve is tricuspid. Aortic valve regurgitation is mild to  moderate. No aortic stenosis is present.   7. Aortic dilatation noted. There is dilatation of the aortic root,  measuring 43 mm.   8. The inferior vena cava is normal in size with greater than 50%  respiratory variability, suggesting right atrial pressure of 3 mmHg.  __________   2D echo 03/19/2023: 1. Left ventricular ejection fraction, by estimation, is 35 to 40%. Left  ventricular ejection fraction by 2D MOD biplane is 38.4 %. The left  ventricle has moderately decreased function. The left ventricle  demonstrates global hypokinesis. There is mild  left ventricular hypertrophy. Left ventricular diastolic parameters are  indeterminate.   2. Right ventricular systolic function is moderately reduced. The right  ventricular size is normal. There is normal pulmonary artery systolic  pressure. The estimated right ventricular systolic pressure is 21.0 mmHg.   3. Left atrial size was mildly dilated.   4. The mitral valve is normal in structure. No evidence of mitral valve  regurgitation. No evidence of mitral stenosis.   5. The aortic valve has an indeterminant number of cusps. There is mild  calcification of the aortic valve. Aortic valve regurgitation is mild to  moderate. Aortic valve sclerosis is present, with no evidence of aortic  valve stenosis.   6. There is mild dilatation of the aortic root, measuring 43 mm.   7. The inferior vena cava is dilated in size with >50% respiratory  variability, suggesting right atrial pressure of 8 mmHg.   Comparison(s): Previous LVEF reported as 45-50%.  __________    Coronary CTA 04/04/2023: FINDINGS: Aorta: S/p ascending aorta repair. Dilated aortic root, 51 mm in maximum diameter. Aortic wall calcifications. No dissection.   Aortic Valve:  Trileaflet. mild calcifications.   Coronary Arteries:  Normal coronary  origin.  Right dominance.   RCA is a dominant artery. There is calcified plaque proximally causing minimal stenosis (<25%).   Left main gives rise to LAD and LCX arteries. LM has no disease.   LAD has calcified plaque proximally causing mild stenosis (25%).   LCX is a non-dominant artery.  There is no plaque.   Other findings:   Normal pulmonary vein drainage into the left atrium.   Normal left atrial appendage without a thrombus.   Normal size of the pulmonary artery.   IMPRESSION: 1. Coronary calcium  score of 417. This was 80th percentile for age and sex matched control. 2. Normal coronary origin with right dominance. 3. Mild proximal LAD stenosis (25%). 4. Minimal proximal RCA stenosis (<25%). 5. CAD-RADS 2. Mild non-obstructive CAD (25-49%). Consider non-atherosclerotic causes of chest pain. Consider preventive therapy and risk factor modification. 6. S/p ascending aorta repair. Dilated aortic root, 51 mm in maximum diameter.     Noncardiac overread: IMPRESSION: *No acute cardiopulmonary process. *Stable dissection of the descending thoracic aorta. *Stable prominence of the interstitial markings with chronic left lower lobe hypoventilatory atelectatic changes without evidence of acute infiltrates or consolidations.   EKG:  EKG is ordered today.  The EKG ordered today demonstrates sinus bradycardia, 53 bpm, RBBB, consistent with prior tracing  Recent Labs: 03/11/2023: ALT 41; Hemoglobin 15.4; Platelets 157.0; Pro B Natriuretic peptide (BNP) 1,377.0; TSH 1.71 06/03/2023: BUN 16; Creatinine, Ser 1.15; Potassium 4.8; Sodium 139  Recent Lipid Panel    Component Value Date/Time   CHOL 178 06/21/2022 1620   TRIG 106  06/21/2022 1620   HDL 67 06/21/2022 1620   CHOLHDL 2.7 06/21/2022 1620   CHOLHDL 3 08/31/2019 0847   VLDL 22.6 08/31/2019 0847   LDLCALC 92 06/21/2022 1620   LDLDIRECT 142.8 08/31/2011 0835    PHYSICAL EXAM:    VS:  BP (!) 151/78   Pulse (!) 53   Ht 5\' 7"  (1.702 m)   Wt 216 lb 6.4 oz (98.2 kg)   SpO2 97%   BMI 33.89 kg/m   BMI: Body mass index is 33.89 kg/m.  Physical Exam Vitals reviewed.  Constitutional:      Appearance: He is well-developed.  HENT:     Head: Normocephalic and atraumatic.  Eyes:     General:        Right eye: No discharge.        Left eye: No discharge.  Neck:     Vascular: No JVD.  Cardiovascular:     Rate and Rhythm: Normal rate and regular rhythm.     Heart sounds: S1 normal and S2 normal. Heart sounds not distant. No midsystolic click and no opening snap. Murmur heard.     Diastolic murmur is present with a grade of 1/4 at the upper right sternal border.     No friction rub.  Pulmonary:     Effort: Pulmonary effort is normal. No respiratory distress.     Breath sounds: Normal breath sounds. No decreased breath sounds, wheezing, rhonchi or rales.  Chest:     Chest wall: No tenderness.  Musculoskeletal:     Cervical back: Normal range of motion.  Skin:    General: Skin is warm and dry.     Nails: There is no clubbing.  Neurological:     Mental Status: He is alert and oriented to person, place, and time.  Psychiatric:        Speech: Speech normal.        Behavior:  Behavior normal.        Thought Content: Thought content normal.        Judgment: Judgment normal.     Wt Readings from Last 3 Encounters:  07/02/23 216 lb 6.4 oz (98.2 kg)  05/15/23 213 lb (96.6 kg)  05/02/23 213 lb (96.6 kg)     ASSESSMENT & PLAN:   HFrEF secondary to NICM: Appears euvolemic and well compensated with NYHA class II symptoms.  Escalate GDMT with titration of spironolactone  to 25 mg daily with a follow-up BMP 1 week thereafter.  He will otherwise continue  Entresto  97/103 mg twice daily, Toprol -XL 100 mg, and furosemide  20 mg every other day.  Obtain limited echo in 6 weeks, if cardiomyopathy persists at that time recommend adding SGLT2 inhibitor.  CAD involving the native coronary arteries without angina: No symptoms suggestive of angina.  Recent coronary CTA showed mild nonobstructive CAD as outlined above.  Continue aggressive risk factor modification and primary prevention including aspirin  81 mg and Repatha .  PVCs: Quiescent.  Remains on metoprolol  as above.  Aortic insufficiency/mitral regurgitation: Stay well, mild to moderate aortic valve insufficiency by echo in 03/2023.  Anticipate follow-up echo in 03/2024.  Dilated aortic root and ascending aorta with history of thoracoabdominal aortic dissection: He has a history of chronic type B thoracoabdominal aortic dissection with ascending thoracic aortic dissection status post tube graft repair with postoperative course complicated by tamponade with hematoma and PEA arrest as well as PE and acute renal failure requiring hemodialysis.  Hemodynamically stable.  Followed by cardiothoracic surgery.  Stable by CT in 03/2023 when compared to imaging from 10/2022.  Follow-up with CT surgery as directed.  HTN: Blood pressure is mildly elevated in the office today, though typically well-controlled.  Escalate to spironolactone  with continuation of Toprol -XL and Entresto  as outlined above.  HLD with statin intolerance: LDL 92 in 06/2022 with target LDL less than 70.  Now on Praluent .  Anticipate follow-up labs at next visit.  Cryptogenic CVA: No new deficits.  TEE and ILR unrevealing.  Remains on clopidogrel .    Disposition: F/u with Dr. Alroy Aspen or an APP in 8 weeks.   Medication Adjustments/Labs and Tests Ordered: Current medicines are reviewed at length with the patient today.  Concerns regarding medicines are outlined above. Medication changes, Labs and Tests ordered today are summarized above and listed  in the Patient Instructions accessible in Encounters.   Signed, Varney Gentleman, PA-C 07/02/2023 11:46 AM     [facility] [address] [place], [place] [address] [phone]

## 2023-07-02 ENCOUNTER — Encounter: Payer: Self-pay | Admitting: Physician Assistant

## 2023-07-02 ENCOUNTER — Ambulatory Visit: Attending: Physician Assistant | Admitting: Physician Assistant

## 2023-07-02 VITALS — BP 151/78 | HR 53 | Ht 67.0 in | Wt 216.4 lb

## 2023-07-02 DIAGNOSIS — I7103 Dissection of thoracoabdominal aorta: Secondary | ICD-10-CM | POA: Diagnosis present

## 2023-07-02 DIAGNOSIS — I428 Other cardiomyopathies: Secondary | ICD-10-CM

## 2023-07-02 DIAGNOSIS — I493 Ventricular premature depolarization: Secondary | ICD-10-CM | POA: Diagnosis not present

## 2023-07-02 DIAGNOSIS — I10 Essential (primary) hypertension: Secondary | ICD-10-CM | POA: Diagnosis present

## 2023-07-02 DIAGNOSIS — Z789 Other specified health status: Secondary | ICD-10-CM | POA: Diagnosis present

## 2023-07-02 DIAGNOSIS — Z79899 Other long term (current) drug therapy: Secondary | ICD-10-CM | POA: Diagnosis present

## 2023-07-02 DIAGNOSIS — E785 Hyperlipidemia, unspecified: Secondary | ICD-10-CM | POA: Diagnosis present

## 2023-07-02 DIAGNOSIS — I340 Nonrheumatic mitral (valve) insufficiency: Secondary | ICD-10-CM

## 2023-07-02 DIAGNOSIS — I639 Cerebral infarction, unspecified: Secondary | ICD-10-CM | POA: Diagnosis present

## 2023-07-02 DIAGNOSIS — I5020 Unspecified systolic (congestive) heart failure: Secondary | ICD-10-CM

## 2023-07-02 DIAGNOSIS — I351 Nonrheumatic aortic (valve) insufficiency: Secondary | ICD-10-CM | POA: Diagnosis present

## 2023-07-02 DIAGNOSIS — I2510 Atherosclerotic heart disease of native coronary artery without angina pectoris: Secondary | ICD-10-CM

## 2023-07-02 DIAGNOSIS — I77810 Thoracic aortic ectasia: Secondary | ICD-10-CM | POA: Diagnosis present

## 2023-07-02 DIAGNOSIS — Z8679 Personal history of other diseases of the circulatory system: Secondary | ICD-10-CM

## 2023-07-02 MED ORDER — SPIRONOLACTONE 25 MG PO TABS: 25.0000 mg | ORAL_TABLET | Freq: Every day | ORAL | 3 refills | Status: AC

## 2023-07-02 NOTE — Patient Instructions (Signed)
 Medication Instructions:  Your physician recommends the following medication changes.  INCREASE: Spironolactone  to 25 mg by mouth daily    *If you need a refill on your cardiac medications before your next appointment, please call your pharmacy*  Lab Work: Your provider would like for you to return in 1 week to have the following labs drawn: (BMP).   Please go to [facility] [address] ([facility]) [address], [place] [address] You do not need an appointment.  They are open from 8 am- 4:30 pm.  Lunch from 1:00 pm- 2:00 pm You will not need to be fasting.   Testing/Procedures: Your physician has requested that you have an Limited echocardiogram in 6 weeks. Echocardiography is a painless test that uses sound waves to create images of your heart. It provides your doctor with information about the size and shape of your heart and how well your heart's chambers and valves are working.   You may receive an ultrasound enhancing agent through an IV if needed to better visualize your heart during the echo. This procedure takes approximately one hour.  There are no restrictions for this procedure.  This will take place at [address] [facility]) [address], [place] [address]  Please note: We ask at that you not bring children with you during ultrasound (echo/ vascular) testing. Due to room size and safety concerns, children are not allowed in the ultrasound rooms during exams. Our front office staff cannot provide observation of children in our lobby area while testing is being conducted. An adult accompanying a patient to their appointment will only be allowed in the ultrasound room at the discretion of the ultrasound technician under special circumstances. We apologize for any inconvenience.   Follow-Up: At [facility], you and your health needs are our priority.  As part of our continuing mission to provide you with exceptional heart care, our  providers are all part of one team.  This team includes your primary Cardiologist (physician) and Advanced Practice Providers or APPs (Physician Assistants and Nurse Practitioners) who all work together to provide you with the care you need, when you need it.  Your next appointment:   2 month(s)  Provider:   Varney Gentleman, PA-C

## 2023-09-02 ENCOUNTER — Ambulatory Visit: Attending: Physician Assistant

## 2023-09-02 ENCOUNTER — Ambulatory Visit: Payer: Self-pay | Admitting: Physician Assistant

## 2023-09-02 DIAGNOSIS — I5020 Unspecified systolic (congestive) heart failure: Secondary | ICD-10-CM | POA: Insufficient documentation

## 2023-09-02 LAB — ECHOCARDIOGRAM LIMITED
AV Mean grad: 3 mmHg
AV Peak grad: 6.5 mmHg
Ao pk vel: 1.27 m/s
Area-P 1/2: 2.29 cm2
Est EF: 50
S' Lateral: 4.48 cm

## 2023-09-03 NOTE — Progress Notes (Unsigned)
 Cardiology Office Note    Date:  09/04/2023   ID:  [name], [name] 04-14-1955, MRN [id number]  PCP:  Gabriel Arlyss RAMAN, MD  Cardiologist:  Aleene Passe, MD (Inactive)  Electrophysiologist:  None   Chief Complaint: Follow up  History of Present Illness:   Gabriel Tran is a 68 y.o. 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   He underwent initial emergent ascending thoracic aortic dissection repair on 10/30/2011, in the setting of uncontrolled hypertension.  Postop course was complicated by tamponade/hematoma evacuation on 11/02/2012 with PEA arrest and PE.  LHC showed no evidence of obstructive CAD.  Hospital course was further complicated by renal failure requiring dialysis.  He was admitted in 2015 with left brain stroke with right hemiparesis.  Initial blood pressure of 200/115.  He was treated with tPA.  MRI showed a left frontal middle cerebral artery branch infarct felt to be possibly embolic.  Surface echo showed an EF of 60 to 65%, no regional wall motion neurologist, and mild aortic insufficiency.  He was discharged on clopidogrel .  He subsequently underwent TEE that showed no evidence of emboli.  ILR was implanted that showed no evidence of arrhythmia.   He was seen in 06/2022 noting exertional dyspnea.  Echo from 07/2022 showed an EF of 45 to 50%, inferior hypokinesis, mild LVH, normal LV diastolic function parameters, mildly reduced RV systolic function mildly enlarged ventricular cavity size and normal RVSP, moderate biatrial enlargement, trivial mitral regurgitation, mild to moderate aortic insufficiency, dilated aortic root measuring 43  mm, and an estimated right atrial pressure of 3 mmHg.   CTA chest/aorta in 10/2022 showed stable appearance of previously visualized chronic type B thoracoabdominal aortic dissection as well as similar appearing postsurgical changes after ascending aortic tube graft repair without complicating features.  Slight interval dilatation of the aortic root with the sinuses of Valsalva measuring up to 4 cm (previously 3.8 cm), mild global cardiomegaly, and moderate three-vessel coronary artery calcification.   He was seen at his PCP's office on 03/11/2023 and reported progressive dyspnea and orthopnea with associated palpitations that began around 02/22/2023.  He was advised to take a dose of Lasix  20 mg.  Labs obtained at that time showed a BNP of 1377.  EKG obtained at that time demonstrated sinus rhythm with a known right bundle branch block with occasional PVCs.   He was evaluated in our office on 03/12/2023 noting shortness of breath that initially began in 01/2022 that was relatively stable up until the past several weeks.  In 02/2023, he developed significant shortness of breath with exertional fatigue without frank chest pain.  In this setting he had noted progressive orthopnea.  No significant lower extremity swelling.  Weight had been largely stable around 220 pounds throughout the above.  It was recommended he titrate furosemide  to 40 mg daily for 2 days followed by resumption of 20 mg daily thereafter.  Echo on 03/19/2023 showed an EF of 35 to 40%, global hypokinesis, mild LVH, moderately reduced RV systolic function with normal ventricular cavity size and RVSP, mildly dilated left atrium, mild to moderate aortic valve insufficiency with aortic valve sclerosis without evidence of stenosis, mild dilatation of  the aortic root measuring 43 mm, and an estimated right atrial pressure of 8 mmHg.  He was seen in the office on 03/29/2023 feeling like his breathing was back to baseline.  However, he did note some exertional  shortness of breath with overexertion.  Orthopnea was improved.  Weight remained stable at home around 220 pounds.  He was taking furosemide  20 mg daily with good urine output.  He had self discontinued rosuvastatin  due to concerns of cognitive decline.  Losartan  was stopped and he was started on Entresto  49/51 mg twice daily with continuation of Lopressor  50 mg twice daily with plans to consolidate this to Toprol -XL moving forward.  After discussion with interventional cardiology, given his history of thoracoabdominal aortic dissection status post repair, we pursued coronary CTA on 04/04/2023 that showed a calcium  score of 417 which was the 80th percentile.  There was 25% proximal LAD stenosis and less than 25% proximal RCA stenosis.  Aortic root dilated, measuring 51 mm.  Noncardiac overread showed stable dissection of the descending thoracic aorta with dilated aortic root measuring 51 mm.  He was referred to CT surgery for ongoing evaluation with continued close monitoring with overall stable dilated aortic root measuring 5.1 cm that was unchanged from study in 10/2022.  He was last seen in the office on 07/02/2023 and was doing well from a cardiac perspective with unchanged chronic dyspnea.  Lactone was titrated to 25 mg daily with continuation of Entresto , Toprol -XL, and furosemide .  Subsequent echo on 09/02/2023 showed an EF of 50%, mild LVH, grade 2 diastolic dysfunction, low normal RV systolic function and normal ventricular cavity size, trivial mitral regurgitation, mild aortic insufficiency, and aortic valve sclerosis without evidence of stenosis.  He comes in doing well from a cardiac perspective and remains without symptoms of angina or cardiac decompensation.  Dyspnea is improved.  He does note some lower extremity swelling in the right ankle in the setting of twisting his ankle on a ladder.  Weight stable.  No progressive orthopnea.  No falls.  He does note some intermittent bright red blood on the  toilet tissue that he has attributed to hemorrhoids.  He also notes development of abdominal bloating with associated diarrhea since initiating Praluent .   Labs independently reviewed: 05/2023 - BUN 16, serum creatinine 1.15, potassium 4.8 03/2023 - BNP 1377, TSH normal, albumin  4.2, AST/ALT normal, Hgb 15.4, PLT 157 06/2022 - TC 170, TG 106, HDL 67, LDL 92  Past Medical History:  Diagnosis Date   Aortic dissection (HCC)    Cardiac tamponade 2013   Colon polyps 2017   CVA (cerebral infarction)    HLD (hyperlipidemia)    Hyperglycemia    a. A1C 5.9 in Sept 2013.   Hypertension    PE (pulmonary embolism)    RBBB    Renal insufficiency 08/28/2012   Sleep apnea    history of    Past Surgical History:  Procedure Laterality Date   CARPAL TUNNEL RELEASE Right 2004   COLONOSCOPY     INSERTION OF DIALYSIS CATHETER  11/06/2011   Procedure: INSERTION OF DIALYSIS CATHETER;  Surgeon: Maude Fleeta Ochoa, MD;  Location: [facility] OR;  Service: Thoracic;  Laterality: Right;   INSERTION OF DIALYSIS CATHETER  11/13/2011   Procedure: INSERTION OF DIALYSIS CATHETER;  Surgeon: Carlin FORBES Haddock, MD;  Location: [facility] OR;  Service: Vascular;  Laterality: N/A;  Ultrasound guided.   LEFT HEART CATHETERIZATION WITH CORONARY ANGIOGRAM N/A 10/30/2011   Procedure: LEFT HEART CATHETERIZATION WITH CORONARY ANGIOGRAM;  Surgeon: Lonni JONETTA Cash, MD;  Location: [place] CATH LAB;  Service: Cardiovascular;  Laterality: N/A;   LIGAMENT REPAIR Left 1981   Knee   LOOP RECORDER IMPLANT N/A 01/20/2014   MDT LINQ implanted by Dr Waddell for cryptogenic stroke   MEDIASTINAL EXPLORATION  11/06/2011   Procedure: MEDIASTINAL EXPLORATION;  Surgeon: Maude Fleeta Ochoa, MD;  Location: [facility] OR;  Service: Thoracic;  Laterality: N/A;  sternal closure; removal of wound vac   POLYPECTOMY     TEE WITHOUT CARDIOVERSION N/A 01/20/2014   Procedure: TRANSESOPHAGEAL ECHOCARDIOGRAM (TEE);  Surgeon: Oneil Parchment, MD;  Location: [facility] ENDOSCOPY;  Service:  Cardiovascular;  Laterality: N/A;   THORACIC AORTIC ANEURYSM REPAIR  10/30/2011   Procedure: THORACIC ASCENDING ANEURYSM REPAIR (AAA);  Surgeon: Maude Fleeta Ochoa, MD;  Location: [facility] OR;  Service: Open Heart Surgery;  Laterality: N/A;  Ascending aortic dissection repair, arch reconstruction, aortic valve repair   WISDOM TOOTH EXTRACTION      Current Medications: Current Meds  Medication Sig   Alirocumab  (PRALUENT ) 75 MG/ML SOAJ Inject 1 mL (75 mg total) into the skin every 14 (fourteen) days.   ALPRAZolam  (XANAX ) 0.5 MG tablet TAKE 1 TABLET(0.5 MG) BY MOUTH TWICE DAILY AS NEEDED FOR ANXIETY   aspirin  81 MG chewable tablet Chew 81 mg by mouth daily.   Cholecalciferol (VITAMIN D3) 50 MCG (2000 UT) TABS Take by mouth. daily   clopidogrel  (PLAVIX ) 75 MG tablet TAKE ONE TABLET BY MOUTH ONE TIME DAILY   cyclobenzaprine  (FLEXERIL ) 10 MG tablet TAKE 1/2 TO 1 TABLET BY MOUTH THREE TIMES DAILY AS NEEDED FOR MUSCLE SPASMS,( SEDATION CAUTION)   furosemide  (LASIX ) 20 MG tablet Take 1 tablet (20 mg total) by mouth every other day.   methocarbamol (ROBAXIN) 500 MG tablet Take 1 tablet by mouth daily as needed for spasms.   metoprolol  succinate (TOPROL -XL) 100 MG 24 hr tablet Take 1 tablet (100 mg total) by mouth daily. Take with or immediately following a meal.   Multiple Vitamin (MULTIVITAMIN WITH MINERALS) TABS tablet Take 1 tablet by mouth 2 (two) times daily.    rosuvastatin  (CRESTOR ) 10 MG tablet Take 1 tablet (10 mg total) by mouth daily.   sacubitril -valsartan  (ENTRESTO ) 97-103 MG Take 1 tablet by mouth 2 (two) times daily.   spironolactone  (ALDACTONE ) 25 MG tablet Take 1 tablet (25 mg total) by mouth daily.   traMADol  (ULTRAM ) 50 MG tablet Take 1 tablet by mouth daily as needed for pain. MAX OF 4 TABLETS PER DAY    Allergies:   Carvedilol  and Lipitor [atorvastatin]   Social History   Socioeconomic History   Marital status: Married    Spouse name: Not on file   Number of children: 0   Years of  education: Not on file   Highest education level: Not on file  Occupational History   Occupation: retired  Tobacco Use   Smoking status: Former    Current packs/day: 0.00    Average packs/day: 1 pack/day for 25.0 years (25.0 ttl pk-yrs)    Types: Cigarettes    Start date: 02/05/1965    Quit date: 02/05/1990    Years since quitting: 33.6   Smokeless tobacco: Former    Types: Snuff    Quit date: 01/05/2012   Tobacco comments:    Smoked for 20 years.  Vaping Use   Vaping status: Never Used  Substance and Sexual Activity   Alcohol use: Yes    Alcohol/week: 6.0 standard drinks of alcohol    Types: 6 Cans  of beer per week    Comment: 6 beers a week on average   Drug use: No   Sexual activity: Yes    Birth control/protection: Surgical    Comment: Vasectomy  Other Topics Concern   Not on file  Social History Narrative   Prev worked at [employer] with frequent long travel.     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     Married 2000, 2 adult step kids, 3 grandkids.     Social Drivers of Corporate investment banker Strain: Not on file  Food Insecurity: Not on file  Transportation Needs: Not on file  Physical Activity: Not on file  Stress: Not on file  Social Connections: Unknown (06/20/2021)   Received from [facility]   Social Network    Social Network: Not on file     Family History:  The patient's family history includes Alzheimer's disease in his paternal grandmother; Colon polyps in his brother; Heart attack in his maternal grandfather and paternal grandfather; Heart disease in his brother and father; Hyperlipidemia in his brother, brother, father, and mother; Hypertension in his brother, brother, and father; Kidney disease in his brother; Leukemia in his father; Parkinsonism in his mother; Stroke in his mother. There is no history of Prostate cancer, Colon cancer, Rectal cancer, or Stomach cancer.  ROS:   12-point review of systems is negative unless otherwise noted in  the HPI.   EKGs/Labs/Other Studies Reviewed:    Studies reviewed were summarized above. The additional studies were reviewed today:  2D echo 07/24/2022: 1. Left ventricular ejection fraction, by estimation, is 45 to 50%. The  left ventricle has mildly decreased function. The left ventricle  demonstrates regional wall motion abnormalities, Inferior hypokinesis.  There is mild left ventricular hypertrophy.  Left ventricular diastolic parameters were normal.   2. Right ventricular systolic function is mildly reduced. The right  ventricular size is mildly enlarged. There is normal pulmonary artery  systolic pressure. The estimated right ventricular systolic pressure is  33.5 mmHg.   3. Left atrial size was moderately dilated.   4. Right atrial size was moderately dilated.   5. The mitral valve is normal in structure. Trivial mitral valve  regurgitation.   6. The aortic valve is tricuspid. Aortic valve regurgitation is mild to  moderate. No aortic stenosis is present.   7. Aortic dilatation noted. There is dilatation of the aortic root,  measuring 43 mm.   8. The inferior vena cava is normal in size with greater than 50%  respiratory variability, suggesting right atrial pressure of 3 mmHg.  __________   2D echo 03/19/2023: 1. Left ventricular ejection fraction, by estimation, is 35 to 40%. Left  ventricular ejection fraction by 2D MOD biplane is 38.4 %. The left  ventricle has moderately decreased function. The left ventricle  demonstrates global hypokinesis. There is mild  left ventricular hypertrophy. Left ventricular diastolic parameters are  indeterminate.   2. Right ventricular systolic function is moderately reduced. The right  ventricular size is normal. There is normal pulmonary artery systolic  pressure. The estimated right ventricular systolic pressure is 21.0 mmHg.   3. Left atrial size was mildly dilated.   4. The mitral valve is normal in structure. No evidence of mitral  valve  regurgitation. No evidence of mitral stenosis.   5. The aortic valve has an indeterminant number of cusps. There is mild  calcification of the aortic valve. Aortic valve regurgitation is mild to  moderate. Aortic  valve sclerosis is present, with no evidence of aortic  valve stenosis.   6. There is mild dilatation of the aortic root, measuring 43 mm.   7. The inferior vena cava is dilated in size with >50% respiratory  variability, suggesting right atrial pressure of 8 mmHg.   Comparison(s): Previous LVEF reported as 45-50%.  __________   Coronary CTA 04/04/2023: FINDINGS: Aorta: S/p ascending aorta repair. Dilated aortic root, 51 mm in maximum diameter. Aortic wall calcifications. No dissection.   Aortic Valve:  Trileaflet. mild calcifications.   Coronary Arteries:  Normal coronary origin.  Right dominance.   RCA is a dominant artery. There is calcified plaque proximally causing minimal stenosis (<25%).   Left main gives rise to LAD and LCX arteries. LM has no disease.   LAD has calcified plaque proximally causing mild stenosis (25%).   LCX is a non-dominant artery.  There is no plaque.   Other findings:   Normal pulmonary vein drainage into the left atrium.   Normal left atrial appendage without a thrombus.   Normal size of the pulmonary artery.   IMPRESSION: 1. Coronary calcium  score of 417. This was 80th percentile for age and sex matched control. 2. Normal coronary origin with right dominance. 3. Mild proximal LAD stenosis (25%). 4. Minimal proximal RCA stenosis (<25%). 5. CAD-RADS 2. Mild non-obstructive CAD (25-49%). Consider non-atherosclerotic causes of chest pain. Consider preventive therapy and risk factor modification. 6. S/p ascending aorta repair. Dilated aortic root, 51 mm in maximum diameter.     Noncardiac overread: IMPRESSION: *No acute cardiopulmonary process. *Stable dissection of the descending thoracic aorta. *Stable prominence of the  interstitial markings with chronic left lower lobe hypoventilatory atelectatic changes without evidence of acute infiltrates or consolidations. __________  Limited echo 09/02/2023: 1. Left ventricular ejection fraction, by estimation, is 50%. Left  ventricular ejection fraction by PLAX is 38 %. The left ventricle has low  normal function. There is mild left ventricular hypertrophy. Left  ventricular diastolic parameters are  consistent with Grade II diastolic dysfunction (pseudonormalization). The  average left ventricular global longitudinal strain is -14.4 %. The global  longitudinal strain is abnormal.   2. Right ventricular systolic function is low normal. The right  ventricular size is normal.   3. The mitral valve is normal in structure. Trivial mitral valve  regurgitation.   4. Aortic valve regurgitation is mild. Aortic valve  sclerosis/calcification is present, without any evidence of aortic  stenosis.   5. The inferior vena cava is normal in size with greater than 50%  respiratory variability, suggesting right atrial pressure of 3 mmHg.    EKG:  EKG is not ordered today.    Recent Labs: 03/11/2023: ALT 41; Hemoglobin 15.4; Platelets 157.0; Pro B Natriuretic peptide (BNP) 1,377.0; TSH 1.71 06/03/2023: BUN 16; Creatinine, Ser 1.15; Potassium 4.8; Sodium 139  Recent Lipid Panel    Component Value Date/Time   CHOL 178 06/21/2022 1620   TRIG 106 06/21/2022 1620   HDL 67 06/21/2022 1620   CHOLHDL 2.7 06/21/2022 1620   CHOLHDL 3 08/31/2019 0847   VLDL 22.6 08/31/2019 0847   LDLCALC 92 06/21/2022 1620   LDLDIRECT 142.8 08/31/2011 0835    PHYSICAL EXAM:    VS:  BP (!) 146/76   Pulse (!) 52   Ht 5' 6.25 (1.683 m)   Wt 214 lb 3.2 oz (97.2 kg)   SpO2 98%   BMI 34.31 kg/m   BMI: Body mass index is 34.31 kg/m.  Physical Exam Vitals  reviewed.  Constitutional:      Appearance: He is well-developed.  HENT:     Head: Normocephalic and atraumatic.  Eyes:     General:         Right eye: No discharge.        Left eye: No discharge.  Cardiovascular:     Rate and Rhythm: Normal rate and regular rhythm.     Heart sounds: S1 normal and S2 normal. Heart sounds not distant. No midsystolic click and no opening snap. Murmur heard.     Systolic murmur is present with a grade of 1/6 at the upper right sternal border.     No friction rub.  Pulmonary:     Effort: Pulmonary effort is normal. No respiratory distress.     Breath sounds: Normal breath sounds. No decreased breath sounds, wheezing, rhonchi or rales.  Musculoskeletal:     Cervical back: Normal range of motion.  Skin:    General: Skin is warm and dry.     Nails: There is no clubbing.  Neurological:     Mental Status: He is alert and oriented to person, place, and time.  Psychiatric:        Speech: Speech normal.        Behavior: Behavior normal.        Thought Content: Thought content normal.        Judgment: Judgment normal.     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  Continue current GDMT including Entresto  97/23 mg twice daily, spironolactone  25 mg, Toprol -XL 100 mg, and furosemide  20 mg every other day.  Check renal function and electrolytes.  Given improvement in LV systolic function and in the setting of lack of heart failure symptoms, in an effort to minimize pharmacotherapy and financial burden, defer addition of SGLT2 inhibitor at this time.  CAD involving native coronary arteries without angina: No symptoms suggestive of angina. Recent coronary CTA showed mild nonobstructive CAD as outlined above. Continue aggressive risk factor modification and primary prevention including aspirin  81 mg.  Anticipate transitioning from PCSK9 inhibitor to bempedoic acid as below.   PVCs: Quiescent.   Remains on metoprolol  as above.  Aortic insufficiency/mitral regurgitation: Trivial mitral regurgitation and mild aortic insufficiency on echo earlier this month.  Anticipate follow-up echo in 03/2024.  Dilated aortic root and ascending aorta with history of thoracoabdominal aortic dissection: He has a history of chronic type B thoracoabdominal aortic dissection with ascending thoracic aortic dissection status post tube graft repair with postoperative course complicated by tamponade with hematoma and PEA arrest as well as PE and acute renal failure requiring hemodialysis.  Hemodynamically stable.  Followed by cardiothoracic surgery.  Stable by CT in 03/2023 when compared to imaging from 10/2022.  Follow-up with CT surgery as directed.  HTN: Blood pressure is mildly elevated in the office today, though typically well-controlled.  Continue Entresto  97/103 mg twice daily and spironolactone  25 mg daily.  Check renal function and electrolytes.  Low-sodium diet is recommended.  HLD with statin intolerance: LDL 92 in 06/2022.  Target LDL less than 70.  More recently has been maintained on Praluent , though notes GI off target effect with this with abdominal bloating and diarrhea that is affecting his quality of  life.  We will check lipid panel and LFT today.  Anticipate transitioning to bempedoic acid based on results.  Cryptogenic CVA: No new deficits. TEE and ILR unrevealing. Remains on clopidogrel .      Disposition: F/u with me in 6 months.   Medication Adjustments/Labs and Tests Ordered: Current medicines are reviewed at length with the patient today.  Concerns regarding medicines are outlined above. Medication changes, Labs and Tests ordered today are summarized above and listed in the Patient Instructions accessible in Encounters.   Signed, Bernardino Bring, PA-C 09/04/2023 4:52 PM     [facility] [place] [address] [place], [place] [address] [phone]

## 2023-09-04 ENCOUNTER — Encounter: Payer: Self-pay | Admitting: Physician Assistant

## 2023-09-04 ENCOUNTER — Ambulatory Visit: Attending: Physician Assistant | Admitting: Physician Assistant

## 2023-09-04 VITALS — BP 146/76 | HR 52 | Ht 66.25 in | Wt 214.2 lb

## 2023-09-04 DIAGNOSIS — I639 Cerebral infarction, unspecified: Secondary | ICD-10-CM | POA: Diagnosis present

## 2023-09-04 DIAGNOSIS — E785 Hyperlipidemia, unspecified: Secondary | ICD-10-CM | POA: Diagnosis present

## 2023-09-04 DIAGNOSIS — I7103 Dissection of thoracoabdominal aorta: Secondary | ICD-10-CM | POA: Diagnosis present

## 2023-09-04 DIAGNOSIS — I2510 Atherosclerotic heart disease of native coronary artery without angina pectoris: Secondary | ICD-10-CM | POA: Diagnosis present

## 2023-09-04 DIAGNOSIS — I428 Other cardiomyopathies: Secondary | ICD-10-CM | POA: Diagnosis present

## 2023-09-04 DIAGNOSIS — I351 Nonrheumatic aortic (valve) insufficiency: Secondary | ICD-10-CM | POA: Insufficient documentation

## 2023-09-04 DIAGNOSIS — I10 Essential (primary) hypertension: Secondary | ICD-10-CM | POA: Diagnosis present

## 2023-09-04 DIAGNOSIS — I5032 Chronic diastolic (congestive) heart failure: Secondary | ICD-10-CM | POA: Diagnosis present

## 2023-09-04 DIAGNOSIS — I77810 Thoracic aortic ectasia: Secondary | ICD-10-CM | POA: Diagnosis present

## 2023-09-04 DIAGNOSIS — I340 Nonrheumatic mitral (valve) insufficiency: Secondary | ICD-10-CM | POA: Insufficient documentation

## 2023-09-04 DIAGNOSIS — Z79899 Other long term (current) drug therapy: Secondary | ICD-10-CM | POA: Diagnosis present

## 2023-09-04 DIAGNOSIS — I493 Ventricular premature depolarization: Secondary | ICD-10-CM | POA: Diagnosis present

## 2023-09-04 DIAGNOSIS — Z789 Other specified health status: Secondary | ICD-10-CM | POA: Insufficient documentation

## 2023-09-04 NOTE — Patient Instructions (Signed)
 Medication Instructions:  Your physician recommends that you continue on your current medications as directed. Please refer to the Current Medication list given to you today.   *If you need a refill on your cardiac medications before your next appointment, please call your pharmacy*  Lab Work: Your provider would like for you to have following labs drawn today Lipid panel and CMeT.   If you have labs (blood work) drawn today and your tests are completely normal, you will receive your results only by: MyChart Message (if you have MyChart) OR A paper copy in the mail If you have any lab test that is abnormal or we need to change your treatment, we will call you to review the results.  Testing/Procedures: None ordered at this time   Follow-Up: At [facility], you and your health needs are our priority.  As part of our continuing mission to provide you with exceptional heart care, our providers are all part of one team.  This team includes your primary Cardiologist (physician) and Advanced Practice Providers or APPs (Physician Assistants and Nurse Practitioners) who all work together to provide you with the care you need, when you need it.  Your next appointment:   6 month(s)  Provider:   You may see Aleene Passe, MD (Inactive) or Bernardino Bring, PA-C

## 2023-09-07 LAB — LIPID PANEL
Chol/HDL Ratio: 2.8 ratio (ref 0.0–5.0)
Cholesterol, Total: 193 mg/dL (ref 100–199)
HDL: 69 mg/dL (ref >39)
LDL Chol Calc (NIH): 106 mg/dL — ABNORMAL HIGH (ref 0–99)
Triglycerides: 101 mg/dL (ref 0–149)
VLDL Cholesterol Cal: 18 mg/dL (ref 5–40)

## 2023-09-07 LAB — COMPREHENSIVE METABOLIC PANEL WITH GFR
ALT: 14 IU/L (ref 0–44)
AST: 20 IU/L (ref 0–40)
Albumin: 4 g/dL (ref 3.9–4.9)
Alkaline Phosphatase: 51 IU/L (ref 44–121)
BUN/Creatinine Ratio: 15 (ref 10–24)
BUN: 19 mg/dL (ref 8–27)
Bilirubin Total: 0.4 mg/dL (ref 0.0–1.2)
CO2: 19 mmol/L — ABNORMAL LOW (ref 20–29)
Calcium: 9.8 mg/dL (ref 8.6–10.2)
Chloride: 101 mmol/L (ref 96–106)
Creatinine, Ser: 1.23 mg/dL (ref 0.76–1.27)
Globulin, Total: 2.6 g/dL (ref 1.5–4.5)
Glucose: 108 mg/dL — ABNORMAL HIGH (ref 70–99)
Potassium: 4.8 mmol/L (ref 3.5–5.2)
Sodium: 137 mmol/L (ref 134–144)
Total Protein: 6.6 g/dL (ref 6.0–8.5)
eGFR: 65 mL/min/1.73 (ref >59)

## 2023-09-08 ENCOUNTER — Ambulatory Visit: Payer: Self-pay | Admitting: Nurse Practitioner

## 2023-09-18 ENCOUNTER — Other Ambulatory Visit: Payer: Self-pay | Admitting: Emergency Medicine

## 2023-09-18 MED ORDER — ROSUVASTATIN CALCIUM 10 MG PO TABS: 10.0000 mg | ORAL_TABLET | Freq: Every day | ORAL | 3 refills | Status: AC

## 2023-10-09 ENCOUNTER — Ambulatory Visit (HOSPITAL_COMMUNITY)
Admission: RE | Admit: 2023-10-09 | Discharge: 2023-10-09 | Disposition: A | Discharge status: Home / Self Care | Source: Ambulatory Visit | Attending: Cardiovascular Disease | Admitting: Cardiovascular Disease

## 2023-10-09 DIAGNOSIS — I77810 Thoracic aortic ectasia: Secondary | ICD-10-CM | POA: Insufficient documentation

## 2023-10-09 MED ORDER — IOHEXOL 350 MG/ML SOLN
75.0000 mL | Freq: Once | INTRAVENOUS | Status: AC | PRN
  Administered 2023-10-09: 75 mL via INTRAVENOUS

## 2023-10-16 ENCOUNTER — Ambulatory Visit: Payer: Self-pay | Admitting: Cardiovascular Disease

## 2023-12-03 ENCOUNTER — Ambulatory Visit (INDEPENDENT_AMBULATORY_CARE_PROVIDER_SITE_OTHER)

## 2023-12-03 VITALS — BP 136/82 | Ht 66.25 in | Wt 214.0 lb

## 2023-12-03 DIAGNOSIS — Z0000 Encounter for general adult medical examination without abnormal findings: Secondary | ICD-10-CM | POA: Diagnosis not present

## 2023-12-03 NOTE — Progress Notes (Signed)
 Subjective:   Gabriel Tran is a 68 y.o. who presents for a Medicare Wellness preventive visit.  As a reminder, Annual Wellness Visits don't include a physical exam, and some assessments may be limited, especially if this visit is performed virtually. We may recommend an in-person follow-up visit with your provider if needed.  Visit Complete: In person  Persons Participating in Visit: Patient.  AWV Questionnaire: No: Patient Medicare AWV questionnaire was not completed prior to this visit.  Cardiac Risk Factors include: advanced age (>57men, >32 women);dyslipidemia;hypertension;male gender;obesity (BMI >30kg/m2);sedentary lifestyle     Objective:    Today's Vitals   12/03/23 1038  Weight: 214 lb (97.1 kg)  Height: 5' 6.25 (1.683 m)   Body mass index is 34.28 kg/m.     12/03/2023   10:57 AM 01/20/2014    2:08 PM 01/14/2014    5:00 AM 11/16/2011    9:00 PM 10/29/2011    9:15 PM  Advanced Directives  Does Patient Have a Medical Advance Directive? Yes No  No  Patient does not have advance directive  Patient does not have advance directive;Patient would like information   Type of Advance Directive Healthcare Power of [place];Living will      Copy of Healthcare Power of Attorney in Chart? No - copy requested      Would patient like information on creating a medical advance directive?     Advance directive packet given   Pre-existing out of facility DNR order (yellow form or pink MOST form)     No      Data saved with a previous flowsheet row definition    Current Medications (verified) Outpatient Encounter Medications as of 12/03/2023  Medication Sig   ALPRAZolam  (XANAX ) 0.5 MG tablet TAKE 1 TABLET(0.5 MG) BY MOUTH TWICE DAILY AS NEEDED FOR ANXIETY (Patient not taking: Reported on 12/03/2023)   aspirin  81 MG chewable tablet Chew 81 mg by mouth daily.   Cholecalciferol (VITAMIN D3) 50 MCG (2000 UT) TABS Take by mouth. daily   clopidogrel  (PLAVIX ) 75 MG tablet TAKE  ONE TABLET BY MOUTH ONE TIME DAILY   cyclobenzaprine  (FLEXERIL ) 10 MG tablet TAKE 1/2 TO 1 TABLET BY MOUTH THREE TIMES DAILY AS NEEDED FOR MUSCLE SPASMS,( SEDATION CAUTION) (Patient not taking: Reported on 12/03/2023)   furosemide  (LASIX ) 20 MG tablet Take 1 tablet (20 mg total) by mouth every other day.   methocarbamol (ROBAXIN) 500 MG tablet Take 1 tablet by mouth daily as needed for spasms. (Patient not taking: Reported on 12/03/2023)   metoprolol  succinate (TOPROL -XL) 100 MG 24 hr tablet Take 1 tablet (100 mg total) by mouth daily. Take with or immediately following a meal.   Multiple Vitamin (MULTIVITAMIN WITH MINERALS) TABS tablet Take 1 tablet by mouth 2 (two) times daily.    rosuvastatin  (CRESTOR ) 10 MG tablet Take 1 tablet (10 mg total) by mouth daily.   sacubitril -valsartan  (ENTRESTO ) 97-103 MG Take 1 tablet by mouth 2 (two) times daily.   spironolactone  (ALDACTONE ) 25 MG tablet Take 1 tablet (25 mg total) by mouth daily.   traMADol  (ULTRAM ) 50 MG tablet Take 1 tablet by mouth daily as needed for pain. MAX OF 4 TABLETS PER DAY (Patient not taking: Reported on 12/03/2023)   No facility-administered encounter medications on file as of 12/03/2023.    Allergies (verified) Carvedilol  and Lipitor [atorvastatin]   History: Past Medical History:  Diagnosis Date   Aortic dissection (HCC)    Cardiac tamponade 2013   Colon polyps 2017  CVA (cerebral infarction)    HLD (hyperlipidemia)    Hyperglycemia    a. A1C 5.9 in Sept 2013.   Hypertension    PE (pulmonary embolism)    RBBB    Renal insufficiency 08/28/2012   Sleep apnea    history of   Past Surgical History:  Procedure Laterality Date   CARPAL TUNNEL RELEASE Right 2004   COLONOSCOPY     INSERTION OF DIALYSIS CATHETER  11/06/2011   Procedure: INSERTION OF DIALYSIS CATHETER;  Surgeon: Maude Fleeta Ochoa, MD;  Location: [facility] OR;  Service: Thoracic;  Laterality: Right;   INSERTION OF DIALYSIS CATHETER  11/13/2011   Procedure:  INSERTION OF DIALYSIS CATHETER;  Surgeon: Carlin FORBES Haddock, MD;  Location: [facility] OR;  Service: Vascular;  Laterality: N/A;  Ultrasound guided.   LEFT HEART CATHETERIZATION WITH CORONARY ANGIOGRAM N/A 10/30/2011   Procedure: LEFT HEART CATHETERIZATION WITH CORONARY ANGIOGRAM;  Surgeon: Lonni JONETTA Cash, MD;  Location: [facility] CATH LAB;  Service: Cardiovascular;  Laterality: N/A;   LIGAMENT REPAIR Left 1981   Knee   LOOP RECORDER IMPLANT N/A 01/20/2014   MDT LINQ implanted by Dr Waddell for cryptogenic stroke   MEDIASTINAL EXPLORATION  11/06/2011   Procedure: MEDIASTINAL EXPLORATION;  Surgeon: Maude Fleeta Ochoa, MD;  Location: [facility] OR;  Service: Thoracic;  Laterality: N/A;  sternal closure; removal of wound vac   POLYPECTOMY     TEE WITHOUT CARDIOVERSION N/A 01/20/2014   Procedure: TRANSESOPHAGEAL ECHOCARDIOGRAM (TEE);  Surgeon: Oneil Parchment, MD;  Location: [facility] ENDOSCOPY;  Service: Cardiovascular;  Laterality: N/A;   THORACIC AORTIC ANEURYSM REPAIR  10/30/2011   Procedure: THORACIC ASCENDING ANEURYSM REPAIR (AAA);  Surgeon: Maude Fleeta Ochoa, MD;  Location: [facility] OR;  Service: Open Heart Surgery;  Laterality: N/A;  Ascending aortic dissection repair, arch reconstruction, aortic valve repair   WISDOM TOOTH EXTRACTION     Family History  Problem Relation Age of Onset   Parkinsonism Mother    Hyperlipidemia Mother    Stroke Mother    Leukemia Father        AML at 29   Heart disease Father        Angioplasty in his 32s, CABG age 65.   Hypertension Father    Hyperlipidemia Father    Colon polyps Brother    Kidney disease Brother    Heart disease Brother        CABG at age 6.   Hyperlipidemia Brother    Hypertension Brother    Hyperlipidemia Brother    Hypertension Brother    Heart attack Maternal Grandfather    Alzheimer's disease Paternal Grandmother    Heart attack Paternal Grandfather    Prostate cancer Neg Hx    Colon cancer Neg Hx    Rectal cancer Neg Hx    Stomach cancer Neg Hx    Social  History   Socioeconomic History   Marital status: Married    Spouse name: Not on file   Number of children: 0   Years of education: Not on file   Highest education level: Not on file  Occupational History   Occupation: retired  Tobacco Use   Smoking status: Former    Current packs/day: 0.00    Average packs/day: 1 pack/day for 25.0 years (25.0 ttl pk-yrs)    Types: Cigarettes    Start date: 02/05/1965    Quit date: 02/05/1990    Years since quitting: 33.8   Smokeless tobacco: Former    Types: Snuff    Quit date: 01/05/2012  Tobacco comments:    Smoked for 20 years.  Vaping Use   Vaping status: Never Used  Substance and Sexual Activity   Alcohol use: Yes    Alcohol/week: 6.0 standard drinks of alcohol    Types: 6 Cans of beer per week    Comment: 6 beers a week on average   Drug use: No   Sexual activity: Yes    Birth control/protection: Surgical    Comment: Vasectomy  Other Topics Concern   Not on file  Social History Narrative   Prev worked at [employer] with frequent long travel.     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     Married 2000, 2 adult step kids, 3 grandkids.     Social Drivers of Corporate Investment Banker Strain: Low Risk  (12/03/2023)   Overall Financial Resource Strain (CARDIA)    Difficulty of Paying Living Expenses: Not hard at all  Food Insecurity: No Food Insecurity (12/03/2023)   Hunger Vital Sign    Worried About Running Out of Food in the Last Year: Never true    Ran Out of Food in the Last Year: Never true  Transportation Needs: No Transportation Needs (12/03/2023)   PRAPARE - Administrator, Civil Service (Medical): No    Lack of Transportation (Non-Medical): No  Physical Activity: Inactive (12/03/2023)   Exercise Vital Sign    Days of Exercise per Week: 0 days    Minutes of Exercise per Session: 0 min  Stress: No Stress Concern Present (12/03/2023)   [employer] of Occupational Health - Occupational Stress  Questionnaire    Feeling of Stress: Not at all  Social Connections: Moderately Integrated (12/03/2023)   Social Connection and Isolation Panel    Frequency of Communication with Friends and Family: Three times a week    Frequency of Social Gatherings with Friends and Family: Twice a week    Attends Religious Services: More than 4 times per year    Active Member of [employer] or Organizations: No    Attends Banker Meetings: Never    Marital Status: Married    Tobacco Counseling Counseling given: Not Answered Tobacco comments: Smoked for 20 years.    Clinical Intake:  Pre-visit preparation completed: Yes  Pain : No/denies pain    BMI - recorded: 34.28 Nutritional Status: BMI > 30  Obese Nutritional Risks: None Diabetes: No  Lab Results  Component Value Date   HGBA1C 5.4 11/17/2015   HGBA1C 5.6 09/23/2014   HGBA1C 5.7 (H) 01/14/2014     How often do you need to have someone help you when you read instructions, pamphlets, or other written materials from your doctor or pharmacy?: 1 - Never  Interpreter Needed?: No  Comments: lives with wife Information entered by :: B.Rolanda Campa,LPN   Activities of Daily Living     12/03/2023   10:57 AM  In your present state of health, do you have any difficulty performing the following activities:  Hearing? 0  Vision? 0  Difficulty concentrating or making decisions? 0  Walking or climbing stairs? 0  Dressing or bathing? 0  Doing errands, shopping? 0  Preparing Food and eating ? N  Using the Toilet? N  In the past six months, have you accidently leaked urine? N  Do you have problems with loss of bowel control? N  Managing your Medications? N  Managing your Finances? N  Housekeeping or managing your Housekeeping? N    Patient Care Team:  Cleatus Arlyss RAMAN, MD as PCP - General (Family Medicine) Nahser, Aleene PARAS, MD (Inactive) as PCP - Cardiology (Cardiology) Pegge Toribio PARAS, PA-C as Referring Physician  I have  updated your Care Teams any recent Medical Services you may have received from other providers in the past year.     Assessment:   This is a routine wellness examination for Gabriel Tran.  Hearing/Vision screen Hearing Screening - Comments:: Patient denies any hearing difficulties.   Vision Screening - Comments:: Pt says their vision is good with glasses [facility]   Goals Addressed             This Visit's Progress    Patient Stated       I would like to lose some wieht about 20lbs: eat less, healthy choices, exercise        Depression Screen     12/03/2023   10:53 AM 03/11/2023   10:03 AM 04/16/2022   11:12 AM 02/07/2017    4:24 PM 01/05/2016   10:55 AM  PHQ 2/9 Scores  PHQ - 2 Score 0 0 0 0 0  PHQ- 9 Score  3 0      Fall Risk     12/03/2023   10:49 AM 03/11/2023   10:03 AM 04/16/2022   11:01 AM 01/05/2016   10:55 AM  Fall Risk   Falls in the past year? 1 0 0 No   Comment fell in camper:no injuries     Number falls in past yr: 0 0 0   Injury with Fall? 0 0 0   Risk for fall due to : No Fall Risks No Fall Risks No Fall Risks   Follow up Education provided;Falls prevention discussed Falls evaluation completed Falls evaluation completed      Data saved with a previous flowsheet row definition    MEDICARE RISK AT HOME:  Medicare Risk at Home Any stairs in or around the home?: Yes If so, are there any without handrails?: Yes Home free of loose throw rugs in walkways, pet beds, electrical cords, etc?: Yes Adequate lighting in your home to reduce risk of falls?: Yes Life alert?: No Use of a cane, walker or w/c?: No Grab bars in the bathroom?: Yes Shower chair or bench in shower?: No Elevated toilet seat or a handicapped toilet?: No  TIMED UP AND GO:  Was the test performed?  Yes  Length of time to ambulate 10 feet: 10 sec Gait steady and fast without use of assistive device  Cognitive Function: 6CIT completed        12/03/2023   10:58 AM  6CIT  Screen  What Year? 0 points  What month? 0 points  What time? 0 points  Count back from 20 0 points  Months in reverse 0 points  Repeat phrase 0 points  Total Score 0 points    Immunizations Immunization History  Administered Date(s) Administered   Influenza Inj Mdck Quad Pf 02/10/2018   Influenza Split 12/06/2010   Influenza Whole 12/16/2009   Influenza,inj,Quad PF,6+ Mos 10/07/2014, 11/22/2015, 02/02/2017, 01/04/2022   Influenza-Unspecified 11/26/2012, 11/20/2013, 02/02/2017, 12/20/2021   Janssen (J&J) SARS-COV-2 Vaccination 05/13/2019, 01/06/2020   Pneumococcal Polysaccharide-23 11/22/2015, 02/02/2017   Td 02/09/2005   Tdap 10/01/2014   Zoster Recombinant(Shingrix) 02/02/2017, 11/02/2017, 11/03/2017    Screening Tests Health Maintenance  Topic Date Due   Influenza Vaccine  09/06/2023   COVID-19 Vaccine (3 - 2025-26 season) 10/07/2023   Pneumococcal Vaccine: 50+ Years (2 of 2 - PCV) 03/10/2024 (  Originally 02/02/2018)   DTaP/Tdap/Td (3 - Td or Tdap) 09/30/2024   Medicare Annual Wellness (AWV)  12/02/2024   Colonoscopy  12/12/2029   Hepatitis C Screening  Completed   Zoster Vaccines- Shingrix  Completed   Meningococcal B Vaccine  Aged Out    Health Maintenance Items Addressed: Pt declines the Covid, Influenza, and PCV vaccines  Additional Screening:  Vision Screening: Recommended annual ophthalmology exams for early detection of glaucoma and other disorders of the eye. Is the patient up to date with their annual eye exam?  No  Who is the provider or what is the name of the office in which the patient attends annual eye exams? [facility]-will make appt soon  Dental Screening: Recommended annual dental exams for proper oral hygiene  Community Resource Referral / Chronic Care Management: CRR required this visit?  No   CCM required this visit?  Appt scheduled with PCP   Plan:    I have personally reviewed and noted the following in the patient's chart:    Medical and social history Use of alcohol, tobacco or illicit drugs  Current medications and supplements including opioid prescriptions. Patient is not currently taking opioid prescriptions. Functional ability and status Nutritional status Physical activity Advanced directives List of other physicians Hospitalizations, surgeries, and ER visits in previous 12 months Vitals Screenings to include cognitive, depression, and falls Referrals and appointments  In addition, I have reviewed and discussed with patient certain preventive protocols, quality metrics, and best practice recommendations. A written personalized care plan for preventive services as well as general preventive health recommendations were provided to patient.   Gabriel LITTIE Saris, LPN   [id number]   After Visit Summary: (In Person-Declined) Patient declined AVS at this time.  Notes: Nothing significant to report at this time.

## 2023-12-03 NOTE — Patient Instructions (Signed)
 [name],  Thank you for taking the time for your Medicare Wellness Visit. I appreciate your continued commitment to your health goals. Please review the care plan we discussed, and feel free to reach out if I can assist you further.  Medicare recommends these wellness visits once per year to help you and your care team stay ahead of potential health issues. These visits are designed to focus on prevention, allowing your provider to concentrate on managing your acute and chronic conditions during your regular appointments.  Please note that Annual Wellness Visits do not include a physical exam. Some assessments may be limited, especially if the visit was conducted virtually. If needed, we may recommend a separate in-person follow-up with your provider.  Ongoing Care Seeing your primary care provider every 3 to 6 months helps us  monitor your health and provide consistent, personalized care.   Referrals If a referral was made during today's visit and you haven't received any updates within two weeks, please contact the referred provider directly to check on the status.  Recommended Screenings:  Health Maintenance  Topic Date Due   Flu Shot  09/06/2023   COVID-19 Vaccine (3 - 2025-26 season) 10/07/2023   Pneumococcal Vaccine for age over 38 (2 of 2 - PCV) 03/10/2024*   DTaP/Tdap/Td vaccine (3 - Td or Tdap) 09/30/2024   Medicare Annual Wellness Visit  12/02/2024   Colon Cancer Screening  12/12/2029   Hepatitis C Screening  Completed   Zoster (Shingles) Vaccine  Completed   Meningitis B Vaccine  Aged Out  *Topic was postponed. The date shown is not the original due date.       01/20/2014    2:08 PM  Advanced Directives  Does Patient Have a Medical Advance Directive? No      Data saved with a previous flowsheet row definition   Advance Care Planning is important because it: Ensures you receive medical care that aligns with your values, goals, and preferences. Provides guidance  to your family and loved ones, reducing the emotional burden of decision-making during critical moments.  Vision: Annual vision screenings are recommended for early detection of glaucoma, cataracts, and diabetic retinopathy. These exams can also reveal signs of chronic conditions such as diabetes and high blood pressure.  Dental: Annual dental screenings help detect early signs of oral cancer, gum disease, and other conditions linked to overall health, including heart disease and diabetes.

## 2023-12-30 ENCOUNTER — Other Ambulatory Visit (HOSPITAL_COMMUNITY): Payer: Self-pay

## 2024-01-06 ENCOUNTER — Encounter: Payer: Self-pay | Admitting: Family Medicine

## 2024-01-06 ENCOUNTER — Ambulatory Visit: Admitting: Family Medicine

## 2024-01-06 VITALS — BP 138/78 | HR 60 | Temp 98.6°F | Ht 67.0 in | Wt 224.4 lb

## 2024-01-06 DIAGNOSIS — Z0000 Encounter for general adult medical examination without abnormal findings: Secondary | ICD-10-CM

## 2024-01-06 DIAGNOSIS — I10 Essential (primary) hypertension: Secondary | ICD-10-CM

## 2024-01-06 DIAGNOSIS — E785 Hyperlipidemia, unspecified: Secondary | ICD-10-CM | POA: Diagnosis not present

## 2024-01-06 DIAGNOSIS — Z7189 Other specified counseling: Secondary | ICD-10-CM

## 2024-01-06 DIAGNOSIS — Z23 Encounter for immunization: Secondary | ICD-10-CM

## 2024-01-06 NOTE — Progress Notes (Unsigned)
 No use of xanax  or tramadol .  Med list updated.  CAD. Hypertension. S/p ascending aorta repair. Followed by cardiology.  Hypertension:    Using medication without problems or lightheadedness: yes Chest pain with exertion:no Edema:no Short of breath: History of, occ, improved with entresto .   Prev labs d/w pt.    Elevated Cholesterol: Using medications without problems:yes Muscle aches: minimal, d/w pt.   Diet compliance: d/w pt.  Exercise: d/w pt.   Tetanus 2016 Flu encouraged.  Done 2025.   Shingles 2019 PNA 2018 covid vaccine 2021 Colonoscopy 2024 Prostate cancer screening and PSA options (with potential risks and benefits of testing vs not testing) were discussed along with recent recs/guidelines.  He declined testing PSA at this point. Living will d/w pt.  Wife designated if patient were incapacitated.     Meds, vitals, and allergies reviewed.   ROS: Per HPI unless specifically indicated in ROS section   GEN: nad, alert and oriented HEENT: ncat NECK: supple w/o LA CV: rrr.  PULM: ctab, no inc wob ABD: soft, +bs EXT: no edema SKIN: Well-perfused.

## 2024-01-06 NOTE — Patient Instructions (Signed)
Flu shot today.  Update me as needed.  Take care.  Glad to see you. 

## 2024-01-08 DIAGNOSIS — Z0000 Encounter for general adult medical examination without abnormal findings: Secondary | ICD-10-CM | POA: Insufficient documentation

## 2024-01-08 NOTE — Assessment & Plan Note (Signed)
 Tetanus 2016 Flu encouraged.  Done 2025.   Shingles 2019 PNA 2018 covid vaccine 2021 Colonoscopy 2024 Prostate cancer screening and PSA options (with potential risks and benefits of testing vs not testing) were discussed along with recent recs/guidelines.  He declined testing PSA at this point. Living will d/w pt.  Wife designated if patient were incapacitated.

## 2024-01-08 NOTE — Assessment & Plan Note (Signed)
 Continue work on diet and exercise.  Continue Lasix  metoprolol  Entresto  spironolactone .

## 2024-01-08 NOTE — Assessment & Plan Note (Signed)
 Continue work on diet and exercise.  Continue Crestor .

## 2024-01-08 NOTE — Assessment & Plan Note (Signed)
 Living will d/w pt.  Wife designated if patient were incapacitated.   ?

## 2024-04-01 ENCOUNTER — Ambulatory Visit: Admitting: Physician Assistant

## 2024-12-04 ENCOUNTER — Ambulatory Visit
